# Patient Record
Sex: Male | Born: 1935
Health system: Southern US, Community
[De-identification: ages and names within clinical notes are randomized; demographics above are authoritative.]

## PROBLEM LIST (undated history)

## (undated) DIAGNOSIS — B029 Zoster without complications: Secondary | ICD-10-CM

## (undated) DIAGNOSIS — I4891 Unspecified atrial fibrillation: Secondary | ICD-10-CM

## (undated) DIAGNOSIS — E119 Type 2 diabetes mellitus without complications: Secondary | ICD-10-CM

## (undated) DIAGNOSIS — N289 Disorder of kidney and ureter, unspecified: Secondary | ICD-10-CM

## (undated) DIAGNOSIS — E7800 Pure hypercholesterolemia, unspecified: Secondary | ICD-10-CM

## (undated) DIAGNOSIS — E11319 Type 2 diabetes mellitus with unspecified diabetic retinopathy without macular edema: Secondary | ICD-10-CM

## (undated) DIAGNOSIS — H35039 Hypertensive retinopathy, unspecified eye: Secondary | ICD-10-CM

## (undated) DIAGNOSIS — I484 Atypical atrial flutter: Secondary | ICD-10-CM

## (undated) DIAGNOSIS — Z8546 Personal history of malignant neoplasm of prostate: Secondary | ICD-10-CM

## (undated) DIAGNOSIS — I10 Essential (primary) hypertension: Secondary | ICD-10-CM

## (undated) HISTORY — DX: Hypertensive retinopathy, unspecified eye: H35.039

## (undated) HISTORY — PX: EYE SURGERY: SHX253

## (undated) HISTORY — DX: Essential (primary) hypertension: I10

## (undated) HISTORY — PX: OTHER SURGICAL HISTORY: SHX169

## (undated) HISTORY — DX: Type 2 diabetes mellitus with unspecified diabetic retinopathy without macular edema: E11.319

## (undated) HISTORY — PX: CATARACT EXTRACTION: SUR2

## (undated) HISTORY — DX: Zoster without complications: B02.9

## (undated) HISTORY — DX: Disorder of kidney and ureter, unspecified: N28.9

## (undated) HISTORY — DX: Pure hypercholesterolemia, unspecified: E78.00

## (undated) HISTORY — DX: Personal history of malignant neoplasm of prostate: Z85.46

## (undated) HISTORY — DX: Type 2 diabetes mellitus without complications: E11.9

## (undated) NOTE — *Deleted (*Deleted)
[facility] - Clinic Note  06/14/2020     CHIEF COMPLAINT Patient presents for No chief complaint on file.   HISTORY OF PRESENT ILLNESS: Marcus Johnston is a 35 y.o. male who presents to the clinic today for:   Patient states he still has floaters, but they are slowly getting better, he states he has a "string" in his vision and the color white looks tan, he states he has not been able to read any text since he had the steroid injection   Referring physician: Dorothyann Peng, MD [address] [place],  [place] [address]  HISTORICAL INFORMATION:   Selected notes from the MEDICAL RECORD NUMBER Referred by Dr. Fabian Sharp for CME OU   CURRENT MEDICATIONS: Current Outpatient Medications (Ophthalmic Drugs)  Medication Sig  . Bromfenac Sodium (PROLENSA) 0.07 % SOLN Place 1 drop into both eyes 4 (four) times daily.  . prednisoLONE acetate (PRED FORTE) 1 % ophthalmic suspension Place 1 drop into both eyes 4 (four) times daily.   No current facility-administered medications for this visit. (Ophthalmic Drugs)   Current Outpatient Medications (Other)  Medication Sig  . acetaminophen (TYLENOL) 325 MG tablet Take 650 mg by mouth every 6 (six) hours as needed for mild pain or fever.  Marland Kitchen amLODipine (NORVASC) 5 MG tablet Take 1 tablet by mouth 2 times per day  . carvedilol (COREG) 12.5 MG tablet Take 1 tablet (12.5 mg total) by mouth 2 (two) times daily with a meal.  . Cholecalciferol (VITAMIN D3) 25 MCG (1000 UT) CAPS Take 1,000 Units by mouth daily.   Marland Kitchen doxazosin (CARDURA) 2 MG tablet Take 2 mg by mouth daily. (Patient not taking: Reported on 04/26/2020)  . doxazosin (CARDURA) 4 MG tablet TAKE 1 TABLET BY MOUTH AT  BEDTIME  . Empagliflozin (JARDIANCE PO) Take 25 mg by mouth once. 1 Tablet Daily with Breakfast  . FARXIGA 5 MG TABS tablet Take 5 mg by mouth daily.  . fexofenadine (ALLEGRA) 180 MG tablet Take 180 mg by mouth daily as needed (allergies.).   Marland Kitchen  insulin lispro protamine-lispro (HUMALOG 50/50 MIX) (50-50) 100 UNIT/ML SUSP injection Inject 22-25 Units into the skin See admin instructions. Inject 25 units subcutaneously with breakfast and 22 units with supper  . Insulin Syringe-Needle U-100 (EASY TOUCH INSULIN SYRINGE) 30G X 5/16" 0.5 ML MISC USE 3 TIMES DAILY  . LANTUS 100 UNIT/ML injection Inject into the skin.  Marland Kitchen losartan (COZAAR) 50 MG tablet   . losartan-hydrochlorothiazide (HYZAAR) 100-25 MG tablet TAKE 1 TABLET BY MOUTH  DAILY  . metFORMIN (GLUCOPHAGE-XR) 500 MG 24 hr tablet Take 1,000 mg by mouth 2 (two) times daily with a meal.   . NOVOLOG 100 UNIT/ML injection Inject into the skin.  . TRULICITY 0.75 MG/0.5ML SOPN SMARTSIG:0.5 Milliliter(s) SUB-Q Once a Week   No current facility-administered medications for this visit. (Other)      REVIEW OF SYSTEMS:    ALLERGIES Allergies  Allergen Reactions  . Bidil [Isosorb Dinitrate-Hydralazine] Other (See Comments)    Severe hypotension episode after administering one dose of Bidil 20-37.5 mg on 09/25/2019.   . Statins Other (See Comments)    Leg pain    PAST MEDICAL HISTORY Past Medical History:  Diagnosis Date  . Atrial fibrillation (HCC)   . Atypical atrial flutter (HCC) 10/17/2018  . Diabetes mellitus without complication (HCC)   . Diabetic retinopathy (HCC)    NPDR OU  . High cholesterol   . History of prostate  cancer   . HTN (hypertension)   . Hypertensive retinopathy    OU  . Renal insufficiency   . Shingles    Past Surgical History:  Procedure Laterality Date  . ATRIAL TACH ABLATION N/A 05/08/2019   Procedure: ATRIAL TACH ABLATION;  Surgeon: Marinus Maw, MD;  Location: [facility];  Service: Cardiovascular;  Laterality: N/A;  . CATARACT EXTRACTION Bilateral   . EYE SURGERY Bilateral    Cat Sx  . prostate implant      FAMILY HISTORY Family History  Problem Relation Age of Onset  . Cancer Mother   . Cancer Father     SOCIAL HISTORY Social  History   Tobacco Use  . Smoking status: Never Smoker  . Smokeless tobacco: Never Used  Vaping Use  . Vaping Use: Never used  Substance Use Topics  . Alcohol use: No  . Drug use: No         OPHTHALMIC EXAM:  Not recorded     IMAGING AND PROCEDURES  Imaging and Procedures for @TODAY @           ASSESSMENT/PLAN:    ICD-10-CM   1. Moderate nonproliferative diabetic retinopathy of both eyes with macular edema associated with type 2 diabetes mellitus (HCC)  Z61.0960   2. Retinal edema  H35.81 OCT, Retina - OU - Both Eyes  3. History of retinal detachment  Z86.69   4. Essential hypertension  I10   5. Hypertensive retinopathy of both eyes  H35.033   6. Pseudophakia of both eyes  Z96.1    1,2.  Non-proliferative diabetic retinopathy w/ macular edema OU  - patient with long standing history of macular edema -- was seen by Dr. Ashley Royalty back in 2013  - has also seen Dr. Allyne Gee, Dr. Allena Katz, Retina service at [facility]  - last retina f/u in 2019 with Dr. Allena Katz and at [facility]  - pt has previously received intravitreal injections OU, but does not like them and felt like benefits from injections were minmal  - at initial visit, was using PF and ketorolac QID OU -- pt self d/c  - exam shows scattered/minimal MA but central edema OU  - FA (12.11.20) shows late leaking MA, OS shows capillary nonperfusion, OD with ?petaloid central hyperfluorescence -- ?CME component  - s/p IVA OD #1 (12.11.20)  - s/p IVE OD #1 (01.20.21), #2 (02.17.21), #3 (03.17.21), #4 (05.13.21), #5 (06.11.21), #6 (07.14.21), #7 (08.13.21)  - s/p IVE OS #1 (12.11.20), #2 (01.20.21), #3 (02.17.21), #4 (03.17.21), #5 (04.15.21), #6 (4.15.21), #7 (06.11.21), #8 (07.14.21), #9 (08.13.21), #10 (9.10.21)             - s/p IVTA OD #1 (04.15.21), #2 (9.10.21)  - s/p IVTA OS #1 (05.13.21)  - good response to IVTA previously, but most recent IVTA OD (9.10.21) caused significant floaters and increased central edema  -  BCVA OD: 20/40 (improved); OS: 20/60 (decreased)  - OCT shows OD: Mild Interval improvement in vit opacities but increased central cystic changes; OS: persistent IRF/IRHM  - recommend IVE OU (OD #8 and OS #11) today, 10.29.21  - pt wishes to defer injections today and wait for floaters to clear up before resuming injections  - Eylea4U benefits investigation initiated 12.11.20 -- approved for 2021  - Eylea informed consent form signed and scanned on 01.20.21  - IVTA informed consent form signed and scanned on 04.15.21  - cont PF QID OU and Prolensa QID OU  - f/u 2-3 weeks --  DFE/OCT/possible injection  3. History of inferior HST w/ focal RD OD s/p laser retinopexy  - HST at 0500 w/ surrounding SRF  - good barricade laser in place spanning 0430-0600  - stable  4,5. Hypertensive retinopathy OU  - discussed importance of tight BP control  - monitor   6. Pseudophakia OU  - s/p CE/IOL OU  - beautiful surgeries, doing well  - monitor   Ophthalmic Meds Ordered this visit:  No orders of the defined types were placed in this encounter.      No follow-ups on file.  There are no Patient Instructions on file for this visit.   Explained the diagnoses, plan, and follow up with the patient and they expressed understanding.  Patient expressed understanding of the importance of proper follow up care.   This document serves as a record of services personally performed by Karie Chimera, MD, PhD. It was created on their behalf by Glee Arvin. Manson Passey, OA an ophthalmic technician. The creation of this record is the provider's dictation and/or activities during the visit.    Electronically signed by: Glee Arvin. Manson Passey, [place] 10.25.2021 12:06 PM  Karie Chimera, M.D., Ph.D. Diseases & Surgery of the Retina and Vitreous [facility]    Abbreviations: M myopia (nearsighted); A astigmatism; H hyperopia (farsighted); P presbyopia; Mrx spectacle prescription;  CTL contact lenses;  OD right eye; OS left eye; OU both eyes  XT exotropia; ET esotropia; PEK punctate epithelial keratitis; PEE punctate epithelial erosions; DES dry eye syndrome; MGD meibomian gland dysfunction; ATs artificial tears; PFAT's preservative free artificial tears; NSC nuclear sclerotic cataract; PSC posterior subcapsular cataract; ERM epi-retinal membrane; PVD posterior vitreous detachment; RD retinal detachment; DM diabetes mellitus; DR diabetic retinopathy; NPDR non-proliferative diabetic retinopathy; PDR proliferative diabetic retinopathy; CSME clinically significant macular edema; DME diabetic macular edema; dbh dot blot hemorrhages; CWS cotton wool spot; POAG primary open angle glaucoma; C/D cup-to-disc ratio; HVF humphrey visual field; GVF goldmann visual field; OCT optical coherence tomography; IOP intraocular pressure; BRVO Branch retinal vein occlusion; CRVO central retinal vein occlusion; CRAO central retinal artery occlusion; BRAO branch retinal artery occlusion; RT retinal tear; SB scleral buckle; PPV pars plana vitrectomy; VH Vitreous hemorrhage; PRP panretinal laser photocoagulation; IVK intravitreal kenalog; VMT vitreomacular traction; MH Macular hole;  NVD neovascularization of the disc; NVE neovascularization elsewhere; AREDS age related eye disease study; ARMD age related macular degeneration; POAG primary open angle glaucoma; EBMD epithelial/anterior basement membrane dystrophy; ACIOL anterior chamber intraocular lens; IOL intraocular lens; PCIOL posterior chamber intraocular lens; Phaco/IOL phacoemulsification with intraocular lens placement; PRK photorefractive keratectomy; LASIK laser assisted in situ keratomileusis; HTN hypertension; DM diabetes mellitus; COPD chronic obstructive pulmonary disease

---
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
## 2005-06-24 ENCOUNTER — Encounter: Admission: RE | Admit: 2005-06-24 | Discharge: 2005-06-24 | Payer: Self-pay | Admitting: Orthopedic Surgery

## 2005-06-29 ENCOUNTER — Ambulatory Visit (HOSPITAL_BASED_OUTPATIENT_CLINIC_OR_DEPARTMENT_OTHER): Admission: RE | Admit: 2005-06-29 | Discharge: 2005-06-29 | Payer: Self-pay | Admitting: Orthopedic Surgery

## 2005-06-29 ENCOUNTER — Ambulatory Visit (HOSPITAL_COMMUNITY): Admission: RE | Admit: 2005-06-29 | Discharge: 2005-06-29 | Payer: Self-pay | Admitting: Orthopedic Surgery

## 2007-10-11 ENCOUNTER — Encounter: Admission: RE | Admit: 2007-10-11 | Discharge: 2007-10-11 | Payer: Self-pay | Admitting: Internal Medicine

## 2011-01-02 NOTE — Op Note (Signed)
Marcus Johnston, Marcus Johnston              ACCOUNT NO.:  [id number]   MEDICAL RECORD NO.:  [id number]          PATIENT TYPE:  AMB   LOCATION:  [facility]                          FACILITY:  [facility]   PHYSICIAN:  Feliberto Gottron. Turner Daniels, M.D.   DATE OF BIRTH:  08-18-1930   DATE OF PROCEDURE:  06/29/2005  DATE OF DISCHARGE:                                 OPERATIVE REPORT   PREOPERATIVE DIAGNOSES:  Right knee medial meniscal tear, possible  chondromalacia.   POSTOPERATIVE DIAGNOSES:  Right knee posterior horn, parrot-beak medial  meniscal tear, chondromalacia patella grade 3, medial femoral condyle grade  3 and cartilaginous loose bodies.   OPERATION PERFORMED:  Right knee arthroscopic debridement of medial meniscal  tear, chondromalacia patella, chondromalacia medial femoral condyle and  removal of loose bodies.   SURGEON:  Feliberto Gottron. Turner Daniels, M.D.   ASSISTANTLaural Benes. Jannet Mantis.   ANESTHESIA:  General LMA.   ESTIMATED BLOOD LOSS:  Minimal.   FLUIDS REPLACED:  800 mL crystalloids.   DRAINS:  None.   TOURNIQUET TIME:  None.   INDICATIONS FOR PROCEDURE:  The patient is a 77 year old man with catching,  popping and pain in his right knee consistent with medial meniscal tear and  of course possible chondromalacia in his age group.  He has failed  conservative treatment with observation, anti-inflammatory medicines, and  exercises and desires elective arthroscopic evaluation and treatment of his  right knee.  Plain radiographs show loss of only 1 or 2 mm of the medial  cartilage and risks and benefits of surgery discussed with him, all  questions answered preoperatively.   DESCRIPTION OF PROCEDURE:  The patient was identified by arm band and taken  to the operating room at [facility] where the appropriate  anesthetic monitors were attached.  General LMA anesthesia was induced with  the patient in the supine position.  Lateral post applied to the table and  the right lower  extremity prepped and draped in the usual sterile fashion  from the ankle to the mid thigh.  Using an 11 blade, standard inferomedial  and inferolateral peripatellar portals were then made allowing introduction  of the arthroscope through the inferolateral portal and the outflow through  the inferomedial portal.  Diagnostic arthroscopy revealed chondromalacia of  the patella, grade 3 which was debrided back to a stable margin with a 3.5  Gator sucker shaver along the apex.  Going to the medial compartment, parrot-  beak tear of the posterior horn of the medial meniscus was identified along  with a horizontal cleavage tear and this was debrided back to a stable  margin using the straight biters and a 3.5 Gator sucker shaver.  Grade 3  chondromalacia of the lateral aspect of the medial femoral condyle  anteriorly and posteriorly was also debrided back to stable margin with flap  tears.  The ACL and PCL were intact, the lateral compartment was in  excellent condition. The gutters were cleared medially and laterally and the  scope taken to the medial side of the PCL clearing the posterior compartment  as well.  Multiple cartilaginous loose bodies were encountered from the  chondromalacia donor sites during the procedure and taken out primarily with  the outflow although some required use of a biter or grasper to remove.  The  knee was then irrigated out with normal saline solution.  The arthroscopic  instruments removed and a dressing of Xeroform, 4 x 4 dressing sponges,  Webril and Ace wrap was applied.  The patient was awakened and taken to the  recovery room without difficulty.      Feliberto Gottron. Turner Daniels, M.D.  Electronically Signed     FJR/MEDQ  D:  06/29/2005  T:  06/30/2005  Job:  [id number]

## 2011-11-26 ENCOUNTER — Ambulatory Visit (INDEPENDENT_AMBULATORY_CARE_PROVIDER_SITE_OTHER): Payer: Medicare Other | Admitting: Ophthalmology

## 2011-11-26 DIAGNOSIS — H43819 Vitreous degeneration, unspecified eye: Secondary | ICD-10-CM

## 2011-11-26 DIAGNOSIS — E11319 Type 2 diabetes mellitus with unspecified diabetic retinopathy without macular edema: Secondary | ICD-10-CM

## 2011-11-26 DIAGNOSIS — E1139 Type 2 diabetes mellitus with other diabetic ophthalmic complication: Secondary | ICD-10-CM

## 2011-11-26 DIAGNOSIS — H2510 Age-related nuclear cataract, unspecified eye: Secondary | ICD-10-CM

## 2011-12-03 ENCOUNTER — Encounter (INDEPENDENT_AMBULATORY_CARE_PROVIDER_SITE_OTHER): Payer: Medicare Other | Admitting: Ophthalmology

## 2013-11-14 ENCOUNTER — Ambulatory Visit: Payer: Self-pay

## 2013-11-14 ENCOUNTER — Ambulatory Visit (INDEPENDENT_AMBULATORY_CARE_PROVIDER_SITE_OTHER): Payer: Medicare Other

## 2013-11-14 VITALS — BP 162/79 | HR 70 | Resp 12

## 2013-11-14 DIAGNOSIS — E1142 Type 2 diabetes mellitus with diabetic polyneuropathy: Secondary | ICD-10-CM

## 2013-11-14 DIAGNOSIS — E1149 Type 2 diabetes mellitus with other diabetic neurological complication: Secondary | ICD-10-CM

## 2013-11-14 DIAGNOSIS — E1140 Type 2 diabetes mellitus with diabetic neuropathy, unspecified: Secondary | ICD-10-CM

## 2013-11-14 DIAGNOSIS — B351 Tinea unguium: Secondary | ICD-10-CM

## 2013-11-14 DIAGNOSIS — B353 Tinea pedis: Secondary | ICD-10-CM

## 2013-11-14 DIAGNOSIS — M79609 Pain in unspecified limb: Secondary | ICD-10-CM

## 2013-11-14 NOTE — Progress Notes (Signed)
   Subjective:    Patient ID: Marcus Johnston, male    DOB: 11-May-1931, 80 y.o.   MRN: [id number]  HPI  TOENAILS NEE TO BE TRIM. ALSO CHECK DIABETIC FEET AND NO PAIN.   Review of Systems  All other systems reviewed and are negative.       Objective:   Physical Exam Vascular status is intact with pedal pulses palpable DP postal for PT plus one over 4 bilateral capillary refill time 3 seconds all digits skin temperature warm turgor normal there is no edema noted no rubor pallor or varicosities noted neurologic epicritic and proprioceptive sensations are intact although diminished decreased sensation plantar arch and rear foot into the plantar first MTP area there is intact sensation of toes and dorsum of the foot. There is normal plantar response DTRs not elicited dermatologically skin color pigment normal hair growth absent nails criptotic incurvated and friable with slight yellowing discoloration and proptosis. There is also some mild interdigital fissuring and maceration patient does have a complaint of perspiration is be staying wet and hyperhidrosis. No secondary infections no ascending psoas lymphangitis no open wounds or ulcerations noted. Orthopedic biomechanical exam reveals rectus foot type mild digital contractures noted hallux is rectus Last A1c was just under 9 they're changing his medications and hopefully can have a better A1c in the future.      Assessment & Plan:  Assessment this time his diabetes with early peripheral neuropathy and some onychomycosis and proptosis of nails as well as tinea pedis plan at this time mycotic nails are debrided and the presence of diabetes cocking factors and some pain in symptomology also this time dispensed samples of Luzu topical antifungal is dispensed proxy 30 tumors or dispensed for patient applied once daily to the affected interspace areas both feet for Lisa to 3 week duration. Also suggested topical antiperspirant spray can spread his feet  up reduce perspiration stop using nylon socks is cotton or Kerlix socks. Dispensed literature and diabetic foot care management at this time nails are trimmed antifungal dispensed reappointed 3 months for continued palliative care in the future. Next  Harriet Masson DPM

## 2013-11-14 NOTE — Patient Instructions (Signed)
Diabetes and Foot Care Diabetes may cause you to have problems because of poor blood supply (circulation) to your feet and legs. This may cause the skin on your feet to become thinner, break easier, and heal more slowly. Your skin may become dry, and the skin may peel and crack. You may also have nerve damage in your legs and feet causing decreased feeling in them. You may not notice minor injuries to your feet that could lead to infections or more serious problems. Taking care of your feet is one of the most important things you can do for yourself.  HOME CARE INSTRUCTIONS  Wear shoes at all times, even in the house. Do not go barefoot. Bare feet are easily injured.  Check your feet daily for blisters, cuts, and redness. If you cannot see the bottom of your feet, use a mirror or ask someone for help.  Wash your feet with warm water (do not use hot water) and mild soap. Then pat your feet and the areas between your toes until they are completely dry. Do not soak your feet as this can dry your skin.  Apply a moisturizing lotion or petroleum jelly (that does not contain alcohol and is unscented) to the skin on your feet and to dry, brittle toenails. Do not apply lotion between your toes.  Trim your toenails straight across. Do not dig under them or around the cuticle. File the edges of your nails with an emery board or nail file.  Do not cut corns or calluses or try to remove them with medicine.  Wear clean socks or stockings every day. Make sure they are not too tight. Do not wear knee-high stockings since they may decrease blood flow to your legs.  Wear shoes that fit properly and have enough cushioning. To break in new shoes, wear them for just a few hours a day. This prevents you from injuring your feet. Always look in your shoes before you put them on to be sure there are no objects inside.  Do not cross your legs. This may decrease the blood flow to your feet.  If you find a minor scrape,  cut, or break in the skin on your feet, keep it and the skin around it clean and dry. These areas may be cleansed with mild soap and water. Do not cleanse the area with peroxide, alcohol, or iodine.  When you remove an adhesive bandage, be sure not to damage the skin around it.  If you have a wound, look at it several times a day to make sure it is healing.  Do not use heating pads or hot water bottles. They may burn your skin. If you have lost feeling in your feet or legs, you may not know it is happening until it is too late.  Make sure your health care provider performs a complete foot exam at least annually or more often if you have foot problems. Report any cuts, sores, or bruises to your health care provider immediately. SEEK MEDICAL CARE IF:   You have an injury that is not healing.  You have cuts or breaks in the skin.  You have an ingrown nail.  You notice redness on your legs or feet.  You feel burning or tingling in your legs or feet.  You have pain or cramps in your legs and feet.  Your legs or feet are numb.  Your feet always feel cold. SEEK IMMEDIATE MEDICAL CARE IF:   There is increasing redness,   swelling, or pain in or around a wound.  There is a red line that goes up your leg.  Pus is coming from a wound.  You develop a fever or as directed by your health care provider.  You notice a bad smell coming from an ulcer or wound. Document Released: 07/31/2000 Document Revised: 04/05/2013 Document Reviewed: 01/10/2013 ExitCare Patient Information 2014 [employer].  

## 2014-02-13 ENCOUNTER — Ambulatory Visit (INDEPENDENT_AMBULATORY_CARE_PROVIDER_SITE_OTHER): Payer: Medicare Other

## 2014-02-13 VITALS — BP 124/71 | HR 71 | Resp 16

## 2014-02-13 DIAGNOSIS — E1149 Type 2 diabetes mellitus with other diabetic neurological complication: Secondary | ICD-10-CM

## 2014-02-13 DIAGNOSIS — E1140 Type 2 diabetes mellitus with diabetic neuropathy, unspecified: Secondary | ICD-10-CM

## 2014-02-13 DIAGNOSIS — B353 Tinea pedis: Secondary | ICD-10-CM

## 2014-02-13 DIAGNOSIS — B351 Tinea unguium: Secondary | ICD-10-CM

## 2014-02-13 DIAGNOSIS — E1142 Type 2 diabetes mellitus with diabetic polyneuropathy: Secondary | ICD-10-CM

## 2014-02-13 NOTE — Patient Instructions (Signed)
Diabetes and Foot Care Diabetes may cause you to have problems because of poor blood supply (circulation) to your feet and legs. This may cause the skin on your feet to become thinner, break easier, and heal more slowly. Your skin may become dry, and the skin may peel and crack. You may also have nerve damage in your legs and feet causing decreased feeling in them. You may not notice minor injuries to your feet that could lead to infections or more serious problems. Taking care of your feet is one of the most important things you can do for yourself.  HOME CARE INSTRUCTIONS  Wear shoes at all times, even in the house. Do not go barefoot. Bare feet are easily injured.  Check your feet daily for blisters, cuts, and redness. If you cannot see the bottom of your feet, use a mirror or ask someone for help.  Wash your feet with warm water (do not use hot water) and mild soap. Then pat your feet and the areas between your toes until they are completely dry. Do not soak your feet as this can dry your skin.  Apply a moisturizing lotion or petroleum jelly (that does not contain alcohol and is unscented) to the skin on your feet and to dry, brittle toenails. Do not apply lotion between your toes.  Trim your toenails straight across. Do not dig under them or around the cuticle. File the edges of your nails with an emery board or nail file.  Do not cut corns or calluses or try to remove them with medicine.  Wear clean socks or stockings every day. Make sure they are not too tight. Do not wear knee-high stockings since they may decrease blood flow to your legs.  Wear shoes that fit properly and have enough cushioning. To break in new shoes, wear them for just a few hours a day. This prevents you from injuring your feet. Always look in your shoes before you put them on to be sure there are no objects inside.  Do not cross your legs. This may decrease the blood flow to your feet.  If you find a minor scrape,  cut, or break in the skin on your feet, keep it and the skin around it clean and dry. These areas may be cleansed with mild soap and water. Do not cleanse the area with peroxide, alcohol, or iodine.  When you remove an adhesive bandage, be sure not to damage the skin around it.  If you have a wound, look at it several times a day to make sure it is healing.  Do not use heating pads or hot water bottles. They may burn your skin. If you have lost feeling in your feet or legs, you may not know it is happening until it is too late.  Make sure your health care provider performs a complete foot exam at least annually or more often if you have foot problems. Report any cuts, sores, or bruises to your health care provider immediately. SEEK MEDICAL CARE IF:   You have an injury that is not healing.  You have cuts or breaks in the skin.  You have an ingrown nail.  You notice redness on your legs or feet.  You feel burning or tingling in your legs or feet.  You have pain or cramps in your legs and feet.  Your legs or feet are numb.  Your feet always feel cold. SEEK IMMEDIATE MEDICAL CARE IF:   There is increasing redness,   swelling, or pain in or around a wound.  There is a red line that goes up your leg.  Pus is coming from a wound.  You develop a fever or as directed by your health care provider.  You notice a bad smell coming from an ulcer or wound. Document Released: 07/31/2000 Document Revised: 04/05/2013 Document Reviewed: 01/10/2013 ExitCare Patient Information 2015 [employer]. This information is not intended to replace advice given to you by your health care provider. Make sure you discuss any questions you have with your health care provider.  

## 2014-02-13 NOTE — Progress Notes (Signed)
   Subjective:    Patient ID: Marcus Johnston, male    DOB: 06/16/1931, 80 y.o.   MRN: [id number]  HPI Comments: "I don't want my nails trimmed today, just want them checked please"  Last A1C was 8.2  Diabetes      Review of Systems no new findings or systemic changes noted     Objective:   Physical Exam 80 year old [place] male process this time for followup diabetic foot evaluation patient Marcus Johnston stable cut his own nails Nail care treatment at this time using the topical antifungal for the interdigital maceration and has fungal infection appears to be improved there is no open wounds ulcerations noted patient's pulses follow DP postal for bilateral PT one over 4 bilateral capillary refill timed somewhat decreased 3-4 seconds epicritic and proprioceptive sensations diminished on Semmes Weinstein testing to forefoot and digits but 3 sensations also noted to be decreased. Mild flexible digital contractures noted no open wounds ulcerations no other complications noted current time.       Assessment & Plan:  Assessment diabetes with mild peripheral neuropathy no other consultations or factors noted patient has elected to do self-care debridement of nails suggested future followup in 6-12 months for diabetic foot you've reevaluation and neuropathy check maintain good appropriate accommodative shoes at all times. Followup there's any new problems exacerbations ulcer or changes in his status next  Marcus Johnston DPM

## 2015-02-12 ENCOUNTER — Ambulatory Visit: Payer: Medicare Other

## 2015-02-12 ENCOUNTER — Ambulatory Visit: Payer: Self-pay | Admitting: Podiatry

## 2015-02-19 ENCOUNTER — Ambulatory Visit (INDEPENDENT_AMBULATORY_CARE_PROVIDER_SITE_OTHER): Payer: Commercial Managed Care - HMO | Admitting: Podiatry

## 2015-03-26 ENCOUNTER — Ambulatory Visit (INDEPENDENT_AMBULATORY_CARE_PROVIDER_SITE_OTHER): Payer: Commercial Managed Care - HMO | Admitting: Podiatry

## 2015-03-26 ENCOUNTER — Encounter: Payer: Self-pay | Admitting: Podiatry

## 2015-03-26 DIAGNOSIS — M79676 Pain in unspecified toe(s): Secondary | ICD-10-CM | POA: Diagnosis not present

## 2015-03-26 DIAGNOSIS — B351 Tinea unguium: Secondary | ICD-10-CM | POA: Diagnosis not present

## 2015-03-26 NOTE — Progress Notes (Signed)
Patient ID: Marcus Johnston, male   DOB: 1930-12-24, 81 y.o.   MRN: [id number] Complaint:  Visit Type: Patient returns to my office for continued preventative foot care services. Complaint: Patient states" my nails have grown long and thick and become painful to walk and wear shoes" Patient has been diagnosed with DM with no foot complications. The patient presents for preventative foot care services. No changes to ROS  Podiatric Exam: Vascular: dorsalis pedis and posterior tibial pulses are palpable bilateral. Capillary return is immediate. Temperature gradient is WNL. Skin turgor WNL  Sensorium: Normal Semmes Weinstein monofilament test. Normal tactile sensation bilaterally. Nail Exam: Pt has thick disfigured discolored nails with subungual debris noted bilateral entire nail hallux through fifth toenails Ulcer Exam: There is no evidence of ulcer or pre-ulcerative changes or infection. Orthopedic Exam: Muscle tone and strength are WNL. No limitations in general ROM. No crepitus or effusions noted. Foot type and digits show no abnormalities. Bony prominences are unremarkable. Skin: No Porokeratosis. No infection or ulcers  Diagnosis:  Onychomycosis, , Pain in right toe, pain in left toes  Treatment & Plan Procedures and Treatment: Consent by patient was obtained for treatment procedures. The patient understood the discussion of treatment and procedures well. All questions were answered thoroughly reviewed. Debridement of mycotic and hypertrophic toenails, 1 through 5 bilateral and clearing of subungual debris. No ulceration, no infection noted.  Return Visit-Office Procedure: Patient instructed to return to the office for a follow up visit 3 months for continued evaluation and treatment.

## 2015-08-28 DIAGNOSIS — I129 Hypertensive chronic kidney disease with stage 1 through stage 4 chronic kidney disease, or unspecified chronic kidney disease: Secondary | ICD-10-CM | POA: Diagnosis not present

## 2015-08-28 DIAGNOSIS — N182 Chronic kidney disease, stage 2 (mild): Secondary | ICD-10-CM | POA: Diagnosis not present

## 2015-08-28 DIAGNOSIS — R0602 Shortness of breath: Secondary | ICD-10-CM | POA: Diagnosis not present

## 2015-08-28 DIAGNOSIS — R05 Cough: Secondary | ICD-10-CM | POA: Diagnosis not present

## 2015-09-13 DIAGNOSIS — J22 Unspecified acute lower respiratory infection: Secondary | ICD-10-CM | POA: Diagnosis not present

## 2015-09-13 DIAGNOSIS — N182 Chronic kidney disease, stage 2 (mild): Secondary | ICD-10-CM | POA: Diagnosis not present

## 2015-09-13 DIAGNOSIS — I129 Hypertensive chronic kidney disease with stage 1 through stage 4 chronic kidney disease, or unspecified chronic kidney disease: Secondary | ICD-10-CM | POA: Diagnosis not present

## 2015-09-18 DIAGNOSIS — E1165 Type 2 diabetes mellitus with hyperglycemia: Secondary | ICD-10-CM | POA: Diagnosis not present

## 2015-09-30 DIAGNOSIS — N182 Chronic kidney disease, stage 2 (mild): Secondary | ICD-10-CM | POA: Diagnosis not present

## 2015-09-30 DIAGNOSIS — I129 Hypertensive chronic kidney disease with stage 1 through stage 4 chronic kidney disease, or unspecified chronic kidney disease: Secondary | ICD-10-CM | POA: Diagnosis not present

## 2015-09-30 DIAGNOSIS — Z79899 Other long term (current) drug therapy: Secondary | ICD-10-CM | POA: Diagnosis not present

## 2015-09-30 DIAGNOSIS — J309 Allergic rhinitis, unspecified: Secondary | ICD-10-CM | POA: Diagnosis not present

## 2015-11-06 DIAGNOSIS — Z5181 Encounter for therapeutic drug level monitoring: Secondary | ICD-10-CM | POA: Diagnosis not present

## 2015-11-06 DIAGNOSIS — Z79899 Other long term (current) drug therapy: Secondary | ICD-10-CM | POA: Diagnosis not present

## 2015-11-06 DIAGNOSIS — E1122 Type 2 diabetes mellitus with diabetic chronic kidney disease: Secondary | ICD-10-CM | POA: Diagnosis not present

## 2015-11-06 DIAGNOSIS — E1165 Type 2 diabetes mellitus with hyperglycemia: Secondary | ICD-10-CM | POA: Diagnosis not present

## 2015-11-06 DIAGNOSIS — N183 Chronic kidney disease, stage 3 (moderate): Secondary | ICD-10-CM | POA: Diagnosis not present

## 2015-11-06 DIAGNOSIS — Z794 Long term (current) use of insulin: Secondary | ICD-10-CM | POA: Diagnosis not present

## 2015-11-26 DIAGNOSIS — Z0000 Encounter for general adult medical examination without abnormal findings: Secondary | ICD-10-CM | POA: Diagnosis not present

## 2015-11-26 DIAGNOSIS — N08 Glomerular disorders in diseases classified elsewhere: Secondary | ICD-10-CM | POA: Diagnosis not present

## 2015-11-26 DIAGNOSIS — E1122 Type 2 diabetes mellitus with diabetic chronic kidney disease: Secondary | ICD-10-CM | POA: Diagnosis not present

## 2015-11-26 DIAGNOSIS — N182 Chronic kidney disease, stage 2 (mild): Secondary | ICD-10-CM | POA: Diagnosis not present

## 2015-11-26 DIAGNOSIS — I129 Hypertensive chronic kidney disease with stage 1 through stage 4 chronic kidney disease, or unspecified chronic kidney disease: Secondary | ICD-10-CM | POA: Diagnosis not present

## 2015-12-09 DIAGNOSIS — E113212 Type 2 diabetes mellitus with mild nonproliferative diabetic retinopathy with macular edema, left eye: Secondary | ICD-10-CM | POA: Diagnosis not present

## 2015-12-11 DIAGNOSIS — E1165 Type 2 diabetes mellitus with hyperglycemia: Secondary | ICD-10-CM | POA: Diagnosis not present

## 2016-01-06 DIAGNOSIS — C61 Malignant neoplasm of prostate: Secondary | ICD-10-CM | POA: Diagnosis not present

## 2016-01-06 DIAGNOSIS — Z0000 Encounter for general adult medical examination without abnormal findings: Secondary | ICD-10-CM | POA: Diagnosis not present

## 2016-01-06 DIAGNOSIS — E113212 Type 2 diabetes mellitus with mild nonproliferative diabetic retinopathy with macular edema, left eye: Secondary | ICD-10-CM | POA: Diagnosis not present

## 2016-02-10 DIAGNOSIS — E113212 Type 2 diabetes mellitus with mild nonproliferative diabetic retinopathy with macular edema, left eye: Secondary | ICD-10-CM | POA: Diagnosis not present

## 2016-03-11 DIAGNOSIS — E1165 Type 2 diabetes mellitus with hyperglycemia: Secondary | ICD-10-CM | POA: Diagnosis not present

## 2016-03-23 DIAGNOSIS — E113212 Type 2 diabetes mellitus with mild nonproliferative diabetic retinopathy with macular edema, left eye: Secondary | ICD-10-CM | POA: Diagnosis not present

## 2016-05-04 DIAGNOSIS — E113212 Type 2 diabetes mellitus with mild nonproliferative diabetic retinopathy with macular edema, left eye: Secondary | ICD-10-CM | POA: Diagnosis not present

## 2016-05-12 DIAGNOSIS — Z23 Encounter for immunization: Secondary | ICD-10-CM | POA: Diagnosis not present

## 2016-05-12 DIAGNOSIS — Z5181 Encounter for therapeutic drug level monitoring: Secondary | ICD-10-CM | POA: Diagnosis not present

## 2016-05-12 DIAGNOSIS — N183 Chronic kidney disease, stage 3 (moderate): Secondary | ICD-10-CM | POA: Diagnosis not present

## 2016-05-12 DIAGNOSIS — Z794 Long term (current) use of insulin: Secondary | ICD-10-CM | POA: Diagnosis not present

## 2016-05-12 DIAGNOSIS — E1165 Type 2 diabetes mellitus with hyperglycemia: Secondary | ICD-10-CM | POA: Diagnosis not present

## 2016-05-12 DIAGNOSIS — E1122 Type 2 diabetes mellitus with diabetic chronic kidney disease: Secondary | ICD-10-CM | POA: Diagnosis not present

## 2016-05-18 DIAGNOSIS — N182 Chronic kidney disease, stage 2 (mild): Secondary | ICD-10-CM | POA: Diagnosis not present

## 2016-05-18 DIAGNOSIS — J309 Allergic rhinitis, unspecified: Secondary | ICD-10-CM | POA: Diagnosis not present

## 2016-05-18 DIAGNOSIS — Z79899 Other long term (current) drug therapy: Secondary | ICD-10-CM | POA: Diagnosis not present

## 2016-05-18 DIAGNOSIS — I129 Hypertensive chronic kidney disease with stage 1 through stage 4 chronic kidney disease, or unspecified chronic kidney disease: Secondary | ICD-10-CM | POA: Diagnosis not present

## 2016-06-08 DIAGNOSIS — E113212 Type 2 diabetes mellitus with mild nonproliferative diabetic retinopathy with macular edema, left eye: Secondary | ICD-10-CM | POA: Diagnosis not present

## 2016-06-11 DIAGNOSIS — E1165 Type 2 diabetes mellitus with hyperglycemia: Secondary | ICD-10-CM | POA: Diagnosis not present

## 2016-09-08 DIAGNOSIS — Z79899 Other long term (current) drug therapy: Secondary | ICD-10-CM | POA: Diagnosis not present

## 2016-09-08 DIAGNOSIS — Z794 Long term (current) use of insulin: Secondary | ICD-10-CM | POA: Diagnosis not present

## 2016-09-08 DIAGNOSIS — N183 Chronic kidney disease, stage 3 (moderate): Secondary | ICD-10-CM | POA: Diagnosis not present

## 2016-09-08 DIAGNOSIS — E1122 Type 2 diabetes mellitus with diabetic chronic kidney disease: Secondary | ICD-10-CM | POA: Diagnosis not present

## 2016-09-11 DIAGNOSIS — E1165 Type 2 diabetes mellitus with hyperglycemia: Secondary | ICD-10-CM | POA: Diagnosis not present

## 2016-10-19 DIAGNOSIS — N183 Chronic kidney disease, stage 3 (moderate): Secondary | ICD-10-CM | POA: Diagnosis not present

## 2016-10-19 DIAGNOSIS — R42 Dizziness and giddiness: Secondary | ICD-10-CM | POA: Diagnosis not present

## 2016-10-19 DIAGNOSIS — Z5181 Encounter for therapeutic drug level monitoring: Secondary | ICD-10-CM | POA: Diagnosis not present

## 2016-10-19 DIAGNOSIS — Z794 Long term (current) use of insulin: Secondary | ICD-10-CM | POA: Diagnosis not present

## 2016-10-19 DIAGNOSIS — E1122 Type 2 diabetes mellitus with diabetic chronic kidney disease: Secondary | ICD-10-CM | POA: Diagnosis not present

## 2016-11-11 DIAGNOSIS — N183 Chronic kidney disease, stage 3 (moderate): Secondary | ICD-10-CM | POA: Diagnosis not present

## 2016-11-11 DIAGNOSIS — E1122 Type 2 diabetes mellitus with diabetic chronic kidney disease: Secondary | ICD-10-CM | POA: Diagnosis not present

## 2016-11-11 DIAGNOSIS — Z794 Long term (current) use of insulin: Secondary | ICD-10-CM | POA: Diagnosis not present

## 2016-11-11 DIAGNOSIS — I10 Essential (primary) hypertension: Secondary | ICD-10-CM | POA: Diagnosis not present

## 2016-11-11 DIAGNOSIS — Z5181 Encounter for therapeutic drug level monitoring: Secondary | ICD-10-CM | POA: Diagnosis not present

## 2016-11-12 ENCOUNTER — Encounter (HOSPITAL_COMMUNITY): Payer: Self-pay

## 2016-11-12 ENCOUNTER — Observation Stay (HOSPITAL_COMMUNITY)
Admission: EM | Admit: 2016-11-12 | Discharge: 2016-11-14 | Disposition: A | Discharge status: Home / Self Care | Payer: Medicare Other | Attending: Internal Medicine | Admitting: Internal Medicine

## 2016-11-12 ENCOUNTER — Emergency Department (HOSPITAL_COMMUNITY): Payer: Medicare Other

## 2016-11-12 ENCOUNTER — Observation Stay (HOSPITAL_COMMUNITY): Payer: Medicare Other

## 2016-11-12 DIAGNOSIS — Z7982 Long term (current) use of aspirin: Secondary | ICD-10-CM | POA: Insufficient documentation

## 2016-11-12 DIAGNOSIS — Z79899 Other long term (current) drug therapy: Secondary | ICD-10-CM | POA: Diagnosis not present

## 2016-11-12 DIAGNOSIS — E785 Hyperlipidemia, unspecified: Secondary | ICD-10-CM | POA: Diagnosis not present

## 2016-11-12 DIAGNOSIS — I10 Essential (primary) hypertension: Secondary | ICD-10-CM

## 2016-11-12 DIAGNOSIS — R51 Headache: Secondary | ICD-10-CM | POA: Diagnosis not present

## 2016-11-12 DIAGNOSIS — Z794 Long term (current) use of insulin: Secondary | ICD-10-CM | POA: Insufficient documentation

## 2016-11-12 DIAGNOSIS — G459 Transient cerebral ischemic attack, unspecified: Secondary | ICD-10-CM | POA: Diagnosis not present

## 2016-11-12 DIAGNOSIS — G458 Other transient cerebral ischemic attacks and related syndromes: Secondary | ICD-10-CM

## 2016-11-12 DIAGNOSIS — Z888 Allergy status to other drugs, medicaments and biological substances status: Secondary | ICD-10-CM | POA: Insufficient documentation

## 2016-11-12 DIAGNOSIS — E119 Type 2 diabetes mellitus without complications: Secondary | ICD-10-CM

## 2016-11-12 DIAGNOSIS — E1165 Type 2 diabetes mellitus with hyperglycemia: Secondary | ICD-10-CM | POA: Insufficient documentation

## 2016-11-12 DIAGNOSIS — R29810 Facial weakness: Secondary | ICD-10-CM | POA: Diagnosis not present

## 2016-11-12 DIAGNOSIS — N289 Disorder of kidney and ureter, unspecified: Secondary | ICD-10-CM | POA: Diagnosis not present

## 2016-11-12 DIAGNOSIS — R4781 Slurred speech: Secondary | ICD-10-CM | POA: Diagnosis not present

## 2016-11-12 DIAGNOSIS — R739 Hyperglycemia, unspecified: Secondary | ICD-10-CM

## 2016-11-12 LAB — CREATININE, SERUM
CREATININE: 1.13 mg/dL (ref 0.61–1.24)
GFR calc Af Amer: >60 mL/min (ref >60)
GFR, EST NON AFRICAN AMERICAN: 58 mL/min — AB (ref >60)

## 2016-11-12 LAB — I-STAT CHEM 8, ED
BUN: 21 mg/dL — ABNORMAL HIGH (ref 6–20)
CHLORIDE: 103 mmol/L (ref 101–111)
Calcium, Ion: 1.17 mmol/L (ref 1.15–1.40)
Creatinine, Ser: 1.4 mg/dL — ABNORMAL HIGH (ref 0.61–1.24)
Glucose, Bld: 179 mg/dL — ABNORMAL HIGH (ref 65–99)
HCT: 42 % (ref 39.0–52.0)
Hemoglobin: 14.3 g/dL (ref 13.0–17.0)
Potassium: 4 mmol/L (ref 3.5–5.1)
SODIUM: 143 mmol/L (ref 135–145)
TCO2: 30 mmol/L (ref 0–100)

## 2016-11-12 LAB — PROTIME-INR
INR: 0.96
Prothrombin Time: 12.8 seconds (ref 11.4–15.2)

## 2016-11-12 LAB — APTT: aPTT: 30 seconds (ref 24–36)

## 2016-11-12 LAB — I-STAT TROPONIN, ED: Troponin i, poc: 0 ng/mL (ref 0.00–0.08)

## 2016-11-12 LAB — GLUCOSE, CAPILLARY: Glucose-Capillary: 203 mg/dL — ABNORMAL HIGH (ref 65–99)

## 2016-11-12 MED ORDER — LOSARTAN POTASSIUM 50 MG PO TABS
100.0000 mg | ORAL_TABLET | Freq: Every day | ORAL | Status: DC
  Administered 2016-11-12 – 2016-11-14 (×3): 100 mg via ORAL
  Filled 2016-11-12 (×3): qty 2

## 2016-11-12 MED ORDER — POLYETHYLENE GLYCOL 3350 17 G PO PACK: 17.0000 g | PACK | Freq: Every day | ORAL | Status: DC | PRN

## 2016-11-12 MED ORDER — INSULIN ASPART PROT & ASPART (70-30 MIX) 100 UNIT/ML ~~LOC~~ SUSP
24.0000 [IU] | Freq: Two times a day (BID) | SUBCUTANEOUS | Status: DC
  Administered 2016-11-12 – 2016-11-14 (×4): 24 [IU] via SUBCUTANEOUS
  Filled 2016-11-12: qty 10

## 2016-11-12 MED ORDER — AMLODIPINE BESYLATE 5 MG PO TABS
5.0000 mg | ORAL_TABLET | Freq: Every day | ORAL | Status: DC
  Administered 2016-11-12 – 2016-11-14 (×3): 5 mg via ORAL
  Filled 2016-11-12 (×3): qty 1

## 2016-11-12 MED ORDER — VITAMIN D 1000 UNITS PO TABS
1000.0000 [IU] | ORAL_TABLET | Freq: Every day | ORAL | Status: DC
  Administered 2016-11-12 – 2016-11-14 (×2): 1000 [IU] via ORAL
  Filled 2016-11-12 (×3): qty 1

## 2016-11-12 MED ORDER — ONDANSETRON HCL 4 MG PO TABS: 4.0000 mg | ORAL_TABLET | Freq: Four times a day (QID) | ORAL | Status: DC | PRN

## 2016-11-12 MED ORDER — INSULIN ASPART 100 UNIT/ML ~~LOC~~ SOLN
0.0000 [IU] | Freq: Three times a day (TID) | SUBCUTANEOUS | Status: DC
  Administered 2016-11-12 – 2016-11-13 (×2): 3 [IU] via SUBCUTANEOUS
  Administered 2016-11-13: 1 [IU] via SUBCUTANEOUS
  Administered 2016-11-13: 2 [IU] via SUBCUTANEOUS
  Administered 2016-11-14: 1 [IU] via SUBCUTANEOUS
  Administered 2016-11-14: 2 [IU] via SUBCUTANEOUS

## 2016-11-12 MED ORDER — HEPARIN SODIUM (PORCINE) 5000 UNIT/ML IJ SOLN
5000.0000 [IU] | Freq: Three times a day (TID) | INTRAMUSCULAR | Status: DC
  Administered 2016-11-12 – 2016-11-14 (×7): 5000 [IU] via SUBCUTANEOUS
  Filled 2016-11-12 (×7): qty 1

## 2016-11-12 MED ORDER — ACETAMINOPHEN 325 MG PO TABS: 650.0000 mg | ORAL_TABLET | Freq: Four times a day (QID) | ORAL | Status: DC | PRN

## 2016-11-12 MED ORDER — STROKE: EARLY STAGES OF RECOVERY BOOK
Freq: Once | Status: AC
  Administered 2016-11-12: 17:00:00
  Filled 2016-11-12: qty 1

## 2016-11-12 MED ORDER — LOSARTAN POTASSIUM 50 MG PO TABS
100.0000 mg | ORAL_TABLET | Freq: Once | ORAL | Status: AC
  Administered 2016-11-12: 100 mg via ORAL
  Filled 2016-11-12 (×2): qty 2

## 2016-11-12 MED ORDER — LOSARTAN POTASSIUM-HCTZ 100-25 MG PO TABS: 1.0000 | ORAL_TABLET | Freq: Every day | ORAL | Status: DC

## 2016-11-12 MED ORDER — HYDROCHLOROTHIAZIDE 25 MG PO TABS
25.0000 mg | ORAL_TABLET | Freq: Every day | ORAL | Status: DC
  Administered 2016-11-12 – 2016-11-14 (×3): 25 mg via ORAL
  Filled 2016-11-12 (×3): qty 1

## 2016-11-12 MED ORDER — ASPIRIN EC 81 MG PO TBEC
81.0000 mg | DELAYED_RELEASE_TABLET | Freq: Every day | ORAL | Status: DC
  Administered 2016-11-12 – 2016-11-13 (×2): 81 mg via ORAL
  Filled 2016-11-12 (×2): qty 1

## 2016-11-12 MED ORDER — ONDANSETRON HCL 4 MG/2ML IJ SOLN: 4.0000 mg | Freq: Four times a day (QID) | INTRAMUSCULAR | Status: DC | PRN

## 2016-11-12 MED ORDER — DOXAZOSIN MESYLATE 4 MG PO TABS
4.0000 mg | ORAL_TABLET | Freq: Every day | ORAL | Status: DC
  Administered 2016-11-12 – 2016-11-14 (×3): 4 mg via ORAL
  Filled 2016-11-12 (×3): qty 1

## 2016-11-12 MED ORDER — INSULIN LISPRO PROT & LISPRO (50-50 MIX) 100 UNIT/ML KWIKPEN: 24.0000 [IU] | PEN_INJECTOR | Freq: Two times a day (BID) | SUBCUTANEOUS | Status: DC

## 2016-11-12 MED ORDER — TRAZODONE HCL 50 MG PO TABS
25.0000 mg | ORAL_TABLET | Freq: Every evening | ORAL | Status: DC | PRN
  Filled 2016-11-12: qty 1

## 2016-11-12 MED ORDER — ACETAMINOPHEN 650 MG RE SUPP: 650.0000 mg | Freq: Four times a day (QID) | RECTAL | Status: DC | PRN

## 2016-11-12 NOTE — H&P (Signed)
History and Physical    Marcus Johnston CVE:[id number] DOB: 11/14/1930 DOA: 11/12/2016  PCP: [name], MD   Patient coming from: home  Chief Complaint: facial droop, slurred speech  HPI: Marcus Johnston is a 83 y.o. male with medical history significant of HTN, Hyperlipidemia, DM type II who presented to the ED with c/o aphasia and slurred speech, left facial droop and some fine movements of the right hand.  The episode of slurred speech occurred around 7 PM yesterday and lasted approximately 5 minutes. Soon after patient developed severe headache. He checked his blood pressure and reported that systolic was running in 025E.  He went to bed thinking that in the morning he would feel better, but awoke with the headache and 1 AM and blood pressure still remained elevated  At that time he noted that his right arm has some tingling. He woke up his wife and asked her to go to the ED. By the time he arrived to the ED slurred speech and right arm tingling resolved but patient still had a headache.  ED Course: In the ED his blood pressure remained elevated at 210/98 mmHg, otherwise vital signs were stable. He received a dose of Cozaar and blood pressure improved to 163/72 mmHg. patient not underwent head CT that showed no acute intracranial abnormality, patchy bilateral cerebral white matter hypodensity probably associated with chronic small vessel disease. Blood work showed BUN 21 and creatinine 1.40, glucose 179 EKG without acute abnormality   Review of Systems: As per HPI otherwise 10 point review of systems negative.   Ambulatory Status: Independent  Past Medical History:  Diagnosis Date  . Diabetes mellitus without complication ([place])     History reviewed. No pertinent surgical history.  Social History   Social History  . Marital status: Married    Spouse name: N/A  . Number of children: N/A  . Years of education: N/A   Occupational History  . Not on file.   Social  History Main Topics  . Smoking status: Never Smoker  . Smokeless tobacco: Never Used  . Alcohol use No  . Drug use: No  . Sexual activity: Not on file   Other Topics Concern  . Not on file   Social History Narrative  . No narrative on file    Allergies  Allergen Reactions  . Statins Other (See Comments)    pain    No family history on file.  Prior to Admission medications   Medication Sig Start Date End Date Taking? Authorizing Provider  amLODipine (NORVASC) 5 MG tablet Take 5 mg by mouth daily.   Yes Historical Provider, MD  aspirin EC 81 MG tablet Take 81 mg by mouth daily.   Yes Historical Provider, MD  cholecalciferol (VITAMIN D) 1000 units tablet Take 1,000 Units by mouth daily.   Yes Historical Provider, MD  doxazosin (CARDURA) 4 MG tablet Take 4 mg by mouth daily.  11/11/13  Yes Historical Provider, MD  fexofenadine (ALLEGRA) 180 MG tablet Take 180 mg by mouth daily.   Yes Historical Provider, MD  HUMALOG MIX 50/50 KWIKPEN (50-50) 100 UNIT/ML Kwikpen Inject 24 Units into the skin 2 (two) times daily.  09/20/13  Yes Historical Provider, MD  losartan-hydrochlorothiazide (HYZAAR) 100-25 MG per tablet Take 1 tablet by mouth daily.  11/11/13  Yes Historical Provider, MD  metFORMIN (GLUCOPHAGE-XR) 500 MG 24 hr tablet Take 2,000 mg by mouth every evening.   Yes Historical Provider, MD    Physical Exam:  Vitals:   11/12/16 1000 11/12/16 1030 11/12/16 1100 11/12/16 1130  BP: (!) 159/102 (!) 168/82 (!) 177/81 (!) 166/75  Pulse: 70 74 62 78  Resp: 15 (!) 23 15 15   Temp:      TempSrc:      SpO2: 98% 100% 95% 96%     General: Appears calm and comfortable Eyes: PERRLA, EOMI, normal lids, iris ENT:  grossly normal hearing, lips & tongue, mucous membranes moist and intact Neck: no lymphoadenopathy, masses or thyromegaly Cardiovascular: RRR, no m/r/g. No JVD, carotid bruits. Mild LE edema noted.   Respiratory: bilateral no wheezes, rales, rhonchi or cracles. Normal respiratory  effort. No accessory muscle use observed Abdomen: soft, non-tender, non-distended, no organomegaly or masses appreciated. BS present in all quadrants Skin: no rash, ulcers or induration seen on limited exam Musculoskeletal: grossly normal tone BUE/BLE, good ROM, no bony abnormality or joint deformities observed Psychiatric: grossly normal mood and affect, speech fluent and appropriate, alert and oriented x3 Neurologic: CN II-XII grossly intact, moves all extremities in coordinated fashion, sensation intact  Labs on Admission: I have personally reviewed following labs and imaging studies  CBC, BMP  GFR: CrCl cannot be calculated (Unknown ideal weight.).   Creatinine Clearance: CrCl cannot be calculated (Unknown ideal weight.).    Radiological Exams on Admission: Ct Head Wo Contrast  Result Date: 11/12/2016 CLINICAL DATA:  83 year old male with sudden onset slurred speech this morning. Right side headache. Initial encounter. EXAM: CT HEAD WITHOUT CONTRAST TECHNIQUE: Contiguous axial images were obtained from the base of the skull through the vertex without intravenous contrast. COMPARISON:  None. FINDINGS: Brain: Cerebral volume is within normal limits for age. No midline shift, ventriculomegaly, mass effect, intracranial hemorrhage or evidence of cortically based acute infarction. Patchy bilateral cerebral white matter hypodensity. No cortical encephalomalacia. At the posterior vertex there is an area of increased dural thickening and calcification (series 5, image 53), but no definite dural mass. Vascular: No suspicious intracranial vascular hyperdensity. Intracranial atherosclerosis suspected. Skull: Negative.  No acute osseous abnormality identified. Sinuses/Orbits: Clear. Other: Postoperative changes to the globes, otherwise negative orbits soft tissues. No acute scalp soft tissue finding. IMPRESSION: 1.  No acute intracranial abnormality identified. 2. Patchy bilateral cerebral white  matter hypodensity, most commonly due to chronic small vessel disease. 3. Posterior vertex increased dural thickening and calcification without definite dural based mass. Electronically Signed   By: Genevie Ann M.D.   On: 11/12/2016 08:49    EKG: Independently reviewed -SR, NS ST-TW changes  Assessment/Plan Principal Problem:   TIA (transient ischemic attack) Active Problems:   HTN (hypertension)   DM (diabetes mellitus) ([place])   Renal insufficiency    TIA She was seen by neurology, recommended proceed with MRI/MRA of the brain, check hemoglobin A1c, lipid profile The symptoms resolved, continue to keep on telemetry and monitor symptoms  HTN - poor control presentation Continue Norvasc and Hyzaar and adjust doses if needed  DM type II Hold metformin, continue Humalog and sliding scale insulin, monitor CBGs before every meal C and daily at bedtime, continue carb modified diet  Renal insufficiency - it is unclear acute kidney injury versus exacerbation of underlying chronic kidney disease as we do not have any other creatinine for comparison   DVT prophylaxis: Heparin  Code Status: Full Family Communication: None Disposition Plan: Telemetry Consults called: Neurology Admission status: Observation   Caprice Red Pager: [phone] [facility]  If 7PM-7AM, please contact night-coverage [email or web address] Password [email or web address]  11/12/2016, 12:46 PM

## 2016-11-12 NOTE — ED Notes (Signed)
Pt returns from ct scan. Placed back on tele.

## 2016-11-12 NOTE — ED Notes (Signed)
Attempted report 

## 2016-11-12 NOTE — ED Triage Notes (Signed)
Pt reports having slurred speech, left sided facial droop, and right temporal headache last night at about 1900 that resolved soon after. HE then went to bed and couldn't sleep d/t headache. PT had no neuro deficits in triage.

## 2016-11-12 NOTE — ED Provider Notes (Signed)
[facility] DEPT Provider Note   CSN: [id number] Arrival date & time: 11/12/16  0206     History   Chief Complaint Chief Complaint  Patient presents with  . Transient Ischemic Attack    HPI Marcus Johnston is a 83 y.o. male.  Patient with hx htn, dm, c/o acute onset slurred speech and left facial droop while sitting at dinner table last night. Felt fine prior. States also did develop a mild-mod right sided headache. No hx chronic headaches or migraines. Symptoms lasted for approx 5-10 minutes and resolved. States the only other thing he noticed was unusual difficulty undoing his belt/waist, but denies any numbness/weakness. No problems w gait, walking or coordination. No change in vision. This AM feels back to normal, except v mild headache. No prior hx tia or cva. Is compliant w home meds. States he was taken off amlodipine as his doctor is trying to find right dose/combination of bp meds.    The history is provided by the patient and the spouse.    Past Medical History:  Diagnosis Date  . Diabetes mellitus without complication ([place])     There are no active problems to display for this patient.   History reviewed. No pertinent surgical history.     Home Medications    Prior to Admission medications   Medication Sig Start Date End Date Taking? Authorizing Provider  amLODipine (NORVASC) 5 MG tablet Take 5 mg by mouth daily.   Yes Historical Provider, MD  aspirin EC 81 MG tablet Take 81 mg by mouth daily.   Yes Historical Provider, MD  cholecalciferol (VITAMIN D) 1000 units tablet Take 1,000 Units by mouth daily.   Yes Historical Provider, MD  doxazosin (CARDURA) 4 MG tablet Take 4 mg by mouth daily.  11/11/13  Yes Historical Provider, MD  fexofenadine (ALLEGRA) 180 MG tablet Take 180 mg by mouth daily.   Yes Historical Provider, MD  HUMALOG MIX 50/50 KWIKPEN (50-50) 100 UNIT/ML Kwikpen Inject 24 Units into the skin 2 (two) times daily.  09/20/13  Yes Historical Provider,  MD  losartan-hydrochlorothiazide (HYZAAR) 100-25 MG per tablet Take 1 tablet by mouth daily.  11/11/13  Yes Historical Provider, MD  metFORMIN (GLUCOPHAGE-XR) 500 MG 24 hr tablet Take 2,000 mg by mouth every evening.   Yes Historical Provider, MD    Family History No family history on file.  Social History Social History  Substance Use Topics  . Smoking status: Never Smoker  . Smokeless tobacco: Never Used  . Alcohol use No     Allergies   Statins   Review of Systems Review of Systems  Constitutional: Negative for fever.  HENT: Negative for sore throat.   Eyes: Negative for visual disturbance.  Respiratory: Negative for shortness of breath.   Cardiovascular: Negative for chest pain.  Gastrointestinal: Negative for abdominal pain and vomiting.  Genitourinary: Negative for flank pain.  Musculoskeletal: Negative for back pain and neck pain.  Skin: Negative for rash.  Neurological: Positive for facial asymmetry, speech difficulty and headaches.  Hematological: Does not bruise/bleed easily.  Psychiatric/Behavioral: Negative for confusion.     Physical Exam Updated Vital Signs BP (!) 190/93   Pulse 85   Temp 97.3 F (36.3 C) (Oral)   Resp 18   SpO2 98%   Physical Exam  Constitutional: He is oriented to person, place, and time. He appears well-developed and well-nourished. No distress.  HENT:  Mouth/Throat: Oropharynx is clear and moist.  No sinus or temporal tenderness  Eyes:  Conjunctivae and EOM are normal. Pupils are equal, round, and reactive to light.  Neck: Neck supple. No tracheal deviation present.  No bruits  Cardiovascular: Normal rate, regular rhythm, normal heart sounds and intact distal pulses.  Exam reveals no gallop and no friction rub.   No murmur heard. Pulmonary/Chest: Effort normal and breath sounds normal. No accessory muscle usage. No respiratory distress.  Abdominal: Soft. Bowel sounds are normal. He exhibits no distension. There is no  tenderness.  Musculoskeletal: He exhibits no edema.  Neurological: He is alert and oriented to person, place, and time. No cranial nerve deficit.  Speech clear/fluent. No aphasia. Motor intact bil, stre 5/5. sens grossly intact. Steady gait.   Skin: Skin is warm and dry. He is not diaphoretic.  Psychiatric: He has a normal mood and affect.  Nursing note and vitals reviewed.    ED Treatments / Results  Labs (all labs ordered are listed, but only abnormal results are displayed) Results for orders placed or performed during the hospital encounter of 11/12/16  Protime-INR  Result Value Ref Range   Prothrombin Time 12.8 11.4 - 15.2 seconds   INR 0.96   APTT  Result Value Ref Range   aPTT 30 24 - 36 seconds  I-Stat Troponin, ED (not at [facility])  Result Value Ref Range   Troponin i, poc 0.00 0.00 - 0.08 ng/mL   Comment 3          I-Stat Chem 8, ED  Result Value Ref Range   Sodium 143 135 - 145 mmol/L   Potassium 4.0 3.5 - 5.1 mmol/L   Chloride 103 101 - 111 mmol/L   BUN 21 (H) 6 - 20 mg/dL   Creatinine, Ser 1.40 (H) 0.61 - 1.24 mg/dL   Glucose, Bld 179 (H) 65 - 99 mg/dL   Calcium, Ion 1.17 1.15 - 1.40 mmol/L   TCO2 30 0 - 100 mmol/L   Hemoglobin 14.3 13.0 - 17.0 g/dL   HCT 42.0 39.0 - 52.0 %   Ct Head Wo Contrast  Result Date: 11/12/2016 CLINICAL DATA:  83 year old male with sudden onset slurred speech this morning. Right side headache. Initial encounter. EXAM: CT HEAD WITHOUT CONTRAST TECHNIQUE: Contiguous axial images were obtained from the base of the skull through the vertex without intravenous contrast. COMPARISON:  None. FINDINGS: Brain: Cerebral volume is within normal limits for age. No midline shift, ventriculomegaly, mass effect, intracranial hemorrhage or evidence of cortically based acute infarction. Patchy bilateral cerebral white matter hypodensity. No cortical encephalomalacia. At the posterior vertex there is an area of increased dural thickening and calcification (series  5, image 53), but no definite dural mass. Vascular: No suspicious intracranial vascular hyperdensity. Intracranial atherosclerosis suspected. Skull: Negative.  No acute osseous abnormality identified. Sinuses/Orbits: Clear. Other: Postoperative changes to the globes, otherwise negative orbits soft tissues. No acute scalp soft tissue finding. IMPRESSION: 1.  No acute intracranial abnormality identified. 2. Patchy bilateral cerebral white matter hypodensity, most commonly due to chronic small vessel disease. 3. Posterior vertex increased dural thickening and calcification without definite dural based mass. Electronically Signed   By: Genevie Ann M.D.   On: 11/12/2016 08:49    EKG  EKG Interpretation  Date/Time:  Thursday November 12 2016 08:10:23 EDT Ventricular Rate:  80 PR Interval:    QRS Duration: 96 QT Interval:  406 QTC Calculation: 469 R Axis:   -141 Text Interpretation:  Sinus rhythm Nonspecific T wave abnormality Confirmed by Ashok Cordia  MD, Lennette Bihari ([id number]) on 11/12/2016 8:14:05 AM  Also confirmed by [facility]  MD, [facility] ([id number]), editor [address], Leda Gauze [phone])  on 11/12/2016 8:16:13 AM       Radiology No results found.  Procedures Procedures (including critical care time)  Medications Ordered in ED Medications  losartan (COZAAR) tablet 100 mg (100 mg Oral Given 11/12/16 0656)     Initial Impression / Assessment and Plan / ED Course  I have reviewed the triage vital signs and the nursing notes.  Pertinent labs & imaging results that were available during my care of the patient were reviewed by me and considered in my medical decision making (see chart for details).  Iv ns. Labs.   Ct scan.  Bp high.  Pt given dose of his home meds.   Neurology consulted - spoke with Dr Ernie Hew - he will see, recommends admit to medical service.  Discussed with Hospitalists, they will admit.  Recheck pt, symptoms resolved.  Will admit for tia/cva workup.     Final Clinical Impressions(s) /  ED Diagnoses   Final diagnoses:  None    New Prescriptions New Prescriptions   No medications on file     Lajean Saver, MD 11/12/16 1050

## 2016-11-12 NOTE — Discharge Planning (Signed)
Pt wife states she will go home for a few hours and return later.

## 2016-11-12 NOTE — ED Notes (Signed)
Upon assessment pt reports headache and reports that he had a 5 minute episode of left sided facial droop and slurred speech. Pt BP systolic in the 142 . Spoke with provider and received order for pt morning BP meds.

## 2016-11-12 NOTE — ED Notes (Signed)
Larger cuff was used to retake BP

## 2016-11-12 NOTE — Consult Note (Addendum)
Requesting Physician: Dr. Ashok Cordia    Chief Complaint: TIA  History obtained from:  Patient     HPI:                                                                                                                                         Marcus Johnston is an 83 y.o. male with known diabetes, cholesterol, hypertension who has never had a TIA or stroke in the past. Patient states that he had a sudden onset of slurred speech last night that occurred around 7 PM. This lasted for approximately 5 minutes. Patient noted that his blood pressure which usually runs approximately 366/29 was up systolically in the 476L. Patient also had a headache at that time. They did not seek any medical attention at that time. Patient awoke with a headache at 1:00 this morning and again today his blood pressure noting it was elevated. He also noted that his right hand has some tingling chest fully resolved at this time. For this reason he was brought to the emergency department by his wife. Patient slurred speech and his right hand tingling is fully resolved however he still has a mild headache. Blood pressure has improved in his systolically in the 465K. Patient states he does take a baby aspirin every day and has not missed a dose. States his last A1c was approximately 8.0. And states he has good cholesterol but does not know the number.  Date last known well: Date: 11/11/2016 Time last known well: Time: 19:00 tPA Given: No: out of window and symptoms resolved.    Past Medical History:  Diagnosis Date  . Diabetes mellitus without complication ([place])     History reviewed. No pertinent surgical history.  No family history on file. Social History:  reports that he has never smoked. He has never used smokeless tobacco. He reports that he does not drink alcohol or use drugs.  Allergies:  Allergies  Allergen Reactions  . Statins Other (See Comments)    pain    Medications:                                                                                                                            No current facility-administered medications for this encounter.    Current Outpatient Prescriptions  Medication Sig Dispense Refill  . amLODipine (NORVASC) 5 MG tablet  Take 5 mg by mouth daily.    Marland Kitchen aspirin EC 81 MG tablet Take 81 mg by mouth daily.    . cholecalciferol (VITAMIN D) 1000 units tablet Take 1,000 Units by mouth daily.    Marland Kitchen doxazosin (CARDURA) 4 MG tablet Take 4 mg by mouth daily.     . fexofenadine (ALLEGRA) 180 MG tablet Take 180 mg by mouth daily.    Marland Kitchen HUMALOG MIX 50/50 KWIKPEN (50-50) 100 UNIT/ML Kwikpen Inject 24 Units into the skin 2 (two) times daily.     Marland Kitchen losartan-hydrochlorothiazide (HYZAAR) 100-25 MG per tablet Take 1 tablet by mouth daily.     . metFORMIN (GLUCOPHAGE-XR) 500 MG 24 hr tablet Take 2,000 mg by mouth every evening.       ROS:                                                                                                                                       History obtained from the patient  General ROS: negative for - chills, fatigue, fever, night sweats, weight gain or weight loss Psychological ROS: negative for - behavioral disorder, hallucinations, memory difficulties, mood swings or suicidal ideation Ophthalmic ROS: negative for - blurry vision, double vision, eye pain or loss of vision ENT ROS: negative for - epistaxis, nasal discharge, oral lesions, sore throat, tinnitus or vertigo Allergy and Immunology ROS: negative for - hives or itchy/watery eyes Hematological and Lymphatic ROS: negative for - bleeding problems, bruising or swollen lymph nodes Endocrine ROS: negative for - galactorrhea, hair pattern changes, polydipsia/polyuria or temperature intolerance Respiratory ROS: negative for - cough, hemoptysis, shortness of breath or wheezing Cardiovascular ROS: negative for - chest pain, dyspnea on exertion, edema or irregular heartbeat Gastrointestinal ROS: negative  for - abdominal pain, diarrhea, hematemesis, nausea/vomiting or stool incontinence Genito-Urinary ROS: negative for - dysuria, hematuria, incontinence or urinary frequency/urgency Musculoskeletal ROS: negative for - joint swelling or muscular weakness Neurological ROS: as noted in HPI Dermatological ROS: negative for rash and skin lesion changes  Neurologic Examination:                                                                                                      Blood pressure (!) 163/72, pulse 65, temperature 97.3 F (36.3 C), temperature source Oral, resp. rate 17, SpO2 94 %.  HEENT-  Normocephalic, no lesions, without obvious abnormality.  Normal external eye and conjunctiva.  Normal TM's bilaterally.  Normal auditory canals and external ears. Normal external nose,  mucus membranes and septum.  Normal pharynx. Cardiovascular- S1, S2 normal, pulses palpable throughout   Lungs- chest clear, no wheezing, rales, normal symmetric air entry Abdomen- normal findings: bowel sounds normal Extremities- no edema Lymph-no adenopathy palpable Musculoskeletal-no joint tenderness, deformity or swelling Skin-warm and dry, no hyperpigmentation, vitiligo, or suspicious lesions  Neurological Examination Mental Status: Alert, oriented, thought content appropriate.  Speech fluent without evidence of aphasia.  Able to follow 3 step commands without difficulty. Cranial Nerves: II:  Visual fields grossly normal,  III,IV, VI: ptosis not present, extra-ocular motions intact bilaterally, pupils equal, round, reactive to light and accommodation V,VII: smile symmetric, facial light touch sensation normal bilaterally VIII: hearing normal bilaterally IX,X: uvula rises symmetrically XI: bilateral shoulder shrug XII: midline tongue extension Motor: Right : Upper extremity   5/5    Left:     Upper extremity   5/5  Lower extremity   5/5     Lower extremity   5/5 -R UE pronation drift Tone and bulk:normal  tone throughout; no atrophy noted Sensory: Pinprick and light touch intact throughout, bilaterally Deep Tendon Reflexes: 1+ and symmetric throughout Plantars: Right: downgoing   Left: downgoing Cerebellar: normal finger-to-nose,  and normal heel-to-shin test Gait: Not tested       Lab Results: Basic Metabolic Panel:  Recent Labs Lab 11/12/16 0307  NA 143  K 4.0  CL 103  GLUCOSE 179*  BUN 21*  CREATININE 1.40*    Liver Function Tests: No results for input(s): AST, ALT, ALKPHOS, BILITOT, PROT, ALBUMIN in the last 168 hours. No results for input(s): LIPASE, AMYLASE in the last 168 hours. No results for input(s): AMMONIA in the last 168 hours.  CBC:  Recent Labs Lab 11/12/16 0307  HGB 14.3  HCT 42.0    Cardiac Enzymes: No results for input(s): CKTOTAL, CKMB, CKMBINDEX, TROPONINI in the last 168 hours.  Lipid Panel: No results for input(s): CHOL, TRIG, HDL, CHOLHDL, VLDL, LDLCALC in the last 168 hours.  CBG: No results for input(s): GLUCAP in the last 168 hours.  Microbiology: No results found for this or any previous visit.  Coagulation Studies:  Recent Labs  11/12/16 0229  LABPROT 12.8  INR 0.96    Imaging: Ct Head Wo Contrast  Result Date: 11/12/2016 CLINICAL DATA:  83 year old male with sudden onset slurred speech this morning. Right side headache. Initial encounter. EXAM: CT HEAD WITHOUT CONTRAST TECHNIQUE: Contiguous axial images were obtained from the base of the skull through the vertex without intravenous contrast. COMPARISON:  None. FINDINGS: Brain: Cerebral volume is within normal limits for age. No midline shift, ventriculomegaly, mass effect, intracranial hemorrhage or evidence of cortically based acute infarction. Patchy bilateral cerebral white matter hypodensity. No cortical encephalomalacia. At the posterior vertex there is an area of increased dural thickening and calcification (series 5, image 53), but no definite dural mass. Vascular: No  suspicious intracranial vascular hyperdensity. Intracranial atherosclerosis suspected. Skull: Negative.  No acute osseous abnormality identified. Sinuses/Orbits: Clear. Other: Postoperative changes to the globes, otherwise negative orbits soft tissues. No acute scalp soft tissue finding. IMPRESSION: 1.  No acute intracranial abnormality identified. 2. Patchy bilateral cerebral white matter hypodensity, most commonly due to chronic small vessel disease. 3. Posterior vertex increased dural thickening and calcification without definite dural based mass. Electronically Signed   By: Genevie Ann M.D.   On: 11/12/2016 08:49       Assessment and plan discussed with with attending physician and they are in agreement.    Etta Quill  PA-C [facility] [phone]  11/12/2016, 10:50 AM   I have seen and examined the patient. He states that he had right facial weakness and when he was trying to button his trousers he noticed that he could not do it with his right hand. The symptoms have resolved.  Assessment: 83 y.o. male  presenting to the Hospital transient dysarthria and right hand numbness/weakness which is fully resolved. At this time consideration is TIA versus CVA.  Stroke Risk Factors - diabetes mellitus, hyperlipidemia and hypertension  Recommend: 1. HgbA1c, fasting lipid panel 2. MRI, MRA  of the brain without contrast 3. PT consult, OT consult, Speech consult 4. Echocardiogram 5. Carotid dopplers 6. Prophylactic therapy-Antiplatelet med: Aspirin - dose 325 mg daily 7. Risk factor modification 8. Telemetry monitoring 9. Frequent neuro checks 10 NPO until passes stroke swallow screen 11 please page stroke NP  Or  PA  Or MD from 8am -4 pm  as this patient from this time will be  followed by the stroke.   You can look them up on [email or web address]  Password [email or web address]   Roland Rack, MD [facility] [phone]  If 7pm- 7am, please page neurology on call as listed in  [place].

## 2016-11-12 NOTE — Evaluation (Signed)
Speech Language Pathology Evaluation Patient Details Name: Marcus Johnston MRN: [id number] DOB: 1931/07/08 Today's Date: 11/12/2016 Time: 9507-2257 SLP Time Calculation (min) (ACUTE ONLY): 14 min  Problem List:  Patient Active Problem List   Diagnosis Date Noted  . TIA (transient ischemic attack) 11/12/2016  . HTN (hypertension) 11/12/2016  . DM (diabetes mellitus) ([place]) 11/12/2016  . Renal insufficiency 11/12/2016   Past Medical History:  Past Medical History:  Diagnosis Date  . Diabetes mellitus without complication [facility])    Past Surgical History: History reviewed. No pertinent surgical history. HPI:  Pt is an 83 y.o.malewith h/o diabetes, cholesterol, and hypertension who presents with transient slurred speech and right hand parasthesia. CT Head negative for acute abnormalities, MRI pending.   Assessment / Plan / Recommendation Clinical Impression  Pt appears to be at his cognitive-linguistic baseline, and he has no subjective complaints at this time. His speech is clear and fluent at the conversational level. SLP to sign off.     SLP Assessment  SLP Recommendation/Assessment: Patient does not need any further Speech Lanaguage Pathology Services SLP Visit Diagnosis: Cognitive communication deficit (R41.841)    Follow Up Recommendations  None    Frequency and Duration           SLP Evaluation Cognition  Overall Cognitive Status: Within Functional Limits for tasks assessed Orientation Level: Oriented X4       Comprehension  Auditory Comprehension Overall Auditory Comprehension: Appears within functional limits for tasks assessed    Expression Expression Primary Mode of Expression: Verbal Verbal Expression Overall Verbal Expression: Appears within functional limits for tasks assessed   Oral / Motor  Motor Speech Overall Motor Speech: Appears within functional limits for tasks assessed   GO          Functional Assessment Tool Used: skilled clinical  judgment Functional Limitations: Attention Attention Current Status (D0518): At least 1 percent but less than 20 percent impaired, limited or restricted Attention Goal Status (Z3582): At least 1 percent but less than 20 percent impaired, limited or restricted Attention Discharge Status [phone]): At least 1 percent but less than 20 percent impaired, limited or restricted         Germain Osgood 11/12/2016, 3:28 PM  Germain Osgood, M.A. CCC-SLP [phone]

## 2016-11-12 NOTE — ED Notes (Signed)
Pt ambulatory to nurse first, pt updated on wait times and informed of delays, speech clear, resp e/u.

## 2016-11-12 NOTE — Progress Notes (Signed)
Patient admitted to [phone]. Patient is alert, oriented x4, NIHHS 0, denies pain, skin intact. Patient ambulated in room and in hallway with RN. Tele set up. RN explained plan of care. Will continue to monitor

## 2016-11-13 ENCOUNTER — Observation Stay (HOSPITAL_COMMUNITY): Payer: Medicare Other

## 2016-11-13 ENCOUNTER — Observation Stay (HOSPITAL_BASED_OUTPATIENT_CLINIC_OR_DEPARTMENT_OTHER): Payer: Medicare Other

## 2016-11-13 ENCOUNTER — Other Ambulatory Visit (HOSPITAL_COMMUNITY): Payer: Medicare Other

## 2016-11-13 DIAGNOSIS — G451 Carotid artery syndrome (hemispheric): Secondary | ICD-10-CM

## 2016-11-13 DIAGNOSIS — G454 Transient global amnesia: Secondary | ICD-10-CM

## 2016-11-13 DIAGNOSIS — E0800 Diabetes mellitus due to underlying condition with hyperosmolarity without nonketotic hyperglycemic-hyperosmolar coma (NKHHC): Secondary | ICD-10-CM | POA: Diagnosis not present

## 2016-11-13 DIAGNOSIS — E1159 Type 2 diabetes mellitus with other circulatory complications: Secondary | ICD-10-CM

## 2016-11-13 DIAGNOSIS — G459 Transient cerebral ischemic attack, unspecified: Secondary | ICD-10-CM

## 2016-11-13 DIAGNOSIS — I10 Essential (primary) hypertension: Secondary | ICD-10-CM

## 2016-11-13 DIAGNOSIS — R739 Hyperglycemia, unspecified: Secondary | ICD-10-CM | POA: Diagnosis not present

## 2016-11-13 DIAGNOSIS — N289 Disorder of kidney and ureter, unspecified: Secondary | ICD-10-CM | POA: Diagnosis not present

## 2016-11-13 LAB — GLUCOSE, CAPILLARY
GLUCOSE-CAPILLARY: 126 mg/dL — AB (ref 65–99)
GLUCOSE-CAPILLARY: 246 mg/dL — AB (ref 65–99)
Glucose-Capillary: 147 mg/dL — ABNORMAL HIGH (ref 65–99)
Glucose-Capillary: 53 mg/dL — ABNORMAL LOW (ref 65–99)
Glucose-Capillary: 155 mg/dL — ABNORMAL HIGH (ref 65–99)

## 2016-11-13 LAB — RAPID URINE DRUG SCREEN, HOSP PERFORMED
Amphetamines: NOT DETECTED
BARBITURATES: NOT DETECTED
Benzodiazepines: NOT DETECTED
COCAINE: NOT DETECTED
Opiates: NOT DETECTED
TETRAHYDROCANNABINOL: NOT DETECTED

## 2016-11-13 LAB — ECHOCARDIOGRAM COMPLETE
AVLVOTPG: 4 mmHg
CHL CUP DOP CALC LVOT VTI: 22.9 cm
CHL CUP MV DEC (S): 158
E decel time: 158 msec
E/e' ratio: 10.43
FS: 36 % (ref 28–44)
Height: 75 in
IV/PV OW: 1
LA diam end sys: 39 mm
LA diam index: 1.66 cm/m2
LA vol: 69.2 mL
LASIZE: 39 mm
LAVOLA4C: 65.5 mL
LAVOLIN: 29.4 mL/m2
LDCA: 3.8 cm2
LV E/e' medial: 10.43
LV E/e'average: 10.43
LV PW d: 10.5 mm — AB (ref 0.6–1.1)
LV TDI E'LATERAL: 5.98
LV TDI E'MEDIAL: 5.66
LV e' LATERAL: 5.98 cm/s
LVOTD: 22 mm
LVOTPV: 100 cm/s
LVOTSV: 87 mL
Lateral S' vel: 13.4 cm/s
MV pk A vel: 110 m/s
MV pk E vel: 62.4 m/s
TAPSE: 29.1 mm
Weight: 3764 oz

## 2016-11-13 LAB — BASIC METABOLIC PANEL
Anion gap: 8 (ref 5–15)
BUN: 12 mg/dL (ref 6–20)
CALCIUM: 9.4 mg/dL (ref 8.9–10.3)
CO2: 29 mmol/L (ref 22–32)
CREATININE: 0.9 mg/dL (ref 0.61–1.24)
Chloride: 103 mmol/L (ref 101–111)
Glucose, Bld: 116 mg/dL — ABNORMAL HIGH (ref 65–99)
Potassium: 3.4 mmol/L — ABNORMAL LOW (ref 3.5–5.1)
SODIUM: 140 mmol/L (ref 135–145)

## 2016-11-13 LAB — LIPID PANEL
CHOL/HDL RATIO: 2.3 ratio
CHOLESTEROL: 154 mg/dL (ref 0–200)
HDL: 68 mg/dL (ref >40)
LDL Cholesterol: 75 mg/dL (ref 0–99)
Triglycerides: 56 mg/dL (ref <150)
VLDL: 11 mg/dL (ref 0–40)

## 2016-11-13 LAB — CBC
HCT: 39.9 % (ref 39.0–52.0)
Hemoglobin: 13.1 g/dL (ref 13.0–17.0)
MCH: 27.1 pg (ref 26.0–34.0)
MCHC: 32.8 g/dL (ref 30.0–36.0)
MCV: 82.4 fL (ref 78.0–100.0)
PLATELETS: 189 10*3/uL (ref 150–400)
RBC: 4.84 MIL/uL (ref 4.22–5.81)
RDW: 14.9 % (ref 11.5–15.5)
WBC: 5.1 10*3/uL (ref 4.0–10.5)

## 2016-11-13 MED ORDER — CLOPIDOGREL BISULFATE 75 MG PO TABS
75.0000 mg | ORAL_TABLET | Freq: Every day | ORAL | Status: DC
  Administered 2016-11-14: 75 mg via ORAL
  Filled 2016-11-13: qty 1

## 2016-11-13 NOTE — Progress Notes (Signed)
  Echocardiogram 2D Echocardiogram has been performed.  Marcus Johnston 11/13/2016, 5:53 PM

## 2016-11-13 NOTE — Care Management Obs Status (Signed)
[place] NOTIFICATION   Patient Details  Name: Marcus Johnston MRN: [id number] Date of Birth: February 03, 1931   Medicare Observation Status Notification Given:  Yes    Pollie Friar, RN 11/13/2016, 12:22 PM

## 2016-11-13 NOTE — Evaluation (Signed)
Physical Therapy Evaluation Patient Details Name: Marcus Johnston MRN: [id number] DOB: [birth year] Today's Date: 11/13/2016   History of Present Illness  Marcus Johnston is a 83 y.o. male with a Past Medical History of sig for HTN, DM who presents with slurred speech and facial droop.  Clinical Impression  Patient presents with mobility close to baseline.  No focal weakness, coordination or sensory deficits noted.  He reports tends to drag feet at times, but independent to catch his balance.  No further skilled PT needs at this time.     Follow Up Recommendations No PT follow up    Equipment Recommendations  None recommended by PT    Recommendations for Other Services       Precautions / Restrictions Precautions Precautions: None      Mobility  Bed Mobility               General bed mobility comments: up in chair  Transfers Overall transfer level: Independent                  Ambulation/Gait Ambulation/Gait assistance: Independent Ambulation Distance (Feet): 200 Feet Assistive device: None Gait Pattern/deviations: Shuffle;WFL(Within Functional Limits)     General Gait Details: occasionally catching shoe on floor  Stairs Stairs: Yes Stairs assistance: Supervision Stair Management: One rail Right;Alternating pattern;Forwards Number of Stairs: 10 General stair comments: caught foot on edge of step but caught balance unaided  Wheelchair Mobility    Modified Rankin (Stroke Patients Only) Modified Rankin (Stroke Patients Only) Pre-Morbid Rankin Score: No symptoms Modified Rankin: No symptoms     Balance                                 Standardized Balance Assessment Standardized Balance Assessment : Dynamic Gait Index   Dynamic Gait Index Level Surface: Normal Change in Gait Speed: Normal Gait with Horizontal Head Turns: Mild Impairment Gait with Vertical Head Turns: Normal Gait and Pivot Turn: Normal Step Over Obstacle:  Normal Step Around Obstacles: Normal Steps: Mild Impairment Total Score: 22       Pertinent Vitals/Pain Pain Assessment: No/denies pain    Home Living Family/patient expects to be discharged to:: Private residence Living Arrangements: Spouse/significant other Available Help at Discharge: Family Type of Home: House Home Access: Stairs to enter Entrance Stairs-Rails: None Technical brewer of Steps: 1 Home Layout: Two level;Bed/bath upstairs Home Equipment: None      Prior Function Level of Independence: Independent               Hand Dominance        Extremity/Trunk Assessment   Upper Extremity Assessment Upper Extremity Assessment: Overall WFL for tasks assessed    Lower Extremity Assessment Lower Extremity Assessment: Overall WFL for tasks assessed       Communication   Communication: No difficulties  Cognition Arousal/Alertness: Awake/alert Behavior During Therapy: WFL for tasks assessed/performed Overall Cognitive Status: Within Functional Limits for tasks assessed                                        General Comments      Exercises     Assessment/Plan    PT Assessment Patent does not need any further PT services  PT Problem List         PT Treatment Interventions  PT Goals (Current goals can be found in the Care Plan section)  Acute Rehab PT Goals PT Goal Formulation: All assessment and education complete, DC therapy    Frequency     Barriers to discharge        Co-evaluation               End of Session   Activity Tolerance: Patient tolerated treatment well Patient left: in chair;with call bell/phone within reach        Time: 1132-1149 PT Time Calculation (min) (ACUTE ONLY): 17 min   Charges:   PT Evaluation $PT Eval Low Complexity: 1 Procedure     PT G Codes:   PT G-Codes **NOT FOR INPATIENT CLASS** Functional Assessment Tool Used: AM-PAC 6 Clicks Basic Mobility Functional  Limitation: Mobility: Walking and moving around Mobility: Walking and Moving Around Current Status (Y1950): At least 1 percent but less than 20 percent impaired, limited or restricted Mobility: Walking and Moving Around Goal Status [phone]): At least 1 percent but less than 20 percent impaired, limited or restricted Mobility: Walking and Moving Around Discharge Status [phone]): At least 1 percent but less than 20 percent impaired, limited or restricted    [place], [place] 11/13/2016   Reginia Naas 11/13/2016, 1:00 PM

## 2016-11-13 NOTE — Progress Notes (Signed)
STROKE TEAM PROGRESS NOTE   HISTORY OF PRESENT ILLNESS (per record) Marcus Johnston is an 83 y.o. male with known diabetes, cholesterol, hypertension who has never had a TIA or stroke in the past. Patient states that he had a sudden onset of slurred speech last night that occurred around 7 PM 11/11/2016 ([facility]). This lasted for approximately 5 minutes. Patient noted that his blood pressure which usually runs approximately 102/58 was up systolically in the 527P. Patient also had a headache at that time. They did not seek any medical attention at that time. Patient awoke with a headache at 1:00 this morning and again today his blood pressure noting it was elevated. He also noted that his right hand has some tingling chest fully resolved at this time. For this reason he was brought to the emergency department by his wife. Patient slurred speech and his right hand tingling is fully resolved however he still has a mild headache. Blood pressure has improved in his systolically in the 824M. Patient states he does take a baby aspirin every day and has not missed a dose. States his last A1c was approximately 8.0. And states he has good cholesterol but does not know the number. Patient was not administered IV t-PA secondary to out of window and symptoms resolved. He was admitted for further evaluation and treatment.   SUBJECTIVE (INTERVAL HISTORY) No family at bedside. Pt stated that yesterday he was eating and had right facial droop and slurry speech lasting 5 min. Later in the afternoon, he had right hand clumsy and tingling for 5-10 min when he tried to button his shirt. At the same time had right frontal HA, dull. The HA continues until today but improving over time. His glucose not in good control, recent A1C was 8.2 with PCP. HTN controlled well.   OBJECTIVE Temp:  [98.1 F (36.7 C)-98.6 F (37 C)] 98.2 F (36.8 C) (03/30 0626) Pulse Rate:  [54-86] 56 (03/30 0626) Cardiac Rhythm: Supraventricular  tachycardia;Other (Comment) (03/30 0308) Resp:  [14-23] 18 (03/30 0626) BP: (113-203)/(69-102) 150/82 (03/30 0626) SpO2:  [93 %-100 %] 95 % (03/30 0626) Weight:  [106.7 kg (235 lb 4 oz)] 106.7 kg (235 lb 4 oz) (03/30 0500)  CBC:  Recent Labs Lab 11/12/16 0307 11/13/16 0321  WBC  --  5.1  HGB 14.3 13.1  HCT 42.0 39.9  MCV  --  82.4  PLT  --  353    Basic Metabolic Panel:  Recent Labs Lab 11/12/16 0307 11/12/16 1435 11/13/16 0321  NA 143  --  140  K 4.0  --  3.4*  CL 103  --  103  CO2  --   --  29  GLUCOSE 179*  --  116*  BUN 21*  --  12  CREATININE 1.40* 1.13 0.90  CALCIUM  --   --  9.4    Lipid Panel:    Component Value Date/Time   CHOL 154 11/13/2016 0321   TRIG 56 11/13/2016 0321   HDL 68 11/13/2016 0321   CHOLHDL 2.3 11/13/2016 0321   VLDL 11 11/13/2016 0321   LDLCALC 75 11/13/2016 0321   HgbA1c: No results found for: HGBA1C Urine Drug Screen: No results found for: LABOPIA, COCAINSCRNUR, LABBENZ, AMPHETMU, THCU, LABBARB    IMAGING I have personally reviewed the radiological images below and agree with the radiology interpretations.  Ct Head Wo Contrast 11/12/2016 1.  No acute intracranial abnormality identified. 2. Patchy bilateral cerebral white matter hypodensity, most commonly due to  chronic small vessel disease. 3. Posterior vertex increased dural thickening and calcification without definite dural based mass.   Mr Brain Wo Contrast 11/12/2016 1. No acute intracranial abnormality identified. 2. Moderate chronic microvascular ischemic changes and moderate parenchymal volume loss of the brain. 3. Scattered punctate foci of susceptibility hypointensity compatible with hemosiderin deposition, probably related to chronic hypertension, less likely cerebral amyloid angiopathy.   Mr Lovenia Kim 11/12/2016 4. Normal MRA of the head.   CUS pending  TTE pending   PHYSICAL EXAM  Temp:  [98.1 F (36.7 C)-98.4 F (36.9 C)] 98.2 F (36.8 C) (03/30  1300) Pulse Rate:  [54-86] 64 (03/30 1300) Resp:  [15-18] 17 (03/30 1300) BP: (113-196)/(69-90) 155/80 (03/30 1300) SpO2:  [95 %-98 %] 97 % (03/30 1300) Weight:  [235 lb 4 oz (106.7 kg)] 235 lb 4 oz (106.7 kg) (03/30 0500)  General - Well nourished, well developed, in no apparent distress.  Ophthalmologic - Sharp disc margins OU.   Cardiovascular - Regular rate and rhythm.  Mental Status -  Level of arousal and orientation to time, place, and person were intact. Language including expression, naming, repetition, comprehension was assessed and found intact. Fund of Knowledge was assessed and was intact.  Cranial Nerves II - XII - II - Visual field intact OU. III, IV, VI - Extraocular movements intact. V - Facial sensation intact bilaterally. VII - Facial movement intact bilaterally. VIII - Hearing & vestibular intact bilaterally. X - Palate elevates symmetrically. XI - Chin turning & shoulder shrug intact bilaterally. XII - Tongue protrusion intact.  Motor Strength - The patient's strength was normal in all extremities and pronator drift was absent.  Bulk was normal and fasciculations were absent.   Motor Tone - Muscle tone was assessed at the neck and appendages and was normal.  Reflexes - The patient's reflexes were 1+ in all extremities and he had no pathological reflexes.  Sensory - Light touch, temperature/pinprick were assessed and were symmetrical.    Coordination - The patient had normal movements in the hands with no ataxia or dysmetria.  Tremor was absent.  Gait and Station - deferred   ASSESSMENT/PLAN Mr. Marcus Johnston is a 83 y.o. male with history of HTN, Hyperlipidemia, DM type II  presenting with facial droop and slurred speech. He did not receive IV t-PA due to out of window and symptoms resolved.   TIA most likely, risk factor including uncontrolled HTN, DM and HTN, but complicated migraine is in the DDx  CT head no acute stroke. small vessel  disease.  MRI  No acute stroke.   MRA  normal  Carotid Doppler  pending  2D Echo  pending  LDL 75  HgbA1c pending  UDS pending  Heparin 5000 units sq tid for VTE prophylaxis  Diet heart healthy/carb modified Room service appropriate? Yes; Fluid consistency: Thin  aspirin 81 mg daily prior to admission, now on aspirin 81 mg daily. Recommend to change to plavix for further stroke prevention  Patient counseled to be compliant with his antithrombotic medications  Ongoing aggressive stroke risk factor management  Therapy recommendations:  pending   Disposition:  pending   Hypertension  Elevated 150-200s  Permissive hypertension (OK if <220/120) for 24-48 hours post stroke and then gradually normalized within 5-7 days.  Long term BP goal normotensive  Hyperlipidemia  Home meds:  No statin  LDL 75  Pt has allergy to statin  Hold off statins for now  Diabetes type II  HgbA1c pending, goal <  7.0  Recent A1C with PCP 8.2  Hyperglycemia  SSI  CBG monitoring  Close PCP follow up  Other Stroke Risk Factors  Advanced age  Other Active Problems  Renal insufficiency  Hospital day # 0  Rosalin Hawking, MD PhD Stroke Neurology 11/13/2016 4:12 PM    To contact Stroke Continuity provider, please refer to [email or web address] After hours, contact General Neurology

## 2016-11-13 NOTE — Progress Notes (Signed)
OT Cancellation and Discharge Note  Patient Details Name: Marcus Johnston MRN: [id number] DOB: 08-Jun-1931   Cancelled Treatment:    Reason Eval/Treat Not Completed: OT screened, no needs identified, will sign off.   Lea 11/13/2016, 2:37 PM  Hulda Humphrey OTR/L [phone]

## 2016-11-13 NOTE — Progress Notes (Signed)
[facility] PROGRESS NOTE  Marcus Johnston CNO:[id number] DOB: 02/13/1931 DOA: 11/12/2016   PCP: [name], MD     Assessment/Plan: Principal Problem:   TIA (transient ischemic attack) Active Problems:   HTN (hypertension)   DM (diabetes mellitus) ([place])   Renal insufficiency    Marcus Johnston is a 83 y.o. male with medical history significant of HTN, Hyperlipidemia, DM type II who presented to the ED with c/o aphasia and slurred speech, left facial droop and some fine movements of the right hand.  The episode of slurred speech occurred around 7 PM yesterday and lasted approximately 5 minutes. Soon after patient developed severe headache. He checked his blood pressure and reported that systolic was running in 294T.   Assessment/Plan TIA She was seen by neurology, recommended proceed with MRI/MRA of the brain (No acute intracranial abnormality identified), check hemoglobin A1c, lipid profile (LDL 75) The symptoms resolved, continue to keep on telemetry and monitor symptoms PT consult, OT consult,-no follow needed , Speech consult Aspirin - dose 325 mg daily  HTN - poor control presentation Continue Norvasc and Hyzaar and adjust doses if needed  DM type II Hold metformin, continue Humalog and sliding scale insulin, monitor CBGs before every meal C and daily at bedtime, continue carb modified diet  Renal insufficiency - it is unclear acute kidney injury versus exacerbation of underlying chronic kidney disease as we do not have any other creatinine for comparison   DVT prophylaxsis heparin  Code Status:  Full code     Family Communication: Discussed in detail with the patient, all imaging results, lab results explained to the patient   Disposition Plan:  Continue stroke work up      Consultants: neurology  Procedures:  None   Antibiotics: Anti-infectives    None         HPI/Subjective:    Objective: Vitals:   11/13/16 0430  11/13/16 0500 11/13/16 0626 11/13/16 0925  BP: (!) 158/77  (!) 150/82 (!) 167/85  Pulse: (!) 54  (!) 56 86  Resp: 18  18 18   Temp: 98.2 F (36.8 C)  98.2 F (36.8 C) 98.4 F (36.9 C)  TempSrc: Oral  Oral Oral  SpO2: 95%  95% 98%  Weight:  106.7 kg (235 lb 4 oz)    Height:  6\' 3"  (1.905 m)     No intake or output data in the 24 hours ending 11/13/16 1420  Exam:  Examination:  General exam: Appears calm and comfortable  Respiratory system: Clear to auscultation. Respiratory effort normal. Cardiovascular system: S1 & S2 heard, RRR. No JVD, murmurs, rubs, gallops or clicks. No pedal edema. Gastrointestinal system: Abdomen is nondistended, soft and nontender. No organomegaly or masses felt. Normal bowel sounds heard. Central nervous system: Alert and oriented. No focal neurological deficits. Extremities: Symmetric 5 x 5 power. Skin: No rashes, lesions or ulcers Psychiatry: Judgement and insight appear normal. Mood & affect appropriate.     Data Reviewed: I have personally reviewed following labs and imaging studies  Micro Results No results found for this or any previous visit (from the past 240 hour(s)).  Radiology Reports Ct Head Wo Contrast  Result Date: 11/12/2016 CLINICAL DATA:  83 year old male with sudden onset slurred speech this morning. Right side headache. Initial encounter. EXAM: CT HEAD WITHOUT CONTRAST TECHNIQUE: Contiguous axial images were obtained from the base of the skull through the vertex without intravenous contrast. COMPARISON:  None. FINDINGS: Brain: Cerebral volume is  within normal limits for age. No midline shift, ventriculomegaly, mass effect, intracranial hemorrhage or evidence of cortically based acute infarction. Patchy bilateral cerebral white matter hypodensity. No cortical encephalomalacia. At the posterior vertex there is an area of increased dural thickening and calcification (series 5, image 53), but no definite dural mass. Vascular: No suspicious  intracranial vascular hyperdensity. Intracranial atherosclerosis suspected. Skull: Negative.  No acute osseous abnormality identified. Sinuses/Orbits: Clear. Other: Postoperative changes to the globes, otherwise negative orbits soft tissues. No acute scalp soft tissue finding. IMPRESSION: 1.  No acute intracranial abnormality identified. 2. Patchy bilateral cerebral white matter hypodensity, most commonly due to chronic small vessel disease. 3. Posterior vertex increased dural thickening and calcification without definite dural based mass. Electronically Signed   By: Genevie Ann M.D.   On: 11/12/2016 08:49   Mr Brain Wo Contrast  Result Date: 11/12/2016 CLINICAL DATA:  83 y/o M; acute onset slurred speech and left-sided facial droop. EXAM: MRI HEAD WITHOUT CONTRAST MRA HEAD WITHOUT CONTRAST TECHNIQUE: Multiplanar, multiecho pulse sequences of the brain and surrounding structures were obtained without intravenous contrast. Angiographic images of the head were obtained using MRA technique without contrast. COMPARISON:  11/12/2016 CT of the head. FINDINGS: MRI HEAD FINDINGS Brain: No acute infarction, hemorrhage, hydrocephalus, extra-axial collection or mass lesion. Numerous punctate foci of susceptibility hypointensity are present throughout the supratentorial and infratentorial brain in a random pattern. There are nonspecific foci of T2 FLAIR hyperintensity in subcortical and periventricular white matter compatible with moderate chronic microvascular ischemic changes and moderate parenchymal volume loss of the brain. Vascular: As below. Skull and upper cervical spine: Normal marrow signal. Sinuses/Orbits: Negative. Other: None. MRA HEAD FINDINGS Internal carotid arteries:  Patent. Anterior cerebral arteries:  Patent. Middle cerebral arteries: Patent. Anterior communicating artery: Not identified, likely hypoplastic or absent. Posterior communicating arteries:  Patent. Posterior cerebral arteries:  Patent. Basilar  artery:  Patent. Vertebral arteries:  Patent. No evidence of high-grade stenosis, large vessel occlusion, or aneurysm. IMPRESSION: 1. No acute intracranial abnormality identified. 2. Moderate chronic microvascular ischemic changes and moderate parenchymal volume loss of the brain. 3. Scattered punctate foci of susceptibility hypointensity compatible with hemosiderin deposition, probably related to chronic hypertension, less likely cerebral amyloid angiopathy. 4. Normal MRA of the head. Electronically Signed   By: Kristine Garbe M.D.   On: 11/12/2016 23:14   Mr Lovenia Kim  Result Date: 11/12/2016 CLINICAL DATA:  83 y/o M; acute onset slurred speech and left-sided facial droop. EXAM: MRI HEAD WITHOUT CONTRAST MRA HEAD WITHOUT CONTRAST TECHNIQUE: Multiplanar, multiecho pulse sequences of the brain and surrounding structures were obtained without intravenous contrast. Angiographic images of the head were obtained using MRA technique without contrast. COMPARISON:  11/12/2016 CT of the head. FINDINGS: MRI HEAD FINDINGS Brain: No acute infarction, hemorrhage, hydrocephalus, extra-axial collection or mass lesion. Numerous punctate foci of susceptibility hypointensity are present throughout the supratentorial and infratentorial brain in a random pattern. There are nonspecific foci of T2 FLAIR hyperintensity in subcortical and periventricular white matter compatible with moderate chronic microvascular ischemic changes and moderate parenchymal volume loss of the brain. Vascular: As below. Skull and upper cervical spine: Normal marrow signal. Sinuses/Orbits: Negative. Other: None. MRA HEAD FINDINGS Internal carotid arteries:  Patent. Anterior cerebral arteries:  Patent. Middle cerebral arteries: Patent. Anterior communicating artery: Not identified, likely hypoplastic or absent. Posterior communicating arteries:  Patent. Posterior cerebral arteries:  Patent. Basilar artery:  Patent. Vertebral arteries:  Patent. No  evidence of high-grade stenosis, large vessel occlusion, or aneurysm.  IMPRESSION: 1. No acute intracranial abnormality identified. 2. Moderate chronic microvascular ischemic changes and moderate parenchymal volume loss of the brain. 3. Scattered punctate foci of susceptibility hypointensity compatible with hemosiderin deposition, probably related to chronic hypertension, less likely cerebral amyloid angiopathy. 4. Normal MRA of the head. Electronically Signed   By: Kristine Garbe M.D.   On: 11/12/2016 23:14     CBC  Recent Labs Lab 11/12/16 0307 11/13/16 0321  WBC  --  5.1  HGB 14.3 13.1  HCT 42.0 39.9  PLT  --  189  MCV  --  82.4  MCH  --  27.1  MCHC  --  32.8  RDW  --  14.9    Chemistries   Recent Labs Lab 11/12/16 0307 11/12/16 1435 11/13/16 0321  NA 143  --  140  K 4.0  --  3.4*  CL 103  --  103  CO2  --   --  29  GLUCOSE 179*  --  116*  BUN 21*  --  12  CREATININE 1.40* 1.13 0.90  CALCIUM  --   --  9.4   ------------------------------------------------------------------------------------------------------------------ estimated creatinine clearance is 82.2 mL/min (by C-G formula based on SCr of 0.9 mg/dL). ------------------------------------------------------------------------------------------------------------------ No results for input(s): HGBA1C in the last 72 hours. ------------------------------------------------------------------------------------------------------------------  Recent Labs  11/13/16 0321  CHOL 154  HDL 68  LDLCALC 75  TRIG 56  CHOLHDL 2.3   ------------------------------------------------------------------------------------------------------------------ No results for input(s): TSH, T4TOTAL, T3FREE, THYROIDAB in the last 72 hours.  Invalid input(s): FREET3 ------------------------------------------------------------------------------------------------------------------ No results for input(s): VITAMINB12, FOLATE, FERRITIN,  TIBC, IRON, RETICCTPCT in the last 72 hours.  Coagulation profile  Recent Labs Lab 11/12/16 0229  INR 0.96    No results for input(s): DDIMER in the last 72 hours.  Cardiac Enzymes No results for input(s): CKMB, TROPONINI, MYOGLOBIN in the last 168 hours.  Invalid input(s): CK ------------------------------------------------------------------------------------------------------------------ Invalid input(s): POCBNP   CBG:  Recent Labs Lab 11/12/16 1609 11/13/16 0641 11/13/16 1208  GLUCAP 203* 126* 246*       Studies: Ct Head Wo Contrast  Result Date: 11/12/2016 CLINICAL DATA:  83 year old male with sudden onset slurred speech this morning. Right side headache. Initial encounter. EXAM: CT HEAD WITHOUT CONTRAST TECHNIQUE: Contiguous axial images were obtained from the base of the skull through the vertex without intravenous contrast. COMPARISON:  None. FINDINGS: Brain: Cerebral volume is within normal limits for age. No midline shift, ventriculomegaly, mass effect, intracranial hemorrhage or evidence of cortically based acute infarction. Patchy bilateral cerebral white matter hypodensity. No cortical encephalomalacia. At the posterior vertex there is an area of increased dural thickening and calcification (series 5, image 53), but no definite dural mass. Vascular: No suspicious intracranial vascular hyperdensity. Intracranial atherosclerosis suspected. Skull: Negative.  No acute osseous abnormality identified. Sinuses/Orbits: Clear. Other: Postoperative changes to the globes, otherwise negative orbits soft tissues. No acute scalp soft tissue finding. IMPRESSION: 1.  No acute intracranial abnormality identified. 2. Patchy bilateral cerebral white matter hypodensity, most commonly due to chronic small vessel disease. 3. Posterior vertex increased dural thickening and calcification without definite dural based mass. Electronically Signed   By: Genevie Ann M.D.   On: 11/12/2016 08:49   Mr  Brain Wo Contrast  Result Date: 11/12/2016 CLINICAL DATA:  83 y/o M; acute onset slurred speech and left-sided facial droop. EXAM: MRI HEAD WITHOUT CONTRAST MRA HEAD WITHOUT CONTRAST TECHNIQUE: Multiplanar, multiecho pulse sequences of the brain and surrounding structures were obtained without intravenous contrast. Angiographic images of  the head were obtained using MRA technique without contrast. COMPARISON:  11/12/2016 CT of the head. FINDINGS: MRI HEAD FINDINGS Brain: No acute infarction, hemorrhage, hydrocephalus, extra-axial collection or mass lesion. Numerous punctate foci of susceptibility hypointensity are present throughout the supratentorial and infratentorial brain in a random pattern. There are nonspecific foci of T2 FLAIR hyperintensity in subcortical and periventricular white matter compatible with moderate chronic microvascular ischemic changes and moderate parenchymal volume loss of the brain. Vascular: As below. Skull and upper cervical spine: Normal marrow signal. Sinuses/Orbits: Negative. Other: None. MRA HEAD FINDINGS Internal carotid arteries:  Patent. Anterior cerebral arteries:  Patent. Middle cerebral arteries: Patent. Anterior communicating artery: Not identified, likely hypoplastic or absent. Posterior communicating arteries:  Patent. Posterior cerebral arteries:  Patent. Basilar artery:  Patent. Vertebral arteries:  Patent. No evidence of high-grade stenosis, large vessel occlusion, or aneurysm. IMPRESSION: 1. No acute intracranial abnormality identified. 2. Moderate chronic microvascular ischemic changes and moderate parenchymal volume loss of the brain. 3. Scattered punctate foci of susceptibility hypointensity compatible with hemosiderin deposition, probably related to chronic hypertension, less likely cerebral amyloid angiopathy. 4. Normal MRA of the head. Electronically Signed   By: Kristine Garbe M.D.   On: 11/12/2016 23:14   Mr Lovenia Kim  Result Date:  11/12/2016 CLINICAL DATA:  83 y/o M; acute onset slurred speech and left-sided facial droop. EXAM: MRI HEAD WITHOUT CONTRAST MRA HEAD WITHOUT CONTRAST TECHNIQUE: Multiplanar, multiecho pulse sequences of the brain and surrounding structures were obtained without intravenous contrast. Angiographic images of the head were obtained using MRA technique without contrast. COMPARISON:  11/12/2016 CT of the head. FINDINGS: MRI HEAD FINDINGS Brain: No acute infarction, hemorrhage, hydrocephalus, extra-axial collection or mass lesion. Numerous punctate foci of susceptibility hypointensity are present throughout the supratentorial and infratentorial brain in a random pattern. There are nonspecific foci of T2 FLAIR hyperintensity in subcortical and periventricular white matter compatible with moderate chronic microvascular ischemic changes and moderate parenchymal volume loss of the brain. Vascular: As below. Skull and upper cervical spine: Normal marrow signal. Sinuses/Orbits: Negative. Other: None. MRA HEAD FINDINGS Internal carotid arteries:  Patent. Anterior cerebral arteries:  Patent. Middle cerebral arteries: Patent. Anterior communicating artery: Not identified, likely hypoplastic or absent. Posterior communicating arteries:  Patent. Posterior cerebral arteries:  Patent. Basilar artery:  Patent. Vertebral arteries:  Patent. No evidence of high-grade stenosis, large vessel occlusion, or aneurysm. IMPRESSION: 1. No acute intracranial abnormality identified. 2. Moderate chronic microvascular ischemic changes and moderate parenchymal volume loss of the brain. 3. Scattered punctate foci of susceptibility hypointensity compatible with hemosiderin deposition, probably related to chronic hypertension, less likely cerebral amyloid angiopathy. 4. Normal MRA of the head. Electronically Signed   By: Kristine Garbe M.D.   On: 11/12/2016 23:14      No results found for: HGBA1C Lab Results  Component Value Date    LDLCALC 75 11/13/2016   CREATININE 0.90 11/13/2016       Scheduled Meds: . amLODipine  5 mg Oral Daily  . aspirin EC  81 mg Oral Daily  . cholecalciferol  1,000 Units Oral Daily  . doxazosin  4 mg Oral Daily  . heparin  5,000 Units Subcutaneous Q8H  . losartan  100 mg Oral Daily   And  . hydrochlorothiazide  25 mg Oral Daily  . insulin aspart  0-9 Units Subcutaneous TID WC  . insulin aspart protamine- aspart  24 Units Subcutaneous BID WC   Continuous Infusions:   LOS: 0 days    Time spent: >  [facility] Pager [phone]. If 7PM-7AM, please contact night-coverage at [email or web address], password [facility] 11/13/2016, 2:20 PM  LOS: 0 days

## 2016-11-14 ENCOUNTER — Observation Stay (HOSPITAL_BASED_OUTPATIENT_CLINIC_OR_DEPARTMENT_OTHER): Payer: Medicare Other

## 2016-11-14 DIAGNOSIS — E0800 Diabetes mellitus due to underlying condition with hyperosmolarity without nonketotic hyperglycemic-hyperosmolar coma (NKHHC): Secondary | ICD-10-CM

## 2016-11-14 DIAGNOSIS — G459 Transient cerebral ischemic attack, unspecified: Secondary | ICD-10-CM

## 2016-11-14 DIAGNOSIS — N289 Disorder of kidney and ureter, unspecified: Secondary | ICD-10-CM

## 2016-11-14 DIAGNOSIS — G454 Transient global amnesia: Secondary | ICD-10-CM | POA: Diagnosis not present

## 2016-11-14 DIAGNOSIS — G458 Other transient cerebral ischemic attacks and related syndromes: Secondary | ICD-10-CM

## 2016-11-14 LAB — VAS US CAROTID
LEFT ECA DIAS: -9 cm/s
LEFT VERTEBRAL DIAS: -12 cm/s
Left CCA dist dias: -14 cm/s
Left CCA dist sys: -76 cm/s
Left CCA prox dias: 11 cm/s
Left CCA prox sys: 98 cm/s
Left ICA dist dias: -14 cm/s
Left ICA dist sys: -52 cm/s
Left ICA prox dias: -13 cm/s
Left ICA prox sys: -51 cm/s
RIGHT ECA DIAS: -10 cm/s
RIGHT VERTEBRAL DIAS: -7 cm/s
Right CCA prox dias: 3 cm/s
Right CCA prox sys: 133 cm/s
Right cca dist sys: -55 cm/s

## 2016-11-14 LAB — HEMOGLOBIN A1C
Hgb A1c MFr Bld: 7.9 % — ABNORMAL HIGH (ref 4.8–5.6)
Mean Plasma Glucose: 180 mg/dL

## 2016-11-14 LAB — GLUCOSE, CAPILLARY
Glucose-Capillary: 181 mg/dL — ABNORMAL HIGH (ref 65–99)
Glucose-Capillary: 146 mg/dL — ABNORMAL HIGH (ref 65–99)

## 2016-11-14 MED ORDER — CLOPIDOGREL BISULFATE 75 MG PO TABS: 75.0000 mg | ORAL_TABLET | Freq: Every day | ORAL | 1 refills | Status: DC

## 2016-11-14 MED ORDER — CHOLECALCIFEROL 25 MCG (1000 UT) PO TABS: 1000.0000 [IU] | ORAL_TABLET | Freq: Every day | ORAL | 1 refills | Status: DC

## 2016-11-14 NOTE — Progress Notes (Signed)
VASCULAR LAB PRELIMINARY  PRELIMINARY  PRELIMINARY  PRELIMINARY  Carotid duplex completed.    Preliminary report:  1-39% ICA plaquing. Vertebral artery flow is antegrade.   Sheika Coutts, RVT 11/14/2016, 8:27 AM

## 2016-11-14 NOTE — Discharge Summary (Signed)
Physician Discharge Summary  Marcus Johnston MRN: [id number] DOB/AGE: September 20, 1930 83 y.o.  PCP: [name], MD   Admit date: 11/12/2016 Discharge date: 11/14/2016  Discharge Diagnoses:    Principal Problem:   TIA (transient ischemic attack) Active Problems:   Uncontrolled hypertension   DM (diabetes mellitus) ([place])   Renal insufficiency   Hyperglycemia    Follow-up recommendations Follow-up with PCP in 3-5 days , including all  additional recommended appointments as below Follow-up CBC, CMP in 3-5 days       Current Discharge Medication List    START taking these medications   Details  clopidogrel (PLAVIX) 75 MG tablet Take 1 tablet (75 mg total) by mouth daily. Qty: 30 tablet, Refills: 1      CONTINUE these medications which have CHANGED   Details  !! cholecalciferol 1000 units tablet Take 1 tablet (1,000 Units total) by mouth daily. Qty: 30 tablet, Refills: 1     !! - Potential duplicate medications found. Please discuss with provider.    CONTINUE these medications which have NOT CHANGED   Details  amLODipine (NORVASC) 5 MG tablet Take 5 mg by mouth daily.    !! cholecalciferol (VITAMIN D) 1000 units tablet Take 1,000 Units by mouth daily.    doxazosin (CARDURA) 4 MG tablet Take 4 mg by mouth daily.     fexofenadine (ALLEGRA) 180 MG tablet Take 180 mg by mouth daily.    HUMALOG MIX 50/50 KWIKPEN (50-50) 100 UNIT/ML Kwikpen Inject 24 Units into the skin 2 (two) times daily.     losartan-hydrochlorothiazide (HYZAAR) 100-25 MG per tablet Take 1 tablet by mouth daily.     metFORMIN (GLUCOPHAGE-XR) 500 MG 24 hr tablet Take 2,000 mg by mouth every evening.     !! - Potential duplicate medications found. Please discuss with provider.    STOP taking these medications     aspirin EC 81 MG tablet          Discharge Condition:    Discharge Instructions Get Medicines reviewed and adjusted: Please take all your medications with you for your next  visit with your Primary MD  Please request your Primary MD to go over all hospital tests and procedure/radiological results at the follow up, please ask your Primary MD to get all Hospital records sent to his/her office.  If you experience worsening of your admission symptoms, develop shortness of breath, life threatening emergency, suicidal or homicidal thoughts you must seek medical attention immediately by calling 911 or calling your MD immediately if symptoms less severe.  You must read complete instructions/literature along with all the possible adverse reactions/side effects for all the Medicines you take and that have been prescribed to you. Take any new Medicines after you have completely understood and accpet all the possible adverse reactions/side effects.   Do not drive when taking Pain medications.   Do not take more than prescribed Pain, Sleep and Anxiety Medications  Special Instructions: If you have smoked or chewed Tobacco in the last 2 yrs please stop smoking, stop any regular Alcohol and or any Recreational drug use.  Wear Seat belts while driving.  Please note  You were cared for by a hospitalist during your hospital stay. Once you are discharged, your primary care physician will handle any further medical issues. Please note that NO REFILLS for any discharge medications will be authorized once you are discharged, as it is imperative that you return to your primary care physician (or establish a relationship with a  primary care physician if you do not have one) for your aftercare needs so that they can reassess your need for medications and monitor your lab values.  Discharge Instructions    Diet - low sodium heart healthy    Complete by:  As directed    Increase activity slowly    Complete by:  As directed        Allergies  Allergen Reactions  . Statins Other (See Comments)    pain      Disposition: Final discharge disposition not confirmed   Consults:   neurology    Significant Diagnostic Studies:  Ct Head Wo Contrast  Result Date: 11/12/2016 CLINICAL DATA:  83 year old male with sudden onset slurred speech this morning. Right side headache. Initial encounter. EXAM: CT HEAD WITHOUT CONTRAST TECHNIQUE: Contiguous axial images were obtained from the base of the skull through the vertex without intravenous contrast. COMPARISON:  None. FINDINGS: Brain: Cerebral volume is within normal limits for age. No midline shift, ventriculomegaly, mass effect, intracranial hemorrhage or evidence of cortically based acute infarction. Patchy bilateral cerebral white matter hypodensity. No cortical encephalomalacia. At the posterior vertex there is an area of increased dural thickening and calcification (series 5, image 53), but no definite dural mass. Vascular: No suspicious intracranial vascular hyperdensity. Intracranial atherosclerosis suspected. Skull: Negative.  No acute osseous abnormality identified. Sinuses/Orbits: Clear. Other: Postoperative changes to the globes, otherwise negative orbits soft tissues. No acute scalp soft tissue finding. IMPRESSION: 1.  No acute intracranial abnormality identified. 2. Patchy bilateral cerebral white matter hypodensity, most commonly due to chronic small vessel disease. 3. Posterior vertex increased dural thickening and calcification without definite dural based mass. Electronically Signed   By: Genevie Ann M.D.   On: 11/12/2016 08:49   Mr Brain Wo Contrast  Result Date: 11/12/2016 CLINICAL DATA:  83 y/o M; acute onset slurred speech and left-sided facial droop. EXAM: MRI HEAD WITHOUT CONTRAST MRA HEAD WITHOUT CONTRAST TECHNIQUE: Multiplanar, multiecho pulse sequences of the brain and surrounding structures were obtained without intravenous contrast. Angiographic images of the head were obtained using MRA technique without contrast. COMPARISON:  11/12/2016 CT of the head. FINDINGS: MRI HEAD FINDINGS Brain: No acute infarction,  hemorrhage, hydrocephalus, extra-axial collection or mass lesion. Numerous punctate foci of susceptibility hypointensity are present throughout the supratentorial and infratentorial brain in a random pattern. There are nonspecific foci of T2 FLAIR hyperintensity in subcortical and periventricular white matter compatible with moderate chronic microvascular ischemic changes and moderate parenchymal volume loss of the brain. Vascular: As below. Skull and upper cervical spine: Normal marrow signal. Sinuses/Orbits: Negative. Other: None. MRA HEAD FINDINGS Internal carotid arteries:  Patent. Anterior cerebral arteries:  Patent. Middle cerebral arteries: Patent. Anterior communicating artery: Not identified, likely hypoplastic or absent. Posterior communicating arteries:  Patent. Posterior cerebral arteries:  Patent. Basilar artery:  Patent. Vertebral arteries:  Patent. No evidence of high-grade stenosis, large vessel occlusion, or aneurysm. IMPRESSION: 1. No acute intracranial abnormality identified. 2. Moderate chronic microvascular ischemic changes and moderate parenchymal volume loss of the brain. 3. Scattered punctate foci of susceptibility hypointensity compatible with hemosiderin deposition, probably related to chronic hypertension, less likely cerebral amyloid angiopathy. 4. Normal MRA of the head. Electronically Signed   By: Kristine Garbe M.D.   On: 11/12/2016 23:14   Mr Lovenia Kim  Result Date: 11/12/2016 CLINICAL DATA:  83 y/o M; acute onset slurred speech and left-sided facial droop. EXAM: MRI HEAD WITHOUT CONTRAST MRA HEAD WITHOUT CONTRAST TECHNIQUE: Multiplanar, multiecho pulse sequences of  the brain and surrounding structures were obtained without intravenous contrast. Angiographic images of the head were obtained using MRA technique without contrast. COMPARISON:  11/12/2016 CT of the head. FINDINGS: MRI HEAD FINDINGS Brain: No acute infarction, hemorrhage, hydrocephalus, extra-axial collection  or mass lesion. Numerous punctate foci of susceptibility hypointensity are present throughout the supratentorial and infratentorial brain in a random pattern. There are nonspecific foci of T2 FLAIR hyperintensity in subcortical and periventricular white matter compatible with moderate chronic microvascular ischemic changes and moderate parenchymal volume loss of the brain. Vascular: As below. Skull and upper cervical spine: Normal marrow signal. Sinuses/Orbits: Negative. Other: None. MRA HEAD FINDINGS Internal carotid arteries:  Patent. Anterior cerebral arteries:  Patent. Middle cerebral arteries: Patent. Anterior communicating artery: Not identified, likely hypoplastic or absent. Posterior communicating arteries:  Patent. Posterior cerebral arteries:  Patent. Basilar artery:  Patent. Vertebral arteries:  Patent. No evidence of high-grade stenosis, large vessel occlusion, or aneurysm. IMPRESSION: 1. No acute intracranial abnormality identified. 2. Moderate chronic microvascular ischemic changes and moderate parenchymal volume loss of the brain. 3. Scattered punctate foci of susceptibility hypointensity compatible with hemosiderin deposition, probably related to chronic hypertension, less likely cerebral amyloid angiopathy. 4. Normal MRA of the head. Electronically Signed   By: Kristine Garbe M.D.   On: 11/12/2016 23:14    echocardiogram  ------------------------------------------------------------------- LV EF: 60% -   65%  ------------------------------------------------------------------- Indications:      TIA 435.9.  ------------------------------------------------------------------- History:   Risk factors:  Hypertension. Diabetes mellitus.  ------------------------------------------------------------------- Study Conclusions  - Left ventricle: The cavity size was normal. There was mild   concentric hypertrophy. Systolic function was normal. The   estimated ejection fraction was  in the range of 60% to 65%. Wall   motion was normal; there were no regional wall motion   abnormalities. Doppler parameters are consistent with abnormal   left ventricular relaxation (grade 1 diastolic dysfunction).   There was no evidence of elevated ventricular filling pressure by   Doppler parameters. - Aortic valve: There was no regurgitation. - Aortic root: The aortic root was normal in size. - Mitral valve: There was trivial regurgitation. - Left atrium: The atrium was normal in size. - Right ventricle: Systolic function was normal. - Right atrium: The atrium was normal in size. - Tricuspid valve: There was no regurgitation. - Pulmonary arteries: Systolic pressure could not be accurately   estimated. - Inferior vena cava: The vessel was normal in size. - Pericardium, extracardiac: There was no pericardial effusion.  Impressions:  - No cardiac source of emboli was indentified.     Filed Weights   11/13/16 0500  Weight: 106.7 kg (235 lb 4 oz)     Microbiology: No results found for this or any previous visit (from the past 240 hour(s)).     Blood Culture No results found for: SDES, [place], [place], REPTSTATUS    Labs: Results for orders placed or performed during the hospital encounter of 11/12/16 (from the past 48 hour(s))  Creatinine, serum     Status: Abnormal   Collection Time: 11/12/16  2:35 PM  Result Value Ref Range   Creatinine, Ser 1.13 0.61 - 1.24 mg/dL   GFR calc non Af Amer 58 (L) >60 mL/min   GFR calc Af Amer >60 >60 mL/min    Comment: (NOTE) The eGFR has been calculated using the CKD EPI equation. This calculation has not been validated in all clinical situations. eGFR's persistently <60 mL/min signify possible Chronic Kidney Disease.   Glucose, capillary  Status: Abnormal   Collection Time: 11/12/16  4:09 PM  Result Value Ref Range   Glucose-Capillary 203 (H) 65 - 99 mg/dL   Comment 1 Notify RN    Comment 2 Document in Chart    Hemoglobin A1c     Status: Abnormal   Collection Time: 11/13/16  3:21 AM  Result Value Ref Range   Hgb A1c MFr Bld 7.9 (H) 4.8 - 5.6 %    Comment: (NOTE)         Pre-diabetes: 5.7 - 6.4         Diabetes: >6.4         Glycemic control for adults with diabetes: <7.0    Mean Plasma Glucose 180 mg/dL    Comment: (NOTE) Performed At: [facility] [place], [place] [id number] Lindon Romp MD IO:[id number]   Basic metabolic panel     Status: Abnormal   Collection Time: 11/13/16  3:21 AM  Result Value Ref Range   Sodium 140 135 - 145 mmol/L   Potassium 3.4 (L) 3.5 - 5.1 mmol/L   Chloride 103 101 - 111 mmol/L   CO2 29 22 - 32 mmol/L   Glucose, Bld 116 (H) 65 - 99 mg/dL   BUN 12 6 - 20 mg/dL   Creatinine, Ser 0.90 0.61 - 1.24 mg/dL   Calcium 9.4 8.9 - 10.3 mg/dL   GFR calc non Af Amer >60 >60 mL/min   GFR calc Af Amer >60 >60 mL/min    Comment: (NOTE) The eGFR has been calculated using the CKD EPI equation. This calculation has not been validated in all clinical situations. eGFR's persistently <60 mL/min signify possible Chronic Kidney Disease.    Anion gap 8 5 - 15  CBC     Status: None   Collection Time: 11/13/16  3:21 AM  Result Value Ref Range   WBC 5.1 4.0 - 10.5 K/uL   RBC 4.84 4.22 - 5.81 MIL/uL   Hemoglobin 13.1 13.0 - 17.0 g/dL   HCT 39.9 39.0 - 52.0 %   MCV 82.4 78.0 - 100.0 fL   MCH 27.1 26.0 - 34.0 pg   MCHC 32.8 30.0 - 36.0 g/dL   RDW 14.9 11.5 - 15.5 %   Platelets 189 150 - 400 K/uL  Lipid panel     Status: None   Collection Time: 11/13/16  3:21 AM  Result Value Ref Range   Cholesterol 154 0 - 200 mg/dL   Triglycerides 56 <150 mg/dL   HDL 68 >40 mg/dL   Total CHOL/HDL Ratio 2.3 RATIO   VLDL 11 0 - 40 mg/dL   LDL Cholesterol 75 0 - 99 mg/dL    Comment:        Total Cholesterol/HDL:CHD Risk Coronary Heart Disease Risk Table                     Men   Women  1/2 Average Risk   3.4   3.3  Average Risk       5.0   4.4  2 X  Average Risk   9.6   7.1  3 X Average Risk  23.4   11.0        Use the calculated Patient Ratio above and the CHD Risk Table to determine the patient's CHD Risk.        ATP III CLASSIFICATION (LDL):  <100     mg/dL   Optimal  100-129  mg/dL   Near or Above  Optimal  130-159  mg/dL   Borderline  160-189  mg/dL   High  >190     mg/dL   Very High   Glucose, capillary     Status: Abnormal   Collection Time: 11/13/16  6:41 AM  Result Value Ref Range   Glucose-Capillary 126 (H) 65 - 99 mg/dL   Comment 1 Notify RN    Comment 2 Document in Chart   Glucose, capillary     Status: Abnormal   Collection Time: 11/13/16 12:08 PM  Result Value Ref Range   Glucose-Capillary 246 (H) 65 - 99 mg/dL   Comment 1 Notify RN    Comment 2 Document in Chart   Glucose, capillary     Status: Abnormal   Collection Time: 11/13/16  4:25 PM  Result Value Ref Range   Glucose-Capillary 147 (H) 65 - 99 mg/dL   Comment 1 Notify RN    Comment 2 Document in Chart   Rapid urine drug screen (hospital performed)     Status: None   Collection Time: 11/13/16  6:12 PM  Result Value Ref Range   Opiates NONE DETECTED NONE DETECTED   Cocaine NONE DETECTED NONE DETECTED   Benzodiazepines NONE DETECTED NONE DETECTED   Amphetamines NONE DETECTED NONE DETECTED   Tetrahydrocannabinol NONE DETECTED NONE DETECTED   Barbiturates NONE DETECTED NONE DETECTED    Comment:        DRUG SCREEN FOR MEDICAL PURPOSES ONLY.  IF CONFIRMATION IS NEEDED FOR ANY PURPOSE, NOTIFY LAB WITHIN 5 DAYS.        LOWEST DETECTABLE LIMITS FOR URINE DRUG SCREEN Drug Class       Cutoff (ng/mL) Amphetamine      1000 Barbiturate      200 Benzodiazepine   628 Tricyclics       315 Opiates          300 Cocaine          300 THC              50   Glucose, capillary     Status: Abnormal   Collection Time: 11/13/16  9:45 PM  Result Value Ref Range   Glucose-Capillary 53 (L) 65 - 99 mg/dL   Comment 1 Notify RN    Comment 2  Document in Chart   Glucose, capillary     Status: Abnormal   Collection Time: 11/13/16 10:50 PM  Result Value Ref Range   Glucose-Capillary 155 (H) 65 - 99 mg/dL   Comment 1 Notify RN    Comment 2 Document in Chart   Glucose, capillary     Status: Abnormal   Collection Time: 11/14/16  6:27 AM  Result Value Ref Range   Glucose-Capillary 181 (H) 65 - 99 mg/dL   Comment 1 Notify RN    Comment 2 Document in Chart   Glucose, capillary     Status: Abnormal   Collection Time: 11/14/16 12:05 PM  Result Value Ref Range   Glucose-Capillary 146 (H) 65 - 99 mg/dL     Lipid Panel     Component Value Date/Time   CHOL 154 11/13/2016 0321   TRIG 56 11/13/2016 0321   HDL 68 11/13/2016 0321   CHOLHDL 2.3 11/13/2016 0321   VLDL 11 11/13/2016 0321   LDLCALC 75 11/13/2016 0321     Lab Results  Component Value Date   HGBA1C 7.9 (H) 11/13/2016     Lab Results  Component Value Date   LDLCALC 75 11/13/2016  CREATININE 0.90 11/13/2016     HPI :  Bijan B Baxteris an 83 y.o.malewith known diabetes, cholesterol, hypertension who has never had a TIA or stroke in the past. Patient states that he had a sudden onset of slurred speech last night that occurred around 7 PM 11/11/2016 ([facility]). This lasted for approximately 5 minutes. Patient noted that his blood pressure which usually runs approximately 295/18 was up systolically in the 841Y. Patient also had a headache at that time. They did not seek any medical attention at that time. Patient awoke with a headache at 1:00 this morning and again today his blood pressure noting it was elevated. He also noted that his right hand has some tingling chest fully resolved at this time. For this reason he was brought to the emergency department by his wife. Patient slurred speech and his right hand tingling is fully resolved however he still has a mild headache. Blood pressure has improved in his systolically in the 606T. Patient states he does take a baby  aspirin every day and has not missed a dose. States his last A1c was approximately 8.0. And states he has good cholesterol but does not know the number. Patient was not administered IV t-PA secondary to out of window and symptoms resolved. He was admitted for further evaluation and treatment.  HOSPITAL COURSE:    TIA most likely, risk factor including uncontrolled HTN, DM and HTN, but complicated migraine is in the DDx  CT head no acute stroke. small vessel disease.  MRI  No acute stroke.   MRA  normal  Carotid Doppler   -39% ICA plaquing. Vertebral artery flow is antegrade  2D Echo   LV EF: 60% -   65%  LDL 75  HgbA1c 7.9  UDS negative   Heparin 5000 units sq tid for VTE prophylaxis  Diet heart healthy/carb modified Room service appropriate? Yes; Fluid consistency: Thin   on aspirin 81 mg daily. Recommend to change to plavix for further stroke prevention  Patient counseled to be compliant with his antithrombotic medications  Ongoing aggressive stroke risk factor management  Therapy recommendations:  no PT follow up   HTN - poor control presentation Continue Norvasc and Hyzaar and adjust doses if needed  DM type II Continue metformin, continue Humalog and sliding scale insulin, hemoglobin A1c 7.9  Renal insufficiency- it is unclear acute kidney injury versus exacerbation of underlying chronic kidney disease as we do not have any other creatinine for comparison. Creatinine normal prior to discharge   Discharge Exam:   Blood pressure (!) 151/77, pulse 86, temperature 97.6 F (36.4 C), temperature source Oral, resp. rate 18, height _0  (1.905 m), weight 106.7 kg (235 lb 4 oz), SpO2 95 %.   General: Appears calm and comfortable  Eyes: PERRLA, EOMI, normal lids, iris  ENT:  grossly normal hearing, lips & tongue, mucous membranes moist and intact  Neck: no lymphoadenopathy, masses or thyromegaly  Cardiovascular: RRR, no m/r/g. No JVD, carotid bruits. Mild LE  edema noted.    Respiratory: bilateral no wheezes, rales, rhonchi or cracles. Normal respiratory effort. No accessory muscle use observed  Abdomen: soft, non-tender, non-distended, no organomegaly or masses appreciated. BS present in all quadrants  Skin: no rash, ulcers or induration seen on limited exam  Musculoskeletal: grossly normal tone BUE/BLE, good ROM, no bony abnormality or joint deformities observed  Psychiatric: grossly normal mood and affect, speech fluent and appropriate, alert and oriented x3  Neurologic: CN II-XII grossly intact, moves all extremities  in coordinated fashion, sensation intact    Follow-up Information    [name], MD. Call.   Specialty:  Internal Medicine Why:  To make hospital follow up appointment, in 3-5 days Contact information: [address] [place] [address] [phone]           Signed: Reyne Dumas 11/14/2016, 1:20 PM        Time spent >45 mins

## 2016-11-14 NOTE — Progress Notes (Signed)
Pt discharge education and instructions completed with pt at bedside; pt voices understanding and denies any questions. Pt IV and telemetry removed; pt discharge home with spouse to transport him home. Pt to pick up electronically sent prescription from preferred pharmacy on file. Pt transported off unit via wheelchair with belongings to the side. Delia Heady RN

## 2016-11-14 NOTE — Progress Notes (Signed)
Procedures CCRITICAL VALUE ALERT  Critical value received: 54  Date of notification: 11/13/16  Time of notification:  2145  Critical value read back  Nurse who received alert: RN Verdene Lennert  MD notified (1st page):Nurse practitioner  Time of first page:  0100  MD notified (2nd page):  Time of second page:  Responding MD:  PA Donnal Debar  Time MD responded:  0100 O J given by RN CBG rechecked after 30 min  155

## 2016-11-14 NOTE — Progress Notes (Signed)
STROKE TEAM PROGRESS NOTE   HISTORY OF PRESENT ILLNESS (per record) Marcus Johnston is an 83 y.o. male with known diabetes, cholesterol, hypertension who has never had a TIA or stroke in the past. Patient states that he had a sudden onset of slurred speech last night that occurred around 7 PM 11/11/2016 ([facility]). This lasted for approximately 5 minutes. Patient noted that his blood pressure which usually runs approximately 425/95 was up systolically in the 638V. Patient also had a headache at that time. They did not seek any medical attention at that time. Patient awoke with a headache at 1:00 this morning and again today his blood pressure noting it was elevated. He also noted that his right hand has some tingling chest fully resolved at this time. For this reason he was brought to the emergency department by his wife. Patient slurred speech and his right hand tingling is fully resolved however he still has a mild headache. Blood pressure has improved in his systolically in the 564P. Patient states he does take a baby aspirin every day and has not missed a dose. States his last A1c was approximately 8.0. And states he has good cholesterol but does not know the number. Patient was not administered IV t-PA secondary to out of window and symptoms resolved. He was admitted for further evaluation and treatment.   SUBJECTIVE (INTERVAL HISTORY) No family at bedside. Pt has no complaints OBJECTIVE Temp:  [97.6 F (36.4 C)-97.8 F (36.6 C)] 97.6 F (36.4 C) (03/31 0957) Pulse Rate:  [71-86] 86 (03/31 0957) Cardiac Rhythm: Normal sinus rhythm (03/31 0700) Resp:  [18] 18 (03/31 0957) BP: (151-176)/(75-95) 151/77 (03/31 0957) SpO2:  [95 %-99 %] 95 % (03/31 0957)  CBC:   Recent Labs Lab 11/12/16 0307 11/13/16 0321  WBC  --  5.1  HGB 14.3 13.1  HCT 42.0 39.9  MCV  --  82.4  PLT  --  329    Basic Metabolic Panel:   Recent Labs Lab 11/12/16 0307 11/12/16 1435 11/13/16 0321  NA 143  --  140  K  4.0  --  3.4*  CL 103  --  103  CO2  --   --  29  GLUCOSE 179*  --  116*  BUN 21*  --  12  CREATININE 1.40* 1.13 0.90  CALCIUM  --   --  9.4    Lipid Panel:     Component Value Date/Time   CHOL 154 11/13/2016 0321   TRIG 56 11/13/2016 0321   HDL 68 11/13/2016 0321   CHOLHDL 2.3 11/13/2016 0321   VLDL 11 11/13/2016 0321   LDLCALC 75 11/13/2016 0321   HgbA1c:  Lab Results  Component Value Date   HGBA1C 7.9 (H) 11/13/2016   Urine Drug Screen:     Component Value Date/Time   LABOPIA NONE DETECTED 11/13/2016 1812   COCAINSCRNUR NONE DETECTED 11/13/2016 1812   LABBENZ NONE DETECTED 11/13/2016 1812   AMPHETMU NONE DETECTED 11/13/2016 1812   THCU NONE DETECTED 11/13/2016 1812   LABBARB NONE DETECTED 11/13/2016 1812      IMAGING I have personally reviewed the radiological images below and agree with the radiology interpretations.  Ct Head Wo Contrast 11/12/2016 1.  No acute intracranial abnormality identified. 2. Patchy bilateral cerebral white matter hypodensity, most commonly due to chronic small vessel disease. 3. Posterior vertex increased dural thickening and calcification without definite dural based mass.   Mr Brain Wo Contrast 11/12/2016 1. No acute intracranial abnormality identified. 2. Moderate  chronic microvascular ischemic changes and moderate parenchymal volume loss of the brain. 3. Scattered punctate foci of susceptibility hypointensity compatible with hemosiderin deposition, probably related to chronic hypertension, less likely cerebral amyloid angiopathy.   Mr Lovenia Kim 11/12/2016 4. Normal MRA of the head.   CUS 1-39% ICA plaquing. Vertebral artery flow is antegrade.  TTELeft ventricle: The cavity size was normal. There was mild   concentric hypertrophy. Systolic function was normal. The   estimated ejection fraction was in the range of 60% to 65%. Wall   motion was normal; there were no regional wall motion   abnormalities   PHYSICAL EXAM  Temp:   [97.6 F (36.4 C)-97.8 F (36.6 C)] 97.6 F (36.4 C) (03/31 0957) Pulse Rate:  [71-86] 86 (03/31 0957) Resp:  [18] 18 (03/31 0957) BP: (151-176)/(75-95) 151/77 (03/31 0957) SpO2:  [95 %-99 %] 95 % (03/31 0957)  General - Well nourished, well developed, in no apparent distress.  Ophthalmologic - Sharp disc margins OU.   Cardiovascular - Regular rate and rhythm.  Mental Status -  Level of arousal and orientation to time, place, and person were intact. Language including expression, naming, repetition, comprehension was assessed and found intact. Fund of Knowledge was assessed and was intact.  Cranial Nerves II - XII - II - Visual field intact OU. III, IV, VI - Extraocular movements intact. V - Facial sensation intact bilaterally. VII - Facial movement intact bilaterally. VIII - Hearing & vestibular intact bilaterally. X - Palate elevates symmetrically. XI - Chin turning & shoulder shrug intact bilaterally. XII - Tongue protrusion intact.  Motor Strength - The patient's strength was normal in all extremities and pronator drift was absent.  Bulk was normal and fasciculations were absent.   Motor Tone - Muscle tone was assessed at the neck and appendages and was normal.  Reflexes - The patient's reflexes were 1+ in all extremities and he had no pathological reflexes.  Sensory - Light touch, temperature/pinprick were assessed and were symmetrical.    Coordination - The patient had normal movements in the hands with no ataxia or dysmetria.  Tremor was absent.  Gait and Station - deferred   ASSESSMENT/PLAN Mr. Marcus Johnston is a 83 y.o. male with history of HTN, Hyperlipidemia, DM type II  presenting with facial droop and slurred speech. He did not receive IV t-PA due to out of window and symptoms resolved.   TIA most likely, risk factor including uncontrolled HTN, DM and HTN, but complicated migraine is in the DDx  CT head no acute stroke. small vessel disease.  MRI  No  acute stroke.   MRA  normal  Carotid Doppler  1-39% bilateral stenosis 2D Echo  Left ventricle: The cavity size was normal. There was mild   concentric hypertrophy. Systolic function was normal. The   estimated ejection fraction was in the range of 60% to 65%. Wall   motion was normal; there were no regional wall motion    abnormalities  LDL 75  HgbA1c 7.9  UDS negative  Heparin 5000 units sq tid for VTE prophylaxis Diet heart healthy/carb modified Room service appropriate? Yes; Fluid consistency: Thin Diet - low sodium heart healthy  aspirin 81 mg daily prior to admission, now on aspirin 81 mg daily. Recommend to change to plavix for further stroke prevention  Patient counseled to be compliant with his antithrombotic medications  Ongoing aggressive stroke risk factor management  Therapy recommendations:  pending   Disposition:  pending   Hypertension  Elevated 150-200s  Permissive hypertension (OK if <220/120) for 24-48 hours post stroke and then gradually normalized within 5-7 days.  Long term BP goal normotensive  Hyperlipidemia  Home meds:  No statin  LDL 75  Pt has allergy to statin  Hold off statins for now  Diabetes type II  HgbA1c pending, goal < 7.0  Recent A1C with PCP 8.2  Hyperglycemia  SSI  CBG monitoring  Close PCP follow up  Other Stroke Risk Factors  Advanced age  Other Active Problems Renal insufficiency  discussed with Dr. Allyson Sabal. Stroke team will sign off. Follow-up as an outpatient in stroke clinic in 6 weeks.   Antony Contras, MD Stroke Neurology 11/14/2016 4:28 PM    To contact Stroke Continuity provider, please refer to [email or web address] After hours, contact General Neurology

## 2016-11-27 DIAGNOSIS — N182 Chronic kidney disease, stage 2 (mild): Secondary | ICD-10-CM | POA: Diagnosis not present

## 2016-11-27 DIAGNOSIS — I129 Hypertensive chronic kidney disease with stage 1 through stage 4 chronic kidney disease, or unspecified chronic kidney disease: Secondary | ICD-10-CM | POA: Diagnosis not present

## 2016-11-27 DIAGNOSIS — Z79899 Other long term (current) drug therapy: Secondary | ICD-10-CM | POA: Diagnosis not present

## 2016-11-27 DIAGNOSIS — G459 Transient cerebral ischemic attack, unspecified: Secondary | ICD-10-CM | POA: Diagnosis not present

## 2016-12-10 DIAGNOSIS — E1165 Type 2 diabetes mellitus with hyperglycemia: Secondary | ICD-10-CM | POA: Diagnosis not present

## 2016-12-16 ENCOUNTER — Encounter: Payer: Self-pay | Admitting: Podiatry

## 2016-12-16 ENCOUNTER — Ambulatory Visit (INDEPENDENT_AMBULATORY_CARE_PROVIDER_SITE_OTHER): Payer: Medicare Other | Admitting: Podiatry

## 2016-12-16 DIAGNOSIS — M79676 Pain in unspecified toe(s): Secondary | ICD-10-CM | POA: Diagnosis not present

## 2016-12-16 DIAGNOSIS — B351 Tinea unguium: Secondary | ICD-10-CM | POA: Diagnosis not present

## 2016-12-16 DIAGNOSIS — E119 Type 2 diabetes mellitus without complications: Secondary | ICD-10-CM

## 2016-12-16 NOTE — Progress Notes (Signed)
Patient ID: Marcus Johnston, male   DOB: August 29, 1930, 83 y.o.   MRN: [id number] Complaint:  Visit Type: Patient returns to my office for continued preventative foot care services. Complaint: Patient states" my nails have grown long and thick and become painful to walk and wear shoes" Patient has been diagnosed with DM with no foot complications. The patient presents for preventative foot care services. No changes to ROS  Podiatric Exam: Vascular: dorsalis pedis and posterior tibial pulses are palpable bilateral. Capillary return is immediate. Temperature gradient is WNL. Skin turgor WNL  Sensorium: Normal Semmes Weinstein monofilament test. Normal tactile sensation bilaterally. Nail Exam: Pt has thick disfigured discolored nails with subungual debris noted bilateral entire nail hallux through fifth toenails Ulcer Exam: There is no evidence of ulcer or pre-ulcerative changes or infection. Orthopedic Exam: Muscle tone and strength are WNL. No limitations in general ROM. No crepitus or effusions noted. Foot type and digits show no abnormalities. Bony prominences are unremarkable. Skin: No Porokeratosis. No infection or ulcers  Diagnosis:  Onychomycosis, , Pain in right toe, pain in left toes  Treatment & Plan Procedures and Treatment: Consent by patient was obtained for treatment procedures. The patient understood the discussion of treatment and procedures well. All questions were answered thoroughly reviewed. Debridement of mycotic and hypertrophic toenails, 1 through 5 bilateral and clearing of subungual debris. No ulceration, no infection noted.  Return Visit-Office Procedure: Patient instructed to return to the office for a follow up visit 3 months for continued evaluation and treatment.

## 2016-12-25 DIAGNOSIS — E113313 Type 2 diabetes mellitus with moderate nonproliferative diabetic retinopathy with macular edema, bilateral: Secondary | ICD-10-CM | POA: Diagnosis not present

## 2016-12-25 DIAGNOSIS — Z961 Presence of intraocular lens: Secondary | ICD-10-CM | POA: Diagnosis not present

## 2016-12-25 DIAGNOSIS — H35353 Cystoid macular degeneration, bilateral: Secondary | ICD-10-CM | POA: Diagnosis not present

## 2017-01-05 DIAGNOSIS — E113312 Type 2 diabetes mellitus with moderate nonproliferative diabetic retinopathy with macular edema, left eye: Secondary | ICD-10-CM | POA: Diagnosis not present

## 2017-01-05 DIAGNOSIS — Z961 Presence of intraocular lens: Secondary | ICD-10-CM | POA: Diagnosis not present

## 2017-01-05 DIAGNOSIS — E113311 Type 2 diabetes mellitus with moderate nonproliferative diabetic retinopathy with macular edema, right eye: Secondary | ICD-10-CM | POA: Diagnosis not present

## 2017-01-05 DIAGNOSIS — H31091 Other chorioretinal scars, right eye: Secondary | ICD-10-CM | POA: Diagnosis not present

## 2017-01-05 DIAGNOSIS — H31092 Other chorioretinal scars, left eye: Secondary | ICD-10-CM | POA: Diagnosis not present

## 2017-01-14 DIAGNOSIS — N182 Chronic kidney disease, stage 2 (mild): Secondary | ICD-10-CM | POA: Diagnosis not present

## 2017-01-14 DIAGNOSIS — E1122 Type 2 diabetes mellitus with diabetic chronic kidney disease: Secondary | ICD-10-CM | POA: Diagnosis not present

## 2017-01-14 DIAGNOSIS — E559 Vitamin D deficiency, unspecified: Secondary | ICD-10-CM | POA: Diagnosis not present

## 2017-01-14 DIAGNOSIS — Z0000 Encounter for general adult medical examination without abnormal findings: Secondary | ICD-10-CM | POA: Diagnosis not present

## 2017-01-14 DIAGNOSIS — N183 Chronic kidney disease, stage 3 (moderate): Secondary | ICD-10-CM | POA: Diagnosis not present

## 2017-01-14 DIAGNOSIS — I129 Hypertensive chronic kidney disease with stage 1 through stage 4 chronic kidney disease, or unspecified chronic kidney disease: Secondary | ICD-10-CM | POA: Diagnosis not present

## 2017-01-14 DIAGNOSIS — N08 Glomerular disorders in diseases classified elsewhere: Secondary | ICD-10-CM | POA: Diagnosis not present

## 2017-01-14 DIAGNOSIS — Z794 Long term (current) use of insulin: Secondary | ICD-10-CM | POA: Diagnosis not present

## 2017-01-14 DIAGNOSIS — I10 Essential (primary) hypertension: Secondary | ICD-10-CM | POA: Diagnosis not present

## 2017-01-21 DIAGNOSIS — E113312 Type 2 diabetes mellitus with moderate nonproliferative diabetic retinopathy with macular edema, left eye: Secondary | ICD-10-CM | POA: Diagnosis not present

## 2017-01-28 DIAGNOSIS — E113311 Type 2 diabetes mellitus with moderate nonproliferative diabetic retinopathy with macular edema, right eye: Secondary | ICD-10-CM | POA: Diagnosis not present

## 2017-03-02 DIAGNOSIS — E113312 Type 2 diabetes mellitus with moderate nonproliferative diabetic retinopathy with macular edema, left eye: Secondary | ICD-10-CM | POA: Diagnosis not present

## 2017-03-11 DIAGNOSIS — E1165 Type 2 diabetes mellitus with hyperglycemia: Secondary | ICD-10-CM | POA: Diagnosis not present

## 2017-03-19 ENCOUNTER — Encounter: Payer: Self-pay | Admitting: Podiatry

## 2017-03-19 ENCOUNTER — Ambulatory Visit (INDEPENDENT_AMBULATORY_CARE_PROVIDER_SITE_OTHER): Payer: Medicare Other | Admitting: Podiatry

## 2017-03-19 DIAGNOSIS — M79676 Pain in unspecified toe(s): Secondary | ICD-10-CM | POA: Diagnosis not present

## 2017-03-19 DIAGNOSIS — B351 Tinea unguium: Secondary | ICD-10-CM | POA: Diagnosis not present

## 2017-03-19 DIAGNOSIS — E119 Type 2 diabetes mellitus without complications: Secondary | ICD-10-CM | POA: Diagnosis not present

## 2017-03-19 NOTE — Progress Notes (Signed)
Patient ID: Marcus Johnston, male   DOB: 19-Mar-1931, 83 y.o.   MRN: [id number] Complaint:  Visit Type: Patient returns to my office for continued preventative foot care services. Complaint: Patient states" my nails have grown long and thick and become painful to walk and wear shoes" Patient has been diagnosed with DM with no foot complications. The patient presents for preventative foot care services. No changes to ROS  Podiatric Exam: Vascular: dorsalis pedis and posterior tibial pulses are palpable bilateral. Capillary return is immediate. Temperature gradient is WNL. Skin turgor WNL  Sensorium: Normal Semmes Weinstein monofilament test. Normal tactile sensation bilaterally. Nail Exam: Pt has thick disfigured discolored nails with subungual debris noted bilateral entire nail hallux through fifth toenails Ulcer Exam: There is no evidence of ulcer or pre-ulcerative changes or infection. Orthopedic Exam: Muscle tone and strength are WNL. No limitations in general ROM. No crepitus or effusions noted. Foot type and digits show no abnormalities. Bony prominences are unremarkable. Skin: No Porokeratosis. No infection or ulcers  Diagnosis:  Onychomycosis, , Pain in right toe, pain in left toes  Treatment & Plan Procedures and Treatment: Consent by patient was obtained for treatment procedures. The patient understood the discussion of treatment and procedures well. All questions were answered thoroughly reviewed. Debridement of mycotic and hypertrophic toenails, 1 through 5 bilateral and clearing of subungual debris. No ulceration, no infection noted.  Return Visit-Office Procedure: Patient instructed to return to the office for a follow up visit 3 months for continued evaluation and treatment.

## 2017-03-23 DIAGNOSIS — E113311 Type 2 diabetes mellitus with moderate nonproliferative diabetic retinopathy with macular edema, right eye: Secondary | ICD-10-CM | POA: Diagnosis not present

## 2017-04-13 DIAGNOSIS — E113312 Type 2 diabetes mellitus with moderate nonproliferative diabetic retinopathy with macular edema, left eye: Secondary | ICD-10-CM | POA: Diagnosis not present

## 2017-04-27 DIAGNOSIS — E113311 Type 2 diabetes mellitus with moderate nonproliferative diabetic retinopathy with macular edema, right eye: Secondary | ICD-10-CM | POA: Diagnosis not present

## 2017-06-01 DIAGNOSIS — E113312 Type 2 diabetes mellitus with moderate nonproliferative diabetic retinopathy with macular edema, left eye: Secondary | ICD-10-CM | POA: Diagnosis not present

## 2017-06-11 DIAGNOSIS — E1165 Type 2 diabetes mellitus with hyperglycemia: Secondary | ICD-10-CM | POA: Diagnosis not present

## 2017-06-18 ENCOUNTER — Ambulatory Visit: Payer: Medicare Other | Admitting: Podiatry

## 2017-06-24 DIAGNOSIS — E113311 Type 2 diabetes mellitus with moderate nonproliferative diabetic retinopathy with macular edema, right eye: Secondary | ICD-10-CM | POA: Diagnosis not present

## 2017-07-16 DIAGNOSIS — N183 Chronic kidney disease, stage 3 (moderate): Secondary | ICD-10-CM | POA: Diagnosis not present

## 2017-07-16 DIAGNOSIS — Z794 Long term (current) use of insulin: Secondary | ICD-10-CM | POA: Diagnosis not present

## 2017-07-16 DIAGNOSIS — I10 Essential (primary) hypertension: Secondary | ICD-10-CM | POA: Diagnosis not present

## 2017-07-16 DIAGNOSIS — E1122 Type 2 diabetes mellitus with diabetic chronic kidney disease: Secondary | ICD-10-CM | POA: Diagnosis not present

## 2017-08-06 DIAGNOSIS — E785 Hyperlipidemia, unspecified: Secondary | ICD-10-CM | POA: Diagnosis not present

## 2017-08-06 DIAGNOSIS — I129 Hypertensive chronic kidney disease with stage 1 through stage 4 chronic kidney disease, or unspecified chronic kidney disease: Secondary | ICD-10-CM | POA: Diagnosis not present

## 2017-08-06 DIAGNOSIS — N182 Chronic kidney disease, stage 2 (mild): Secondary | ICD-10-CM | POA: Diagnosis not present

## 2017-08-06 DIAGNOSIS — R0982 Postnasal drip: Secondary | ICD-10-CM | POA: Diagnosis not present

## 2017-08-19 DIAGNOSIS — H31092 Other chorioretinal scars, left eye: Secondary | ICD-10-CM | POA: Diagnosis not present

## 2017-08-19 DIAGNOSIS — H43393 Other vitreous opacities, bilateral: Secondary | ICD-10-CM | POA: Diagnosis not present

## 2017-08-19 DIAGNOSIS — H40013 Open angle with borderline findings, low risk, bilateral: Secondary | ICD-10-CM | POA: Diagnosis not present

## 2017-08-19 DIAGNOSIS — E113312 Type 2 diabetes mellitus with moderate nonproliferative diabetic retinopathy with macular edema, left eye: Secondary | ICD-10-CM | POA: Diagnosis not present

## 2017-08-19 DIAGNOSIS — E113311 Type 2 diabetes mellitus with moderate nonproliferative diabetic retinopathy with macular edema, right eye: Secondary | ICD-10-CM | POA: Diagnosis not present

## 2017-08-19 DIAGNOSIS — H31091 Other chorioretinal scars, right eye: Secondary | ICD-10-CM | POA: Diagnosis not present

## 2017-09-08 DIAGNOSIS — E113312 Type 2 diabetes mellitus with moderate nonproliferative diabetic retinopathy with macular edema, left eye: Secondary | ICD-10-CM | POA: Diagnosis not present

## 2017-09-08 DIAGNOSIS — H40053 Ocular hypertension, bilateral: Secondary | ICD-10-CM | POA: Diagnosis not present

## 2017-09-08 DIAGNOSIS — E113311 Type 2 diabetes mellitus with moderate nonproliferative diabetic retinopathy with macular edema, right eye: Secondary | ICD-10-CM | POA: Diagnosis not present

## 2017-09-20 DIAGNOSIS — E113313 Type 2 diabetes mellitus with moderate nonproliferative diabetic retinopathy with macular edema, bilateral: Secondary | ICD-10-CM | POA: Diagnosis not present

## 2017-09-20 DIAGNOSIS — H35353 Cystoid macular degeneration, bilateral: Secondary | ICD-10-CM | POA: Diagnosis not present

## 2017-09-20 DIAGNOSIS — Z961 Presence of intraocular lens: Secondary | ICD-10-CM | POA: Diagnosis not present

## 2017-10-05 DIAGNOSIS — E113312 Type 2 diabetes mellitus with moderate nonproliferative diabetic retinopathy with macular edema, left eye: Secondary | ICD-10-CM | POA: Diagnosis not present

## 2017-10-26 DIAGNOSIS — H40053 Ocular hypertension, bilateral: Secondary | ICD-10-CM | POA: Diagnosis not present

## 2017-10-26 DIAGNOSIS — E113312 Type 2 diabetes mellitus with moderate nonproliferative diabetic retinopathy with macular edema, left eye: Secondary | ICD-10-CM | POA: Diagnosis not present

## 2017-10-26 DIAGNOSIS — E113311 Type 2 diabetes mellitus with moderate nonproliferative diabetic retinopathy with macular edema, right eye: Secondary | ICD-10-CM | POA: Diagnosis not present

## 2017-11-02 DIAGNOSIS — Z7984 Long term (current) use of oral hypoglycemic drugs: Secondary | ICD-10-CM | POA: Diagnosis not present

## 2017-11-02 DIAGNOSIS — E1122 Type 2 diabetes mellitus with diabetic chronic kidney disease: Secondary | ICD-10-CM | POA: Diagnosis not present

## 2017-11-02 DIAGNOSIS — I10 Essential (primary) hypertension: Secondary | ICD-10-CM | POA: Diagnosis not present

## 2017-11-02 DIAGNOSIS — Z794 Long term (current) use of insulin: Secondary | ICD-10-CM | POA: Diagnosis not present

## 2017-11-02 DIAGNOSIS — N183 Chronic kidney disease, stage 3 (moderate): Secondary | ICD-10-CM | POA: Diagnosis not present

## 2017-11-09 DIAGNOSIS — E1165 Type 2 diabetes mellitus with hyperglycemia: Secondary | ICD-10-CM | POA: Diagnosis not present

## 2017-11-16 DIAGNOSIS — E113311 Type 2 diabetes mellitus with moderate nonproliferative diabetic retinopathy with macular edema, right eye: Secondary | ICD-10-CM | POA: Diagnosis not present

## 2017-11-23 DIAGNOSIS — E1122 Type 2 diabetes mellitus with diabetic chronic kidney disease: Secondary | ICD-10-CM | POA: Diagnosis not present

## 2017-12-20 DIAGNOSIS — E113311 Type 2 diabetes mellitus with moderate nonproliferative diabetic retinopathy with macular edema, right eye: Secondary | ICD-10-CM | POA: Diagnosis not present

## 2018-01-17 DIAGNOSIS — H40013 Open angle with borderline findings, low risk, bilateral: Secondary | ICD-10-CM | POA: Diagnosis not present

## 2018-01-17 DIAGNOSIS — Z961 Presence of intraocular lens: Secondary | ICD-10-CM | POA: Diagnosis not present

## 2018-01-17 DIAGNOSIS — E113312 Type 2 diabetes mellitus with moderate nonproliferative diabetic retinopathy with macular edema, left eye: Secondary | ICD-10-CM | POA: Diagnosis not present

## 2018-01-17 DIAGNOSIS — E113311 Type 2 diabetes mellitus with moderate nonproliferative diabetic retinopathy with macular edema, right eye: Secondary | ICD-10-CM | POA: Diagnosis not present

## 2018-01-20 DIAGNOSIS — Z794 Long term (current) use of insulin: Secondary | ICD-10-CM | POA: Diagnosis not present

## 2018-01-20 DIAGNOSIS — E1165 Type 2 diabetes mellitus with hyperglycemia: Secondary | ICD-10-CM | POA: Diagnosis not present

## 2018-01-21 DIAGNOSIS — H35353 Cystoid macular degeneration, bilateral: Secondary | ICD-10-CM | POA: Diagnosis not present

## 2018-04-29 DIAGNOSIS — R609 Edema, unspecified: Secondary | ICD-10-CM

## 2018-06-10 ENCOUNTER — Other Ambulatory Visit: Payer: Self-pay | Admitting: Internal Medicine

## 2018-06-21 ENCOUNTER — Telehealth: Payer: Self-pay

## 2018-06-21 NOTE — Telephone Encounter (Signed)
Attempted to call pt regarding him taking losartan and to notify him that he needs to call his pharmacy to make sure his medication does not have a recall on it and notify us if it does so we can switch him to a different medication. Left v/m to call office [facility]

## 2018-06-21 NOTE — Telephone Encounter (Signed)
The pt called back and said that his Losartan medication isn't on the recall list at his [facility].

## 2018-07-12 ENCOUNTER — Other Ambulatory Visit: Payer: Self-pay | Admitting: Internal Medicine

## 2018-07-29 ENCOUNTER — Other Ambulatory Visit: Payer: Self-pay

## 2018-07-29 MED ORDER — LOSARTAN POTASSIUM-HCTZ 100-25 MG PO TABS: 1.0000 | ORAL_TABLET | Freq: Every day | ORAL | 1 refills | Status: DC

## 2018-08-04 ENCOUNTER — Encounter (HOSPITAL_COMMUNITY): Payer: Self-pay | Admitting: Family Medicine

## 2018-08-04 ENCOUNTER — Ambulatory Visit (HOSPITAL_COMMUNITY)
Admission: EM | Admit: 2018-08-04 | Discharge: 2018-08-04 | Disposition: A | Discharge status: Home / Self Care | Payer: Medicare Other | Attending: Family Medicine | Admitting: Family Medicine

## 2018-08-04 ENCOUNTER — Telehealth: Payer: Self-pay

## 2018-08-04 ENCOUNTER — Other Ambulatory Visit: Payer: Self-pay

## 2018-08-04 DIAGNOSIS — J069 Acute upper respiratory infection, unspecified: Secondary | ICD-10-CM | POA: Diagnosis not present

## 2018-08-04 MED ORDER — BENZONATATE 100 MG PO CAPS: 100.0000 mg | ORAL_CAPSULE | Freq: Three times a day (TID) | ORAL | 0 refills | Status: DC | PRN

## 2018-08-04 MED ORDER — ALBUTEROL SULFATE HFA 108 (90 BASE) MCG/ACT IN AERS: 2.0000 | INHALATION_SPRAY | RESPIRATORY_TRACT | 1 refills | Status: DC | PRN

## 2018-08-04 NOTE — ED Provider Notes (Signed)
[place]    CSN: [id number] Arrival date & time: 08/04/18  1249     History   Chief Complaint Chief Complaint  Patient presents with  . Cough  . Otalgia    HPI Marcus Johnston is a 84 y.o. male.   This is the initial visit for this 84 year old gentleman.  He has had problem with cough and congestion.  He has been sick for 5 days.  His cough is nonproductive.  He has had no fever.  He has had some hoarseness.  He has had no GI symptoms.    He has a history of hypertension, diabetes, renal insufficiency, and TIA.  Patient is retired Pharmacist, community. His blood sugars have been running under 150.     Past Medical History:  Diagnosis Date  . Diabetes mellitus without complication [facility])     Patient Active Problem List   Diagnosis Date Noted  . Hyperglycemia   . TIA (transient ischemic attack) 11/12/2016  . Uncontrolled hypertension 11/12/2016  . DM (diabetes mellitus) ([place]) 11/12/2016  . Renal insufficiency 11/12/2016    History reviewed. No pertinent surgical history.     Home Medications    Prior to Admission medications   Medication Sig Start Date End Date Taking? Authorizing Provider  albuterol (PROVENTIL HFA;VENTOLIN HFA) 108 (90 Base) MCG/ACT inhaler Inhale 2 puffs into the lungs every 4 (four) hours as needed for wheezing or shortness of breath (cough, shortness of breath or wheezing.). 08/04/18   Robyn Haber, MD  amLODipine (NORVASC) 10 MG tablet TAKE 1 TABLET BY MOUTH  EVERY DAY 06/14/18   Glendale Chard, MD  amLODipine (NORVASC) 5 MG tablet Take 5 mg by mouth daily.    [provider]  benzonatate (TESSALON) 100 MG capsule Take 1-2 capsules (100-200 mg total) by mouth 3 (three) times daily as needed for cough. 08/04/18   Robyn Haber, MD  cholecalciferol (VITAMIN D) 1000 units tablet Take 1,000 Units by mouth daily.    [provider]  cholecalciferol 1000 units tablet Take 1 tablet (1,000 Units total) by mouth  daily. 11/15/16   Reyne Dumas, MD  clopidogrel (PLAVIX) 75 MG tablet Take 1 tablet (75 mg total) by mouth daily. 11/15/16   Reyne Dumas, MD  doxazosin (CARDURA) 4 MG tablet TAKE 1 TABLET BY MOUTH  DAILY 07/13/18   Glendale Chard, MD  fexofenadine (ALLEGRA) 180 MG tablet Take 180 mg by mouth daily.    [provider]  HUMALOG MIX 50/50 KWIKPEN (50-50) 100 UNIT/ML Kwikpen Inject 24 Units into the skin 2 (two) times daily.  09/20/13   [provider]  losartan-hydrochlorothiazide (HYZAAR) 100-25 MG tablet Take 1 tablet by mouth daily. 07/29/18   Glendale Chard, MD  metFORMIN (GLUCOPHAGE-XR) 500 MG 24 hr tablet Take 2,000 mg by mouth every evening.    [provider]    Family History History reviewed. No pertinent family history.  Social History Social History   Tobacco Use  . Smoking status: Never Smoker  . Smokeless tobacco: Never Used  Substance Use Topics  . Alcohol use: No  . Drug use: No     Allergies   Statins   Review of Systems Review of Systems   Physical Exam Triage Vital Signs ED Triage Vitals [08/04/18 1426]  Enc Vitals Group     BP 131/77     Pulse Rate 81     Resp 16     Temp 98 F (36.7 C)     Temp Source  Oral     SpO2 99 %     Weight      Height      Head Circumference      Peak Flow      Pain Score      Pain Loc      Pain Edu?      Excl. in [place]?    No data found.  Updated Vital Signs BP 131/77 (BP Location: Right Arm)   Pulse 81   Temp 98 F (36.7 C) (Oral)   Resp 16   Wt 106.1 kg   SpO2 99%   BMI 29.25 kg/m    Physical Exam Vitals signs and nursing note reviewed.  Constitutional:      Appearance: Normal appearance. He is not ill-appearing.  HENT:     Head: Normocephalic.     Right Ear: Tympanic membrane, ear canal and external ear normal.     Left Ear: Tympanic membrane, ear canal and external ear normal.     Nose: Nose normal.     Mouth/Throat:     Mouth: Mucous membranes are moist.  Eyes:      Conjunctiva/sclera: Conjunctivae normal.  Cardiovascular:     Rate and Rhythm: Normal rate.     Heart sounds: Normal heart sounds.  Pulmonary:     Effort: Pulmonary effort is normal.     Breath sounds: Rhonchi present.  Musculoskeletal: Normal range of motion.  Skin:    General: Skin is warm and dry.  Neurological:     General: No focal deficit present.     Mental Status: He is alert.  Psychiatric:        Mood and Affect: Mood normal.      UC Treatments / Results  Labs (all labs ordered are listed, but only abnormal results are displayed) Labs Reviewed - No data to display  EKG None  Radiology No results found.  Procedures Procedures (including critical care time)  Medications Ordered in UC Medications - No data to display  Initial Impression / Assessment and Plan / UC Course  I have reviewed the triage vital signs and the nursing notes.  Pertinent labs & imaging results that were available during my care of the patient were reviewed by me and considered in my medical decision making (see chart for details).    Final Clinical Impressions(s) / UC Diagnoses   Final diagnoses:  Viral upper respiratory tract infection     Discharge Instructions     This is a viral bronchitis.  The medicines prescribed to help open up your bronchial tubes so he can clear out the phlegm.  Because it is viral, antibiotics will not be helpful at this point.    ED Prescriptions    Medication Sig Dispense Auth. Provider   benzonatate (TESSALON) 100 MG capsule Take 1-2 capsules (100-200 mg total) by mouth 3 (three) times daily as needed for cough. 20 capsule Robyn Haber, MD   albuterol (PROVENTIL HFA;VENTOLIN HFA) 108 (90 Base) MCG/ACT inhaler Inhale 2 puffs into the lungs every 4 (four) hours as needed for wheezing or shortness of breath (cough, shortness of breath or wheezing.). [place], MD     Controlled Substance Prescriptions [place] Controlled Substance  Registry consulted? Not Applicable   Robyn Haber, MD 08/04/18 1438

## 2018-08-04 NOTE — ED Triage Notes (Signed)
Pt cc ear discomfort and cough and wheezing x 5 days.

## 2018-08-04 NOTE — Telephone Encounter (Signed)
Left the pt a message that I was returning his call to schedule him an appt for his cold.

## 2018-08-04 NOTE — Discharge Instructions (Addendum)
This is a viral bronchitis.  The medicines prescribed to help open up your bronchial tubes so he can clear out the phlegm.  Because it is viral, antibiotics will not be helpful at this point.

## 2018-09-12 ENCOUNTER — Ambulatory Visit (INDEPENDENT_AMBULATORY_CARE_PROVIDER_SITE_OTHER): Payer: Medicare Other | Admitting: Internal Medicine

## 2018-09-12 ENCOUNTER — Encounter: Payer: Self-pay | Admitting: Internal Medicine

## 2018-09-12 VITALS — BP 110/74 | HR 81 | Temp 97.4°F | Ht 75.0 in | Wt 239.2 lb

## 2018-09-12 DIAGNOSIS — I129 Hypertensive chronic kidney disease with stage 1 through stage 4 chronic kidney disease, or unspecified chronic kidney disease: Secondary | ICD-10-CM | POA: Diagnosis not present

## 2018-09-12 DIAGNOSIS — Z8546 Personal history of malignant neoplasm of prostate: Secondary | ICD-10-CM

## 2018-09-12 DIAGNOSIS — E1159 Type 2 diabetes mellitus with other circulatory complications: Secondary | ICD-10-CM | POA: Diagnosis not present

## 2018-09-12 DIAGNOSIS — E7800 Pure hypercholesterolemia, unspecified: Secondary | ICD-10-CM | POA: Diagnosis not present

## 2018-09-12 DIAGNOSIS — N182 Chronic kidney disease, stage 2 (mild): Secondary | ICD-10-CM

## 2018-09-12 NOTE — Patient Instructions (Signed)
Hypoglycemia Hypoglycemia is when the sugar (glucose) level in your blood is too low. Signs of low blood sugar may include:  Feeling: ? Hungry. ? Worried or nervous (anxious). ? Sweaty and clammy. ? Confused. ? Dizzy. ? Sleepy. ? Sick to your stomach (nauseous).  Having: ? A fast heartbeat. ? A headache. ? A change in your vision. ? Tingling or no feeling (numbness) around your mouth, lips, or tongue. ? Jerky movements that you cannot control (seizure).  Having trouble with: ? Moving (coordination). ? Sleeping. ? Passing out (fainting). ? Getting upset easily (irritability). Low blood sugar can happen to people who have diabetes and people who do not have diabetes. Low blood sugar can happen quickly, and it can be an emergency. Treating low blood sugar Low blood sugar is often treated by eating or drinking something sugary right away, such as:  Fruit juice, 4-6 oz (120-150 mL).  Regular soda (not diet soda), 4-6 oz (120-150 mL).  Low-fat milk, 4 oz (120 mL).  Several pieces of hard candy.  Sugar or honey, 1 Tbsp (15 mL). Treating low blood sugar if you have diabetes If you can think clearly and swallow safely, follow the 15:15 rule:  Take 15 grams of a fast-acting carb (carbohydrate). Talk with your doctor about how much you should take.  Always keep a source of fast-acting carb with you, such as: ? Sugar tablets (glucose pills). Take 3-4 pills. ? 6-8 pieces of hard candy. ? 4-6 oz (120-150 mL) of fruit juice. ? 4-6 oz (120-150 mL) of regular (not diet) soda. ? 1 Tbsp (15 mL) honey or sugar.  Check your blood sugar 15 minutes after you take the carb.  If your blood sugar is still at or below 70 mg/dL (3.9 mmol/L), take 15 grams of a carb again.  If your blood sugar does not go above 70 mg/dL (3.9 mmol/L) after 3 tries, get help right away.  After your blood sugar goes back to normal, eat a meal or a snack within 1 hour.  Treating very low blood sugar If your  blood sugar is at or below 54 mg/dL (3 mmol/L), you have very low blood sugar (severe hypoglycemia). This may also cause:  Passing out.  Jerky movements you cannot control (seizure).  Losing consciousness (coma). This is an emergency. Do not wait to see if the symptoms will go away. Get medical help right away. Call your local emergency services (911 in the [place] Do not drive yourself to the hospital. If you have very low blood sugar and you cannot eat or drink, you may need a glucagon shot (injection). A family member or friend should learn how to check your blood sugar and how to give you a glucagon shot. Ask your doctor if you need to have a glucagon shot kit at home. Follow these instructions at home: General instructions  Take over-the-counter and prescription medicines only as told by your doctor.  Stay aware of your blood sugar as told by your doctor.  Limit alcohol intake to no more than 1 drink a day for nonpregnant women and 2 drinks a day for men. One drink equals 12 oz of beer (355 mL), 5 oz of wine (148 mL), or 1 oz of hard liquor (44 mL).  Keep all follow-up visits as told by your doctor. This is important. If you have diabetes:   Follow your diabetes care plan as told by your doctor. Make sure you: ? Know the signs of low blood sugar. ?  Take your medicines as told. °? Follow your exercise and meal plan. °? Eat on time. Do not skip meals. °? Check your blood sugar as often as told by your doctor. Always check it before and after exercise. °? Follow your sick day plan when you cannot eat or drink normally. Make this plan ahead of time with your doctor. °· Share your diabetes care plan with: °? Your work or school. °? People you live with. °· Check your pee (urine) for ketones: °? When you are sick. °? As told by your doctor. °· Carry a card or wear jewelry that says you have diabetes. °Contact a doctor if: °· You have trouble keeping your blood sugar in your target  range. °· You have low blood sugar often. °Get help right away if: °· You still have symptoms after you eat or drink something sugary. °· Your blood sugar is at or below 54 mg/dL (3 mmol/L). °· You have jerky movements that you cannot control. °· You pass out. °These symptoms may be an emergency. Do not wait to see if the symptoms will go away. Get medical help right away. Call your local emergency services (911 in the [place] Do not drive yourself to the hospital. °Summary °· Hypoglycemia happens when the level of sugar (glucose) in your blood is too low. °· Low blood sugar can happen to people who have diabetes and people who do not have diabetes. Low blood sugar can happen quickly, and it can be an emergency. °· Make sure you know the signs of low blood sugar and know how to treat it. °· Always keep a source of sugar (fast-acting carb) with you to treat low blood sugar. °This information is not intended to replace advice given to you by your health care provider. Make sure you discuss any questions you have with your health care provider. °Document Released: 10/28/2009 Document Revised: 01/25/2018 Document Reviewed: 09/06/2015 °Elsevier Interactive Patient Education © 2019 [employer] ° °

## 2018-09-12 NOTE — Progress Notes (Signed)
Subjective:     Patient ID: Marcus Johnston , male    DOB: [birth year] , 85 y.o.   MRN: [id number]   Chief Complaint  Patient presents with  . Hypertension    HPI  Hypertension  This is a chronic problem. The current episode started more than 1 year ago. The problem has been gradually improving since onset. The problem is controlled. Pertinent negatives include no blurred vision, chest pain, palpitations or shortness of breath. Risk factors for coronary artery disease include diabetes mellitus, dyslipidemia, male gender and sedentary lifestyle. There are no compliance problems.      Past Medical History:  Diagnosis Date  . Diabetes mellitus without complication ([place])   . High cholesterol   . History of prostate cancer   . HTN (hypertension)   . Renal insufficiency      Family History  Problem Relation Age of Onset  . Cancer Mother   . Cancer Father      Current Outpatient Medications:  .  albuterol (PROVENTIL HFA;VENTOLIN HFA) 108 (90 Base) MCG/ACT inhaler, Inhale 2 puffs into the lungs every 4 (four) hours as needed for wheezing or shortness of breath (cough, shortness of breath or wheezing.)., Disp: 1 Inhaler, Rfl: 1 .  amLODipine (NORVASC) 10 MG tablet, TAKE 1 TABLET BY MOUTH  EVERY DAY, Disp: 90 tablet, Rfl: 1 .  aspirin EC 81 MG tablet, Take by mouth., Disp: , Rfl:  .  benzonatate (TESSALON) 100 MG capsule, Take 1-2 capsules (100-200 mg total) by mouth 3 (three) times daily as needed for cough., Disp: 20 capsule, Rfl: 0 .  cholecalciferol (VITAMIN D) 1000 units tablet, Take 1,000 Units by mouth daily., Disp: , Rfl:  .  clopidogrel (PLAVIX) 75 MG tablet, Take 1 tablet (75 mg total) by mouth daily., Disp: 15 tablet, Rfl: 1 .  doxazosin (CARDURA) 4 MG tablet, TAKE 1 TABLET BY MOUTH  DAILY, Disp: 90 tablet, Rfl: 1 .  fexofenadine (ALLEGRA) 180 MG tablet, Take 180 mg by mouth daily., Disp: , Rfl:  .  HUMALOG MIX 50/50 KWIKPEN (50-50) 100 UNIT/ML Kwikpen, Inject 24 Units into  the skin 2 (two) times daily. , Disp: , Rfl:  .  Insulin Syringe-Needle U-100 (BD INSULIN SYRINGE U/F) 31G X 5/16" 0.5 ML MISC, USE TO INJECT INSULIN AS DIRECTED, Disp: , Rfl:  .  losartan-hydrochlorothiazide (HYZAAR) 100-25 MG tablet, Take 1 tablet by mouth daily., Disp: 90 tablet, Rfl: 1 .  metFORMIN (GLUCOPHAGE-XR) 500 MG 24 hr tablet, Take 2,000 mg by mouth every evening., Disp: , Rfl:  .  prednisoLONE acetate (PRED FORTE) 1 % ophthalmic suspension, Use 1 drop both eyes 3 times a day, Disp: , Rfl:    Allergies  Allergen Reactions  . Statins Other (See Comments)    pain     Review of Systems  Constitutional: Negative.   Eyes: Negative for blurred vision.  Respiratory: Negative.  Negative for shortness of breath.   Cardiovascular: Negative.  Negative for chest pain and palpitations.  Gastrointestinal: Negative.   Neurological: Negative.   Psychiatric/Behavioral: Negative.      Today's Vitals   09/12/18 0942  BP: 110/74  Pulse: 81  Temp: (!) 97.4 F (36.3 C)  TempSrc: Oral  SpO2: 97%  Weight: 239 lb 3.2 oz (108.5 kg)  Height: _0  (1.905 m)  PainSc: 6   PainLoc: Head   Body mass index is 29.9 kg/m.   Objective:  Physical Exam Vitals signs and nursing note reviewed.  Constitutional:  Appearance: Normal appearance.  HENT:     Head: Normocephalic and atraumatic.  Cardiovascular:     Rate and Rhythm: Normal rate and regular rhythm.     Heart sounds: Normal heart sounds.  Pulmonary:     Effort: Pulmonary effort is normal.     Breath sounds: Normal breath sounds.  Skin:    General: Skin is warm.     Findings: Erythema:   Neurological:     General: No focal deficit present.     Mental Status: He is alert.  Psychiatric:        Mood and Affect: Mood normal.         Assessment And Plan:     1. Hypertensive nephropathy  Well controlled. He will continue with current meds. He is encouraged to avoid adding salt to her foods.   2. Chronic renal disease,  stage II  Chronic. I will check a GFR, Cr today.   3. Type 2 diabetes mellitus with other circulatory complications [facility])  He is now followed by Dr. Buddy Duty.  - Lipid panel - CMP14+EGFR  4. Pure hypercholesterolemia  I will check fasting lipid panel and LFTs. He will rto in six months for his next AWV/physical examination. He is encouraged to limit his intake of fried foods.   5. Personal history of prostate cancer  I will check repeat PSA per his request.i will forward a copy of his labs to his urologist.   - PSA        [name], MD

## 2018-09-13 LAB — CMP14+EGFR
ALBUMIN: 4 g/dL (ref 3.6–4.6)
ALT: 11 IU/L (ref 0–44)
AST: 18 IU/L (ref 0–40)
Albumin/Globulin Ratio: 1.7 (ref 1.2–2.2)
Alkaline Phosphatase: 78 IU/L (ref 39–117)
BUN / CREAT RATIO: 15 (ref 10–24)
BUN: 23 mg/dL (ref 8–27)
Bilirubin Total: 0.4 mg/dL (ref 0.0–1.2)
CALCIUM: 9.3 mg/dL (ref 8.6–10.2)
CO2: 20 mmol/L (ref 20–29)
Chloride: 103 mmol/L (ref 96–106)
Creatinine, Ser: 1.52 mg/dL — ABNORMAL HIGH (ref 0.76–1.27)
GFR calc Af Amer: 48 mL/min/{1.73_m2} — ABNORMAL LOW (ref >59)
GFR, EST NON AFRICAN AMERICAN: 41 mL/min/{1.73_m2} — AB (ref >59)
GLOBULIN, TOTAL: 2.3 g/dL (ref 1.5–4.5)
Glucose: 180 mg/dL — ABNORMAL HIGH (ref 65–99)
Potassium: 4.4 mmol/L (ref 3.5–5.2)
SODIUM: 140 mmol/L (ref 134–144)
TOTAL PROTEIN: 6.3 g/dL (ref 6.0–8.5)

## 2018-09-13 LAB — LIPID PANEL
CHOLESTEROL TOTAL: 150 mg/dL (ref 100–199)
Chol/HDL Ratio: 2 ratio (ref 0.0–5.0)
HDL: 75 mg/dL (ref >39)
LDL Calculated: 63 mg/dL (ref 0–99)
Triglycerides: 59 mg/dL (ref 0–149)
VLDL Cholesterol Cal: 12 mg/dL (ref 5–40)

## 2018-09-13 LAB — PSA: Prostate Specific Ag, Serum: 0.4 ng/mL (ref 0.0–4.0)

## 2018-09-14 ENCOUNTER — Telehealth: Payer: Self-pay

## 2018-09-14 NOTE — Telephone Encounter (Signed)
-----   Message from Glendale Chard, MD sent at 09/13/2018  7:51 PM EST ----- Your chol is great. Continue with current meds. Your kidney and liver fxn  Are stable. Your PSA is within nl limits

## 2018-09-14 NOTE — Telephone Encounter (Signed)
Left a message for the pt to call back for his lab results

## 2018-09-16 ENCOUNTER — Telehealth: Payer: Self-pay

## 2018-09-16 ENCOUNTER — Encounter: Payer: Self-pay | Admitting: Internal Medicine

## 2018-09-16 NOTE — Telephone Encounter (Signed)
Patient notified of recent  lab results. 

## 2018-09-25 ENCOUNTER — Encounter: Payer: Self-pay | Admitting: Internal Medicine

## 2018-10-09 ENCOUNTER — Encounter (HOSPITAL_COMMUNITY): Payer: Self-pay | Admitting: Emergency Medicine

## 2018-10-09 ENCOUNTER — Other Ambulatory Visit: Payer: Self-pay

## 2018-10-09 ENCOUNTER — Emergency Department (HOSPITAL_COMMUNITY)
Admission: EM | Admit: 2018-10-09 | Discharge: 2018-10-09 | Disposition: A | Discharge status: Home / Self Care | Payer: Medicare Other | Attending: Emergency Medicine | Admitting: Emergency Medicine

## 2018-10-09 DIAGNOSIS — E1165 Type 2 diabetes mellitus with hyperglycemia: Secondary | ICD-10-CM | POA: Diagnosis not present

## 2018-10-09 DIAGNOSIS — Z794 Long term (current) use of insulin: Secondary | ICD-10-CM | POA: Diagnosis not present

## 2018-10-09 DIAGNOSIS — Z7902 Long term (current) use of antithrombotics/antiplatelets: Secondary | ICD-10-CM | POA: Diagnosis not present

## 2018-10-09 DIAGNOSIS — Z8546 Personal history of malignant neoplasm of prostate: Secondary | ICD-10-CM | POA: Insufficient documentation

## 2018-10-09 DIAGNOSIS — R42 Dizziness and giddiness: Secondary | ICD-10-CM | POA: Diagnosis present

## 2018-10-09 DIAGNOSIS — E119 Type 2 diabetes mellitus without complications: Secondary | ICD-10-CM | POA: Diagnosis not present

## 2018-10-09 DIAGNOSIS — Z7982 Long term (current) use of aspirin: Secondary | ICD-10-CM | POA: Diagnosis not present

## 2018-10-09 DIAGNOSIS — I10 Essential (primary) hypertension: Secondary | ICD-10-CM

## 2018-10-09 DIAGNOSIS — R739 Hyperglycemia, unspecified: Secondary | ICD-10-CM

## 2018-10-09 DIAGNOSIS — Z79899 Other long term (current) drug therapy: Secondary | ICD-10-CM | POA: Diagnosis not present

## 2018-10-09 LAB — CBC
HCT: 41.4 % (ref 39.0–52.0)
HEMOGLOBIN: 12.7 g/dL — AB (ref 13.0–17.0)
MCH: 26.2 pg (ref 26.0–34.0)
MCHC: 30.7 g/dL (ref 30.0–36.0)
MCV: 85.4 fL (ref 80.0–100.0)
PLATELETS: 187 10*3/uL (ref 150–400)
RBC: 4.85 MIL/uL (ref 4.22–5.81)
RDW: 15.7 % — ABNORMAL HIGH (ref 11.5–15.5)
WBC: 3.8 10*3/uL — ABNORMAL LOW (ref 4.0–10.5)
nRBC: 0 % (ref 0.0–0.2)

## 2018-10-09 LAB — BASIC METABOLIC PANEL
ANION GAP: 10 (ref 5–15)
BUN: 19 mg/dL (ref 8–23)
CALCIUM: 9.3 mg/dL (ref 8.9–10.3)
CO2: 21 mmol/L — AB (ref 22–32)
Chloride: 108 mmol/L (ref 98–111)
Creatinine, Ser: 1.63 mg/dL — ABNORMAL HIGH (ref 0.61–1.24)
GFR calc Af Amer: 44 mL/min — ABNORMAL LOW (ref >60)
GFR calc non Af Amer: 38 mL/min — ABNORMAL LOW (ref >60)
Glucose, Bld: 323 mg/dL — ABNORMAL HIGH (ref 70–99)
Potassium: 3.9 mmol/L (ref 3.5–5.1)
Sodium: 139 mmol/L (ref 135–145)

## 2018-10-09 LAB — I-STAT TROPONIN, ED: Troponin i, poc: 0.02 ng/mL (ref 0.00–0.08)

## 2018-10-09 NOTE — ED Triage Notes (Addendum)
Pt states he believes he is having a reaction to his medications.  Reports dizziness, chest discomfort, abd discomfort, and headache after taking medications "for years".  States he has been trying to not take one each day and adjust them himself to see if symptoms improved.  Denies dizziness or pain at present.  States he has told his PCP "from time to time" about symptoms but they have been worse over the past month.

## 2018-10-09 NOTE — ED Provider Notes (Signed)
[facility] EMERGENCY DEPARTMENT Provider Note   CSN: [id number] Arrival date & time: 10/09/18  2042    History   Chief Complaint Chief Complaint  Patient presents with  . Dizziness  . Chest Pain  . Abdominal Pain    HPI Marcus Johnston is a 85 y.o. male.     Patient presents to the emergency department with multiple complaints.  He reports her symptoms have been ongoing for approximately a week.  He has noticed that about 3 hours after he takes his medications he starts to feel poorly.  He has noticed dizziness, pains in the chest, abdominal discomfort intermittently.  He thought it might be 1 of his medications, so this week he has been experimenting with holding certain medications.  He has not noticed any improvement.  He has not taken any medicine today other than his insulin.  She presents to the emergency department tonight for this because he checked his blood pressure earlier and he got a reading of 60/40.   Dizziness  Associated symptoms: chest pain   Chest Pain  Associated symptoms: abdominal pain and dizziness   Abdominal Pain  Associated symptoms: chest pain     Past Medical History:  Diagnosis Date  . Diabetes mellitus without complication ([place])   . High cholesterol   . History of prostate cancer   . HTN (hypertension)   . Renal insufficiency     Patient Active Problem List   Diagnosis Date Noted  . Type 2 diabetes mellitus with other circulatory complications ([place]) 93/26/7124  . Hyperglycemia   . TIA (transient ischemic attack) 11/12/2016  . Uncontrolled hypertension 11/12/2016  . DM (diabetes mellitus) ([place]) 11/12/2016  . Renal insufficiency 11/12/2016    History reviewed. No pertinent surgical history.      Home Medications    Prior to Admission medications   Medication Sig Start Date End Date Taking? Authorizing Provider  albuterol (PROVENTIL HFA;VENTOLIN HFA) 108 (90 Base) MCG/ACT inhaler Inhale 2 puffs into the  lungs every 4 (four) hours as needed for wheezing or shortness of breath (cough, shortness of breath or wheezing.). 08/04/18   Robyn Haber, MD  amLODipine (NORVASC) 10 MG tablet TAKE 1 TABLET BY MOUTH  EVERY DAY 06/14/18   Glendale Chard, MD  aspirin EC 81 MG tablet Take by mouth.    [provider]  benzonatate (TESSALON) 100 MG capsule Take 1-2 capsules (100-200 mg total) by mouth 3 (three) times daily as needed for cough. 08/04/18   Robyn Haber, MD  cholecalciferol (VITAMIN D) 1000 units tablet Take 1,000 Units by mouth daily.    [provider]  clopidogrel (PLAVIX) 75 MG tablet Take 1 tablet (75 mg total) by mouth daily. 11/15/16   Reyne Dumas, MD  doxazosin (CARDURA) 4 MG tablet TAKE 1 TABLET BY MOUTH  DAILY 07/13/18   Glendale Chard, MD  fexofenadine (ALLEGRA) 180 MG tablet Take 180 mg by mouth daily.    [provider]  HUMALOG MIX 50/50 KWIKPEN (50-50) 100 UNIT/ML Kwikpen Inject 24 Units into the skin 2 (two) times daily.  09/20/13   [provider]  Insulin Syringe-Needle U-100 (BD INSULIN SYRINGE U/F) 31G X 5/16" 0.5 ML MISC USE TO INJECT INSULIN AS DIRECTED 04/03/17   [provider]  losartan-hydrochlorothiazide (HYZAAR) 100-25 MG tablet Take 1 tablet by mouth daily. 07/29/18   Glendale Chard, MD  metFORMIN (GLUCOPHAGE-XR) 500 MG 24 hr tablet Take 2,000 mg by mouth every evening.    [provider]  prednisoLONE acetate (PRED FORTE) 1 % ophthalmic suspension Use 1 drop both eyes 3 times a day 04/13/18 10/14/18  [provider]    Family History Family History  Problem Relation Age of Onset  . Cancer Mother   . Cancer Father     Social History Social History   Tobacco Use  . Smoking status: Never Smoker  . Smokeless tobacco: Never Used  Substance Use Topics  . Alcohol use: No  . Drug use: No     Allergies   Statins   Review of Systems Review of Systems  Cardiovascular: Positive for chest pain.    Gastrointestinal: Positive for abdominal pain.  Neurological: Positive for dizziness.  All other systems reviewed and are negative.    Physical Exam Updated Vital Signs BP (!) 172/68 (BP Location: Right Arm)   Pulse (!) 50   Temp 97.9 F (36.6 C) (Oral)   Resp 16   SpO2 100%   Physical Exam Vitals signs and nursing note reviewed.  Constitutional:      General: He is not in acute distress.    Appearance: Normal appearance. He is well-developed.  HENT:     Head: Normocephalic and atraumatic.     Right Ear: Hearing normal.     Left Ear: Hearing normal.     Nose: Nose normal.  Eyes:     Conjunctiva/sclera: Conjunctivae normal.     Pupils: Pupils are equal, round, and reactive to light.  Neck:     Musculoskeletal: Normal range of motion and neck supple.  Cardiovascular:     Rate and Rhythm: Regular rhythm.     Heart sounds: S1 normal and S2 normal. No murmur. No friction rub. No gallop.   Pulmonary:     Effort: Pulmonary effort is normal. No respiratory distress.     Breath sounds: Normal breath sounds.  Chest:     Chest wall: No tenderness.  Abdominal:     General: Bowel sounds are normal.     Palpations: Abdomen is soft.     Tenderness: There is no abdominal tenderness. There is no guarding or rebound. Negative signs include Murphy's sign and McBurney's sign.     Hernia: No hernia is present.  Musculoskeletal: Normal range of motion.  Skin:    General: Skin is warm and dry.     Findings: No rash.  Neurological:     Mental Status: He is alert and oriented to person, place, and time.     GCS: GCS eye subscore is 4. GCS verbal subscore is 5. GCS motor subscore is 6.     Cranial Nerves: No cranial nerve deficit.     Sensory: No sensory deficit.     Coordination: Coordination normal.  Psychiatric:        Speech: Speech normal.        Behavior: Behavior normal.        Thought Content: Thought content normal.      ED Treatments / Results  Labs (all labs ordered  are listed, but only abnormal results are displayed) Labs Reviewed  BASIC METABOLIC PANEL - Abnormal; Notable for the following components:      Result Value   CO2 21 (*)    Glucose, Bld 323 (*)    Creatinine, Ser 1.63 (*)    GFR calc non Af Amer 38 (*)    GFR calc Af Amer 44 (*)    All other components within normal limits  CBC - Abnormal; Notable for the following  components:   WBC 3.8 (*)    Hemoglobin 12.7 (*)    RDW 15.7 (*)    All other components within normal limits  I-STAT TROPONIN, ED    EKG EKG Interpretation  Date/Time:  Sunday October 09 2018 21:10:14 EST Ventricular Rate:  54 PR Interval:  196 QRS Duration: 82 QT Interval:  412 QTC Calculation: 390 R Axis:   118 Text Interpretation:  Suspect arm lead reversal, interpretation assumes no reversal Sinus tachycardia with 2nd degree A-V block with 3:1 A-V conduction Left posterior fascicular block Septal infarct , age undetermined Possible Inferior infarct , age undetermined Abnormal ECG Confirmed by Orpah Greek [phone]) on 10/09/2018 11:08:24 PM   Radiology No results found.  Procedures Procedures (including critical care time)  Medications Ordered in ED Medications - No data to display   Initial Impression / Assessment and Plan / ED Course  I have reviewed the triage vital signs and the nursing notes.  Pertinent labs & imaging results that were available during my care of the patient were reviewed by me and considered in my medical decision making (see chart for details).        Patient appears well.  No neurologic deficits on exam.  Remainder of exam is unremarkable.  Patient reports significantly low blood pressures at home, however, I suspect that this was errant readings because his blood pressure has been markedly elevated here in the ER.  This makes sense because he normally takes for blood pressure medications and he has not taken them this week.  Work-up is unremarkable.  Slight  elevation of creatinine which has been seen previously.  Slight anemia which is also been seen previously.  Patient encouraged to start taking his medications again.  Blood sugar is elevated here, likely needs the metformin in addition to his insulin.  I did discuss his blood pressure medication with him.  There was a question of possible low blood pressure earlier, although I do not believe those readings.  He will take his amlodipine and losartan tomorrow, check his blood pressures through the day.  If his blood pressures are not low that he can add back his Cardura the next day.  He will call his primary care doctor tomorrow for an appointment for recheck.  Final Clinical Impressions(s) / ED Diagnoses   Final diagnoses:  Essential hypertension  Hyperglycemia    ED Discharge Orders    None       Orpah Greek, MD 10/09/18 [phone]

## 2018-10-17 ENCOUNTER — Ambulatory Visit: Payer: Medicare Other | Admitting: Cardiology

## 2018-10-17 ENCOUNTER — Encounter: Payer: Self-pay | Admitting: Cardiology

## 2018-10-17 ENCOUNTER — Ambulatory Visit (INDEPENDENT_AMBULATORY_CARE_PROVIDER_SITE_OTHER): Payer: Medicare Other | Admitting: Internal Medicine

## 2018-10-17 ENCOUNTER — Encounter: Payer: Self-pay | Admitting: Internal Medicine

## 2018-10-17 VITALS — BP 112/80 | HR 166 | Temp 97.5°F | Ht 75.0 in | Wt 240.6 lb

## 2018-10-17 VITALS — BP 125/90 | HR 135 | Ht 75.0 in | Wt 240.0 lb

## 2018-10-17 DIAGNOSIS — Z09 Encounter for follow-up examination after completed treatment for conditions other than malignant neoplasm: Secondary | ICD-10-CM | POA: Diagnosis not present

## 2018-10-17 DIAGNOSIS — I4892 Unspecified atrial flutter: Secondary | ICD-10-CM | POA: Diagnosis not present

## 2018-10-17 DIAGNOSIS — Z6830 Body mass index (BMI) 30.0-30.9, adult: Secondary | ICD-10-CM

## 2018-10-17 DIAGNOSIS — R0683 Snoring: Secondary | ICD-10-CM | POA: Diagnosis not present

## 2018-10-17 DIAGNOSIS — E6609 Other obesity due to excess calories: Secondary | ICD-10-CM

## 2018-10-17 DIAGNOSIS — R002 Palpitations: Secondary | ICD-10-CM

## 2018-10-17 DIAGNOSIS — Z8673 Personal history of transient ischemic attack (TIA), and cerebral infarction without residual deficits: Secondary | ICD-10-CM

## 2018-10-17 DIAGNOSIS — R600 Localized edema: Secondary | ICD-10-CM | POA: Diagnosis not present

## 2018-10-17 DIAGNOSIS — I484 Atypical atrial flutter: Secondary | ICD-10-CM

## 2018-10-17 DIAGNOSIS — I10 Essential (primary) hypertension: Secondary | ICD-10-CM

## 2018-10-17 DIAGNOSIS — I471 Supraventricular tachycardia: Secondary | ICD-10-CM

## 2018-10-17 HISTORY — DX: Atypical atrial flutter: I48.4

## 2018-10-17 MED ORDER — METOPROLOL TARTRATE 25 MG PO TABS: 25.0000 mg | ORAL_TABLET | Freq: Two times a day (BID) | ORAL | 2 refills | Status: DC

## 2018-10-17 MED ORDER — AMLODIPINE BESYLATE 5 MG PO TABS: 10.0000 mg | ORAL_TABLET | Freq: Every day | ORAL | 1 refills | Status: DC

## 2018-10-17 MED ORDER — APIXABAN 2.5 MG PO TABS: 2.5000 mg | ORAL_TABLET | Freq: Two times a day (BID) | ORAL | 2 refills | Status: DC

## 2018-10-17 NOTE — Progress Notes (Signed)
Patient referred by Glendale Chard, MD for tachycardia  Subjective:   Marcus Johnston, male    DOB: Jul 31, 1931, 85 y.o.   MRN: [id number]   Chief Complaint  Patient presents with  . Tachycardia  . New Patient (Initial Visit)   HPI  85 year old African-American male with hypertension, hyperlipidemia, type 2 diabetes mellitus, h/o CVA, CKD 3, referred for urgent evaluation of tachycardia by Dr. Baird Cancer.  Patient reports palpitations over the past month or so.  Happened over the past month. Certain episodes are associated with dizziness. He is quite active for age and does all daily chores without any difficulty. He denies chest pain, shortness of breath, palpitations, leg edema, orthopnea, PND, TIA/syncope.  Chart review shows that patient was in emergency department on 10/10/2018 with complaints of chest pain, dizziness.  There was one reported episode of blood pressure 60/40 mmHg while at home, but patient was normotensive while in the ED.  I reviewed the EKG from this ED admission, which shows atypical atrial flutter with 3: 1 AV conduction. He has h/o multifocal CVA in 2018, associated with slurred speech and facial droop. He has no significant residual neurodeficit.    Past Medical History:  Diagnosis Date  . Diabetes mellitus without complication ([place])   . High cholesterol   . History of prostate cancer   . HTN (hypertension)   . Renal insufficiency      Past Surgical History:  Procedure Laterality Date  . prostate implant       Social History   Socioeconomic History  . Marital status: Married    Spouse name: Not on file  . Number of children: Not on file  . Years of education: Not on file  . Highest education level: Not on file  Occupational History  . Not on file  Social Needs  . Financial resource strain: Not on file  . Food insecurity:    Worry: Not on file    Inability: Not on file  . Transportation needs:    Medical: Not on file    Non-medical: Not  on file  Tobacco Use  . Smoking status: Never Smoker  . Smokeless tobacco: Never Used  Substance and Sexual Activity  . Alcohol use: No  . Drug use: No  . Sexual activity: Not on file  Lifestyle  . Physical activity:    Days per week: Not on file    Minutes per session: Not on file  . Stress: Not on file  Relationships  . Social connections:    Talks on phone: Not on file    Gets together: Not on file    Attends religious service: Not on file    Active member of club or organization: Not on file    Attends meetings of clubs or organizations: Not on file    Relationship status: Not on file  . Intimate partner violence:    Fear of current or ex partner: Not on file    Emotionally abused: Not on file    Physically abused: Not on file    Forced sexual activity: Not on file  Other Topics Concern  . Not on file  Social History Narrative  . Not on file     Current Outpatient Medications on File Prior to Visit  Medication Sig Dispense Refill  . albuterol (PROVENTIL HFA;VENTOLIN HFA) 108 (90 Base) MCG/ACT inhaler Inhale 2 puffs into the lungs every 4 (four) hours as needed for wheezing or shortness of breath (cough, shortness of  breath or wheezing.). 1 Inhaler 1  . cholecalciferol (VITAMIN D) 1000 units tablet Take 1,000 Units by mouth daily.    Marland Kitchen doxazosin (CARDURA) 4 MG tablet TAKE 1 TABLET BY MOUTH  DAILY 90 tablet 1  . fexofenadine (ALLEGRA) 180 MG tablet Take 180 mg by mouth daily.    Marland Kitchen HUMALOG MIX 50/50 KWIKPEN (50-50) 100 UNIT/ML Kwikpen Inject 24 Units into the skin 2 (two) times daily.     . Insulin Syringe-Needle U-100 (BD INSULIN SYRINGE U/F) 31G X 5/16" 0.5 ML MISC USE TO INJECT INSULIN AS DIRECTED    . losartan-hydrochlorothiazide (HYZAAR) 100-25 MG tablet Take 1 tablet by mouth daily. 90 tablet 1  . metFORMIN (GLUCOPHAGE-XR) 500 MG 24 hr tablet Take 1,000 mg by mouth 2 (two) times daily.     . prednisoLONE acetate (PRED MILD) 0.12 % ophthalmic suspension Place 1 drop  into both eyes 4 (four) times daily.    Marland Kitchen ketorolac (ACULAR) 0.5 % ophthalmic solution Place 1 drop into both eyes 3 (three) times daily.     No current facility-administered medications on file prior to visit.     Cardiovascular studies:  EKG 10/17/2018: Atypical atrial flutter with RVR 160 bpm. Nonspecific ST-T abnormality  MRI Brain 11/12/2016: 1. No acute intracranial abnormality identified. 2. Moderate chronic microvascular ischemic changes and moderate parenchymal volume loss of the brain. 3. Scattered punctate foci of susceptibility hypointensity compatible with hemosiderin deposition, probably related to chronic hypertension, less likely cerebral amyloid angiopathy. 4. Normal MRA of the head.  Review of Systems  Constitution: Negative for decreased appetite, malaise/fatigue, weight gain and weight loss.  HENT: Negative for congestion.   Eyes: Negative for visual disturbance.  Cardiovascular: Positive for palpitations. Negative for chest pain, dyspnea on exertion, leg swelling and syncope.  Respiratory: Negative for shortness of breath.   Endocrine: Negative for cold intolerance.  Hematologic/Lymphatic: Does not bruise/bleed easily.  Skin: Negative for itching and rash.  Musculoskeletal: Negative for myalgias.  Gastrointestinal: Negative for abdominal pain, nausea and vomiting.  Genitourinary: Negative for dysuria.  Neurological: Positive for dizziness and light-headedness. Negative for weakness.  Psychiatric/Behavioral: The patient is not nervous/anxious.   All other systems reviewed and are negative.        Vitals:   10/17/18 1318  BP: 125/90  Pulse: (!) 135  SpO2: 96%    Objective:   Physical Exam  Constitutional: He is oriented to person, place, and time. He appears well-developed and well-nourished. No distress.  Aageppears younger than stated   HENT:  Head: Normocephalic and atraumatic.  Eyes: Pupils are equal, round, and reactive to light.  Conjunctivae are normal.  Neck: No JVD present.  Cardiovascular: Normal rate and regular rhythm.  No murmur heard. Distal pulses not well appreciated likely due to leg edema  Pulmonary/Chest: Effort normal and breath sounds normal. He has no wheezes. He has no rales.  Abdominal: Soft. Bowel sounds are normal. There is no rebound.  Musculoskeletal:        General: Edema (1-2 +b/) present.  Lymphadenopathy:    He has no cervical adenopathy.  Neurological: He is alert and oriented to person, place, and time. No cranial nerve deficit.  Skin: Skin is warm and dry.  Psychiatric: He has a normal mood and affect.  Nursing note and vitals reviewed.         Assessment & Recommendations:   85 year old African-American male with hypertension, hyperlipidemia, type 2 diabetes mellitus, h/o CVA, CKD 3, referred for urgent evaluation of tachycardia by  Dr. Baird Cancer.  1. Atrial flutter with rapid ventricular response (HCC) CHA2DS2VASc score 5, annual stroke risk 7% Started eliquis 2.5 mg bid. Risks/benefits discussed with the patient.  He is hemodynamically stable and asymptomatic apart from palpitations. Started metoprolol 25 mg bid. Will proceed with TEE + cardioversion at the earliest available next appt. In the meantime, if he has any chest pain/syncope/severe shortness of breath episodes, recommend ED admission. I briefly discussed ablation vs antiarrhythmic therapy with the patient. Will discuss more after TEE/cardioversion.  2. Essential hypertension Controlled.  3. Bilateral leg edema Could be related to amlodipine. Reduce to 5 mg.  Thank you for referring the patient to [place]. Please feel free to contact with any questions.  Nigel Mormon, MD [facility] [place] Pager: [phone] Office: [phone] If no answer Cell [phone]

## 2018-10-17 NOTE — Patient Instructions (Signed)
Atrial Flutter    Atrial flutter is a type of abnormal heart rhythm (arrhythmia). The heart has an electrical system that tells the heart how to beat. In atrial flutter, the signals move rapidly in the top chambers of the heart (the atria). This makes your heart beat very fast. Atrial flutter can come and go, or it can be permanent.  If this condition is not treated it can cause serious complications, such as stroke or weakened heart muscle (cardiomyopathy).  What are the causes?  This condition may be caused by:   A heart condition or problem, such as:  ? A heart attack.  ? Heart failure.  ? A heart valve problem.  ? Heart surgery.   A lung problem, such as:  ? A blood clot in the lungs (pulmonary embolism, or PE).  ? Chronic obstructive pulmonary disease.   Poorly controlled high blood pressure (hypertension).   Overactive thyroid (hyperthyroidism).   Caffeine.   Some decongestant cold medicines.   Low levels of minerals called electrolytes in the blood.   Cocaine.  What increases the risk?  You are more likely to develop this condition if:   You are an elderly adult.   You are a man.   You are obese.   You have obstructive sleep apnea.   You have a family history of atrial flutter.   You have diabetes.  What are the signs or symptoms?  Symptoms of this condition include:   A feeling that your heart is pounding or racing (palpitations).   Shortness of breath.   Chest pain.   Feeling light-headed.   Dizziness.   Fainting.   Low blood pressure (hypotension).   Fatigue.  Sometimes there are no symptoms associated with arrhythmia.  How is this diagnosed?  This condition may be diagnosed based on:   An electrocardiogram (ECG). This is a test that records the electrical signals in the heart.   Ambulatory cardiac monitoring. This is a small recording device that is connected by wires to flat, sticky disks (electrodes) that are attached to your chest.   An echocardiogram. This is a test that uses  sound waves to create pictures of your heart.   A transesophageal echocardiogram (TEE). In this test, a device is placed down your esophagus. This device then uses sounds waves to create even closer pictures of your heart.   Stress test. This test records your heartbeat while you exercise and checks to see if the heart muscle is receiving adequate blood supply.  How is this treated?  This condition may be treated with:   Medicines to:  ? Make your heart beat more slowly.  ? Keep your heart in normal rhythm.  ? Prevent a stroke.   Cardioversion. This uses medicines or an electrical shock to make the heart beat normally.   Ablation. This destroys the heart tissue that is causing the problem.  In some cases, your health care provider will treat other underlying conditions.  Follow these instructions at home:  Medicines   Take over-the-counter and prescription medicines only as told by your health care provider.  ? Make sure you take your medicines exactly as told by your health care provider.  ? Do not miss any doses.   Do not take any new medicines without talking to your health care provider.  Lifestyle   Eat heart-healthy foods. Talk with a dietitian to make an eating plan that is right for you.   Do not use any products that   contain nicotine or tobacco, such as cigarettes and e-cigarettes. If you need help quitting, ask your health care provider.   Limit alcohol intake to no more than 1 drink per day for nonpregnant women and 2 drinks per day for men. One drink equals 12 oz of beer, 5 oz of wine, or 1 oz of hard liquor.   Try to reduce any stress. Stress can make your symptoms worse.   Get screened for sleep apnea. If you have the condition, work with your health care provider to find a treatment that works for you.   Do not use drugs.   Avoid excessive caffeine.  General instructions   Lose weight if your health care provider tells you to do that.   Keep all follow-up visits as told by your health  care provider. This is important.  Contact a health care provider if:   Your symptoms get worse.   You notice that your palpitations are increasing.  Get help right away if:   You have any symptoms of a stroke. "BE FAST" is an easy way to remember the main warning signs of a stroke:  ? B - Balance. Signs are dizziness, sudden trouble walking, or loss of balance.  ? E - Eyes. Signs are trouble seeing or a sudden change in vision.  ? F - Face. Signs are sudden weakness or numbness of the face, or the face or eyelid drooping on one side.  ? A - Arms. Signs are weakness or numbness in an arm. This happens suddenly and usually on one side of the body.  ? S - Speech. Signs are sudden trouble speaking, slurred speech, or trouble understanding what people say.  ? T - Time. Time to call emergency services. Write down what time symptoms started.   You have other signs of a stroke, such as:  ? A sudden, severe headache with no known cause.  ? Nausea or vomiting.  ? Seizure.   You have additional symptoms, such as:  ? Fainting.  ? Shortness of breath.  ? Pain or pressure in your chest.  ? Suddenly feeling nauseous or suddenly vomiting.  ? Increased sweating with no known cause.   These symptoms may represent a serious problem that is an emergency. Do not wait to see if the symptoms will go away. Get medical help right away. Call your local emergency services (911 in the [place] Do not drive yourself to the hospital.  Summary   Atrial flutter is an abnormal heart rhythm that can give you symptoms of palpitations, shortness of breath, or fatigue.   Atrial flutter is often treated with medicines to keep your heart in a normal rhythm and to prevent a stroke.   You should seek immediate help if you cannot catch your breath, have chest pain or pressure, or have weakness, especially on one side of your body.  This information is not intended to replace advice given to you by your health care provider. Make sure you discuss any  questions you have with your health care provider.  Document Released: 12/20/2008 Document Revised: 04/22/2018 Document Reviewed: 05/06/2017  Elsevier Interactive Patient Education  2019 [employer]

## 2018-10-17 NOTE — Patient Instructions (Signed)
Supraventricular Tachycardia, Adult  Supraventricular tachycardia (SVT) is a kind of abnormal heartbeat. It makes your heart beat very fast and then beat at a normal speed.  A normal resting heartbeat is 60-100 times a minute. This condition can make your heart beat more than 150 times a minute. Times of having a fast heartbeat (episodes) can be scary, but they are usually not dangerous. However, in some cases, they can lead to heart failure if:  · They happen several times per day.  · Last for longer than a few seconds.  What are the causes?  Usually, a normal heartbeat starts when an area called the sinoatrial node releases an electrical signal. In SVT, other areas of the heart send out electrical signals that interfere with the signal from the sinoatrial node.  What increases the risk?  · Being 12?83 years old.  · Being a woman.  The following factors may make you more likely to develop this condition:  · Stress.  · Tiredness.  · Smoking.  · Stimulant drugs, such as cocaine and methamphetamine  · Alcohol.  · Caffeine.  · Pregnancy.  · Anxiety.  What are the signs or symptoms?  · A pounding heart.  ? A feeling that your heart is skipping beats (palpitations).  ? Weakness.  ? Trouble getting enough air.  ? Pain or tightness in your chest.  ? Feeling like you are going to pass out.  ? Feeling worried or nervous.  ? Dizziness.  ? Sweating.  ? Feeling like you might throw up.  ? Passing out (fainting).  ? Tiredness.  Sometimes, there are no symptoms.  How is this treated?  · Vagal nerve stimulation. Ways to do this:  ? Holding your breath and pushing, as though you are pooping.  ? Massaging an area on one side of your neck. Do not try this yourself. Only a doctor should do this. If done the wrong way, it can lead to a stroke.  ? Bending forward with your head between your legs.  ? Coughing while bending forward with your head between your legs.  ? Closing your eyes and massaging your eyeballs. Ask a doctor how to do  this.  · Medicines that prevent attacks.  · Medicine to stop an attack given through an IV at the hospital.  · A small electric shock (cardioversion) that stops an attack.  · Radiofrequency ablation. In this procedure, a small, thin tube (catheter) is used to send radiofrequency energy to the area that is causing the rapid heartbeats.  Follow these instructions at home:  Stress    · Avoid things that make you feel stressed.  · To deal with stress, try:  ? Doing yoga, meditation, or being out in nature.  ? Listening to relaxing music.  ? Doing deep breathing.  ? Taking steps to be healthy, such as getting lots of sleep, exercising, and eating a balanced diet.  ? Talking with a mental health doctor.  Sleep  · Try to get at least 7 hours of sleep each night.  Tobacco and nicotine    · Do not use any products that contain nicotine or tobacco, such as cigarettes, e-cigarettes, and chewing tobacco. If you need help quitting, ask your doctor.  Alcohol  · If alcohol gives you a fast heartbeat, do not drink alcohol.  · Even in alcohol does not seem to give you a fast heartbeat, limit alcohol use,.  ? For nonpregnant women, this means no more than 1   drink a day.  ? For men, this means no more than 2 drinks a day.  § "One drink" means one of these:  § 12 oz of beer (355 mL).  § 5 oz of wine (148 mL)  § 1½ oz of hard liquor (44 mL).  Caffeine  · If caffeine gives you a fast heartbeat, do not eat, drink, or use anything with caffeine in it.  · Even if caffeine does not seem to give you a fast heartbeat, limit how much caffeine you eat, drink, or use.  Stimulant drugs  · Do not use drugs such as cocaine or methamphetamine. If you need help quitting, ask your doctor.  General instructions  · Stay at a healthy weight.  · Exercise regularly. Ask your doctor about good activities for you. Try one of these:  ? 150 minutes a week of gentle exercise, like walking or yoga.  ? 75 minutes a week of exercise that is very active, like  running or swimming.  · Try a combination of gentle exercise and very active exercise.  · Do home treatments to slow down your heartbeat.  · Take over-the-counter and prescription medicines only as told by your doctor.  Contact a doctor if:  · You have a fast heartbeat more often.  · Times of having a fast heartbeat last longer than before.  · Home treatments to slow down your heartbeat do not help.  · You have new symptoms.  Get help right away if:  · You have chest pain.  · Your symptoms get worse.  · You have trouble breathing.  · Your heart beats very fast for more than 20 minutes.  · You pass out.  These symptoms may be an emergency. Do not wait to see if the symptoms will go away. Get medical help right away. Call your local emergency services (911 in the [place] Do not drive yourself to the hospital.  Summary  · SVT is a type of abnormal heart beat.  · During an episode, our heart rate may be higher than 150 beats per minute  · Treatment depends on how often the condition happens and your symptoms.  This information is not intended to replace advice given to you by your health care provider. Make sure you discuss any questions you have with your health care provider.  Document Released: 08/03/2005 Document Revised: 04/09/2016 Document Reviewed: 04/09/2016  Elsevier Interactive Patient Education © 2019 [employer]

## 2018-10-18 ENCOUNTER — Encounter: Payer: Self-pay | Admitting: Internal Medicine

## 2018-10-18 LAB — MAGNESIUM: Magnesium: 1.4 mg/dL — ABNORMAL LOW (ref 1.6–2.3)

## 2018-10-18 LAB — TSH: TSH: 2.37 u[IU]/mL (ref 0.450–4.500)

## 2018-10-25 NOTE — H&P (Signed)
Patient referred by Glendale Chard, MD for tachycardia  Subjective:   Marcus Johnston, male    DOB: Dec 15, 1930, 85 y.o.   MRN: [id number]      Chief Complaint  Patient presents with  . Tachycardia  . New Patient (Initial Visit)   HPI  85 year old African-American male with hypertension, hyperlipidemia, type 2 diabetes mellitus, h/o CVA, CKD 3, referred for urgent evaluation of tachycardia by Dr. Baird Cancer.  Patient reports palpitations over the past month or so.  Happened over the past month. Certain episodes are associated with dizziness. He is quite active for age and does all daily chores without any difficulty. He denies chest pain, shortness of breath, palpitations, leg edema, orthopnea, PND, TIA/syncope.  Chart review shows that patient was in emergency department on 10/10/2018 with complaints of chest pain, dizziness.  There was one reported episode of blood pressure 60/40 mmHg while at home, but patient was normotensive while in the ED.  I reviewed the EKG from this ED admission, which shows atypical atrial flutter with 3: 1 AV conduction. He has h/o multifocal CVA in 2018, associated with slurred speech and facial droop. He has no significant residual neurodeficit.        Past Medical History:  Diagnosis Date  . Diabetes mellitus without complication ([facility])   . High cholesterol   . History of prostate cancer   . HTN (hypertension)   . Renal insufficiency           Past Surgical History:  Procedure Laterality Date  . prostate implant       Social History        Socioeconomic History  . Marital status: Married    Spouse name: Not on file  . Number of children: Not on file  . Years of education: Not on file  . Highest education level: Not on file  Occupational History  . Not on file  Social Needs  . Financial resource strain: Not on file  . Food insecurity:    Worry: Not on file    Inability: Not on file  . Transportation  needs:    Medical: Not on file    Non-medical: Not on file  Tobacco Use  . Smoking status: Never Smoker  . Smokeless tobacco: Never Used  Substance and Sexual Activity  . Alcohol use: No  . Drug use: No  . Sexual activity: Not on file  Lifestyle  . Physical activity:    Days per week: Not on file    Minutes per session: Not on file  . Stress: Not on file  Relationships  . Social connections:    Talks on phone: Not on file    Gets together: Not on file    Attends religious service: Not on file    Active member of club or organization: Not on file    Attends meetings of clubs or organizations: Not on file    Relationship status: Not on file  . Intimate partner violence:    Fear of current or ex partner: Not on file    Emotionally abused: Not on file    Physically abused: Not on file    Forced sexual activity: Not on file  Other Topics Concern  . Not on file  Social History Narrative  . Not on file           Current Outpatient Medications on File Prior to Visit  Medication Sig Dispense Refill  . albuterol (PROVENTIL HFA;VENTOLIN HFA) 108 (90 Base)  MCG/ACT inhaler Inhale 2 puffs into the lungs every 4 (four) hours as needed for wheezing or shortness of breath (cough, shortness of breath or wheezing.). 1 Inhaler 1  . cholecalciferol (VITAMIN D) 1000 units tablet Take 1,000 Units by mouth daily.    Marland Kitchen doxazosin (CARDURA) 4 MG tablet TAKE 1 TABLET BY MOUTH  DAILY 90 tablet 1  . fexofenadine (ALLEGRA) 180 MG tablet Take 180 mg by mouth daily.    Marland Kitchen HUMALOG MIX 50/50 KWIKPEN (50-50) 100 UNIT/ML Kwikpen Inject 24 Units into the skin 2 (two) times daily.     . Insulin Syringe-Needle U-100 (BD INSULIN SYRINGE U/F) 31G X 5/16" 0.5 ML MISC USE TO INJECT INSULIN AS DIRECTED    . losartan-hydrochlorothiazide (HYZAAR) 100-25 MG tablet Take 1 tablet by mouth daily. 90 tablet 1  . metFORMIN (GLUCOPHAGE-XR) 500 MG 24 hr tablet Take 1,000 mg by mouth 2  (two) times daily.     . prednisoLONE acetate (PRED MILD) 0.12 % ophthalmic suspension Place 1 drop into both eyes 4 (four) times daily.    Marland Kitchen ketorolac (ACULAR) 0.5 % ophthalmic solution Place 1 drop into both eyes 3 (three) times daily.     No current facility-administered medications on file prior to visit.     Cardiovascular studies:  EKG 10/17/2018: Atypical atrial flutter with RVR 160 bpm. Nonspecific ST-T abnormality  MRI Brain 11/12/2016: 1. No acute intracranial abnormality identified. 2. Moderate chronic microvascular ischemic changes and moderate parenchymal volume loss of the brain. 3. Scattered punctate foci of susceptibility hypointensity compatible with hemosiderin deposition, probably related to chronic hypertension, less likely cerebral amyloid angiopathy. 4. Normal MRA of the head.  Review of Systems  Constitution: Negative for decreased appetite, malaise/fatigue, weight gain and weight loss.  HENT: Negative for congestion.   Eyes: Negative for visual disturbance.  Cardiovascular: Positive for palpitations. Negative for chest pain, dyspnea on exertion, leg swelling and syncope.  Respiratory: Negative for shortness of breath.   Endocrine: Negative for cold intolerance.  Hematologic/Lymphatic: Does not bruise/bleed easily.  Skin: Negative for itching and rash.  Musculoskeletal: Negative for myalgias.  Gastrointestinal: Negative for abdominal pain, nausea and vomiting.  Genitourinary: Negative for dysuria.  Neurological: Positive for dizziness and light-headedness. Negative for weakness.  Psychiatric/Behavioral: The patient is not nervous/anxious.   All other systems reviewed and are negative.           Vitals:   10/17/18 1318  BP: 125/90  Pulse: (!) 135  SpO2: 96%    Objective:   Objective   Physical Exam  Constitutional: He is oriented to person, place, and time. He appears well-developed and well-nourished. No distress.    Aageppears younger than stated   HENT:  Head: Normocephalic and atraumatic.  Eyes: Pupils are equal, round, and reactive to light. Conjunctivae are normal.  Neck: No JVD present.  Cardiovascular: Normal rate and regular rhythm.  No murmur heard. Distal pulses not well appreciated likely due to leg edema  Pulmonary/Chest: Effort normal and breath sounds normal. He has no wheezes. He has no rales.  Abdominal: Soft. Bowel sounds are normal. There is no rebound.  Musculoskeletal:        General: Edema (1-2 +b/) present.  Lymphadenopathy:    He has no cervical adenopathy.  Neurological: He is alert and oriented to person, place, and time. No cranial nerve deficit.  Skin: Skin is warm and dry.  Psychiatric: He has a normal mood and affect.  Nursing note and vitals reviewed.  Assessment & Recommendations:   85 year old African-American male with hypertension, hyperlipidemia, type 2 diabetes mellitus, h/o CVA, CKD 3, referred for urgent evaluation of tachycardia by Dr. Baird Cancer.  1. Atrial flutter with rapid ventricular response (HCC) CHA2DS2VASc score 5, annual stroke risk 7% Started eliquis 2.5 mg bid. Risks/benefits discussed with the patient.  He is hemodynamically stable and asymptomatic apart from palpitations. Started metoprolol 25 mg bid. Will proceed with TEE + cardioversion at the earliest available next appt. In the meantime, if he has any chest pain/syncope/severe shortness of breath episodes, recommend ED admission. I briefly discussed ablation vs antiarrhythmic therapy with the patient. Will discuss more after TEE/cardioversion.  2. Essential hypertension Controlled.  3. Bilateral leg edema Could be related to amlodipine. Reduce to 5 mg.  Thank you for referring the patient to [place]. Please feel free to contact with any questions.  Nigel Mormon, MD [facility] [place] Pager: [phone] Office: [phone] If no answer Cell  [phone]         Electronically signed by Nigel Mormon, MD at 10/17/2018 9:09 PM

## 2018-10-27 ENCOUNTER — Encounter (HOSPITAL_COMMUNITY): Payer: Self-pay | Admitting: Anesthesiology

## 2018-10-27 NOTE — Anesthesia Preprocedure Evaluation (Deleted)
Anesthesia Evaluation  Patient identified by MRN, date of birth, ID band Patient awake    Reviewed: Allergy & Precautions, NPO status , Patient's Chart, lab work & pertinent test results  Airway        Dental   Pulmonary neg pulmonary ROS,    breath sounds clear to auscultation       Cardiovascular hypertension, Pt. on medications + dysrhythmias Atrial Fibrillation   ECHO 11/14/2018 Left ventricle: The cavity size was normal. There was mild   concentric hypertrophy. Systolic function was normal. The   estimated ejection fraction was in the range of 60% to 65%. Wall   motion was normal; there were no regional wall motion   abnormalities. Doppler parameters are consistent with abnormal   left ventricular relaxation (grade 1 diastolic dysfunction).   There was no evidence of elevated ventricular filling pressure by   Doppler parameters.   Neuro/Psych TIAnegative psych ROS   GI/Hepatic Neg liver ROS,   Endo/Other  diabetes, Type 1  Renal/GU Cr 1.63     Musculoskeletal   Abdominal   Peds  Hematology Hgb 12.7   Anesthesia Other Findings   Reproductive/Obstetrics                            Anesthesia Physical Anesthesia Plan  ASA: III  Anesthesia Plan: MAC   Post-op Pain Management:    Induction: Intravenous  PONV Risk Score and Plan: Treatment may vary due to age or medical condition  Airway Management Planned: Nasal Cannula and Natural Airway  Additional Equipment:   Intra-op Plan:   Post-operative Plan:   Informed Consent: I have reviewed the patients History and Physical, chart, labs and discussed the procedure including the risks, benefits and alternatives for the proposed anesthesia with the patient or authorized representative who has indicated his/her understanding and acceptance.       Plan Discussed with: CRNA  Anesthesia Plan Comments:         Anesthesia Quick  Evaluation

## 2018-10-28 ENCOUNTER — Encounter (HOSPITAL_COMMUNITY): Payer: Self-pay | Admitting: *Deleted

## 2018-10-28 ENCOUNTER — Ambulatory Visit (HOSPITAL_COMMUNITY): Admit: 2018-10-28 | Payer: Medicare Other

## 2018-10-28 ENCOUNTER — Ambulatory Visit (HOSPITAL_COMMUNITY)
Admission: RE | Admit: 2018-10-28 | Discharge: 2018-10-28 | Disposition: A | Discharge status: Home / Self Care | Payer: Medicare Other | Attending: Cardiology | Admitting: Cardiology

## 2018-10-28 ENCOUNTER — Other Ambulatory Visit: Payer: Self-pay

## 2018-10-28 ENCOUNTER — Encounter (HOSPITAL_COMMUNITY)
Admission: RE | Disposition: A | Discharge status: Home / Self Care | Payer: Self-pay | Source: Home / Self Care | Attending: Cardiology

## 2018-10-28 DIAGNOSIS — Z7984 Long term (current) use of oral hypoglycemic drugs: Secondary | ICD-10-CM | POA: Diagnosis not present

## 2018-10-28 DIAGNOSIS — I484 Atypical atrial flutter: Secondary | ICD-10-CM | POA: Diagnosis present

## 2018-10-28 DIAGNOSIS — Z79899 Other long term (current) drug therapy: Secondary | ICD-10-CM | POA: Diagnosis not present

## 2018-10-28 DIAGNOSIS — N183 Chronic kidney disease, stage 3 (moderate): Secondary | ICD-10-CM | POA: Insufficient documentation

## 2018-10-28 DIAGNOSIS — R600 Localized edema: Secondary | ICD-10-CM | POA: Diagnosis not present

## 2018-10-28 DIAGNOSIS — E785 Hyperlipidemia, unspecified: Secondary | ICD-10-CM | POA: Insufficient documentation

## 2018-10-28 DIAGNOSIS — I129 Hypertensive chronic kidney disease with stage 1 through stage 4 chronic kidney disease, or unspecified chronic kidney disease: Secondary | ICD-10-CM | POA: Insufficient documentation

## 2018-10-28 DIAGNOSIS — E1122 Type 2 diabetes mellitus with diabetic chronic kidney disease: Secondary | ICD-10-CM | POA: Diagnosis not present

## 2018-10-28 DIAGNOSIS — Z538 Procedure and treatment not carried out for other reasons: Secondary | ICD-10-CM | POA: Diagnosis not present

## 2018-10-28 DIAGNOSIS — Z794 Long term (current) use of insulin: Secondary | ICD-10-CM | POA: Diagnosis not present

## 2018-10-28 DIAGNOSIS — R000 Tachycardia, unspecified: Secondary | ICD-10-CM | POA: Insufficient documentation

## 2018-10-28 SURGERY — CANCELLED PROCEDURE
Anesthesia: Monitor Anesthesia Care

## 2018-10-28 MED ORDER — SODIUM CHLORIDE 0.9 % IV SOLN: INTRAVENOUS | Status: DC

## 2018-10-28 NOTE — Progress Notes (Signed)
Patient in sinus rhythm. TEE/cardioversion canceled. Will follow up outpatient.

## 2018-11-05 ENCOUNTER — Other Ambulatory Visit: Payer: Self-pay | Admitting: Internal Medicine

## 2018-11-06 ENCOUNTER — Encounter: Payer: Self-pay | Admitting: Internal Medicine

## 2018-11-06 NOTE — Progress Notes (Signed)
Subjective:     Patient ID: Marcus Johnston , male    DOB: Jan 02, 1931 , 85 y.o.   MRN: [id number]   Chief Complaint  Patient presents with  . Emergancy room f/u    HPI  He is here today for ER f/u. He was seen at [facility] on 2/23 for palpitations. He states he was awakened by the palpitations. There was no associated chest pain. He had cardiac workup which was negative and he was sent home. He is here today for f/u. He has had recurrence of his sx since ER discharge.     Past Medical History:  Diagnosis Date  . Diabetes mellitus without complication ([place])   . High cholesterol   . History of prostate cancer   . HTN (hypertension)   . Renal insufficiency      Family History  Problem Relation Age of Onset  . Cancer Mother   . Cancer Father      Current Outpatient Medications:  .  albuterol (PROVENTIL HFA;VENTOLIN HFA) 108 (90 Base) MCG/ACT inhaler, Inhale 2 puffs into the lungs every 4 (four) hours as needed for wheezing or shortness of breath (cough, shortness of breath or wheezing.)., Disp: 1 Inhaler, Rfl: 1 .  doxazosin (CARDURA) 4 MG tablet, TAKE 1 TABLET BY MOUTH  DAILY (Patient taking differently: Take 4 mg by mouth at bedtime. ), Disp: 90 tablet, Rfl: 1 .  fexofenadine (ALLEGRA) 180 MG tablet, Take 180 mg by mouth daily as needed (allergies.). , Disp: , Rfl:  .  losartan-hydrochlorothiazide (HYZAAR) 100-25 MG tablet, Take 1 tablet by mouth daily. (Patient not taking: Reported on 10/24/2018), Disp: 90 tablet, Rfl: 1 .  amLODipine (NORVASC) 5 MG tablet, Take 2 tablets (10 mg total) by mouth daily. (Patient taking differently: Take 5 mg by mouth 2 (two) times daily. ), Disp: 30 tablet, Rfl: 1 .  apixaban (ELIQUIS) 2.5 MG TABS tablet, Take 1 tablet (2.5 mg total) by mouth 2 (two) times daily., Disp: 60 tablet, Rfl: 2 .  Cholecalciferol (VITAMIN D3) 50 MCG (2000 UT) TABS, Take 2,000 Units by mouth daily., Disp: , Rfl:  .  clopidogrel (PLAVIX) 75 MG tablet, Take 37.5 mg by mouth every  other day., Disp: , Rfl:  .  HUMALOG MIX 50/50 (50-50) 100 UNIT/ML SUSP injection, Inject 24 Units into the skin 2 (two) times daily., Disp: , Rfl:  .  hydrochlorothiazide (HYDRODIURIL) 25 MG tablet, Take 25 mg by mouth daily., Disp: , Rfl:  .  Insulin Syringe-Needle U-100 (BD INSULIN SYRINGE U/F) 31G X 5/16" 0.5 ML MISC, USE TO INJECT INSULIN AS DIRECTED, Disp: , Rfl:  .  ketorolac (ACULAR) 0.5 % ophthalmic solution, Place 1 drop into both eyes 3 (three) times daily., Disp: , Rfl:  .  losartan (COZAAR) 100 MG tablet, Take 100 mg by mouth daily., Disp: , Rfl:  .  metFORMIN (GLUCOPHAGE-XR) 500 MG 24 hr tablet, Take 1,000 mg by mouth 2 (two) times daily. , Disp: , Rfl:  .  metoprolol tartrate (LOPRESSOR) 25 MG tablet, Take 1 tablet (25 mg total) by mouth 2 (two) times daily., Disp: 60 tablet, Rfl: 2 .  prednisoLONE acetate (PRED FORTE) 1 % ophthalmic suspension, Place 1 drop into both eyes 4 (four) times daily., Disp: , Rfl:    Allergies  Allergen Reactions  . Statins Other (See Comments)    pain     Review of Systems  Constitutional: Negative.   Respiratory: Negative.   Cardiovascular: Positive for palpitations.  Gastrointestinal: Negative.  Neurological: Negative.   Psychiatric/Behavioral: Negative.      Today's Vitals   10/17/18 1130  BP: 112/80  Pulse: (!) 166  Temp: (!) 97.5 F (36.4 C)  TempSrc: Oral  Weight: 240 lb 9.6 oz (109.1 kg)  Height: 6\' 3"  (1.905 m)  PainSc: 6   PainLoc: Head   Body mass index is 30.07 kg/m.   Objective:  Physical Exam Vitals signs and nursing note reviewed.  Constitutional:      Appearance: Normal appearance.  Cardiovascular:     Rate and Rhythm: Regular rhythm. Tachycardia present.     Heart sounds: Normal heart sounds.  Pulmonary:     Effort: Pulmonary effort is normal.     Breath sounds: Normal breath sounds.  Skin:    General: Skin is warm.  Neurological:     General: No focal deficit present.     Mental Status: He is alert.   Psychiatric:        Mood and Affect: Mood normal.         Assessment And Plan:     1. Palpitations  ER records from 2/23 were reviewed in full detail. He is encouraged to start taking magnesium 400mg  nightly.   - TSH - Magnesium - EKG 12-Lead  2. SVT (supraventricular tachycardia) (HCC)  The link between arrhythmias and uncontrolled OSA was discussed with the patient in full detail.  EKG does seem to have irregular rhythm - pt advised this could be afib. He is scheduled to see cardiologist at [facility] later today.    - Ambulatory referral to Neurology  3. Snoring  I will refer him for sleep study.   - Ambulatory referral to Neurology  4. Class 1 obesity due to excess calories with serious comorbidity and body mass index (BMI) of 30.0 to 30.9 in adult  Importance of achieving optimal weight to decrease risk of cardiovascular disease and cancers was discussed with the patient in full detail. He is encouraged to start slowly - start with 10 minutes twice daily at least three to four days per week and to gradually build to 30 minutes five days weekly. He was given tips to incorporate more activity into her daily routine - take stairs when possible, park farther away from  grocery stores, etc.      [name], MD

## 2018-11-06 NOTE — Progress Notes (Signed)
Patient referred by Glendale Chard, MD for tachycardia  Subjective:   Marcus Johnston, male    DOB: November 01, 1930, 85 y.o.   MRN: [id number]   Chief Complaint  Patient presents with  . Atrial Flutter  . Hypertension  . Follow-up   HPI  85 year old African-American male with hypertension, hyperlipidemia, type 2 diabetes mellitus, h/o CVA, CKD 3, seen by me for atypical atrial flutter.   At his initial visit with me, I started him on Eliquis 2.5 mg twice daily, metoprolol 25 mg twice daily, and scheduled for TEE cardioversion.  The patient arrived for TEE cardioversion in the hospital, he was found to be in sinus rhythm.  Thus, he did not undergo the same.  Patient is here for follow-up today.  He has not had any recurrence of his palpitation symptoms.  Blood pressure is elevated, as it was at his initial visit.  Leg edema has improved.   Past Medical History:  Diagnosis Date  . Diabetes mellitus without complication ([place])   . High cholesterol   . History of prostate cancer   . HTN (hypertension)   . Renal insufficiency      Past Surgical History:  Procedure Laterality Date  . prostate implant       Social History   Socioeconomic History  . Marital status: Married    Spouse name: Not on file  . Number of children: Not on file  . Years of education: Not on file  . Highest education level: Not on file  Occupational History  . Not on file  Social Needs  . Financial resource strain: Not on file  . Food insecurity:    Worry: Not on file    Inability: Not on file  . Transportation needs:    Medical: Not on file    Non-medical: Not on file  Tobacco Use  . Smoking status: Never Smoker  . Smokeless tobacco: Never Used  Substance and Sexual Activity  . Alcohol use: No  . Drug use: No  . Sexual activity: Not on file  Lifestyle  . Physical activity:    Days per week: Not on file    Minutes per session: Not on file  . Stress: Not on file  Relationships  . Social  connections:    Talks on phone: Not on file    Gets together: Not on file    Attends religious service: Not on file    Active member of club or organization: Not on file    Attends meetings of clubs or organizations: Not on file    Relationship status: Not on file  . Intimate partner violence:    Fear of current or ex partner: Not on file    Emotionally abused: Not on file    Physically abused: Not on file    Forced sexual activity: Not on file  Other Topics Concern  . Not on file  Social History Narrative  . Not on file     Current Outpatient Medications on File Prior to Visit  Medication Sig Dispense Refill  . albuterol (PROVENTIL HFA;VENTOLIN HFA) 108 (90 Base) MCG/ACT inhaler Inhale 2 puffs into the lungs every 4 (four) hours as needed for wheezing or shortness of breath (cough, shortness of breath or wheezing.). 1 Inhaler 1  . amLODipine (NORVASC) 5 MG tablet Take 2 tablets (10 mg total) by mouth daily. (Patient taking differently: Take 5 mg by mouth 2 (two) times daily. ) 30 tablet 1  . apixaban (ELIQUIS)  2.5 MG TABS tablet Take 1 tablet (2.5 mg total) by mouth 2 (two) times daily. 60 tablet 2  . Cholecalciferol (VITAMIN D3) 50 MCG (2000 UT) TABS Take 2,000 Units by mouth daily.    . clopidogrel (PLAVIX) 75 MG tablet Take 37.5 mg by mouth every other day.    . doxazosin (CARDURA) 4 MG tablet TAKE 1 TABLET BY MOUTH  DAILY (Patient taking differently: Take 4 mg by mouth at bedtime. ) 90 tablet 1  . fexofenadine (ALLEGRA) 180 MG tablet Take 180 mg by mouth daily as needed (allergies.).     Marland Kitchen HUMALOG MIX 50/50 (50-50) 100 UNIT/ML SUSP injection Inject 24 Units into the skin 2 (two) times daily.    . hydrochlorothiazide (HYDRODIURIL) 25 MG tablet Take 25 mg by mouth daily.    . Insulin Syringe-Needle U-100 (BD INSULIN SYRINGE U/F) 31G X 5/16" 0.5 ML MISC USE TO INJECT INSULIN AS DIRECTED    . ketorolac (ACULAR) 0.5 % ophthalmic solution Place 1 drop into both eyes 3 (three) times  daily.    Marland Kitchen losartan (COZAAR) 100 MG tablet Take 100 mg by mouth daily.    Marland Kitchen losartan-hydrochlorothiazide (HYZAAR) 100-25 MG tablet Take 1 tablet by mouth daily. (Patient not taking: Reported on 10/24/2018) 90 tablet 1  . metFORMIN (GLUCOPHAGE-XR) 500 MG 24 hr tablet Take 1,000 mg by mouth 2 (two) times daily.     . metoprolol tartrate (LOPRESSOR) 25 MG tablet Take 1 tablet (25 mg total) by mouth 2 (two) times daily. 60 tablet 2  . prednisoLONE acetate (PRED FORTE) 1 % ophthalmic suspension Place 1 drop into both eyes 4 (four) times daily.     No current facility-administered medications on file prior to visit.     Cardiovascular studies:  EKG 11/07/2018: Sinus rhythm 59 bpm RIght atrial enlargement Left axis deviation.  Baseline artifact.   EKG 10/17/2018: Atypical atrial flutter with RVR 160 bpm. Nonspecific ST-T abnormality  Echocardiogram 11/13/2016: LVEF 60-65%. Grade 1 DD. No regional wall motion No significant valvular abnormality  Vascular US Carotid Bilateral 11/14/2016: intimal wall thickness CCA. 1-39% ICA plaquing. Vertebral artery flow is antegrade.  MRI Brain 11/12/2016: 1. No acute intracranial abnormality identified. 2. Moderate chronic microvascular ischemic changes and moderate parenchymal volume loss of the brain. 3. Scattered punctate foci of susceptibility hypointensity compatible with hemosiderin deposition, probably related to chronic hypertension, less likely cerebral amyloid angiopathy. 4. Normal MRA of the head.  Treadmill stress test 08/26/2015: Comments: Indication: Tachycardia The patient exercised according to Bruce Protocol, Total time recorded 6:01 min achieving max heart rate of 164 which was 95 % of MPHR for age and 7.05 METS of work. Normal BP response. Resting ECG showing tachycardia. @ 105/min. There was no ST-T changes of ischemia with exercise stress test. No arrythmias noted during exercise. Stress terminated due to shortness of breath and  THR >85% met.  Review of Systems  Constitution: Negative for decreased appetite, malaise/fatigue, weight gain and weight loss.  HENT: Negative for congestion.   Eyes: Negative for visual disturbance.  Cardiovascular: Positive for palpitations. Negative for chest pain, dyspnea on exertion, leg swelling and syncope.  Respiratory: Negative for shortness of breath.   Endocrine: Negative for cold intolerance.  Hematologic/Lymphatic: Does not bruise/bleed easily.  Skin: Negative for itching and rash.  Musculoskeletal: Negative for myalgias.  Gastrointestinal: Negative for abdominal pain, nausea and vomiting.  Genitourinary: Negative for dysuria.  Neurological: Positive for dizziness and light-headedness. Negative for weakness.  Psychiatric/Behavioral: The patient is not nervous/anxious.  All other systems reviewed and are negative.        Vitals:   11/07/18 1216  BP: (!) 165/79  Pulse: (!) 54  SpO2: 98%    Objective:   Physical Exam  Constitutional: He is oriented to person, place, and time. He appears well-developed and well-nourished. No distress.  Aageppears younger than stated   HENT:  Head: Normocephalic and atraumatic.  Eyes: Pupils are equal, round, and reactive to light. Conjunctivae are normal.  Neck: No JVD present.  Cardiovascular: Normal rate and regular rhythm.  No murmur heard. Distal pulses not well appreciated likely due to leg edema  Pulmonary/Chest: Effort normal and breath sounds normal. He has no wheezes. He has no rales.  Abdominal: Soft. Bowel sounds are normal. There is no rebound.  Musculoskeletal:        General: Edema (1-2 +b/) present.  Lymphadenopathy:    He has no cervical adenopathy.  Neurological: He is alert and oriented to person, place, and time. No cranial nerve deficit.  Skin: Skin is warm and dry.  Psychiatric: He has a normal mood and affect.  Nursing note and vitals reviewed.         Assessment & Recommendations:    85 year old African-American male with hypertension, hyperlipidemia, type 2 diabetes mellitus, h/o CVA, CKD 3, referred for urgent evaluation of tachycardia by Dr. Baird Cancer.  1. Paroxysmal atrial flutter ([place]) Self converted to sinus rhythm.  Resting heart rate in 50s on metoprolol 25 mg twice daily.  Given patient's TIA with no other etiology found in 2017, I would recommend continuing Eliquis indefinitely.  Stop Plavix. CHA2DS2VASc score 5, annual stroke risk 7%.  At this time, recommend diltiazem 30 mg every 8 hour as needed for episodes of palpitations.  In future, then elective procedures and testing could be performed, recommend echocardiogram.  If recurrent symptoms, could consider antiarrhythmic therapy or ablation.  2. Essential hypertension Suboptimal control.  Unable to increase metoprolol dose given resting heart rate in 50s.  Blood pressure is essentially unchanged compared to last visit.  While spironolactone could be a consideration, do not recommend starting this at current time when elective testing including basic metabolic panel could have limitations in the setting of COVID-19 outbreak. Continue follow up with PCP.  3. Leg edema Improved.   I will see him back in 3 months.   Nigel Mormon, MD [facility] [place] Pager: [phone] Office: [phone] If no answer Cell [phone]

## 2018-11-07 ENCOUNTER — Other Ambulatory Visit: Payer: Self-pay

## 2018-11-07 ENCOUNTER — Ambulatory Visit: Payer: Medicare Other | Admitting: Cardiology

## 2018-11-07 ENCOUNTER — Encounter: Payer: Self-pay | Admitting: Cardiology

## 2018-11-07 VITALS — BP 165/79 | HR 54 | Ht 75.0 in | Wt 240.0 lb

## 2018-11-07 DIAGNOSIS — I10 Essential (primary) hypertension: Secondary | ICD-10-CM | POA: Diagnosis not present

## 2018-11-07 DIAGNOSIS — I4892 Unspecified atrial flutter: Secondary | ICD-10-CM

## 2018-11-07 DIAGNOSIS — R600 Localized edema: Secondary | ICD-10-CM

## 2018-11-07 MED ORDER — DILTIAZEM HCL 30 MG PO TABS: 30.0000 mg | ORAL_TABLET | Freq: Three times a day (TID) | ORAL | 2 refills | Status: DC | PRN

## 2018-11-12 ENCOUNTER — Other Ambulatory Visit: Payer: Self-pay | Admitting: Internal Medicine

## 2018-11-14 ENCOUNTER — Other Ambulatory Visit: Payer: Self-pay

## 2018-11-14 DIAGNOSIS — I4892 Unspecified atrial flutter: Secondary | ICD-10-CM

## 2018-11-14 DIAGNOSIS — I10 Essential (primary) hypertension: Secondary | ICD-10-CM

## 2018-11-14 MED ORDER — APIXABAN 2.5 MG PO TABS: 2.5000 mg | ORAL_TABLET | Freq: Two times a day (BID) | ORAL | 2 refills | Status: DC

## 2018-11-14 MED ORDER — AMLODIPINE BESYLATE 5 MG PO TABS: 5.0000 mg | ORAL_TABLET | Freq: Two times a day (BID) | ORAL | 1 refills | Status: DC

## 2018-11-16 ENCOUNTER — Ambulatory Visit: Payer: Medicare Other | Admitting: Podiatry

## 2018-11-16 ENCOUNTER — Other Ambulatory Visit: Payer: Self-pay

## 2018-11-16 VITALS — Temp 96.2°F

## 2018-11-16 DIAGNOSIS — M79676 Pain in unspecified toe(s): Secondary | ICD-10-CM

## 2018-11-16 DIAGNOSIS — E119 Type 2 diabetes mellitus without complications: Secondary | ICD-10-CM | POA: Diagnosis not present

## 2018-11-16 DIAGNOSIS — B351 Tinea unguium: Secondary | ICD-10-CM | POA: Diagnosis not present

## 2018-11-16 DIAGNOSIS — Z794 Long term (current) use of insulin: Secondary | ICD-10-CM

## 2018-11-16 NOTE — Progress Notes (Signed)
Subjective: Marcus Johnston presents today with painful, thick toenails 1-5 b/l that he cannot cut and which interfere with daily activities.  Pain is aggravated when wearing enclosed shoe gear.  He voices no new problems on today's visit.  Glendale Chard, MD is his PCP and last visit was 10/17/2018.   Current Outpatient Medications:  .  albuterol (PROVENTIL HFA;VENTOLIN HFA) 108 (90 Base) MCG/ACT inhaler, Inhale 2 puffs into the lungs every 4 (four) hours as needed for wheezing or shortness of breath (cough, shortness of breath or wheezing.)., Disp: 1 Inhaler, Rfl: 1 .  amLODipine (NORVASC) 5 MG tablet, Take 1 tablet (5 mg total) by mouth 2 (two) times daily., Disp: 180 tablet, Rfl: 1 .  apixaban (ELIQUIS) 2.5 MG TABS tablet, Take 1 tablet (2.5 mg total) by mouth 2 (two) times daily., Disp: 180 tablet, Rfl: 2 .  Cholecalciferol (VITAMIN D3) 50 MCG (2000 UT) TABS, Take 2,000 Units by mouth daily., Disp: , Rfl:  .  diltiazem (CARDIZEM) 30 MG tablet, Take 1 tablet (30 mg total) by mouth every 8 (eight) hours as needed. Palpitations, Disp: 60 tablet, Rfl: 2 .  doxazosin (CARDURA) 4 MG tablet, Take 1 tablet (4 mg total) by mouth at bedtime., Disp: 90 tablet, Rfl: 1 .  EASY TOUCH INSULIN SYRINGE 30G X 5/16" 0.5 ML MISC, USE 3 TIMES DAILY, Disp: 30 each, Rfl: 4 .  fexofenadine (ALLEGRA) 180 MG tablet, Take 180 mg by mouth daily as needed (allergies.). , Disp: , Rfl:  .  HUMALOG MIX 50/50 (50-50) 100 UNIT/ML SUSP injection, Inject 24 Units into the skin 2 (two) times daily., Disp: , Rfl:  .  hydrochlorothiazide (HYDRODIURIL) 25 MG tablet, Take 25 mg by mouth daily., Disp: , Rfl:  .  Insulin Syringe-Needle U-100 (BD INSULIN SYRINGE U/F) 31G X 5/16" 0.5 ML MISC, USE TO INJECT INSULIN AS DIRECTED, Disp: , Rfl:  .  ketorolac (ACULAR) 0.5 % ophthalmic solution, Place 1 drop into both eyes 3 (three) times daily., Disp: , Rfl:  .  losartan (COZAAR) 100 MG tablet, Take 100 mg by mouth daily., Disp: , Rfl:  .   losartan-hydrochlorothiazide (HYZAAR) 100-25 MG tablet, Take 1 tablet by mouth daily., Disp: 90 tablet, Rfl: 1 .  metFORMIN (GLUCOPHAGE-XR) 500 MG 24 hr tablet, Take 500 mg by mouth 2 (two) times daily. , Disp: , Rfl:  .  metoprolol tartrate (LOPRESSOR) 25 MG tablet, Take 1 tablet (25 mg total) by mouth 2 (two) times daily., Disp: 60 tablet, Rfl: 2 .  prednisoLONE acetate (PRED FORTE) 1 % ophthalmic suspension, Place 1 drop into both eyes 4 (four) times daily., Disp: , Rfl:   Allergies  Allergen Reactions  . Statins Other (See Comments)    pain    Objective:  Vascular Examination: Capillary refill time immediate x 10 digits  Dorsalis pedis and Posterior tibial pulses palpable b/l  Digital hair absent x 10 digits  Skin temperature gradient WNL b/l  Dermatological Examination: Skin with normal turgor, texture and tone b/l  Toenails 1-5 b/l discolored, thick, dystrophic with subungual debris and pain with palpation to nailbeds due to thickness of nails.  Musculoskeletal: Muscle strength 5/5 to all LE muscle groups  No pain, crepitus or joint limitation noted with ROM.   Neurological: Sensation intact 5/5 b/lwith 10 gram monofilament.  Assessment: Painful onychomycosis toenails 1-5 b/l   Plan: 1. Toenails 1-5 b/l were debrided in length and girth without iatrogenic bleeding. 2. Patient to continue soft, supportive shoe gear daily. 3.  Patient to report any pedal injuries to medical professional immediately. 4. Follow up 3 months.  5. Patient/POA to call should there be a concern in the interim.

## 2018-11-20 ENCOUNTER — Encounter: Payer: Self-pay | Admitting: Podiatry

## 2018-11-23 ENCOUNTER — Telehealth: Payer: Self-pay | Admitting: Neurology

## 2018-11-23 NOTE — Telephone Encounter (Signed)
Pt returned nurses call. I informed him of the new protocol in place for our office visits due to COVIC-19. Pt stated he is unable to do a virtual visit due to not having computer with camera or smart phone. I r/s pt's appt till June 10th

## 2018-11-23 NOTE — Telephone Encounter (Signed)
Called the patient to inform them that our office has placed new protocols in place for our office visits. Due to Covid 19 our office is reducing our number of office visits in order to minimize the risk to our patients and healthcare providers.Our office is now providing the capability to offer the patients virtual visits at this time.   There was no answer, LVM informing the patient to call back to discuss further.

## 2018-11-24 ENCOUNTER — Ambulatory Visit (INDEPENDENT_AMBULATORY_CARE_PROVIDER_SITE_OTHER): Payer: Medicare Other | Admitting: Internal Medicine

## 2018-11-24 ENCOUNTER — Encounter: Payer: Self-pay | Admitting: Internal Medicine

## 2018-11-24 ENCOUNTER — Other Ambulatory Visit: Payer: Self-pay

## 2018-11-24 VITALS — BP 120/66 | HR 76 | Temp 97.5°F | Ht 75.0 in | Wt 240.0 lb

## 2018-11-24 DIAGNOSIS — I119 Hypertensive heart disease without heart failure: Secondary | ICD-10-CM

## 2018-11-24 DIAGNOSIS — Z7901 Long term (current) use of anticoagulants: Secondary | ICD-10-CM | POA: Diagnosis not present

## 2018-11-24 DIAGNOSIS — I484 Atypical atrial flutter: Secondary | ICD-10-CM

## 2018-11-24 NOTE — Patient Instructions (Signed)
Atrial Flutter    Atrial flutter is a type of abnormal heart rhythm (arrhythmia). The heart has an electrical system that tells the heart how to beat. In atrial flutter, the signals move rapidly in the top chambers of the heart (the atria). This makes your heart beat very fast. Atrial flutter can come and go, or it can be permanent.  If this condition is not treated it can cause serious complications, such as stroke or weakened heart muscle (cardiomyopathy).  What are the causes?  This condition may be caused by:   A heart condition or problem, such as:  ? A heart attack.  ? Heart failure.  ? A heart valve problem.  ? Heart surgery.   A lung problem, such as:  ? A blood clot in the lungs (pulmonary embolism, or PE).  ? Chronic obstructive pulmonary disease.   Poorly controlled high blood pressure (hypertension).   Overactive thyroid (hyperthyroidism).   Caffeine.   Some decongestant cold medicines.   Low levels of minerals called electrolytes in the blood.   Cocaine.  What increases the risk?  You are more likely to develop this condition if:   You are an elderly adult.   You are a man.   You are obese.   You have obstructive sleep apnea.   You have a family history of atrial flutter.   You have diabetes.  What are the signs or symptoms?  Symptoms of this condition include:   A feeling that your heart is pounding or racing (palpitations).   Shortness of breath.   Chest pain.   Feeling light-headed.   Dizziness.   Fainting.   Low blood pressure (hypotension).   Fatigue.  Sometimes there are no symptoms associated with arrhythmia.  How is this diagnosed?  This condition may be diagnosed based on:   An electrocardiogram (ECG). This is a test that records the electrical signals in the heart.   Ambulatory cardiac monitoring. This is a small recording device that is connected by wires to flat, sticky disks (electrodes) that are attached to your chest.   An echocardiogram. This is a test that uses  sound waves to create pictures of your heart.   A transesophageal echocardiogram (TEE). In this test, a device is placed down your esophagus. This device then uses sounds waves to create even closer pictures of your heart.   Stress test. This test records your heartbeat while you exercise and checks to see if the heart muscle is receiving adequate blood supply.  How is this treated?  This condition may be treated with:   Medicines to:  ? Make your heart beat more slowly.  ? Keep your heart in normal rhythm.  ? Prevent a stroke.   Cardioversion. This uses medicines or an electrical shock to make the heart beat normally.   Ablation. This destroys the heart tissue that is causing the problem.  In some cases, your health care provider will treat other underlying conditions.  Follow these instructions at home:  Medicines   Take over-the-counter and prescription medicines only as told by your health care provider.  ? Make sure you take your medicines exactly as told by your health care provider.  ? Do not miss any doses.   Do not take any new medicines without talking to your health care provider.  Lifestyle   Eat heart-healthy foods. Talk with a dietitian to make an eating plan that is right for you.   Do not use any products that   contain nicotine or tobacco, such as cigarettes and e-cigarettes. If you need help quitting, ask your health care provider.   Limit alcohol intake to no more than 1 drink per day for nonpregnant women and 2 drinks per day for men. One drink equals 12 oz of beer, 5 oz of wine, or 1 oz of hard liquor.   Try to reduce any stress. Stress can make your symptoms worse.   Get screened for sleep apnea. If you have the condition, work with your health care provider to find a treatment that works for you.   Do not use drugs.   Avoid excessive caffeine.  General instructions   Lose weight if your health care provider tells you to do that.   Keep all follow-up visits as told by your health  care provider. This is important.  Contact a health care provider if:   Your symptoms get worse.   You notice that your palpitations are increasing.  Get help right away if:   You have any symptoms of a stroke. "BE FAST" is an easy way to remember the main warning signs of a stroke:  ? B - Balance. Signs are dizziness, sudden trouble walking, or loss of balance.  ? E - Eyes. Signs are trouble seeing or a sudden change in vision.  ? F - Face. Signs are sudden weakness or numbness of the face, or the face or eyelid drooping on one side.  ? A - Arms. Signs are weakness or numbness in an arm. This happens suddenly and usually on one side of the body.  ? S - Speech. Signs are sudden trouble speaking, slurred speech, or trouble understanding what people say.  ? T - Time. Time to call emergency services. Write down what time symptoms started.   You have other signs of a stroke, such as:  ? A sudden, severe headache with no known cause.  ? Nausea or vomiting.  ? Seizure.   You have additional symptoms, such as:  ? Fainting.  ? Shortness of breath.  ? Pain or pressure in your chest.  ? Suddenly feeling nauseous or suddenly vomiting.  ? Increased sweating with no known cause.   These symptoms may represent a serious problem that is an emergency. Do not wait to see if the symptoms will go away. Get medical help right away. Call your local emergency services (911 in the [place] Do not drive yourself to the hospital.  Summary   Atrial flutter is an abnormal heart rhythm that can give you symptoms of palpitations, shortness of breath, or fatigue.   Atrial flutter is often treated with medicines to keep your heart in a normal rhythm and to prevent a stroke.   You should seek immediate help if you cannot catch your breath, have chest pain or pressure, or have weakness, especially on one side of your body.  This information is not intended to replace advice given to you by your health care provider. Make sure you discuss any  questions you have with your health care provider.  Document Released: 12/20/2008 Document Revised: 04/22/2018 Document Reviewed: 05/06/2017  Elsevier Interactive Patient Education  2019 [employer]

## 2018-11-26 NOTE — Progress Notes (Signed)
Subjective:     Patient ID: Marcus Johnston , male    DOB: Apr 04, 1931 , 85 y.o.   MRN: [id number]   Chief Complaint  Patient presents with  . SVT f/u    HPI  He is here today for f/u SVT. He was seen in March for f/u ER visit.  He mentioned having palpitations, EKG was performed which appeared to reveal SVT.  He was referred to cardiology, seen later the same day and diagnosed with atypical atrial flutter. He was then started on metoprolol and eliquis. He has tolerated both meds without side effects. He has not had to use diltiazem that was prescribed for prn use. He has no new concerns today.     Past Medical History:  Diagnosis Date  . Diabetes mellitus without complication ([place])   . High cholesterol   . History of prostate cancer   . HTN (hypertension)   . Renal insufficiency      Family History  Problem Relation Age of Onset  . Cancer Mother   . Cancer Father      Current Outpatient Medications:  .  albuterol (PROVENTIL HFA;VENTOLIN HFA) 108 (90 Base) MCG/ACT inhaler, Inhale 2 puffs into the lungs every 4 (four) hours as needed for wheezing or shortness of breath (cough, shortness of breath or wheezing.)., Disp: 1 Inhaler, Rfl: 1 .  amLODipine (NORVASC) 5 MG tablet, Take 1 tablet (5 mg total) by mouth 2 (two) times daily., Disp: 180 tablet, Rfl: 1 .  apixaban (ELIQUIS) 2.5 MG TABS tablet, Take 1 tablet (2.5 mg total) by mouth 2 (two) times daily., Disp: 180 tablet, Rfl: 2 .  Cholecalciferol (VITAMIN D3) 50 MCG (2000 UT) TABS, Take 2,000 Units by mouth daily., Disp: , Rfl:  .  diltiazem (CARDIZEM) 30 MG tablet, Take 1 tablet (30 mg total) by mouth every 8 (eight) hours as needed. Palpitations, Disp: 60 tablet, Rfl: 2 .  doxazosin (CARDURA) 4 MG tablet, Take 1 tablet (4 mg total) by mouth at bedtime., Disp: 90 tablet, Rfl: 1 .  EASY TOUCH INSULIN SYRINGE 30G X 5/16" 0.5 ML MISC, USE 3 TIMES DAILY, Disp: 30 each, Rfl: 4 .  fexofenadine (ALLEGRA) 180 MG tablet, Take 180 mg by  mouth daily as needed (allergies.). , Disp: , Rfl:  .  HUMALOG MIX 50/50 (50-50) 100 UNIT/ML SUSP injection, Inject 24 Units into the skin 2 (two) times daily., Disp: , Rfl:  .  hydrochlorothiazide (HYDRODIURIL) 25 MG tablet, Take 25 mg by mouth daily., Disp: , Rfl:  .  Insulin Syringe-Needle U-100 (BD INSULIN SYRINGE U/F) 31G X 5/16" 0.5 ML MISC, USE TO INJECT INSULIN AS DIRECTED, Disp: , Rfl:  .  ketorolac (ACULAR) 0.5 % ophthalmic solution, Place 1 drop into both eyes 3 (three) times daily., Disp: , Rfl:  .  losartan-hydrochlorothiazide (HYZAAR) 100-25 MG tablet, Take 1 tablet by mouth daily., Disp: 90 tablet, Rfl: 1 .  metFORMIN (GLUCOPHAGE-XR) 500 MG 24 hr tablet, Take 500 mg by mouth 2 (two) times daily. , Disp: , Rfl:  .  metoprolol tartrate (LOPRESSOR) 25 MG tablet, Take 1 tablet (25 mg total) by mouth 2 (two) times daily., Disp: 60 tablet, Rfl: 2 .  prednisoLONE acetate (PRED FORTE) 1 % ophthalmic suspension, Place 1 drop into both eyes 4 (four) times daily., Disp: , Rfl:    Allergies  Allergen Reactions  . Statins Other (See Comments)    pain     Review of Systems  Constitutional: Negative.   Respiratory:  Negative.   Cardiovascular: Negative.   Gastrointestinal: Negative.   Neurological: Negative.   Psychiatric/Behavioral: Negative.      Today's Vitals   11/24/18 1029  BP: 120/66  Pulse: 76  Temp: (!) 97.5 F (36.4 C)  TempSrc: Oral  Weight: 240 lb (108.9 kg)  Height: 6\' 3"  (1.905 m)  PainSc: 0-No pain   Body mass index is 30 kg/m.   Objective:  Physical Exam Vitals signs and nursing note reviewed.  Constitutional:      Appearance: Normal appearance.  Cardiovascular:     Rate and Rhythm: Normal rate and regular rhythm.     Heart sounds: Normal heart sounds.  Pulmonary:     Effort: Pulmonary effort is normal.     Breath sounds: Normal breath sounds.  Skin:    General: Skin is warm.  Neurological:     General: No focal deficit present.     Mental Status:  He is alert.  Psychiatric:        Mood and Affect: Mood normal.         Assessment And Plan:     1. Atypical atrial flutter (HCC)  He is currently in sinus rhythm. He is rate controlled. Cardiology notes reviewed in full detail. He is encouraged to f/u with Cardiology as scheduled.   2. Benign hypertensive heart disease without heart failure  Well controlled. He is encouraged to continue with current meds. He is encouraged to stay well hydrated.   3. Current use of long term anticoagulation  He is encouraged to continue with Eliquis. He is advised to ask cardiologist for samples; unfortunately, I do not get samples for this. Pt reports cost is $500. Pt advised he likely needs prior auth; advised that I can help him with this. He elects to discuss further with cardiology.   [name], MD    THE PATIENT IS ENCOURAGED TO PRACTICE SOCIAL DISTANCING DUE TO THE COVID-19 PANDEMIC.

## 2018-12-01 ENCOUNTER — Institutional Professional Consult (permissible substitution): Payer: Medicare Other | Admitting: Neurology

## 2018-12-04 ENCOUNTER — Other Ambulatory Visit: Payer: Self-pay | Admitting: Internal Medicine

## 2018-12-15 ENCOUNTER — Other Ambulatory Visit: Payer: Self-pay

## 2018-12-15 ENCOUNTER — Ambulatory Visit: Payer: Medicare Other | Admitting: Orthotics

## 2018-12-15 ENCOUNTER — Other Ambulatory Visit: Payer: Self-pay | Admitting: Cardiology

## 2018-12-15 DIAGNOSIS — M79676 Pain in unspecified toe(s): Principal | ICD-10-CM

## 2018-12-15 DIAGNOSIS — B351 Tinea unguium: Secondary | ICD-10-CM

## 2018-12-15 DIAGNOSIS — I4892 Unspecified atrial flutter: Secondary | ICD-10-CM

## 2018-12-15 DIAGNOSIS — E119 Type 2 diabetes mellitus without complications: Secondary | ICD-10-CM

## 2018-12-15 DIAGNOSIS — Z794 Long term (current) use of insulin: Secondary | ICD-10-CM

## 2018-12-15 MED ORDER — METOPROLOL TARTRATE 25 MG PO TABS: 25.0000 mg | ORAL_TABLET | Freq: Two times a day (BID) | ORAL | 1 refills | Status: DC

## 2018-12-15 NOTE — Progress Notes (Signed)
Patient was unaware that he would be responsible for 20 percent co-pay and decided NOT to get them at this time.Marcus Johnston

## 2019-01-06 ENCOUNTER — Other Ambulatory Visit: Payer: Medicare Other

## 2019-01-20 ENCOUNTER — Ambulatory Visit (INDEPENDENT_AMBULATORY_CARE_PROVIDER_SITE_OTHER): Payer: Medicare Other

## 2019-01-20 ENCOUNTER — Other Ambulatory Visit: Payer: Self-pay

## 2019-01-20 DIAGNOSIS — I4892 Unspecified atrial flutter: Secondary | ICD-10-CM

## 2019-01-25 ENCOUNTER — Institutional Professional Consult (permissible substitution): Payer: Medicare Other | Admitting: Neurology

## 2019-02-10 ENCOUNTER — Other Ambulatory Visit: Payer: Self-pay

## 2019-02-10 ENCOUNTER — Ambulatory Visit (INDEPENDENT_AMBULATORY_CARE_PROVIDER_SITE_OTHER): Payer: Medicare Other | Admitting: Cardiology

## 2019-02-10 ENCOUNTER — Encounter: Payer: Self-pay | Admitting: Cardiology

## 2019-02-10 VITALS — BP 151/67 | HR 56 | Temp 98.4°F | Ht 75.0 in | Wt 238.0 lb

## 2019-02-10 DIAGNOSIS — I10 Essential (primary) hypertension: Secondary | ICD-10-CM | POA: Diagnosis not present

## 2019-02-10 DIAGNOSIS — I4892 Unspecified atrial flutter: Secondary | ICD-10-CM | POA: Diagnosis not present

## 2019-02-10 NOTE — Progress Notes (Signed)
Patient referred by Glendale Chard, MD for tachycardia  Subjective:   Marcus Johnston, male    DOB: 12/12/1930, 85 y.o.   MRN: [id number]   Chief Complaint  Patient presents with  . Hypertension    3 mnth f/u   HPI  85 year old African-American male with hypertension, hyperlipidemia, type 2 diabetes mellitus, h/o CVA, CKD 3, seen by me for atypical atrial flutter.  Patient self converted to sinus rhythm, thus did not require to undergo cardioversion. He has not had any recurrence of palpitations. BP elevated today, but was lower at PCP visit recently. He denies chest pain, shortness of breath, palpitations, leg edema, orthopnea, PND, TIA/syncope.  Past Medical History:  Diagnosis Date  . Diabetes mellitus without complication ([place])   . High cholesterol   . History of prostate cancer   . HTN (hypertension)   . Renal insufficiency      Past Surgical History:  Procedure Laterality Date  . prostate implant       Social History   Socioeconomic History  . Marital status: Married    Spouse name: Not on file  . Number of children: 5  . Years of education: Not on file  . Highest education level: Not on file  Occupational History  . Not on file  Social Needs  . Financial resource strain: Not on file  . Food insecurity    Worry: Not on file    Inability: Not on file  . Transportation needs    Medical: Not on file    Non-medical: Not on file  Tobacco Use  . Smoking status: Never Smoker  . Smokeless tobacco: Never Used  Substance and Sexual Activity  . Alcohol use: No  . Drug use: No  . Sexual activity: Not on file  Lifestyle  . Physical activity    Days per week: Not on file    Minutes per session: Not on file  . Stress: Not on file  Relationships  . Social Herbalist on phone: Not on file    Gets together: Not on file    Attends religious service: Not on file    Active member of club or organization: Not on file    Attends meetings of clubs or  organizations: Not on file    Relationship status: Not on file  . Intimate partner violence    Fear of current or ex partner: Not on file    Emotionally abused: Not on file    Physically abused: Not on file    Forced sexual activity: Not on file  Other Topics Concern  . Not on file  Social History Narrative  . Not on file     Current Outpatient Medications on File Prior to Visit  Medication Sig Dispense Refill  . albuterol (PROVENTIL HFA;VENTOLIN HFA) 108 (90 Base) MCG/ACT inhaler Inhale 2 puffs into the lungs every 4 (four) hours as needed for wheezing or shortness of breath (cough, shortness of breath or wheezing.). 1 Inhaler 1  . amLODipine (NORVASC) 5 MG tablet Take 1 tablet (5 mg total) by mouth 2 (two) times daily. 180 tablet 1  . apixaban (ELIQUIS) 2.5 MG TABS tablet Take 1 tablet (2.5 mg total) by mouth 2 (two) times daily. 180 tablet 2  . Cholecalciferol (VITAMIN D3) 50 MCG (2000 UT) TABS Take 2,000 Units by mouth daily.    Marland Kitchen diltiazem (CARDIZEM) 30 MG tablet Take 1 tablet (30 mg total) by mouth every 8 (eight) hours as  needed. Palpitations 60 tablet 2  . doxazosin (CARDURA) 4 MG tablet Take 1 tablet (4 mg total) by mouth at bedtime. 90 tablet 1  . EASY TOUCH INSULIN SYRINGE 30G X 5/16" 0.5 ML MISC USE 3 TIMES DAILY 30 each 4  . fexofenadine (ALLEGRA) 180 MG tablet Take 180 mg by mouth daily as needed (allergies.).     Marland Kitchen HUMALOG MIX 50/50 (50-50) 100 UNIT/ML SUSP injection Inject 24 Units into the skin 2 (two) times daily.    . hydrochlorothiazide (HYDRODIURIL) 25 MG tablet Take 25 mg by mouth daily.    . Insulin Syringe-Needle U-100 (BD INSULIN SYRINGE U/F) 31G X 5/16" 0.5 ML MISC USE TO INJECT INSULIN AS DIRECTED    . ketorolac (ACULAR) 0.5 % ophthalmic solution Place 1 drop into both eyes 3 (three) times daily.    Marland Kitchen losartan-hydrochlorothiazide (HYZAAR) 100-25 MG tablet TAKE 1 TABLET BY MOUTH  EVERY DAY 90 tablet 1  . metFORMIN (GLUCOPHAGE-XR) 500 MG 24 hr tablet Take 500 mg  by mouth 2 (two) times daily.     . metoprolol tartrate (LOPRESSOR) 25 MG tablet Take 1 tablet (25 mg total) by mouth 2 (two) times daily. 180 tablet 1  . prednisoLONE acetate (PRED FORTE) 1 % ophthalmic suspension Place 1 drop into both eyes 4 (four) times daily.     No current facility-administered medications on file prior to visit.     Cardiovascular studies:  Echocardiogram 01/20/2019: Normal LV systolic function with EF 59%. Left ventricle cavity is normal in size. Moderate concentric hypertrophy of the left ventricle. Normal global wall motion. Doppler evidence of grade I (impaired) diastolic dysfunction, normal LAP. Calculated EF 59%. Moderate (Grade II) mitral regurgitation. Moderate tricuspid regurgitation. Estimated pulmonary artery systolic pressure 40 mmHg.   EKG 11/07/2018: Sinus rhythm 59 bpm RIght atrial enlargement Left axis deviation.  Baseline artifact.   EKG 10/17/2018: Atypical atrial flutter with RVR 160 bpm. Nonspecific ST-T abnormality  Vascular US Carotid Bilateral 11/14/2016: intimal wall thickness CCA. 1-39% ICA plaquing. Vertebral artery flow is antegrade.  MRI Brain 11/12/2016: 1. No acute intracranial abnormality identified. 2. Moderate chronic microvascular ischemic changes and moderate parenchymal volume loss of the brain. 3. Scattered punctate foci of susceptibility hypointensity compatible with hemosiderin deposition, probably related to chronic hypertension, less likely cerebral amyloid angiopathy. 4. Normal MRA of the head.  Treadmill stress test 08/26/2015: Comments: Indication: Tachycardia The patient exercised according to Bruce Protocol, Total time recorded 6:01 min achieving max heart rate of 164 which was 95 % of MPHR for age and 7.05 METS of work. Normal BP response. Resting ECG showing tachycardia. @ 105/min. There was no ST-T changes of ischemia with exercise stress test. No arrythmias noted during exercise. Stress terminated due to  shortness of breath and THR >85% met.  Review of Systems  Constitution: Negative for decreased appetite, malaise/fatigue, weight gain and weight loss.  HENT: Negative for congestion.   Eyes: Negative for visual disturbance.  Cardiovascular: Positive for palpitations. Negative for chest pain, dyspnea on exertion, leg swelling and syncope.  Respiratory: Negative for shortness of breath.   Endocrine: Negative for cold intolerance.  Hematologic/Lymphatic: Does not bruise/bleed easily.  Skin: Negative for itching and rash.  Musculoskeletal: Negative for myalgias.  Gastrointestinal: Negative for abdominal pain, nausea and vomiting.  Genitourinary: Negative for dysuria.  Neurological: Positive for dizziness and light-headedness. Negative for weakness.  Psychiatric/Behavioral: The patient is not nervous/anxious.   All other systems reviewed and are negative.  Vitals:   02/10/19 1047 02/10/19 1113  BP: (!) 160/69 (!) 151/67  Pulse: 60 (!) 56  Temp: 98.4 F (36.9 C)   SpO2: 96% 94%    Objective:   Physical Exam  Constitutional: He is oriented to person, place, and time. He appears well-developed and well-nourished. No distress.  Aageppears younger than stated   HENT:  Head: Normocephalic and atraumatic.  Eyes: Pupils are equal, round, and reactive to light. Conjunctivae are normal.  Neck: No JVD present.  Cardiovascular: Normal rate and regular rhythm.  No murmur heard. Pulmonary/Chest: Effort normal and breath sounds normal. He has no wheezes. He has no rales.  Abdominal: Soft. Bowel sounds are normal. There is no rebound.  Musculoskeletal:        General: No edema.  Lymphadenopathy:    He has no cervical adenopathy.  Neurological: He is alert and oriented to person, place, and time. No cranial nerve deficit.  Skin: Skin is warm and dry.  Psychiatric: He has a normal mood and affect.  Nursing note and vitals reviewed.         Assessment & Recommendations:    85 year old African-American male with hypertension, hyperlipidemia, type 2 diabetes mellitus, h/o CVA, CKD 3, paroxysmal atrial flutter.   1. Paroxysmal atrial flutter ([place]) Self converted to sinus rhythm in 10/2018. CHA2DS2VASc score 5, annual stroke risk 7%. Given patient's TIA with no other etiology found in 2017, I would recommend continuing Eliquis indefinitely at 2.5 mg bid.  Continue metoprolol 25 mg bid and diltiazem 30 mg prn.  2. Essential hypertension Suboptimal control. He has upcoming visit with PCP next month.   3. Leg edema Improved.   I will see him back in 6 months.   Nigel Mormon, MD [facility] [place] Pager: [phone] Office: [phone] If no answer Cell [phone]

## 2019-02-14 ENCOUNTER — Encounter: Payer: Self-pay | Admitting: Podiatry

## 2019-02-14 ENCOUNTER — Other Ambulatory Visit: Payer: Self-pay

## 2019-02-14 ENCOUNTER — Ambulatory Visit: Payer: Medicare Other | Admitting: Podiatry

## 2019-02-14 VITALS — Temp 98.0°F

## 2019-02-14 DIAGNOSIS — M79676 Pain in unspecified toe(s): Secondary | ICD-10-CM | POA: Diagnosis not present

## 2019-02-14 DIAGNOSIS — B351 Tinea unguium: Secondary | ICD-10-CM

## 2019-02-14 NOTE — Progress Notes (Signed)
Subjective:  Marcus Johnston presents to clinic today with cc of  painful, thick, discolored, elongated toenails 1-5 b/l that become tender and cannot cut because of thickness.  Pain is aggravated when wearing enclosed shoe gear and relieved with periodic professional debridement.  Glendale Chard, MD is his PCP. Last visit was 11/24/2018.  He voices no new pedal concerns on today's visit.   Current Outpatient Medications:  .  albuterol (PROVENTIL HFA;VENTOLIN HFA) 108 (90 Base) MCG/ACT inhaler, Inhale 2 puffs into the lungs every 4 (four) hours as needed for wheezing or shortness of breath (cough, shortness of breath or wheezing.)., Disp: 1 Inhaler, Rfl: 1 .  amLODipine (NORVASC) 5 MG tablet, Take 1 tablet (5 mg total) by mouth 2 (two) times daily., Disp: 180 tablet, Rfl: 1 .  apixaban (ELIQUIS) 2.5 MG TABS tablet, Take 1 tablet (2.5 mg total) by mouth 2 (two) times daily., Disp: 180 tablet, Rfl: 2 .  Cholecalciferol (VITAMIN D3) 50 MCG (2000 UT) TABS, Take 2,000 Units by mouth daily., Disp: , Rfl:  .  diltiazem (CARDIZEM) 30 MG tablet, Take 1 tablet (30 mg total) by mouth every 8 (eight) hours as needed. Palpitations, Disp: 60 tablet, Rfl: 2 .  doxazosin (CARDURA) 4 MG tablet, Take 1 tablet (4 mg total) by mouth at bedtime., Disp: 90 tablet, Rfl: 1 .  EASY TOUCH INSULIN SYRINGE 30G X 5/16" 0.5 ML MISC, USE 3 TIMES DAILY, Disp: 30 each, Rfl: 4 .  fexofenadine (ALLEGRA) 180 MG tablet, Take 180 mg by mouth daily as needed (allergies.). , Disp: , Rfl:  .  HUMALOG MIX 50/50 (50-50) 100 UNIT/ML SUSP injection, Inject 24 Units into the skin 2 (two) times daily., Disp: , Rfl:  .  hydrochlorothiazide (HYDRODIURIL) 25 MG tablet, Take 25 mg by mouth daily., Disp: , Rfl:  .  Insulin Syringe-Needle U-100 (BD INSULIN SYRINGE U/F) 31G X 5/16" 0.5 ML MISC, USE TO INJECT INSULIN AS DIRECTED, Disp: , Rfl:  .  ketorolac (ACULAR) 0.5 % ophthalmic solution, Place 1 drop into both eyes 3 (three) times daily.,  Disp: , Rfl:  .  losartan-hydrochlorothiazide (HYZAAR) 100-25 MG tablet, , Disp: , Rfl:  .  metFORMIN (GLUCOPHAGE-XR) 500 MG 24 hr tablet, Take 500 mg by mouth 4 (four) times daily. , Disp: , Rfl:  .  metoprolol tartrate (LOPRESSOR) 25 MG tablet, Take 1 tablet (25 mg total) by mouth 2 (two) times daily., Disp: 180 tablet, Rfl: 1   Allergies  Allergen Reactions  . Statins Other (See Comments)    pain     Objective: Vitals:   02/14/19 1122  Temp: 98 F (36.7 C)    Physical Examination:  Vascular Examination: Capillary refill time immediate x 10 digits.  Palpable DP/PT pulses b/l.  Digital hair absent b/l.Marland Kitchen  No edema noted b/l.  Skin temperature gradient WNL b/l.  Dermatological Examination: Skin with normal turgor, texture and tone b/l.  No open wounds b/l.  No interdigital macerations noted b/l.  Elongated, thick, discolored brittle toenails with subungual debris and pain on dorsal palpation of nailbeds 1-5 b/l.  Musculoskeletal Examination: Muscle strength 5/5 to all muscle groups b/l.  No pain, crepitus or joint discomfort with active/passive ROM.  Neurological Examination: Sensation intact 5/5 b/l with 10 gram monofilament.  Assessment: Mycotic nail infection with pain 1-5 b/l  Plan: 1. Toenails 1-5 b/l were debrided in length and girth without iatrogenic laceration. 2.  Continue soft, supportive shoe gear daily. 3.  Report any pedal injuries to medical  professional. 4.  Follow up 3 months. 5.  Patient/POA to call should there be a question/concern in there interim.

## 2019-03-23 ENCOUNTER — Ambulatory Visit (INDEPENDENT_AMBULATORY_CARE_PROVIDER_SITE_OTHER): Payer: Medicare Other

## 2019-03-23 ENCOUNTER — Other Ambulatory Visit: Payer: Self-pay

## 2019-03-23 ENCOUNTER — Ambulatory Visit (INDEPENDENT_AMBULATORY_CARE_PROVIDER_SITE_OTHER): Payer: Medicare Other | Admitting: Internal Medicine

## 2019-03-23 ENCOUNTER — Encounter: Payer: Self-pay | Admitting: Internal Medicine

## 2019-03-23 VITALS — BP 160/80 | HR 98 | Temp 97.8°F | Ht 73.2 in | Wt 238.8 lb

## 2019-03-23 VITALS — BP 160/80 | HR 69 | Temp 97.8°F | Ht 73.2 in | Wt 238.8 lb

## 2019-03-23 DIAGNOSIS — E6609 Other obesity due to excess calories: Secondary | ICD-10-CM

## 2019-03-23 DIAGNOSIS — Z0000 Encounter for general adult medical examination without abnormal findings: Secondary | ICD-10-CM

## 2019-03-23 DIAGNOSIS — I129 Hypertensive chronic kidney disease with stage 1 through stage 4 chronic kidney disease, or unspecified chronic kidney disease: Secondary | ICD-10-CM | POA: Diagnosis not present

## 2019-03-23 DIAGNOSIS — E1159 Type 2 diabetes mellitus with other circulatory complications: Secondary | ICD-10-CM

## 2019-03-23 DIAGNOSIS — Z6831 Body mass index (BMI) 31.0-31.9, adult: Secondary | ICD-10-CM

## 2019-03-23 DIAGNOSIS — R351 Nocturia: Secondary | ICD-10-CM | POA: Diagnosis not present

## 2019-03-23 LAB — POCT URINALYSIS DIPSTICK
Bilirubin, UA: NEGATIVE
Glucose, UA: POSITIVE — AB
Ketones, UA: NEGATIVE
Leukocytes, UA: NEGATIVE
Nitrite, UA: NEGATIVE
Protein, UA: POSITIVE — AB
Spec Grav, UA: 1.025 (ref 1.010–1.025)
Urobilinogen, UA: 0.2 E.U./dL
pH, UA: 5 (ref 5.0–8.0)

## 2019-03-23 LAB — POCT UA - MICROALBUMIN
Creatinine, POC: 200 mg/dL
Microalbumin Ur, POC: 150 mg/L

## 2019-03-23 NOTE — Addendum Note (Signed)
Addended by: Kellie Simmering on: 03/23/2019 04:41 PM   Modules accepted: Orders

## 2019-03-23 NOTE — Patient Instructions (Signed)

## 2019-03-23 NOTE — Progress Notes (Signed)
Subjective:   Marcus Johnston is a 85 y.o. male who presents for Medicare Annual/Subsequent preventive examination.  Review of Systems:  n/a Cardiac Risk Factors include: advanced age (>82men, >23 women);diabetes mellitus;hypertension;male gender;obesity (BMI >30kg/m2);sedentary lifestyle     Objective:    Vitals: BP (!) 160/80 (BP Location: Left Arm, Patient Position: Sitting, Cuff Size: Normal)   Pulse 69   Temp 97.8 F (36.6 C) (Oral)   Ht 6' 1.2" (1.859 m)   Wt 238 lb 12.8 oz (108.3 kg)   BMI 31.33 kg/m   Body mass index is 31.33 kg/m.  Advanced Directives 03/23/2019 10/28/2018 10/09/2018 11/13/2016 11/12/2016  Does Patient Have a Medical Advance Directive? No No No No No  Would patient like information on creating a medical advance directive? No - Patient declined No - Patient declined No - Patient declined No - Patient declined -    Tobacco Social History   Tobacco Use  Smoking Status Never Smoker  Smokeless Tobacco Never Used     Counseling given: Not Answered   Clinical Intake:  Pre-visit preparation completed: Yes  Pain : No/denies pain     Nutritional Status: BMI > 30  Obese Nutritional Risks: Nausea/ vomitting/ diarrhea(diarrhea a week ago) Diabetes: Yes CBG done?: No Did pt. bring in CBG monitor from home?: No  How often do you need to have someone help you when you read instructions, pamphlets, or other written materials from your doctor or pharmacy?: 1 - Never What is the last grade level you completed in school?: associate degree  Interpreter Needed?: No  Information entered by :: NAllen LPN  Past Medical History:  Diagnosis Date  . Diabetes mellitus without complication ([place])   . High cholesterol   . History of prostate cancer   . HTN (hypertension)   . Renal insufficiency    Past Surgical History:  Procedure Laterality Date  . prostate implant     Family History  Problem Relation Age of Onset  . Cancer Mother   . Cancer Father    Social History   Socioeconomic History  . Marital status: Married    Spouse name: Not on file  . Number of children: 5  . Years of education: Not on file  . Highest education level: Not on file  Occupational History  . Occupation: retired  Scientific laboratory technician  . Financial resource strain: Not hard at all  . Food insecurity    Worry: Never true    Inability: Never true  . Transportation needs    Medical: No    Non-medical: No  Tobacco Use  . Smoking status: Never Smoker  . Smokeless tobacco: Never Used  Substance and Sexual Activity  . Alcohol use: No  . Drug use: No  . Sexual activity: Yes  Lifestyle  . Physical activity    Days per week: 0 days    Minutes per session: 0 min  . Stress: Not at all  Relationships  . Social Herbalist on phone: Not on file    Gets together: Not on file    Attends religious service: Not on file    Active member of club or organization: Not on file    Attends meetings of clubs or organizations: Not on file    Relationship status: Not on file  Other Topics Concern  . Not on file  Social History Narrative  . Not on file    Outpatient Encounter Medications as of 03/23/2019  Medication Sig  .  albuterol (PROVENTIL HFA;VENTOLIN HFA) 108 (90 Base) MCG/ACT inhaler Inhale 2 puffs into the lungs every 4 (four) hours as needed for wheezing or shortness of breath (cough, shortness of breath or wheezing.).  Marland Kitchen amLODipine (NORVASC) 5 MG tablet Take 1 tablet (5 mg total) by mouth 2 (two) times daily.  Marland Kitchen apixaban (ELIQUIS) 2.5 MG TABS tablet Take 1 tablet (2.5 mg total) by mouth 2 (two) times daily.  . Cholecalciferol (VITAMIN D3) 50 MCG (2000 UT) TABS Take 2,000 Units by mouth daily.  Marland Kitchen diltiazem (CARDIZEM) 30 MG tablet Take 1 tablet (30 mg total) by mouth every 8 (eight) hours as needed. Palpitations  . doxazosin (CARDURA) 4 MG tablet Take 1 tablet (4 mg total) by mouth at bedtime.  Marland Kitchen EASY TOUCH INSULIN SYRINGE 30G X 5/16" 0.5 ML MISC USE 3 TIMES  DAILY  . fexofenadine (ALLEGRA) 180 MG tablet Take 180 mg by mouth daily as needed (allergies.).   Marland Kitchen HUMALOG MIX 50/50 (50-50) 100 UNIT/ML SUSP injection Inject 24 Units into the skin 2 (two) times daily.  . Insulin Syringe-Needle U-100 (BD INSULIN SYRINGE U/F) 31G X 5/16" 0.5 ML MISC USE TO INJECT INSULIN AS DIRECTED  . ketorolac (ACULAR) 0.5 % ophthalmic solution Place 1 drop into both eyes 3 (three) times daily.  Marland Kitchen losartan-hydrochlorothiazide (HYZAAR) 100-25 MG tablet   . metFORMIN (GLUCOPHAGE-XR) 500 MG 24 hr tablet Take 500 mg by mouth 4 (four) times daily.   . hydrochlorothiazide (HYDRODIURIL) 25 MG tablet Take 25 mg by mouth daily.  . metoprolol tartrate (LOPRESSOR) 25 MG tablet Take 1 tablet (25 mg total) by mouth 2 (two) times daily.   No facility-administered encounter medications on file as of 03/23/2019.     Activities of Daily Living In your present state of health, do you have any difficulty performing the following activities: 03/23/2019  Hearing? N  Vision? N  Difficulty concentrating or making decisions? N  Walking or climbing stairs? N  Dressing or bathing? N  Doing errands, shopping? N  Preparing Food and eating ? N  Using the Toilet? N  In the past six months, have you accidently leaked urine? N  Do you have problems with loss of bowel control? N  Managing your Medications? N  Managing your Finances? N  Housekeeping or managing your Housekeeping? N  Some recent data might be hidden    Patient Care Team: Glendale Chard, MD as PCP - General (Internal Medicine)   Assessment:   This is a routine wellness examination for Marcus Johnston.  Exercise Activities and Dietary recommendations Current Exercise Habits: The patient does not participate in regular exercise at present  Goals    . Patient Stated     03/23/2019, no goals       Fall Risk Fall Risk  03/23/2019 10/17/2018 09/12/2018  Falls in the past year? 0 0 0  Risk for fall due to : Medication side effect - -   Follow up Falls evaluation completed;Education provided;Falls prevention discussed - -   Is the patient's home free of loose throw rugs in walkways, pet beds, electrical cords, etc?   yes      Grab bars in the bathroom? no      Handrails on the stairs?   yes      Adequate lighting?   yes  Timed Get Up and Go Performed: n/a  Depression Screen PHQ 2/9 Scores 03/23/2019 10/17/2018 09/12/2018  PHQ - 2 Score 0 0 0  PHQ- 9 Score 0 - -  Cognitive Function     6CIT Screen 03/23/2019  What Year? 0 points  What month? 0 points  What time? 0 points  Count back from 20 0 points  Months in reverse 2 points  Repeat phrase 0 points  Total Score 2     There is no immunization history on file for this patient.  Qualifies for Shingles Vaccine? yes  Screening Tests Health Maintenance  Topic Date Due  . PNA vac Low Risk Adult (1 of 2 - PCV13) 04/22/1998  . HEMOGLOBIN A1C  12/28/2018  . INFLUENZA VACCINE  03/18/2019  . OPHTHALMOLOGY EXAM  05/20/2019  . FOOT EXAM  11/16/2019  . TETANUS/TDAP  07/20/2021   Cancer Screenings: Lung: Low Dose CT Chest recommended if Age 85-80 years, 30 pack-year currently smoking OR have quit w/in 15years. Patient does not qualify. Colorectal: not required  Additional Screenings:  Hepatitis C Screening:n/a      Plan:    Patient has no goals set. Declines pneumonia vaccine. States had one once and got pneumonia.  I have personally reviewed and noted the following in the patient's chart:   . Medical and social history . Use of alcohol, tobacco or illicit drugs  . Current medications and supplements . Functional ability and status . Nutritional status . Physical activity . Advanced directives . List of other physicians . Hospitalizations, surgeries, and ER visits in previous 12 months . Vitals . Screenings to include cognitive, depression, and falls . Referrals and appointments  In addition, I have reviewed and discussed with patient certain  preventive protocols, quality metrics, and best practice recommendations. A written personalized care plan for preventive services as well as general preventive health recommendations were provided to patient.     Kellie Simmering, LPN  01/19/9934

## 2019-03-23 NOTE — Patient Instructions (Signed)
Marcus Johnston , Thank you for taking time to come for your Medicare Wellness Visit. I appreciate your ongoing commitment to your health goals. Please review the following plan we discussed and let me know if I can assist you in the future.   Screening recommendations/referrals: Colonoscopy: not required Recommended yearly ophthalmology/optometry visit for glaucoma screening and checkup Recommended yearly dental visit for hygiene and checkup  Vaccinations: Influenza vaccine: 04/2018 Pneumococcal vaccine: declines Tdap vaccine: 07/2011 Shingles vaccine: discussed    Advanced directives: Advance directive discussed with you today. Even though you declined this today please call our office should you change your mind and we can give you the proper paperwork for you to fill out.   Conditions/risks identified: obesity  Next appointment: 03/23/2019  Preventive Care 83 Years and Older, Male Preventive care refers to lifestyle choices and visits with your health care provider that can promote health and wellness. What does preventive care include?  A yearly physical exam. This is also called an annual well check.  Dental exams once or twice a year.  Routine eye exams. Ask your health care provider how often you should have your eyes checked.  Personal lifestyle choices, including:  Daily care of your teeth and gums.  Regular physical activity.  Eating a healthy diet.  Avoiding tobacco and drug use.  Limiting alcohol use.  Practicing safe sex.  Taking low doses of aspirin every day.  Taking vitamin and mineral supplements as recommended by your health care provider. What happens during an annual well check? The services and screenings done by your health care provider during your annual well check will depend on your age, overall health, lifestyle risk factors, and family history of disease. Counseling  Your health care provider may ask you questions about your:  Alcohol use.   Tobacco use.  Drug use.  Emotional well-being.  Home and relationship well-being.  Sexual activity.  Eating habits.  History of falls.  Memory and ability to understand (cognition).  Work and work Statistician. Screening  You may have the following tests or measurements:  Height, weight, and BMI.  Blood pressure.  Lipid and cholesterol levels. These may be checked every 5 years, or more frequently if you are over 56 years old.  Skin check.  Lung cancer screening. You may have this screening every year starting at age 38 if you have a 30-pack-year history of smoking and currently smoke or have quit within the past 15 years.  Fecal occult blood test (FOBT) of the stool. You may have this test every year starting at age 34.  Flexible sigmoidoscopy or colonoscopy. You may have a sigmoidoscopy every 5 years or a colonoscopy every 10 years starting at age 58.  Prostate cancer screening. Recommendations will vary depending on your family history and other risks.  Hepatitis C blood test.  Hepatitis B blood test.  Sexually transmitted disease (STD) testing.  Diabetes screening. This is done by checking your blood sugar (glucose) after you have not eaten for a while (fasting). You may have this done every 1-3 years.  Abdominal aortic aneurysm (AAA) screening. You may need this if you are a current or former smoker.  Osteoporosis. You may be screened starting at age 10 if you are at high risk. Talk with your health care provider about your test results, treatment options, and if necessary, the need for more tests. Vaccines  Your health care provider may recommend certain vaccines, such as:  Influenza vaccine. This is recommended every year.  Tetanus, diphtheria, and acellular pertussis (Tdap, Td) vaccine. You may need a Td booster every 10 years.  Zoster vaccine. You may need this after age 82.  Pneumococcal 13-valent conjugate (PCV13) vaccine. One dose is recommended  after age 74.  Pneumococcal polysaccharide (PPSV23) vaccine. One dose is recommended after age 59. Talk to your health care provider about which screenings and vaccines you need and how often you need them. This information is not intended to replace advice given to you by your health care provider. Make sure you discuss any questions you have with your health care provider. Document Released: 08/30/2015 Document Revised: 04/22/2016 Document Reviewed: 06/04/2015 Elsevier Interactive Patient Education  2017 [place] Prevention in the Home Falls can cause injuries. They can happen to people of all ages. There are many things you can do to make your home safe and to help prevent falls. What can I do on the outside of my home?  Regularly fix the edges of walkways and driveways and fix any cracks.  Remove anything that might make you trip as you walk through a door, such as a raised step or threshold.  Trim any bushes or trees on the path to your home.  Use bright outdoor lighting.  Clear any walking paths of anything that might make someone trip, such as rocks or tools.  Regularly check to see if handrails are loose or broken. Make sure that both sides of any steps have handrails.  Any raised decks and porches should have guardrails on the edges.  Have any leaves, snow, or ice cleared regularly.  Use sand or salt on walking paths during winter.  Clean up any spills in your garage right away. This includes oil or grease spills. What can I do in the bathroom?  Use night lights.  Install grab bars by the toilet and in the tub and shower. Do not use towel bars as grab bars.  Use non-skid mats or decals in the tub or shower.  If you need to sit down in the shower, use a plastic, non-slip stool.  Keep the floor dry. Clean up any water that spills on the floor as soon as it happens.  Remove soap buildup in the tub or shower regularly.  Attach bath mats securely with  double-sided non-slip rug tape.  Do not have throw rugs and other things on the floor that can make you trip. What can I do in the bedroom?  Use night lights.  Make sure that you have a light by your bed that is easy to reach.  Do not use any sheets or blankets that are too big for your bed. They should not hang down onto the floor.  Have a firm chair that has side arms. You can use this for support while you get dressed.  Do not have throw rugs and other things on the floor that can make you trip. What can I do in the kitchen?  Clean up any spills right away.  Avoid walking on wet floors.  Keep items that you use a lot in easy-to-reach places.  If you need to reach something above you, use a strong step stool that has a grab bar.  Keep electrical cords out of the way.  Do not use floor polish or wax that makes floors slippery. If you must use wax, use non-skid floor wax.  Do not have throw rugs and other things on the floor that can make you trip. What can I do  with my stairs?  Do not leave any items on the stairs.  Make sure that there are handrails on both sides of the stairs and use them. Fix handrails that are broken or loose. Make sure that handrails are as long as the stairways.  Check any carpeting to make sure that it is firmly attached to the stairs. Fix any carpet that is loose or worn.  Avoid having throw rugs at the top or bottom of the stairs. If you do have throw rugs, attach them to the floor with carpet tape.  Make sure that you have a light switch at the top of the stairs and the bottom of the stairs. If you do not have them, ask someone to add them for you. What else can I do to help prevent falls?  Wear shoes that:  Do not have high heels.  Have rubber bottoms.  Are comfortable and fit you well.  Are closed at the toe. Do not wear sandals.  If you use a stepladder:  Make sure that it is fully opened. Do not climb a closed stepladder.  Make  sure that both sides of the stepladder are locked into place.  Ask someone to hold it for you, if possible.  Clearly mark and make sure that you can see:  Any grab bars or handrails.  First and last steps.  Where the edge of each step is.  Use tools that help you move around (mobility aids) if they are needed. These include:  Canes.  Walkers.  Scooters.  Crutches.  Turn on the lights when you go into a dark area. Replace any light bulbs as soon as they burn out.  Set up your furniture so you have a clear path. Avoid moving your furniture around.  If any of your floors are uneven, fix them.  If there are any pets around you, be aware of where they are.  Review your medicines with your doctor. Some medicines can make you feel dizzy. This can increase your chance of falling. Ask your doctor what other things that you can do to help prevent falls. This information is not intended to replace advice given to you by your health care provider. Make sure you discuss any questions you have with your health care provider. Document Released: 05/30/2009 Document Revised: 01/09/2016 Document Reviewed: 09/07/2014 Elsevier Interactive Patient Education  2017 [employer].

## 2019-03-24 LAB — CMP14+EGFR
ALT: 9 IU/L (ref 0–44)
AST: 13 IU/L (ref 0–40)
Albumin/Globulin Ratio: 1.7 (ref 1.2–2.2)
Albumin: 4.1 g/dL (ref 3.6–4.6)
Alkaline Phosphatase: 86 IU/L (ref 39–117)
BUN/Creatinine Ratio: 18 (ref 10–24)
BUN: 25 mg/dL (ref 8–27)
Bilirubin Total: 0.5 mg/dL (ref 0.0–1.2)
CO2: 22 mmol/L (ref 20–29)
Calcium: 9.8 mg/dL (ref 8.6–10.2)
Chloride: 104 mmol/L (ref 96–106)
Creatinine, Ser: 1.39 mg/dL — ABNORMAL HIGH (ref 0.76–1.27)
GFR calc Af Amer: 53 mL/min/{1.73_m2} — ABNORMAL LOW (ref >59)
GFR calc non Af Amer: 46 mL/min/{1.73_m2} — ABNORMAL LOW (ref >59)
Globulin, Total: 2.4 g/dL (ref 1.5–4.5)
Glucose: 225 mg/dL — ABNORMAL HIGH (ref 65–99)
Potassium: 4.1 mmol/L (ref 3.5–5.2)
Sodium: 140 mmol/L (ref 134–144)
Total Protein: 6.5 g/dL (ref 6.0–8.5)

## 2019-03-24 LAB — PSA, TOTAL AND FREE
PSA, Free Pct: 30 %
PSA, Free: 0.06 ng/mL
Prostate Specific Ag, Serum: 0.2 ng/mL (ref 0.0–4.0)

## 2019-03-24 LAB — LIPID PANEL
Chol/HDL Ratio: 2.1 ratio (ref 0.0–5.0)
Cholesterol, Total: 158 mg/dL (ref 100–199)
HDL: 76 mg/dL (ref >39)
LDL Calculated: 68 mg/dL (ref 0–99)
Triglycerides: 72 mg/dL (ref 0–149)
VLDL Cholesterol Cal: 14 mg/dL (ref 5–40)

## 2019-03-25 ENCOUNTER — Other Ambulatory Visit: Payer: Self-pay | Admitting: Cardiology

## 2019-03-25 DIAGNOSIS — I10 Essential (primary) hypertension: Secondary | ICD-10-CM

## 2019-03-27 NOTE — Progress Notes (Signed)
Subjective:     Patient ID: Marcus Johnston , male    DOB: 1931-04-19 , 85 y.o.   MRN: [id number]   Chief Complaint  Patient presents with  . Diabetes  . Hypertension    HPI  Diabetes He presents for his follow-up diabetic visit. He has type 2 diabetes mellitus. There are no hypoglycemic associated symptoms. Pertinent negatives for diabetes include no blurred vision and no chest pain. There are no hypoglycemic complications. Risk factors for coronary artery disease include diabetes mellitus, dyslipidemia, hypertension and male sex. He is compliant with treatment some of the time. He is following a diabetic diet. He participates in exercise intermittently. An ACE inhibitor/angiotensin II receptor blocker is being taken.  Hypertension This is a chronic problem. The current episode started more than 1 year ago. The problem has been gradually improving since onset. The problem is controlled. Pertinent negatives include no blurred vision, chest pain, palpitations or shortness of breath. Risk factors for coronary artery disease include diabetes mellitus, dyslipidemia, male gender and sedentary lifestyle. There are no compliance problems.      Past Medical History:  Diagnosis Date  . Diabetes mellitus without complication ([place])   . High cholesterol   . History of prostate cancer   . HTN (hypertension)   . Renal insufficiency      Family History  Problem Relation Age of Onset  . Cancer Mother   . Cancer Father      Current Outpatient Medications:  .  albuterol (PROVENTIL HFA;VENTOLIN HFA) 108 (90 Base) MCG/ACT inhaler, Inhale 2 puffs into the lungs every 4 (four) hours as needed for wheezing or shortness of breath (cough, shortness of breath or wheezing.)., Disp: 1 Inhaler, Rfl: 1 .  amLODipine (NORVASC) 5 MG tablet, TAKE 1 TABLET BY MOUTH  TWICE DAILY, Disp: 180 tablet, Rfl: 3 .  apixaban (ELIQUIS) 2.5 MG TABS tablet, Take 1 tablet (2.5 mg total) by mouth 2 (two) times daily., Disp:  180 tablet, Rfl: 2 .  Cholecalciferol (VITAMIN D3) 50 MCG (2000 UT) TABS, Take 2,000 Units by mouth daily., Disp: , Rfl:  .  diltiazem (CARDIZEM) 30 MG tablet, Take 1 tablet (30 mg total) by mouth every 8 (eight) hours as needed. Palpitations, Disp: 60 tablet, Rfl: 2 .  doxazosin (CARDURA) 4 MG tablet, Take 1 tablet (4 mg total) by mouth at bedtime., Disp: 90 tablet, Rfl: 1 .  EASY TOUCH INSULIN SYRINGE 30G X 5/16" 0.5 ML MISC, USE 3 TIMES DAILY, Disp: 30 each, Rfl: 4 .  fexofenadine (ALLEGRA) 180 MG tablet, Take 180 mg by mouth daily as needed (allergies.). , Disp: , Rfl:  .  HUMALOG MIX 50/50 (50-50) 100 UNIT/ML SUSP injection, Inject 24 Units into the skin 2 (two) times daily., Disp: , Rfl:  .  Insulin Syringe-Needle U-100 (BD INSULIN SYRINGE U/F) 31G X 5/16" 0.5 ML MISC, USE TO INJECT INSULIN AS DIRECTED, Disp: , Rfl:  .  ketorolac (ACULAR) 0.5 % ophthalmic solution, Place 1 drop into both eyes 3 (three) times daily., Disp: , Rfl:  .  losartan-hydrochlorothiazide (HYZAAR) 100-25 MG tablet, , Disp: , Rfl:  .  metFORMIN (GLUCOPHAGE-XR) 500 MG 24 hr tablet, Take 500 mg by mouth 4 (four) times daily. , Disp: , Rfl:  .  metoprolol tartrate (LOPRESSOR) 25 MG tablet, Take 1 tablet (25 mg total) by mouth 2 (two) times daily., Disp: 180 tablet, Rfl: 1   Allergies  Allergen Reactions  . Statins Other (See Comments)    pain  Review of Systems  Constitutional: Negative.   Eyes: Negative for blurred vision.  Respiratory: Negative.  Negative for shortness of breath.   Cardiovascular: Negative.  Negative for chest pain and palpitations.  Gastrointestinal: Negative.   Neurological: Negative.   Psychiatric/Behavioral: Negative.      Today's Vitals   03/23/19 1106  BP: (!) 160/80  Pulse: 98  Temp: 97.8 F (36.6 C)  TempSrc: Oral  Weight: 238 lb 12.8 oz (108.3 kg)  Height: 6' 1.2" (1.859 m)  PainSc: 0-No pain   Body mass index is 31.33 kg/m.   Objective:  Physical Exam Vitals signs  and nursing note reviewed.  Constitutional:      Appearance: Normal appearance.  Cardiovascular:     Rate and Rhythm: Normal rate and regular rhythm.     Heart sounds: Normal heart sounds.  Pulmonary:     Effort: Pulmonary effort is normal.     Breath sounds: Normal breath sounds.  Skin:    General: Skin is warm.  Neurological:     General: No focal deficit present.     Mental Status: He is alert.  Psychiatric:        Mood and Affect: Mood normal.         Assessment And Plan:     1. Type 2 diabetes mellitus with other circulatory complications ([place])  I will check labs as listed below. His a1c is followed by Endocrinology.  These results were reviewed during his visit. He is encouraged to incorporate more exercise into his daily routine.   - Lipid panel - CMP14+EGFR  2. Hypertensive nephropathy  Chronic, uncontrolled. He reports normal bp readings at home - less than 130/90.  He does not wish to have any of his medications adjusted today. Patient was reminded of cardiovascular risks associated with uncontrolled high blood pressure.   3. Nocturia  Chronic. I will check PSA as requested by the patient.   - PSA, total and free  4. Class 1 obesity due to excess calories with serious comorbidity and body mass index (BMI) of 31.0 to 31.9 in adult  Chronic.  He is encouraged to strive for BMI less than 29 to decrease cardiac risk.   [name], MD    THE PATIENT IS ENCOURAGED TO PRACTICE SOCIAL DISTANCING DUE TO THE COVID-19 PANDEMIC.

## 2019-04-07 ENCOUNTER — Other Ambulatory Visit: Payer: Self-pay | Admitting: Internal Medicine

## 2019-04-10 ENCOUNTER — Ambulatory Visit: Payer: Medicare Other | Admitting: Internal Medicine

## 2019-04-12 ENCOUNTER — Other Ambulatory Visit: Payer: Self-pay | Admitting: Cardiology

## 2019-04-12 DIAGNOSIS — I4892 Unspecified atrial flutter: Secondary | ICD-10-CM

## 2019-04-19 ENCOUNTER — Other Ambulatory Visit: Payer: Self-pay

## 2019-04-19 ENCOUNTER — Ambulatory Visit: Payer: Medicare Other

## 2019-04-19 VITALS — BP 172/78 | HR 60 | Temp 98.0°F | Ht 73.2 in | Wt 238.8 lb

## 2019-04-19 DIAGNOSIS — I10 Essential (primary) hypertension: Secondary | ICD-10-CM

## 2019-04-19 MED ORDER — HYDRALAZINE HCL 25 MG PO TABS: 25.0000 mg | ORAL_TABLET | Freq: Two times a day (BID) | ORAL | 0 refills | Status: DC

## 2019-04-19 NOTE — Progress Notes (Signed)
Patient presented today for a nurse visit blood pressure check.

## 2019-04-29 ENCOUNTER — Other Ambulatory Visit: Payer: Self-pay

## 2019-04-29 ENCOUNTER — Emergency Department (HOSPITAL_COMMUNITY): Payer: Medicare Other

## 2019-04-29 ENCOUNTER — Inpatient Hospital Stay (HOSPITAL_COMMUNITY)
Admission: EM | Admit: 2019-04-29 | Discharge: 2019-05-02 | DRG: 309 | Disposition: A | Discharge status: Home / Self Care | Payer: Medicare Other | Attending: Internal Medicine | Admitting: Internal Medicine

## 2019-04-29 ENCOUNTER — Encounter (HOSPITAL_COMMUNITY): Payer: Self-pay | Admitting: *Deleted

## 2019-04-29 DIAGNOSIS — E1159 Type 2 diabetes mellitus with other circulatory complications: Secondary | ICD-10-CM | POA: Diagnosis present

## 2019-04-29 DIAGNOSIS — I444 Left anterior fascicular block: Secondary | ICD-10-CM | POA: Diagnosis not present

## 2019-04-29 DIAGNOSIS — Z79899 Other long term (current) drug therapy: Secondary | ICD-10-CM | POA: Diagnosis not present

## 2019-04-29 DIAGNOSIS — E785 Hyperlipidemia, unspecified: Secondary | ICD-10-CM | POA: Diagnosis not present

## 2019-04-29 DIAGNOSIS — E1122 Type 2 diabetes mellitus with diabetic chronic kidney disease: Secondary | ICD-10-CM | POA: Diagnosis not present

## 2019-04-29 DIAGNOSIS — Z8546 Personal history of malignant neoplasm of prostate: Secondary | ICD-10-CM

## 2019-04-29 DIAGNOSIS — I10 Essential (primary) hypertension: Secondary | ICD-10-CM | POA: Diagnosis not present

## 2019-04-29 DIAGNOSIS — I491 Atrial premature depolarization: Secondary | ICD-10-CM | POA: Diagnosis not present

## 2019-04-29 DIAGNOSIS — R001 Bradycardia, unspecified: Secondary | ICD-10-CM

## 2019-04-29 DIAGNOSIS — E7800 Pure hypercholesterolemia, unspecified: Secondary | ICD-10-CM | POA: Diagnosis present

## 2019-04-29 DIAGNOSIS — I4430 Unspecified atrioventricular block: Secondary | ICD-10-CM | POA: Diagnosis not present

## 2019-04-29 DIAGNOSIS — Z794 Long term (current) use of insulin: Secondary | ICD-10-CM

## 2019-04-29 DIAGNOSIS — R55 Syncope and collapse: Secondary | ICD-10-CM

## 2019-04-29 DIAGNOSIS — Z20828 Contact with and (suspected) exposure to other viral communicable diseases: Secondary | ICD-10-CM | POA: Diagnosis present

## 2019-04-29 DIAGNOSIS — I4892 Unspecified atrial flutter: Secondary | ICD-10-CM | POA: Diagnosis not present

## 2019-04-29 DIAGNOSIS — Z6830 Body mass index (BMI) 30.0-30.9, adult: Secondary | ICD-10-CM

## 2019-04-29 DIAGNOSIS — S0990XA Unspecified injury of head, initial encounter: Secondary | ICD-10-CM | POA: Diagnosis not present

## 2019-04-29 DIAGNOSIS — I480 Paroxysmal atrial fibrillation: Secondary | ICD-10-CM | POA: Diagnosis not present

## 2019-04-29 DIAGNOSIS — I4510 Unspecified right bundle-branch block: Secondary | ICD-10-CM | POA: Diagnosis not present

## 2019-04-29 DIAGNOSIS — I129 Hypertensive chronic kidney disease with stage 1 through stage 4 chronic kidney disease, or unspecified chronic kidney disease: Secondary | ICD-10-CM | POA: Diagnosis not present

## 2019-04-29 DIAGNOSIS — Z23 Encounter for immunization: Secondary | ICD-10-CM | POA: Diagnosis not present

## 2019-04-29 DIAGNOSIS — Z888 Allergy status to other drugs, medicaments and biological substances status: Secondary | ICD-10-CM | POA: Diagnosis not present

## 2019-04-29 DIAGNOSIS — N183 Chronic kidney disease, stage 3 (moderate): Secondary | ICD-10-CM | POA: Diagnosis not present

## 2019-04-29 DIAGNOSIS — R569 Unspecified convulsions: Secondary | ICD-10-CM | POA: Diagnosis not present

## 2019-04-29 DIAGNOSIS — Z8673 Personal history of transient ischemic attack (TIA), and cerebral infarction without residual deficits: Secondary | ICD-10-CM | POA: Diagnosis not present

## 2019-04-29 DIAGNOSIS — I495 Sick sinus syndrome: Secondary | ICD-10-CM | POA: Diagnosis not present

## 2019-04-29 DIAGNOSIS — E669 Obesity, unspecified: Secondary | ICD-10-CM | POA: Diagnosis present

## 2019-04-29 DIAGNOSIS — I484 Atypical atrial flutter: Secondary | ICD-10-CM | POA: Diagnosis not present

## 2019-04-29 DIAGNOSIS — G459 Transient cerebral ischemic attack, unspecified: Secondary | ICD-10-CM | POA: Diagnosis present

## 2019-04-29 DIAGNOSIS — Z7901 Long term (current) use of anticoagulants: Secondary | ICD-10-CM | POA: Diagnosis not present

## 2019-04-29 DIAGNOSIS — I4891 Unspecified atrial fibrillation: Secondary | ICD-10-CM | POA: Diagnosis not present

## 2019-04-29 DIAGNOSIS — I441 Atrioventricular block, second degree: Secondary | ICD-10-CM | POA: Diagnosis not present

## 2019-04-29 DIAGNOSIS — I471 Supraventricular tachycardia: Secondary | ICD-10-CM | POA: Diagnosis not present

## 2019-04-29 LAB — TROPONIN I (HIGH SENSITIVITY)
Troponin I (High Sensitivity): 11 ng/L (ref <18)
Troponin I (High Sensitivity): 15 ng/L (ref <18)

## 2019-04-29 LAB — CBC WITH DIFFERENTIAL/PLATELET
Abs Immature Granulocytes: 0.02 10*3/uL (ref 0.00–0.07)
Basophils Absolute: 0 10*3/uL (ref 0.0–0.1)
Basophils Relative: 0 %
Eosinophils Absolute: 0 10*3/uL (ref 0.0–0.5)
Eosinophils Relative: 0 %
HCT: 40.4 % (ref 39.0–52.0)
Hemoglobin: 13.1 g/dL (ref 13.0–17.0)
Immature Granulocytes: 0 %
Lymphocytes Relative: 17 %
Lymphs Abs: 1 10*3/uL (ref 0.7–4.0)
MCH: 27.6 pg (ref 26.0–34.0)
MCHC: 32.4 g/dL (ref 30.0–36.0)
MCV: 85.1 fL (ref 80.0–100.0)
Monocytes Absolute: 0.5 10*3/uL (ref 0.1–1.0)
Monocytes Relative: 8 %
Neutro Abs: 4.4 10*3/uL (ref 1.7–7.7)
Neutrophils Relative %: 75 %
Platelets: 184 10*3/uL (ref 150–400)
RBC: 4.75 MIL/uL (ref 4.22–5.81)
RDW: 15.4 % (ref 11.5–15.5)
WBC: 6 10*3/uL (ref 4.0–10.5)
nRBC: 0 % (ref 0.0–0.2)

## 2019-04-29 LAB — LACTIC ACID, PLASMA: Lactic Acid, Venous: 2.3 mmol/L (ref 0.5–1.9)

## 2019-04-29 LAB — BASIC METABOLIC PANEL
Anion gap: 12 (ref 5–15)
BUN: 25 mg/dL — ABNORMAL HIGH (ref 8–23)
CO2: 18 mmol/L — ABNORMAL LOW (ref 22–32)
Calcium: 8.9 mg/dL (ref 8.9–10.3)
Chloride: 104 mmol/L (ref 98–111)
Creatinine, Ser: 1.55 mg/dL — ABNORMAL HIGH (ref 0.61–1.24)
GFR calc Af Amer: 46 mL/min — ABNORMAL LOW (ref >60)
GFR calc non Af Amer: 40 mL/min — ABNORMAL LOW (ref >60)
Glucose, Bld: 240 mg/dL — ABNORMAL HIGH (ref 70–99)
Potassium: 4.1 mmol/L (ref 3.5–5.1)
Sodium: 134 mmol/L — ABNORMAL LOW (ref 135–145)

## 2019-04-29 MED ORDER — SODIUM CHLORIDE 0.9 % IV BOLUS
1000.0000 mL | Freq: Once | INTRAVENOUS | Status: AC
  Administered 2019-04-29: 1000 mL via INTRAVENOUS

## 2019-04-29 NOTE — ED Triage Notes (Signed)
Pt from home. Around 1630, pt fell to the ground after getting out of car; pt reports back pain from fall. Wife reported to EMS that pt was sitting at the table and had two seizure like episodes an hour apart, lasting 15 seconds. Denies hx of seizures. Started Eliquis several weeks ago for afib. Pt A&Ox4  On arrival

## 2019-04-29 NOTE — ED Notes (Signed)
Patient transported to CT 

## 2019-04-29 NOTE — ED Provider Notes (Signed)
[facility] EMERGENCY DEPARTMENT Provider Note   CSN: [id number] Arrival date & time: 04/29/19  2043     History   Chief Complaint Chief Complaint  Patient presents with  . Near Syncope    HPI Marcus Johnston is a 85 y.o. male.     HPI   85 year old male coming in for evaluation change in mental status and possible seizure-like activity.  This afternoon patient was getting out of a car when he fell.  Did strike his head.  His wife heard him fall but it did not actually see what happened.  She saw him right away and he was conscious.  He was able to get himself up and back into the house.  Couple hours later he was in a seated position when he had about 5-second episode of generalized shaking and was unresponsive.  He was speaking quickly after this activity abated but seem confused.  He is asking what happened.  There is no incontinence or tongue biting.  Approximately 1 hour later he was again in a seated position when he had generalized shaking which he estimates lasted about 10 seconds.  Briefly seem confused afterwards.  Patient does not remember these events.  Denies any preceding symptoms.  He is not sure why he fell.  Is complaining of mild headache. Denies pain elsewhere.  He is on Eliquis for history of atrial fibrillation.  He feels like he has been otherwise in his normal state of health over the past several days.  Past Medical History:  Diagnosis Date  . Diabetes mellitus without complication ([place])   . High cholesterol   . History of prostate cancer   . HTN (hypertension)   . Renal insufficiency     Patient Active Problem List   Diagnosis Date Noted  . Paroxysmal atrial flutter ([place]) 10/17/2018  . Leg edema 10/17/2018  . Type 2 diabetes mellitus with other circulatory complications ([place]) 0000000  . Hyperglycemia   . TIA (transient ischemic attack) 11/12/2016  . Essential hypertension 11/12/2016  . DM (diabetes mellitus) ([place]) 11/12/2016  .  Renal insufficiency 11/12/2016    Past Surgical History:  Procedure Laterality Date  . prostate implant          Home Medications    Prior to Admission medications   Medication Sig Start Date End Date Taking? Authorizing Provider  albuterol (PROVENTIL HFA;VENTOLIN HFA) 108 (90 Base) MCG/ACT inhaler Inhale 2 puffs into the lungs every 4 (four) hours as needed for wheezing or shortness of breath (cough, shortness of breath or wheezing.). 08/04/18   Robyn Haber, MD  amLODipine (NORVASC) 5 MG tablet TAKE 1 TABLET BY MOUTH  TWICE DAILY 03/27/19   Patwardhan, Reynold Bowen, MD  apixaban (ELIQUIS) 2.5 MG TABS tablet Take 1 tablet (2.5 mg total) by mouth 2 (two) times daily. 11/14/18   Patwardhan, Reynold Bowen, MD  Cholecalciferol (VITAMIN D3) 50 MCG (2000 UT) TABS Take 2,000 Units by mouth daily.    [provider]  diltiazem (CARDIZEM) 30 MG tablet Take 1 tablet (30 mg total) by mouth every 8 (eight) hours as needed. Palpitations 11/07/18   Patwardhan, Reynold Bowen, MD  doxazosin (CARDURA) 4 MG tablet Take 1 tablet (4 mg total) by mouth at bedtime. 11/14/18   Glendale Chard, MD  EASY [facility] INSULIN SYRINGE 30G X 5/16" 0.5 ML MISC USE 3 TIMES DAILY 11/07/18   Glendale Chard, MD  fexofenadine (ALLEGRA) 180 MG tablet Take 180 mg by mouth daily as needed (allergies.).  [provider]  HUMALOG MIX 50/50 (50-50) 100 UNIT/ML SUSP injection Inject 24 Units into the skin 2 (two) times daily. 08/17/18   [provider]  hydrALAZINE (APRESOLINE) 25 MG tablet Take 1 tablet (25 mg total) by mouth 2 (two) times daily. 04/19/19 04/18/20  Glendale Chard, MD  Insulin Syringe-Needle U-100 (BD INSULIN SYRINGE U/F) 31G X 5/16" 0.5 ML MISC USE TO INJECT INSULIN AS DIRECTED 04/03/17   [provider]  ketorolac (ACULAR) 0.5 % ophthalmic solution Place 1 drop into both eyes 3 (three) times daily. 08/17/18   [provider]  losartan-hydrochlorothiazide (HYZAAR) 100-25 MG tablet TAKE 1 TABLET  BY MOUTH  DAILY 04/10/19   Glendale Chard, MD  metFORMIN (GLUCOPHAGE-XR) 500 MG 24 hr tablet Take 500 mg by mouth 4 (four) times daily.     [provider]  metoprolol tartrate (LOPRESSOR) 25 MG tablet TAKE 1 TABLET BY MOUTH  TWICE DAILY 04/13/19   Adrian Prows, MD    Family History Family History  Problem Relation Age of Onset  . Cancer Mother   . Cancer Father     Social History Social History   Tobacco Use  . Smoking status: Never Smoker  . Smokeless tobacco: Never Used  Substance Use Topics  . Alcohol use: No  . Drug use: No     Allergies   Statins   Review of Systems Review of Systems  All systems reviewed and negative, other than as noted in HPI.  Physical Exam Updated Vital Signs BP (!) 175/70   Pulse (!) 47   Temp 97.8 F (36.6 C) (Oral)   Resp 12   SpO2 98%   Physical Exam Vitals signs and nursing note reviewed.  Constitutional:      General: He is not in acute distress.    Appearance: He is well-developed.  HENT:     Head: Normocephalic and atraumatic.     Comments: No scalp tenderness or hematoma.  No midline spinal tenderness. Eyes:     General:        Right eye: No discharge.        Left eye: No discharge.     Conjunctiva/sclera: Conjunctivae normal.  Neck:     Musculoskeletal: Neck supple.  Cardiovascular:     Rate and Rhythm: Bradycardia present.     Heart sounds: Normal heart sounds. No murmur. No friction rub. No gallop.      Comments: Irregularly irregular Pulmonary:     Effort: Pulmonary effort is normal. No respiratory distress.     Breath sounds: Normal breath sounds.  Abdominal:     General: There is no distension.     Palpations: Abdomen is soft.     Tenderness: There is no abdominal tenderness.  Musculoskeletal:        General: Tenderness present.     Right lower leg: Edema present.     Left lower leg: Edema present.  Skin:    General: Skin is warm and dry.  Neurological:     Mental Status: He is alert and  oriented to person, place, and time.     Comments: Speech clear.  Content appropriate.  Follow commands.  Cranial nerves II through XII intact.  Strength is 5 out of 5 bilateral upper and lower extremities.  Sensation is intact light touch.  Psychiatric:        Behavior: Behavior normal.        Thought Content: Thought content normal.      ED Treatments /  Results  Labs (all labs ordered are listed, but only abnormal results are displayed) Labs Reviewed  BASIC METABOLIC PANEL - Abnormal; Notable for the following components:      Result Value   Sodium 134 (*)    CO2 18 (*)    Glucose, Bld 240 (*)    BUN 25 (*)    Creatinine, Ser 1.55 (*)    GFR calc non Af Amer 40 (*)    GFR calc Af Amer 46 (*)    All other components within normal limits  SARS CORONAVIRUS 2 (TAT 6-24 HRS)  CBC WITH DIFFERENTIAL/PLATELET  LACTIC ACID, PLASMA  LACTIC ACID, PLASMA  TROPONIN I (HIGH SENSITIVITY)  TROPONIN I (HIGH SENSITIVITY)    EKG EKG Interpretation  Date/Time:  Saturday April 29 2019 21:00:20 EDT Ventricular Rate:  109 PR Interval:    QRS Duration: 93 QT Interval:  372 QTC Calculation: 440 R Axis:   -5 Text Interpretation:  Sinus or ectopic atrial tachycardia Ventricular bigeminy Borderline prolonged PR interval Nonspecific T abnormalities, inferior leads Confirmed by Virgel Manifold [phone]) on 04/29/2019 10:18:26 PM   Radiology Ct Head Wo Contrast  Result Date: 04/29/2019 CLINICAL DATA:  Head trauma, minor, pt on anticoagulation EXAM: CT HEAD WITHOUT CONTRAST TECHNIQUE: Contiguous axial images were obtained from the base of the skull through the vertex without intravenous contrast. COMPARISON:  Head CT and brain MRI March 2018 FINDINGS: Brain: No intracranial hemorrhage, mass effect, or midline shift. Unchanged degree of atrophy and chronic small vessel ischemia from prior exam. No hydrocephalus. The basilar cisterns are patent. No evidence of territorial infarct or acute ischemia. No  extra-axial or intracranial fluid collection. Vascular: No hyperdense vessel. Skull: No fracture or focal lesion. Sinuses/Orbits: Paranasal sinuses and mastoid air cells are clear. The visualized orbits are unremarkable. Bilateral cataract resection. Other: Mild right frontal scalp scarring. IMPRESSION: 1.  No acute intracranial abnormality. 2. Stable degree of atrophy and chronic small vessel ischemia. Electronically Signed   By: Keith Rake M.D.   On: 04/29/2019 22:48    Procedures Procedures (including critical care time)  Medications Ordered in ED Medications  sodium chloride 0.9 % bolus 1,000 mL (1,000 mLs Intravenous New Bag/Given 04/29/19 2257)     Initial Impression / Assessment and Plan / ED Course  I have reviewed the triage vital signs and the nursing notes.  Pertinent labs & imaging results that were available during my care of the patient were reviewed by me and considered in my medical decision making (see chart for details).  85 year old male with a fall earlier today then 2 additional episodes of shaking and unresponsiveness.  Consider syncope and syncopal convulsions versus possible seizure.  Currently his neuro exam is nonfocal.  Complaining of a minimal headache.  EKG showing A. fib which he has a history of.  CT head w/o acute abnor  I think he needs admitted for observation on telemetry given the fact that he had a fall of unclear circumstances and then two additional events.   Final Clinical Impressions(s) / ED Diagnoses   Final diagnoses:  Syncope and collapse    ED Discharge Orders    None       Virgel Manifold, MD 05/02/19 0830

## 2019-04-29 NOTE — H&P (Signed)
History and Physical   WARWICK ROSELLO [id number] DOB: 1931-06-24 DOA: 04/29/2019  Referring MD/NP/PA: Dr. Wilson Singer  PCP: Glendale Chard, MD   Outpatient Specialists: Dr. Christen Butter  Patient coming from: Home  Chief Complaint: Passing out  HPI: Marcus Johnston is a 85 y.o. male with medical history significant of atrial fibrillation, diabetes, hypertension, history of TIA, hyperlipidemia, chronic kidney disease stage III, history of prostate cancer who was apparently driving with his wife when he started having slight altered mental status.  Patient was noted by the wife to fall out of his the car when she stopped and he was getting out.  He did strike his head when he fell.  She had no idea how he fell.  Later on he had another episode after regaining full consciousness.  This time he was in a seated position when he started having generalized shaking for about 5 seconds.  At that time he was unresponsive when he came back he asked what happened.  About an hour later he had another episode this time around lasted about 10 seconds.  Wife did notice these 2 episodes.  No loss of bladder or bowel control.  Patient denied any premonition prior to episodes.  Denied any palpitations.  No recent diarrhea nausea or vomiting.  Patient has atrial fibrillation on full anticoagulation.  In the ER he is noted to also be persistently bradycardic in the 40s.  He is on beta-blockers at home.  Patient is being admitted with diagnosis of syncope versus seizure activity.  He also had prior history of TIA so suspected TIA again.  He is being admitted for work-up.  ED Course: Temperature is 97.5 blood pressure 194/76 pulse 45 respiratory rate of 16 oxygen sat 97% on room air.  Sodium 134 potassium 4.0 chloride 104 CO2 of 18 with glucose 240 BUN 25 creatinine 1.55.  Lactic acid 2.3.  CBC is within normal.  Troponin initially 11 and then 15.  COVID-19 is negative.  Head CT without contrast negative.  Patient is being  admitted for work-up of syncope versus seizure. Review of Systems: As per HPI otherwise 10 point review of systems negative.    Past Medical History:  Diagnosis Date  . Diabetes mellitus without complication ([place])   . High cholesterol   . History of prostate cancer   . HTN (hypertension)   . Renal insufficiency     Past Surgical History:  Procedure Laterality Date  . prostate implant       reports that he has never smoked. He has never used smokeless tobacco. He reports that he does not drink alcohol or use drugs.  Allergies  Allergen Reactions  . Statins Other (See Comments)    pain    Family History  Problem Relation Age of Onset  . Cancer Mother   . Cancer Father      Prior to Admission medications   Medication Sig Start Date End Date Taking? Authorizing Provider  albuterol (PROVENTIL HFA;VENTOLIN HFA) 108 (90 Base) MCG/ACT inhaler Inhale 2 puffs into the lungs every 4 (four) hours as needed for wheezing or shortness of breath (cough, shortness of breath or wheezing.). 08/04/18   Robyn Haber, MD  amLODipine (NORVASC) 5 MG tablet TAKE 1 TABLET BY MOUTH  TWICE DAILY 03/27/19   Patwardhan, Reynold Bowen, MD  apixaban (ELIQUIS) 2.5 MG TABS tablet Take 1 tablet (2.5 mg total) by mouth 2 (two) times daily. 11/14/18   Patwardhan, Reynold Bowen, MD  Cholecalciferol (VITAMIN D3) 50  MCG (2000 UT) TABS Take 2,000 Units by mouth daily.    [provider]  diltiazem (CARDIZEM) 30 MG tablet Take 1 tablet (30 mg total) by mouth every 8 (eight) hours as needed. Palpitations 11/07/18   Patwardhan, Reynold Bowen, MD  doxazosin (CARDURA) 4 MG tablet Take 1 tablet (4 mg total) by mouth at bedtime. 11/14/18   Glendale Chard, MD  EASY [facility] INSULIN SYRINGE 30G X 5/16" 0.5 ML MISC USE 3 TIMES DAILY 11/07/18   Glendale Chard, MD  fexofenadine (ALLEGRA) 180 MG tablet Take 180 mg by mouth daily as needed (allergies.).     [provider]  HUMALOG MIX 50/50 (50-50) 100 UNIT/ML SUSP injection  Inject 24 Units into the skin 2 (two) times daily. 08/17/18   [provider]  hydrALAZINE (APRESOLINE) 25 MG tablet Take 1 tablet (25 mg total) by mouth 2 (two) times daily. 04/19/19 04/18/20  Glendale Chard, MD  Insulin Syringe-Needle U-100 (BD INSULIN SYRINGE U/F) 31G X 5/16" 0.5 ML MISC USE TO INJECT INSULIN AS DIRECTED 04/03/17   [provider]  ketorolac (ACULAR) 0.5 % ophthalmic solution Place 1 drop into both eyes 3 (three) times daily. 08/17/18   [provider]  losartan-hydrochlorothiazide (HYZAAR) 100-25 MG tablet TAKE 1 TABLET BY MOUTH  DAILY 04/10/19   Glendale Chard, MD  metFORMIN (GLUCOPHAGE-XR) 500 MG 24 hr tablet Take 500 mg by mouth 4 (four) times daily.     [provider]  metoprolol tartrate (LOPRESSOR) 25 MG tablet TAKE 1 TABLET BY MOUTH  TWICE DAILY 04/13/19   Adrian Prows, MD    Physical Exam: Vitals:   04/29/19 2039 04/29/19 2041 04/29/19 2115 04/29/19 2200  BP:  (!) 172/72 (!) 182/75 (!) 175/70  Pulse:  (!) 50 (!) 50 (!) 47  Resp:  16 14 12   Temp:  97.8 F (36.6 C)    TempSrc:  Oral    SpO2: 100% 100% 98% 98%      Constitutional: NAD, calm, comfortable Vitals:   04/29/19 2039 04/29/19 2041 04/29/19 2115 04/29/19 2200  BP:  (!) 172/72 (!) 182/75 (!) 175/70  Pulse:  (!) 50 (!) 50 (!) 47  Resp:  16 14 12   Temp:  97.8 F (36.6 C)    TempSrc:  Oral    SpO2: 100% 100% 98% 98%   Eyes: PERRL, lids and conjunctivae normal ENMT: Mucous membranes are moist. Posterior pharynx clear of any exudate or lesions.Normal dentition.  Neck: normal, supple, no masses, no thyromegaly Respiratory: clear to auscultation bilaterally, no wheezing, no crackles. Normal respiratory effort. No accessory muscle use.  Cardiovascular: Sinus bradycardia no murmurs / rubs / gallops. No extremity edema. 2+ pedal pulses. No carotid bruits.  Abdomen: no tenderness, no masses palpated. No hepatosplenomegaly. Bowel sounds positive.  Musculoskeletal: no clubbing /  cyanosis. No joint deformity upper and lower extremities. Good ROM, no contractures. Normal muscle tone.  Skin: no rashes, lesions, ulcers. No induration Neurologic: CN 2-12 grossly intact. Sensation intact, DTR normal. Strength 5/5 in all 4.  Psychiatric: Normal judgment and insight. Alert and oriented x 3. Normal mood.     Labs on Admission: I have personally reviewed following labs and imaging studies  CBC: Recent Labs  Lab 04/29/19 2111  WBC 6.0  NEUTROABS 4.4  HGB 13.1  HCT 40.4  MCV 85.1  PLT [id number]   Basic Metabolic Panel: Recent Labs  Lab 04/29/19 2111  NA 134*  K 4.1  CL 104  CO2 18*  GLUCOSE 240*  BUN 25*  CREATININE 1.55*  CALCIUM 8.9   GFR: Estimated Creatinine Clearance: 44.3 mL/min (A) (by C-G formula based on SCr of 1.55 mg/dL (H)). Liver Function Tests: No results for input(s): AST, ALT, ALKPHOS, BILITOT, PROT, ALBUMIN in the last 168 hours. No results for input(s): LIPASE, AMYLASE in the last 168 hours. No results for input(s): AMMONIA in the last 168 hours. Coagulation Profile: No results for input(s): INR, PROTIME in the last 168 hours. Cardiac Enzymes: No results for input(s): CKTOTAL, CKMB, CKMBINDEX, TROPONINI in the last 168 hours. BNP (last 3 results) No results for input(s): PROBNP in the last 8760 hours. HbA1C: No results for input(s): HGBA1C in the last 72 hours. CBG: No results for input(s): GLUCAP in the last 168 hours. Lipid Profile: No results for input(s): CHOL, HDL, LDLCALC, TRIG, CHOLHDL, LDLDIRECT in the last 72 hours. Thyroid Function Tests: No results for input(s): TSH, T4TOTAL, FREET4, T3FREE, THYROIDAB in the last 72 hours. Anemia Panel: No results for input(s): VITAMINB12, FOLATE, FERRITIN, TIBC, IRON, RETICCTPCT in the last 72 hours. Urine analysis:    Component Value Date/Time   BILIRUBINUR negative 03/23/2019 1639   PROTEINUR Positive (A) 03/23/2019 1639   UROBILINOGEN 0.2 03/23/2019 1639   NITRITE negative  03/23/2019 1639   LEUKOCYTESUR Negative 03/23/2019 1639   Sepsis Labs: @LABRCNTIP (procalcitonin:4,lacticidven:4) )No results found for this or any previous visit (from the past 240 hour(s)).   Radiological Exams on Admission: Ct Head Wo Contrast  Result Date: 04/29/2019 CLINICAL DATA:  Head trauma, minor, pt on anticoagulation EXAM: CT HEAD WITHOUT CONTRAST TECHNIQUE: Contiguous axial images were obtained from the base of the skull through the vertex without intravenous contrast. COMPARISON:  Head CT and brain MRI March 2018 FINDINGS: Brain: No intracranial hemorrhage, mass effect, or midline shift. Unchanged degree of atrophy and chronic small vessel ischemia from prior exam. No hydrocephalus. The basilar cisterns are patent. No evidence of territorial infarct or acute ischemia. No extra-axial or intracranial fluid collection. Vascular: No hyperdense vessel. Skull: No fracture or focal lesion. Sinuses/Orbits: Paranasal sinuses and mastoid air cells are clear. The visualized orbits are unremarkable. Bilateral cataract resection. Other: Mild right frontal scalp scarring. IMPRESSION: 1.  No acute intracranial abnormality. 2. Stable degree of atrophy and chronic small vessel ischemia. Electronically Signed   By: Keith Rake M.D.   On: 04/29/2019 22:48    EKG: Independently reviewed.  It shows sinus tachycardia with rate of 109 initially.  Ventricular bigeminy also noted prolonged PR interval with nonspecific ST changes throughout.  Assessment/Plan Principal Problem:   Syncope and collapse Active Problems:   history of TIA (transient ischemic attack)   Essential hypertension   Type 2 diabetes mellitus with other circulatory complications (HCC)   Paroxysmal atrial flutter ([place])     #1 syncope versus seizure: Patient will be admitted to telemetry.  Will get echocardiogram.  Cycle enzymes.  MRI of the brain to rule out TIA.  Seizure precaution.  Monitor closely.  #2 sinus bradycardia:  Patient initially had tachycardia and now bradycardia.  Not sure if he is having tachybradycardia syndrome as a result of nodal disease.  Will monitor on telemetry.  Will likely need cardiology consult prior to discharge.  #3 diabetes: Continue home regimen with sliding scale insulin.  #4 hypertension: Resume blood pressure medications except rate lowering medications like Lopressor  #5 history of TIA: Work-up will include MRI of the brain to rule out another CVA.  PT and OT consulted   DVT prophylaxis: Eliquis Code Status:  Full code Family Communication: Wife at Disposition Plan: Home Consults called: Patient may require cardiac consult in the morning Admission status: Observation  Severity of Illness: The appropriate patient status for this patient is OBSERVATION. Observation status is judged to be reasonable and necessary in order to provide the required intensity of service to ensure the patient's safety. The patient's presenting symptoms, physical exam findings, and initial radiographic and laboratory data in the context of their medical condition is felt to place them at decreased risk for further clinical deterioration. Furthermore, it is anticipated that the patient will be medically stable for discharge from the hospital within 2 midnights of admission. The following factors support the patient status of observation.   " The patient's presenting symptoms include syncope versus seizure. " The physical exam findings include no significant findings on exam except for bradycardia " The initial radiographic and laboratory data are mildly elevated troponin.     Barbette Merino MD [facility] Pager [phone]  If 7PM-7AM, please contact night-coverage [email or web address] Password [facility]  04/29/2019, 11:26 PM

## 2019-04-30 ENCOUNTER — Ambulatory Visit (HOSPITAL_COMMUNITY): Payer: Medicare Other

## 2019-04-30 ENCOUNTER — Other Ambulatory Visit: Payer: Self-pay

## 2019-04-30 ENCOUNTER — Encounter (HOSPITAL_COMMUNITY): Payer: Self-pay | Admitting: Cardiology

## 2019-04-30 ENCOUNTER — Observation Stay (HOSPITAL_COMMUNITY): Payer: Medicare Other

## 2019-04-30 DIAGNOSIS — I10 Essential (primary) hypertension: Secondary | ICD-10-CM | POA: Diagnosis not present

## 2019-04-30 DIAGNOSIS — I484 Atypical atrial flutter: Secondary | ICD-10-CM | POA: Diagnosis not present

## 2019-04-30 DIAGNOSIS — Z6830 Body mass index (BMI) 30.0-30.9, adult: Secondary | ICD-10-CM | POA: Diagnosis not present

## 2019-04-30 DIAGNOSIS — Z8546 Personal history of malignant neoplasm of prostate: Secondary | ICD-10-CM | POA: Diagnosis not present

## 2019-04-30 DIAGNOSIS — Z79899 Other long term (current) drug therapy: Secondary | ICD-10-CM | POA: Diagnosis not present

## 2019-04-30 DIAGNOSIS — R001 Bradycardia, unspecified: Secondary | ICD-10-CM | POA: Diagnosis not present

## 2019-04-30 DIAGNOSIS — N183 Chronic kidney disease, stage 3 (moderate): Secondary | ICD-10-CM | POA: Diagnosis present

## 2019-04-30 DIAGNOSIS — E669 Obesity, unspecified: Secondary | ICD-10-CM | POA: Diagnosis present

## 2019-04-30 DIAGNOSIS — I471 Supraventricular tachycardia: Secondary | ICD-10-CM | POA: Diagnosis not present

## 2019-04-30 DIAGNOSIS — Z888 Allergy status to other drugs, medicaments and biological substances status: Secondary | ICD-10-CM | POA: Diagnosis not present

## 2019-04-30 DIAGNOSIS — E1159 Type 2 diabetes mellitus with other circulatory complications: Secondary | ICD-10-CM | POA: Diagnosis present

## 2019-04-30 DIAGNOSIS — E1122 Type 2 diabetes mellitus with diabetic chronic kidney disease: Secondary | ICD-10-CM | POA: Diagnosis present

## 2019-04-30 DIAGNOSIS — I444 Left anterior fascicular block: Secondary | ICD-10-CM | POA: Diagnosis present

## 2019-04-30 DIAGNOSIS — E785 Hyperlipidemia, unspecified: Secondary | ICD-10-CM | POA: Diagnosis present

## 2019-04-30 DIAGNOSIS — I495 Sick sinus syndrome: Secondary | ICD-10-CM

## 2019-04-30 DIAGNOSIS — Z20828 Contact with and (suspected) exposure to other viral communicable diseases: Secondary | ICD-10-CM | POA: Diagnosis present

## 2019-04-30 DIAGNOSIS — I4892 Unspecified atrial flutter: Secondary | ICD-10-CM | POA: Diagnosis not present

## 2019-04-30 DIAGNOSIS — I480 Paroxysmal atrial fibrillation: Secondary | ICD-10-CM | POA: Diagnosis not present

## 2019-04-30 DIAGNOSIS — Z794 Long term (current) use of insulin: Secondary | ICD-10-CM | POA: Diagnosis not present

## 2019-04-30 DIAGNOSIS — I129 Hypertensive chronic kidney disease with stage 1 through stage 4 chronic kidney disease, or unspecified chronic kidney disease: Secondary | ICD-10-CM | POA: Diagnosis present

## 2019-04-30 DIAGNOSIS — Z7901 Long term (current) use of anticoagulants: Secondary | ICD-10-CM | POA: Diagnosis not present

## 2019-04-30 DIAGNOSIS — R55 Syncope and collapse: Secondary | ICD-10-CM | POA: Diagnosis present

## 2019-04-30 DIAGNOSIS — E7800 Pure hypercholesterolemia, unspecified: Secondary | ICD-10-CM | POA: Diagnosis present

## 2019-04-30 DIAGNOSIS — I4510 Unspecified right bundle-branch block: Secondary | ICD-10-CM | POA: Diagnosis present

## 2019-04-30 DIAGNOSIS — Z23 Encounter for immunization: Secondary | ICD-10-CM | POA: Diagnosis not present

## 2019-04-30 DIAGNOSIS — Z8673 Personal history of transient ischemic attack (TIA), and cerebral infarction without residual deficits: Secondary | ICD-10-CM | POA: Diagnosis not present

## 2019-04-30 LAB — COMPREHENSIVE METABOLIC PANEL
ALT: 25 U/L (ref 0–44)
AST: 22 U/L (ref 15–41)
Albumin: 3.2 g/dL — ABNORMAL LOW (ref 3.5–5.0)
Alkaline Phosphatase: 67 U/L (ref 38–126)
Anion gap: 9 (ref 5–15)
BUN: 21 mg/dL (ref 8–23)
CO2: 22 mmol/L (ref 22–32)
Calcium: 8.9 mg/dL (ref 8.9–10.3)
Chloride: 107 mmol/L (ref 98–111)
Creatinine, Ser: 1.43 mg/dL — ABNORMAL HIGH (ref 0.61–1.24)
GFR calc Af Amer: 51 mL/min — ABNORMAL LOW (ref >60)
GFR calc non Af Amer: 44 mL/min — ABNORMAL LOW (ref >60)
Glucose, Bld: 174 mg/dL — ABNORMAL HIGH (ref 70–99)
Potassium: 3.7 mmol/L (ref 3.5–5.1)
Sodium: 138 mmol/L (ref 135–145)
Total Bilirubin: 0.9 mg/dL (ref 0.3–1.2)
Total Protein: 6 g/dL — ABNORMAL LOW (ref 6.5–8.1)

## 2019-04-30 LAB — SURGICAL PCR SCREEN
MRSA, PCR: NEGATIVE
Staphylococcus aureus: NEGATIVE

## 2019-04-30 LAB — GLUCOSE, CAPILLARY
Glucose-Capillary: 164 mg/dL — ABNORMAL HIGH (ref 70–99)
Glucose-Capillary: 176 mg/dL — ABNORMAL HIGH (ref 70–99)

## 2019-04-30 LAB — TSH: TSH: 2.437 u[IU]/mL (ref 0.350–4.500)

## 2019-04-30 LAB — HEPARIN LEVEL (UNFRACTIONATED): Heparin Unfractionated: 0.79 IU/mL — ABNORMAL HIGH (ref 0.30–0.70)

## 2019-04-30 LAB — LACTIC ACID, PLASMA: Lactic Acid, Venous: 1.4 mmol/L (ref 0.5–1.9)

## 2019-04-30 LAB — SARS CORONAVIRUS 2 (TAT 6-24 HRS): SARS Coronavirus 2: NEGATIVE

## 2019-04-30 LAB — APTT: aPTT: 35 seconds (ref 24–36)

## 2019-04-30 MED ORDER — HYDRALAZINE HCL 25 MG PO TABS
25.0000 mg | ORAL_TABLET | Freq: Two times a day (BID) | ORAL | Status: DC
  Administered 2019-04-30 – 2019-05-02 (×6): 25 mg via ORAL
  Filled 2019-04-30 (×6): qty 1

## 2019-04-30 MED ORDER — SODIUM CHLORIDE 0.9 % IV SOLN
INTRAVENOUS | Status: DC
  Administered 2019-04-30: 04:00:00 via INTRAVENOUS

## 2019-04-30 MED ORDER — CHLORHEXIDINE GLUCONATE CLOTH 2 % EX PADS
6.0000 | MEDICATED_PAD | Freq: Every day | CUTANEOUS | Status: DC
  Administered 2019-04-30 – 2019-05-01 (×2): 6 via TOPICAL

## 2019-04-30 MED ORDER — ACETAMINOPHEN 325 MG PO TABS
650.0000 mg | ORAL_TABLET | Freq: Four times a day (QID) | ORAL | Status: DC | PRN
  Administered 2019-04-30 – 2019-05-01 (×2): 650 mg via ORAL
  Filled 2019-04-30 (×2): qty 2

## 2019-04-30 MED ORDER — SODIUM CHLORIDE 0.9% FLUSH
3.0000 mL | Freq: Two times a day (BID) | INTRAVENOUS | Status: DC
  Administered 2019-04-30 – 2019-05-01 (×5): 3 mL via INTRAVENOUS

## 2019-04-30 MED ORDER — MUPIROCIN 2 % EX OINT
1.0000 "application " | TOPICAL_OINTMENT | Freq: Two times a day (BID) | CUTANEOUS | Status: DC
  Filled 2019-04-30: qty 22

## 2019-04-30 MED ORDER — HYDROCHLOROTHIAZIDE 12.5 MG PO CAPS: 12.5000 mg | ORAL_CAPSULE | Freq: Every day | ORAL | Status: DC

## 2019-04-30 MED ORDER — INFLUENZA VAC A&B SA ADJ QUAD 0.5 ML IM PRSY
0.5000 mL | PREFILLED_SYRINGE | INTRAMUSCULAR | Status: AC
  Administered 2019-05-02: 0.5 mL via INTRAMUSCULAR
  Filled 2019-04-30 (×2): qty 0.5

## 2019-04-30 MED ORDER — INSULIN ASPART 100 UNIT/ML ~~LOC~~ SOLN: 0.0000 [IU] | Freq: Every day | SUBCUTANEOUS | Status: DC

## 2019-04-30 MED ORDER — ONDANSETRON HCL 4 MG PO TABS: 4.0000 mg | ORAL_TABLET | Freq: Four times a day (QID) | ORAL | Status: DC | PRN

## 2019-04-30 MED ORDER — DILTIAZEM HCL 25 MG/5ML IV SOLN
10.0000 mg | Freq: Once | INTRAVENOUS | Status: AC
  Administered 2019-04-30: 10 mg via INTRAVENOUS
  Filled 2019-04-30: qty 5

## 2019-04-30 MED ORDER — ALBUTEROL SULFATE (2.5 MG/3ML) 0.083% IN NEBU: 3.0000 mL | INHALATION_SOLUTION | RESPIRATORY_TRACT | Status: DC | PRN

## 2019-04-30 MED ORDER — KETOROLAC TROMETHAMINE 0.5 % OP SOLN
1.0000 [drp] | Freq: Four times a day (QID) | OPHTHALMIC | Status: DC
  Administered 2019-04-30 – 2019-05-02 (×9): 1 [drp] via OPHTHALMIC
  Filled 2019-04-30: qty 5

## 2019-04-30 MED ORDER — HEPARIN (PORCINE) 25000 UT/250ML-% IV SOLN
1450.0000 [IU]/h | INTRAVENOUS | Status: DC
  Administered 2019-04-30: 1450 [IU]/h via INTRAVENOUS
  Filled 2019-04-30: qty 250

## 2019-04-30 MED ORDER — LOSARTAN POTASSIUM-HCTZ 100-25 MG PO TABS: 1.0000 | ORAL_TABLET | Freq: Every day | ORAL | Status: DC

## 2019-04-30 MED ORDER — LORATADINE 10 MG PO TABS
10.0000 mg | ORAL_TABLET | Freq: Every day | ORAL | Status: DC
  Administered 2019-04-30 – 2019-05-02 (×3): 10 mg via ORAL
  Filled 2019-04-30 (×3): qty 1

## 2019-04-30 MED ORDER — ONDANSETRON HCL 4 MG/2ML IJ SOLN: 4.0000 mg | Freq: Four times a day (QID) | INTRAMUSCULAR | Status: DC | PRN

## 2019-04-30 MED ORDER — HYDRALAZINE HCL 20 MG/ML IJ SOLN
5.0000 mg | Freq: Three times a day (TID) | INTRAMUSCULAR | Status: DC | PRN
  Administered 2019-05-01: 5 mg via INTRAVENOUS
  Filled 2019-04-30 (×2): qty 1

## 2019-04-30 MED ORDER — INSULIN ASPART PROT & ASPART (70-30 MIX) 100 UNIT/ML ~~LOC~~ SUSP
22.0000 [IU] | Freq: Two times a day (BID) | SUBCUTANEOUS | Status: DC
  Administered 2019-04-30 – 2019-05-02 (×4): 22 [IU] via SUBCUTANEOUS
  Filled 2019-04-30 (×2): qty 10

## 2019-04-30 MED ORDER — ENOXAPARIN SODIUM 40 MG/0.4ML ~~LOC~~ SOLN: 40.0000 mg | Freq: Every day | SUBCUTANEOUS | Status: DC

## 2019-04-30 MED ORDER — APIXABAN 2.5 MG PO TABS
2.5000 mg | ORAL_TABLET | Freq: Two times a day (BID) | ORAL | Status: DC
  Administered 2019-04-30: 11:00:00 2.5 mg via ORAL
  Filled 2019-04-30: qty 1

## 2019-04-30 MED ORDER — INSULIN ASPART 100 UNIT/ML ~~LOC~~ SOLN
0.0000 [IU] | Freq: Three times a day (TID) | SUBCUTANEOUS | Status: DC
  Administered 2019-04-30: 1 [IU] via SUBCUTANEOUS
  Administered 2019-04-30: 2 [IU] via SUBCUTANEOUS
  Administered 2019-04-30: 1 [IU] via SUBCUTANEOUS
  Administered 2019-05-01: 3 [IU] via SUBCUTANEOUS
  Administered 2019-05-01: 5 [IU] via SUBCUTANEOUS

## 2019-04-30 MED ORDER — PREDNISOLONE ACETATE 1 % OP SUSP
1.0000 [drp] | Freq: Three times a day (TID) | OPHTHALMIC | Status: DC
  Administered 2019-04-30 – 2019-05-02 (×7): 1 [drp] via OPHTHALMIC
  Filled 2019-04-30: qty 5

## 2019-04-30 MED ORDER — AMLODIPINE BESYLATE 5 MG PO TABS: 5.0000 mg | ORAL_TABLET | Freq: Two times a day (BID) | ORAL | Status: DC

## 2019-04-30 MED ORDER — DOXAZOSIN MESYLATE 4 MG PO TABS
4.0000 mg | ORAL_TABLET | Freq: Every day | ORAL | Status: DC
  Administered 2019-04-30 – 2019-05-01 (×2): 4 mg via ORAL
  Filled 2019-04-30 (×2): qty 1

## 2019-04-30 MED ORDER — VITAMIN D 25 MCG (1000 UNIT) PO TABS
2000.0000 [IU] | ORAL_TABLET | Freq: Every day | ORAL | Status: DC
  Administered 2019-04-30 – 2019-05-02 (×3): 2000 [IU] via ORAL
  Filled 2019-04-30 (×3): qty 2

## 2019-04-30 MED ORDER — LOSARTAN POTASSIUM 50 MG PO TABS
100.0000 mg | ORAL_TABLET | Freq: Every day | ORAL | Status: DC
  Administered 2019-04-30 – 2019-05-02 (×3): 100 mg via ORAL
  Filled 2019-04-30 (×3): qty 2

## 2019-04-30 NOTE — Progress Notes (Signed)
Called and gave report to [place]. Will prepare to transfer.

## 2019-04-30 NOTE — Progress Notes (Signed)
Pt transferred to 2H03. Received by Winfred Burn. No acute distress noted.

## 2019-04-30 NOTE — Progress Notes (Signed)
ANTICOAGULATION CONSULT NOTE - Initial Consult  Pharmacy Consult for Heparin Indication: atrial fibrillation  Allergies  Allergen Reactions  . Statins Other (See Comments)    pain    Patient Measurements: Height: 6\' 3"  (190.5 cm) Weight: 240 lb 15.4 oz (109.3 kg) IBW/kg (Calculated) : 84.5 Heparin Dosing Weight: 106.7 kg  Vital Signs: Temp: 98 F (36.7 C) (09/13 1157) BP: 174/105 (09/13 1544) Pulse Rate: 134 (09/13 1544)  Labs: Recent Labs    04/29/19 2111 04/29/19 2253 04/30/19 0253  HGB 13.1  --   --   HCT 40.4  --   --   PLT 184  --   --   CREATININE 1.55*  --  1.43*  TROPONINIHS 11 15  --     Estimated Creatinine Clearance: 49.5 mL/min (A) (by C-G formula based on SCr of 1.43 mg/dL (H)).   Medical History: Past Medical History:  Diagnosis Date  . Atypical atrial flutter ([place]) 10/17/2018  . Diabetes mellitus without complication ([place])   . High cholesterol   . History of prostate cancer   . HTN (hypertension)   . Renal insufficiency     Medications:  Medications Prior to Admission  Medication Sig Dispense Refill Last Dose  . albuterol (PROVENTIL HFA;VENTOLIN HFA) 108 (90 Base) MCG/ACT inhaler Inhale 2 puffs into the lungs every 4 (four) hours as needed for wheezing or shortness of breath (cough, shortness of breath or wheezing.). 1 Inhaler 1 Past Month at Unknown time  . apixaban (ELIQUIS) 2.5 MG TABS tablet Take 1 tablet (2.5 mg total) by mouth 2 (two) times daily. 180 tablet 2 04/29/2019 at 1100  . Cholecalciferol (VITAMIN D3) 50 MCG (2000 UT) TABS Take 2,000 Units by mouth daily.   04/29/2019 at Unknown time  . diltiazem (CARDIZEM) 30 MG tablet Take 1 tablet (30 mg total) by mouth every 8 (eight) hours as needed. Palpitations 60 tablet 2 04/28/2019 at Unknown time  . doxazosin (CARDURA) 4 MG tablet Take 1 tablet (4 mg total) by mouth at bedtime. 90 tablet 1 04/28/2019 at Unknown time  . EASY TOUCH INSULIN SYRINGE 30G X 5/16" 0.5 ML MISC USE 3 TIMES DAILY 30  each 4 04/29/2019 at Unknown time  . fexofenadine (ALLEGRA) 180 MG tablet Take 180 mg by mouth daily as needed (allergies.).    Past Month at Unknown time  . HUMALOG MIX 50/50 (50-50) 100 UNIT/ML SUSP injection Inject 22-25 Units into the skin 2 (two) times daily. 25 units in the morning and 22 units in the afternoon   04/29/2019 at am  . hydrALAZINE (APRESOLINE) 25 MG tablet Take 1 tablet (25 mg total) by mouth 2 (two) times daily. 60 tablet 0 04/29/2019 at am  . ketorolac (ACULAR) 0.5 % ophthalmic solution Place 1 drop into both eyes 4 (four) times daily.    04/29/2019 at Unknown time  . losartan-hydrochlorothiazide (HYZAAR) 100-25 MG tablet TAKE 1 TABLET BY MOUTH  DAILY (Patient taking differently: Take 1 tablet by mouth daily. ) 90 tablet 3 04/29/2019 at Unknown time  . metFORMIN (GLUCOPHAGE-XR) 500 MG 24 hr tablet Take 1,000 mg by mouth 2 (two) times daily.    04/29/2019 at am  . metoprolol tartrate (LOPRESSOR) 25 MG tablet TAKE 1 TABLET BY MOUTH  TWICE DAILY (Patient taking differently: Take 25 mg by mouth 2 (two) times daily. ) 180 tablet 3 04/29/2019 at 1100  . prednisoLONE acetate (PRED FORTE) 1 % ophthalmic suspension Place 1 drop into both eyes 3 (three) times daily.  04/29/2019 at Unknown time  . amLODipine (NORVASC) 5 MG tablet TAKE 1 TABLET BY MOUTH  TWICE DAILY (Patient not taking: Reported on 04/29/2019) 180 tablet 3 Not Taking at am    Assessment: 85 y.o male admitted 04/30/19 to [facility] with 2 episodes of unexplained syncope. ,  also had seizure-like episode with 2nd episode of syncope.  CBC within normal. SCr 1.43 Patient on Apixaban 2.5 mg bid PTA,  Last dose given inpatient today 9/13 at 11:00 AM,  Will start IV heparin drip 12 hours after last apixaban dose. We will monitor IV heparin using aPTTs as heparin level will be elevated due to being on apixaban.  Goal of Therapy:  Heparin level 0.3-0.7 units/ml aPTT 66-102 seconds Monitor platelets by anticoagulation protocol: Yes   Plan:   STAT aPTT and heparin level  Will start IV Heparin drip at 23:00 tonight, 12 hours after last apixaban dose.  No bolus heparin. Heparin drip rate will be 1450 units/hr   Check 8 hr aPTT Daily aPTT, HL, and CBC. Monitor for bleeding.  Thank you for allowing pharmacy to be part of this patients care team. Nicole Cella, RPh Clinical Pharmacist  Please check AMION for all [place] phone numbers After 10:00 PM, call [place] [phone] 04/30/2019,5:07 PM

## 2019-04-30 NOTE — Progress Notes (Addendum)
Progress Note    Marcus Johnston  [id number] DOB: 06/03/1931  DOA: 04/29/2019 PCP: Glendale Chard, MD    Brief Narrative:     Medical records reviewed and are as summarized below:  Marcus Johnston is an 85 y.o. male with medical history significant of atrial fibrillation, diabetes, hypertension, history of TIA, hyperlipidemia, chronic kidney disease stage III, history of prostate cancer who was apparently driving with his wife when he started having slight altered mental status.  Patient was noted by the wife to fall out of his the car when she stopped and he was getting out.  He did strike his head when he fell.  She had no idea how he fell.  Later on he had another episode after regaining full consciousness.  This time he was in a seated position when he started having generalized shaking for about 5 seconds.  At that time he was unresponsive when he came back he asked what happened.  About an hour later he had another episode this time around lasted about 10 seconds.  Assessment/Plan:   Principal Problem:   Syncope and collapse Active Problems:   history of TIA (transient ischemic attack)   Essential hypertension   Type 2 diabetes mellitus with other circulatory complications (HCC)   Paroxysmal atrial flutter (HCC)   Bradycardia    syncope versus seizure:  -unclear but tele with slow HR, fast HR and R on T (44-175) -recent echo outpatient with Dr. Shanon Brow -patient back to normal so EEG less useful-- if he has episode here and tele unrevealing may need EEG and neuro consult -orthostatics not done in ER nor prior to IVF -PT/OT eval  sinus bradycardia:  Patient initially had tachycardia and now bradycardia.  Not sure if he is having tachybradycardia syndrome as a result of nodal disease.   -continue to monitor on telemetry -Dr. Einar Gip consulted and await recommendations  diabetes:  -Continue home regimen with sliding scale insulin. -adjust as  needed  hypertension:  -recently taken off norvasc and placed on hydralazine 25 mg BID  obesity Body mass index is 30.12 kg/m.   Family Communication/Anticipated D/C date and plan/Code Status   DVT prophylaxis: eliquis Code Status: Full Code.  Family Communication: spoke with wife Disposition Plan:  Needs to remain in the hospital for telemetry monitoring and possible pacemaker  Medical Consultants:    cards     Subjective:   Head tender where he fell-- does not remember episodes yesterday   Objective:    Vitals:   04/30/19 0400 04/30/19 0412 04/30/19 0420 04/30/19 0535  BP: (!) 175/66 (!) 175/66 (!) 175/66 (!) 152/69  Pulse:   64 62  Resp:   16   Temp:   (!) 97.4 F (36.3 C)   TempSrc:      SpO2:   99%   Weight:      Height:        Intake/Output Summary (Last 24 hours) at 04/30/2019 1152 Last data filed at 04/30/2019 0600 Gross per 24 hour  Intake 1113.74 ml  Output -  Net 1113.74 ml   Filed Weights   04/30/19 0159  Weight: 109.3 kg    Exam: Sitting in chair-- younger than stated rrr- HR between 50-60 No wheezing, no increased work of breathing Moves all 4 ext A+Ox3 Data Reviewed:   I have personally reviewed following labs and imaging studies:  Labs: Labs show the following:   Basic Metabolic Panel: Recent Labs  Lab 04/29/19 2111 04/30/19  0253  NA 134* 138  K 4.1 3.7  CL 104 107  CO2 18* 22  GLUCOSE 240* 174*  BUN 25* 21  CREATININE 1.55* 1.43*  CALCIUM 8.9 8.9   GFR Estimated Creatinine Clearance: 49.5 mL/min (A) (by C-G formula based on SCr of 1.43 mg/dL (H)). Liver Function Tests: Recent Labs  Lab 04/30/19 0253  AST 22  ALT 25  ALKPHOS 67  BILITOT 0.9  PROT 6.0*  ALBUMIN 3.2*   No results for input(s): LIPASE, AMYLASE in the last 168 hours. No results for input(s): AMMONIA in the last 168 hours. Coagulation profile No results for input(s): INR, PROTIME in the last 168 hours.  CBC: Recent Labs  Lab  04/29/19 2111  WBC 6.0  NEUTROABS 4.4  HGB 13.1  HCT 40.4  MCV 85.1  PLT 184   Cardiac Enzymes: No results for input(s): CKTOTAL, CKMB, CKMBINDEX, TROPONINI in the last 168 hours. BNP (last 3 results) No results for input(s): PROBNP in the last 8760 hours. CBG: No results for input(s): GLUCAP in the last 168 hours. D-Dimer: No results for input(s): DDIMER in the last 72 hours. Hgb A1c: No results for input(s): HGBA1C in the last 72 hours. Lipid Profile: No results for input(s): CHOL, HDL, LDLCALC, TRIG, CHOLHDL, LDLDIRECT in the last 72 hours. Thyroid function studies: Recent Labs    04/30/19 0253  TSH 2.437   Anemia work up: No results for input(s): VITAMINB12, FOLATE, FERRITIN, TIBC, IRON, RETICCTPCT in the last 72 hours. Sepsis Labs: Recent Labs  Lab 04/29/19 2111 04/29/19 2253 04/30/19 0127  WBC 6.0  --   --   LATICACIDVEN  --  2.3* 1.4    Microbiology Recent Results (from the past 240 hour(s))  SARS CORONAVIRUS 2 (TAT 6-24 HRS) Nasopharyngeal Nasopharyngeal Swab     Status: None   Collection Time: 04/29/19 11:04 PM   Specimen: Nasopharyngeal Swab  Result Value Ref Range Status   SARS Coronavirus 2 NEGATIVE NEGATIVE Final    Comment: (NOTE) SARS-CoV-2 target nucleic acids are NOT DETECTED. The SARS-CoV-2 RNA is generally detectable in upper and lower respiratory specimens during the acute phase of infection. Negative results do not preclude SARS-CoV-2 infection, do not rule out co-infections with other pathogens, and should not be used as the sole basis for treatment or other patient management decisions. Negative results must be combined with clinical observations, patient history, and epidemiological information. The expected result is Negative. Fact Sheet for Patients: SugarRoll.be Fact Sheet for Healthcare Providers: https://www.woods-mathews.com/ This test is not yet approved or cleared by the [place]  FDA and  has been authorized for detection and/or diagnosis of SARS-CoV-2 by FDA under an Emergency Use Authorization (EUA). This EUA will remain  in effect (meaning this test can be used) for the duration of the COVID-19 declaration under Section 56 4(b)(1) of the Act, 21 U.S.C. section 360bbb-3(b)(1), unless the authorization is terminated or revoked sooner. Performed at [facility], [facility] [address] [place], [place] [address]     Procedures and diagnostic studies:  Ct Head Wo Contrast  Result Date: 04/29/2019 CLINICAL DATA:  Head trauma, minor, pt on anticoagulation EXAM: CT HEAD WITHOUT CONTRAST TECHNIQUE: Contiguous axial images were obtained from the base of the skull through the vertex without intravenous contrast. COMPARISON:  Head CT and brain MRI March 2018 FINDINGS: Brain: No intracranial hemorrhage, mass effect, or midline shift. Unchanged degree of atrophy and chronic small vessel ischemia from prior exam. No hydrocephalus. The basilar cisterns are patent.  No evidence of territorial infarct or acute ischemia. No extra-axial or intracranial fluid collection. Vascular: No hyperdense vessel. Skull: No fracture or focal lesion. Sinuses/Orbits: Paranasal sinuses and mastoid air cells are clear. The visualized orbits are unremarkable. Bilateral cataract resection. Other: Mild right frontal scalp scarring. IMPRESSION: 1.  No acute intracranial abnormality. 2. Stable degree of atrophy and chronic small vessel ischemia. Electronically Signed   By: Keith Rake M.D.   On: 04/29/2019 22:48   Mr Brain Wo Contrast  Result Date: 04/30/2019 CLINICAL DATA:  Syncopal episodes.  Cardiac etiology favored. EXAM: MRI HEAD WITHOUT CONTRAST TECHNIQUE: Multiplanar, multiecho pulse sequences of the brain and surrounding structures were obtained without intravenous contrast. COMPARISON:  11/13/2016 FINDINGS: Brain: Diffusion imaging does not show any acute or subacute infarction. Brainstem is  normal. No focal cerebellar insult. Cerebral hemispheres show an old small vessel akin or infarction in the right thalamus, generalized age related cerebral hemispheric atrophy, and moderate chronic small-vessel ischemic changes of the deep and subcortical white matter, similar to the previous studies. No evidence of large vessel territory infarction, mass lesion, hemorrhage, hydrocephalus or extra-axial collection. Vascular: Major vessels at the base of the brain show flow. Skull and upper cervical spine: Negative Sinuses/Orbits: Clear/normal Other: None IMPRESSION: No acute or reversible finding. No visible change since the study of 2018. Age related atrophy with moderate chronic small-vessel ischemic changes as outlined above. Electronically Signed   By: Nelson Chimes M.D.   On: 04/30/2019 08:53    Medications:   . apixaban  2.5 mg Oral BID  . cholecalciferol  2,000 Units Oral Daily  . doxazosin  4 mg Oral QHS  . hydrALAZINE  25 mg Oral BID  . insulin aspart  0-5 Units Subcutaneous QHS  . insulin aspart  0-9 Units Subcutaneous TID WC  . insulin aspart protamine- aspart  22 Units Subcutaneous BID WC  . ketorolac  1 drop Both Eyes QID  . loratadine  10 mg Oral Daily  . losartan  100 mg Oral Daily  . prednisoLONE acetate  1 drop Both Eyes TID  . sodium chloride flush  3 mL Intravenous Q12H   Continuous Infusions:   LOS: 0 days   Geradine Girt  [facility]   How to contact the [facility] Attending or Consulting provider [place] or covering provider during after hours [place], for this patient?  1. Check the care team in [facility] and look for a) attending/consulting [facility] provider listed and b) the [facility] team listed 2. Log into [email or web address] and use [place]'s universal password to access. If you do not have the password, please contact the hospital operator. 3. Locate the [facility] provider you are looking for under [facility] and page to a number that you can be directly reached. 4. If you  still have difficulty reaching the provider, please page the [facility] (Director on Call) for the Hospitalists listed on amion for assistance.  04/30/2019, 11:52 AM

## 2019-04-30 NOTE — Evaluation (Addendum)
Occupational Therapy Evaluation and Discharge Patient Details Name: Marcus Johnston MRN: [id number] DOB: 02/20/1931 Today's Date: 04/30/2019    History of Present Illness Pt adm after several unresponsive episodes - syncope vs seizure. CT negative, MRI negative for acute findings. PMH - afib, htn, dm, ckd, prostate CA, TIA   Clinical Impression   This 85 yo male admitted with above presents to acute OT at an independent to Mod I level. No further OT needs, we will D/C from acute OT.    Follow Up Recommendations  No OT follow up    Equipment Recommendations  None recommended by OT       Precautions / Restrictions Precautions Precautions: Fall Precaution Comments: due to recent fall and sycopal v. seizure activity      Mobility Bed Mobility Overal bed mobility: Modified Independent             General bed mobility comments: use or rail and HOB up  Transfers Overall transfer level: Independent Equipment used: None                  Balance Overall balance assessment: No apparent balance deficits (not formally assessed)                                         ADL either performed or assessed with clinical judgement   ADL Overall ADL's : Independent;Modified independent                                             Vision Patient Visual Report: No change from baseline              Pertinent Vitals/Pain Pain Assessment: Faces Faces Pain Scale: Hurts little more Pain Location: back (from fall he believes) Pain Descriptors / Indicators: Aching;Sore Pain Intervention(s): Monitored during session;Repositioned;Heat applied     Hand Dominance Right   Extremity/Trunk Assessment Upper Extremity Assessment Upper Extremity Assessment: Overall WFL for tasks assessed           Communication Communication Communication: No difficulties   Cognition Arousal/Alertness: Awake/alert Behavior During Therapy: WFL for tasks  assessed/performed Overall Cognitive Status: Within Functional Limits for tasks assessed                                                Home Living Family/patient expects to be discharged to:: Private residence Living Arrangements: Spouse/significant other Available Help at Discharge: Family;Available 24 hours/day Type of Home: House Home Access: Stairs to enter [employer] of Steps: 1 and 1 Entrance Stairs-Rails: None Home Layout: Two level;Bed/bath upstairs Alternate Level Stairs-Number of Steps: stairs-landing-stairs Alternate Level Stairs-Rails: Right(going up steps) Bathroom Shower/Tub: Walk-in shower;Door   [employer] Toilet: Standard     Home Equipment: None          Prior Functioning/Environment Level of Independence: Independent                         OT Goals(Current goals can be found in the care plan section) Acute Rehab OT Goals Patient Stated Goal: to go home today  AM-PAC OT "6 Clicks" Daily Activity     Outcome Measure Help from another person eating meals?: None Help from another person taking care of personal grooming?: None Help from another person toileting, which includes using toliet, bedpan, or urinal?: None Help from another person bathing (including washing, rinsing, drying)?: None Help from another person to put on and taking off regular upper body clothing?: None Help from another person to put on and taking off regular lower body clothing?: None 6 Click Score: 24   End of Session Equipment Utilized During Treatment: Gait belt  Activity Tolerance: Patient tolerated treatment well Patient left: in chair;with call bell/phone within reach;with chair alarm set  OT Visit Diagnosis: Pain Pain - part of body: (mid back)                Time: [id number] OT Time Calculation (min): 19 min Charges:  OT General Charges $OT Visit: 1 Visit OT Evaluation $OT Eval Moderate Complexity: 1  Mod  [place], OTR/L Acute [employer] Pager [phone] Office [phone]     Almon Register 04/30/2019, 10:27 AM

## 2019-04-30 NOTE — Evaluation (Signed)
Physical Therapy Evaluation Patient Details Name: Marcus Johnston MRN: [id number] DOB: Sep 22, 1930 Today's Date: 04/30/2019   History of Present Illness  Pt adm after several unresponsive episodes - syncope vs seizure. CT negative, MRI negative for acute findings. PMH - afib, htn, dm, ckd, prostate CA, TIA  Clinical Impression  Pt doing well with mobility and no further PT needed.  Ready for dc from PT standpoint.      Follow Up Recommendations No PT follow up    Equipment Recommendations  None recommended by PT    Recommendations for Other Services       Precautions / Restrictions Precautions Precautions: Fall Precaution Comments: due to recent fall and syncopal v. seizure activity      Mobility  Bed Mobility Overal bed mobility: Modified Independent             General bed mobility comments: Pt up in chair  Transfers Overall transfer level: Independent Equipment used: None                Ambulation/Gait Ambulation/Gait assistance: Independent Gait Distance (Feet): 200 Feet Assistive device: None Gait Pattern/deviations: WFL(Within Functional Limits)   Gait velocity interpretation: >2.62 ft/sec, indicative of community ambulatory General Gait Details: Steady gait  Stairs Stairs: Yes Stairs assistance: Modified independent (Device/Increase time) Stair Management: One rail Right;Alternating pattern;Forwards Number of Stairs: 12    Wheelchair Mobility    Modified Rankin (Stroke Patients Only)       Balance Overall balance assessment: No apparent balance deficits (not formally assessed)                                           Pertinent Vitals/Pain Pain Assessment: Faces Faces Pain Scale: Hurts little more Pain Location: back (from fall he believes) Pain Descriptors / Indicators: Aching;Sore Pain Intervention(s): Monitored during session    Home Living Family/patient expects to be discharged to:: Private  residence Living Arrangements: Spouse/significant other Available Help at Discharge: Family;Available 24 hours/day Type of Home: House Home Access: Stairs to enter Entrance Stairs-Rails: None Entrance Stairs-Number of Steps: 1 and 1 Home Layout: Two level;Bed/bath upstairs Home Equipment: None      Prior Function Level of Independence: Independent         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, newspaper   Dominant Hand: Right    Extremity/Trunk Assessment   Upper Extremity Assessment Upper Extremity Assessment: Overall WFL for tasks assessed    Lower Extremity Assessment Lower Extremity Assessment: Overall WFL for tasks assessed       Communication   Communication: No difficulties  Cognition Arousal/Alertness: Awake/alert Behavior During Therapy: WFL for tasks assessed/performed Overall Cognitive Status: Within Functional Limits for tasks assessed                                        General Comments      Exercises     Assessment/Plan    PT Assessment Patent does not need any further PT services  PT Problem List         PT Treatment Interventions      PT Goals (Current goals can be found in the Care Plan section)  Acute Rehab PT Goals Patient Stated Goal: to go home today PT Goal Formulation: All assessment and education complete,  DC therapy    Frequency     Barriers to discharge        Co-evaluation               AM-PAC PT "6 Clicks" Mobility  Outcome Measure Help needed turning from your back to your side while in a flat bed without using bedrails?: None Help needed moving from lying on your back to sitting on the side of a flat bed without using bedrails?: None Help needed moving to and from a bed to a chair (including a wheelchair)?: None Help needed standing up from a chair using your arms (e.g., wheelchair or bedside chair)?: None Help needed to walk in hospital room?: None Help needed climbing 3-5 steps with a  railing? : None 6 Click Score: 24    End of Session   Activity Tolerance: Patient tolerated treatment well Patient left: Other (comment)(in bathroom with nurse present) Nurse Communication: Mobility status PT Visit Diagnosis: Other abnormalities of gait and mobility (R26.89)    Time: [id number] PT Time Calculation (min) (ACUTE ONLY): 15 min   Charges:   PT Evaluation $PT Eval Moderate Complexity: [place] Pager [phone] Office [place] 04/30/2019, 1:41 PM

## 2019-04-30 NOTE — Progress Notes (Signed)
Pt HR 160s while resting in chair. BP 106/86. Denies chest pain or flutters. Pt says "He feels like something is a little off, but it's not pain and doesn't know what it is". Paged and spoke with MD. Will give one time dose of IV cardizem per order and monitor

## 2019-04-30 NOTE — Consult Note (Addendum)
CARDIOLOGY CONSULT NOTE  Patient ID: Marcus Johnston MRN: [id number] DOB/AGE: 01/05/1931 85 y.o.  Admit date: 04/29/2019 Referring Physician  Marcus Bear, DO Primary Physician:  Marcus Chard, MD Reason for Consultation  Syncope  HPI:    Marcus Johnston  is a 85 y.o. male  with AA male who is fairly active and lives independently, hypertension, hyperlipidemia, diabetes mellitus, history of prior stroke/TIA, stage III chronic kidney disease, history of paroxysmal atrial flutter, atypical now admitted to the hospital with 2 episodes of unexplained syncope.  Patient also had seizure-like episode with 2nd episode of syncope.  On admission he was found to be bradycardic, metoprolol was discontinued.  Patient was also on diltiazem previously to be taken on a p.r.n. basis for atrial fibrillation/atypical atrial flutter with RVR, he has not taken diltiazem recently.  Metoprolol was discontinued and he was admitted for the for observation.  Patient then developed atrial fibrillation with RVR needing initiation of IV diltiazem therapy.  I was consulted to evaluate both syncope and atrial fibrillation.   3 Episodes of syncope, one when he got out of the car and fell to the ground. 2nd episode while sitting in a chair resting at home associated with seizure like activity and 3rd episode 1 hour later again associated with seizure like activity and post ictal state all three episodes, witnessed by his wife. No incontinence or tongue bite.  Past Medical History:  Diagnosis Date  . Atypical atrial flutter ([place]) 10/17/2018  . Diabetes mellitus without complication ([place])   . High cholesterol   . History of prostate cancer   . HTN (hypertension)   . Renal insufficiency     Past Surgical History:  Procedure Laterality Date  . prostate implant      Social History   Socioeconomic History  . Marital status: Married    Spouse name: Not on file  . Number of children: 5  . Years of education: Not on  file  . Highest education level: Not on file  Occupational History  . Occupation: retired  Scientific laboratory technician  . Financial resource strain: Not hard at all  . Food insecurity    Worry: Never true    Inability: Never true  . Transportation needs    Medical: No    Non-medical: No  Tobacco Use  . Smoking status: Never Smoker  . Smokeless tobacco: Never Used  Substance and Sexual Activity  . Alcohol use: No  . Drug use: No  . Sexual activity: Yes  Lifestyle  . Physical activity    Days per week: 0 days    Minutes per session: 0 min  . Stress: Not at all  Relationships  . Social Herbalist on phone: Not on file    Gets together: Not on file    Attends religious service: Not on file    Active member of club or organization: Not on file    Attends meetings of clubs or organizations: Not on file    Relationship status: Not on file  . Intimate partner violence    Fear of current or ex partner: No    Emotionally abused: No    Physically abused: No    Forced sexual activity: No  Other Topics Concern  . Not on file  Social History Narrative  . Not on file     09/12 0701 - 09/13 0700 In: 1113.7 [I.V.:113.7; IV Piggyback:1000] Out: -   Total I/O In: 145.6 [I.V.:145.6] Out: -  ROS  Review of Systems  Constitution: Negative for chills, decreased appetite, malaise/fatigue and weight gain.  Cardiovascular: Positive for palpitations. Negative for dyspnea on exertion, leg swelling and syncope.  Endocrine: Negative for cold intolerance.  Hematologic/Lymphatic: Does not bruise/bleed easily.  Musculoskeletal: Negative for joint swelling.  Gastrointestinal: Negative for abdominal pain, anorexia, change in bowel habit, hematochezia and melena.  Neurological: Positive for dizziness, light-headedness and seizures. Negative for headaches.  Psychiatric/Behavioral: Negative for depression and substance abuse.  All other systems reviewed and are negative.   Objective  Blood  pressure (!) 174/105, pulse (!) 134, temperature 98 F (36.7 C), resp. rate 18, height 6\' 3"  (1.905 m), weight 109.3 kg, SpO2 98 %. Body mass index is 30.12 kg/m. Vitals with BMI 04/30/2019 04/30/2019 04/30/2019  Height - - -  Weight - - -  BMI - - -  Systolic [id number]  Diastolic [id number] 86 70  Pulse 134 165 68    Blood pressure (!) 174/105, pulse (!) 134, temperature 98 F (36.7 C), resp. rate 18, height 6\' 3"  (1.905 m), weight 109.3 kg, SpO2 98 %. Body mass index is 30.12 kg/m.  Physical Exam  Constitutional: He appears well-developed and well-nourished. No distress.  HENT:  Head: Atraumatic.  Eyes: Conjunctivae are normal.  Neck: Neck supple. No JVD present. No thyromegaly present.  Cardiovascular: Intact distal pulses and normal pulses. An irregularly irregular rhythm present. Tachycardia present. Exam reveals no gallop, no S3 and no S4.  No murmur heard. S1 is variable, S2 is normal. No leg edema, no JVD   Pulmonary/Chest: Effort normal and breath sounds normal.  Abdominal: Soft. Bowel sounds are normal.  Musculoskeletal: Normal range of motion.        General: No edema.  Neurological: He is alert.  Skin: Skin is warm and dry.  Psychiatric: He has a normal mood and affect.   Radiology: Ct Head Wo Contrast  Result Date: 04/29/2019 CLINICAL DATA:  Head trauma, minor, pt on anticoagulation EXAM: CT HEAD WITHOUT CONTRAST TECHNIQUE: Contiguous axial images were obtained from the base of the skull through the vertex without intravenous contrast. COMPARISON:  Head CT and brain MRI March 2018 FINDINGS: Brain: No intracranial hemorrhage, mass effect, or midline shift. Unchanged degree of atrophy and chronic small vessel ischemia from prior exam. No hydrocephalus. The basilar cisterns are patent. No evidence of territorial infarct or acute ischemia. No extra-axial or intracranial fluid collection. Vascular: No hyperdense vessel. Skull: No fracture or focal lesion. Sinuses/Orbits: Paranasal  sinuses and mastoid air cells are clear. The visualized orbits are unremarkable. Bilateral cataract resection. Other: Mild right frontal scalp scarring. IMPRESSION: 1.  No acute intracranial abnormality. 2. Stable degree of atrophy and chronic small vessel ischemia. Electronically Signed   By: Keith Rake M.D.   On: 04/29/2019 22:48   Mr Brain Wo Contrast  Result Date: 04/30/2019 CLINICAL DATA:  Syncopal episodes.  Cardiac etiology favored. EXAM: MRI HEAD WITHOUT CONTRAST TECHNIQUE: Multiplanar, multiecho pulse sequences of the brain and surrounding structures were obtained without intravenous contrast. COMPARISON:  11/13/2016 FINDINGS: Brain: Diffusion imaging does not show any acute or subacute infarction. Brainstem is normal. No focal cerebellar insult. Cerebral hemispheres show an old small vessel akin or infarction in the right thalamus, generalized age related cerebral hemispheric atrophy, and moderate chronic small-vessel ischemic changes of the deep and subcortical white matter, similar to the previous studies. No evidence of large vessel territory infarction, mass lesion, hemorrhage, hydrocephalus or extra-axial collection. Vascular: Major vessels at the  base of the brain show flow. Skull and upper cervical spine: Negative Sinuses/Orbits: Clear/normal Other: None IMPRESSION: No acute or reversible finding. No visible change since the study of 2018. Age related atrophy with moderate chronic small-vessel ischemic changes as outlined above. Electronically Signed   By: Nelson Chimes M.D.   On: 04/30/2019 08:53    Laboratory examination:   Recent Labs    03/23/19 1339 04/29/19 2111 04/30/19 0253  NA 140 134* 138  K 4.1 4.1 3.7  CL 104 104 107  CO2 22 18* 22  GLUCOSE 225* 240* 174*  BUN 25 25* 21  CREATININE 1.39* 1.55* 1.43*  CALCIUM 9.8 8.9 8.9  GFRNONAA 46* 40* 44*  GFRAA 53* 46* 51*   CMP Latest Ref Rng & Units 04/30/2019 04/29/2019 03/23/2019  Glucose 70 - 99 mg/dL 174(H) 240(H)  225(H)  BUN 8 - 23 mg/dL 21 25(H) 25  Creatinine 0.61 - 1.24 mg/dL 1.43(H) 1.55(H) 1.39(H)  Sodium 135 - 145 mmol/L 138 134(L) 140  Potassium 3.5 - 5.1 mmol/L 3.7 4.1 4.1  Chloride 98 - 111 mmol/L 107 104 104  CO2 22 - 32 mmol/L 22 18(L) 22  Calcium 8.9 - 10.3 mg/dL 8.9 8.9 9.8  Total Protein 6.5 - 8.1 g/dL 6.0(L) - 6.5  Total Bilirubin 0.3 - 1.2 mg/dL 0.9 - 0.5  Alkaline Phos 38 - 126 U/L 67 - 86  AST 15 - 41 U/L 22 - 13  ALT 0 - 44 U/L 25 - 9   CBC Latest Ref Rng & Units 04/29/2019 10/09/2018 11/13/2016  WBC 4.0 - 10.5 K/uL 6.0 3.8(L) 5.1  Hemoglobin 13.0 - 17.0 g/dL 13.1 12.7(L) 13.1  Hematocrit 39.0 - 52.0 % 40.4 41.4 39.9  Platelets 150 - 400 K/uL 184 187 189   Lipid Panel     Component Value Date/Time   CHOL 158 03/23/2019 1339   TRIG 72 03/23/2019 1339   HDL 76 03/23/2019 1339   CHOLHDL 2.1 03/23/2019 1339   CHOLHDL 2.3 11/13/2016 0321   VLDL 11 11/13/2016 0321   LDLCALC 68 03/23/2019 1339   HEMOGLOBIN A1C Lab Results  Component Value Date   HGBA1C 7.9 (H) 11/13/2016   MPG 180 11/13/2016   TSH Recent Labs    10/17/18 1429 04/30/19 0253  TSH 2.370 2.437   Medications   Prior to Admission medications   Medication Sig Start Date End Date Taking? Authorizing Provider  albuterol (PROVENTIL HFA;VENTOLIN HFA) 108 (90 Base) MCG/ACT inhaler Inhale 2 puffs into the lungs every 4 (four) hours as needed for wheezing or shortness of breath (cough, shortness of breath or wheezing.). 08/04/18  Yes Robyn Haber, MD  apixaban (ELIQUIS) 2.5 MG TABS tablet Take 1 tablet (2.5 mg total) by mouth 2 (two) times daily. 11/14/18  Yes Patwardhan, Manish J, MD  Cholecalciferol (VITAMIN D3) 50 MCG (2000 UT) TABS Take 2,000 Units by mouth daily.   Yes [provider]  diltiazem (CARDIZEM) 30 MG tablet Take 1 tablet (30 mg total) by mouth every 8 (eight) hours as needed. Palpitations 11/07/18  Yes Patwardhan, Manish J, MD  doxazosin (CARDURA) 4 MG tablet Take 1 tablet (4 mg  total) by mouth at bedtime. 11/14/18  Yes Marcus Chard, MD  EASY [facility] INSULIN SYRINGE 30G X 5/16" 0.5 ML MISC USE 3 TIMES DAILY 11/07/18  Yes Marcus Chard, MD  fexofenadine (ALLEGRA) 180 MG tablet Take 180 mg by mouth daily as needed (allergies.).    Yes [provider]  HUMALOG MIX 50/50 (50-50) 100  UNIT/ML SUSP injection Inject 22-25 Units into the skin 2 (two) times daily. 25 units in the morning and 22 units in the afternoon 08/17/18  Yes [provider]  hydrALAZINE (APRESOLINE) 25 MG tablet Take 1 tablet (25 mg total) by mouth 2 (two) times daily. 04/19/19 04/18/20 Yes Marcus Chard, MD  ketorolac (ACULAR) 0.5 % ophthalmic solution Place 1 drop into both eyes 4 (four) times daily.  08/17/18  Yes [provider]  losartan-hydrochlorothiazide (HYZAAR) 100-25 MG tablet TAKE 1 TABLET BY MOUTH  DAILY Patient taking differently: Take 1 tablet by mouth daily.  04/10/19  Yes Marcus Chard, MD  metFORMIN (GLUCOPHAGE-XR) 500 MG 24 hr tablet Take 1,000 mg by mouth 2 (two) times daily.    Yes [provider]  metoprolol tartrate (LOPRESSOR) 25 MG tablet TAKE 1 TABLET BY MOUTH  TWICE DAILY Patient taking differently: Take 25 mg by mouth 2 (two) times daily.  04/13/19  Yes Adrian Prows, MD  prednisoLONE acetate (PRED FORTE) 1 % ophthalmic suspension Place 1 drop into both eyes 3 (three) times daily. 04/11/19  Yes [provider]  amLODipine (NORVASC) 5 MG tablet TAKE 1 TABLET BY MOUTH  TWICE DAILY Patient not taking: Reported on 04/29/2019 03/27/19   Nigel Mormon, MD     Current Outpatient Medications  Medication Instructions  . albuterol (PROVENTIL HFA;VENTOLIN HFA) 108 (90 Base) MCG/ACT inhaler 2 puffs, Inhalation, Every 4 hours PRN  . amLODipine (NORVASC) 5 MG tablet TAKE 1 TABLET BY MOUTH  TWICE DAILY  . apixaban (ELIQUIS) 2.5 mg, Oral, 2 times daily  . diltiazem (CARDIZEM) 30 mg, Oral, Every 8 hours PRN, Palpitations  . doxazosin (CARDURA) 4 mg, Oral,  Daily at bedtime  . EASY TOUCH INSULIN SYRINGE 30G X 5/16" 0.5 ML MISC USE 3 TIMES DAILY  . fexofenadine (ALLEGRA) 180 mg, Oral, Daily PRN  . HUMALOG MIX 50/50 (50-50) 100 UNIT/ML SUSP injection 22-25 Units, Subcutaneous, 2 times daily, 25 units in the morning and 22 units in the afternoon  . hydrALAZINE (APRESOLINE) 25 mg, Oral, 2 times daily  . ketorolac (ACULAR) 0.5 % ophthalmic solution 1 drop, Both Eyes, 4 times daily  . losartan-hydrochlorothiazide (HYZAAR) 100-25 MG tablet TAKE 1 TABLET BY MOUTH  DAILY  . metFORMIN (GLUCOPHAGE-XR) 1,000 mg, Oral, 2 times daily  . metoprolol tartrate (LOPRESSOR) 25 MG tablet TAKE 1 TABLET BY MOUTH  TWICE DAILY  . prednisoLONE acetate (PRED FORTE) 1 % ophthalmic suspension 1 drop, Both Eyes, 3 times daily  . Vitamin D3 2,000 Units, Oral, Daily    Cardiac Studies:   Echocardiogram 01/20/2019: Normal LV systolic function with EF 59%. Left ventricle cavity is normal in size. Moderate concentric hypertrophy of the left ventricle. Normal global wall motion. Doppler evidence of grade I (impaired) diastolic dysfunction, normal LAP. Calculated EF 59%. Moderate (Grade II) mitral regurgitation. Moderate tricuspid regurgitation. Estimated pulmonary artery systolic pressure 40 mmHg.  Vascular US Carotid Bilateral 11/14/2016: intimal wall thickness CCA. 1-39% ICA plaquing. Vertebral artery flow is antegrade.  Assessment  1.  Atrial tachycardia 2.  Paroxysmal episodes of atypical atrial Flutter with RVR CHA2DS2VASc score 5, annual stroke risk 7%. 3.  Primary hypertension  Tele: 04/30/19 3:45 pm, A. Flutter with RVT V rate 170/min.  Converted to sinus bradycardia on IV diltiazem.  EKG 04/29/2019: Ectopic atrial tachycardia at the rate of 150 bpm, normal axis, incomplete right bundle branch block.  Occasional PVCs and aberrantly conducted beats with RBBB morphology.  EKG 11/07/2018: Sinus bradycardia at the rate  of 59 bpm, right atrial enlargement, left axis  deviation, left anterior fascicular block.  Incomplete right bundle branch block.  Baseline artifact.  Recommendations:   Patient will be transferred to the intensive care unit for close monitoring.  I'm concerned that he may develop significant bradycardia arrhythmias or ventricular standstill especially with patient needing IV diltiazem to control rate.Marland Kitchen  He will need placement of permanent dual-chamber pacemaker.  Continue anticoagulation.  We'll hold off on anti-correlation tonight in view of possible need for pacemaker tomorrow.  Will start the patient on IV heparin for now.  Blood pressure control has been difficult in the outpatient basis especially in view of underlying bradycardia.  Hence pacemaker placement will certainly help with further management of his hypertension as well.  Adrian Prows, MD, [facility] 04/30/2019, 4:07 PM [facility] [place] Pager: [phone] Office: [phone] If no answer Cell [phone]

## 2019-05-01 ENCOUNTER — Other Ambulatory Visit: Payer: Self-pay

## 2019-05-01 DIAGNOSIS — I480 Paroxysmal atrial fibrillation: Principal | ICD-10-CM

## 2019-05-01 LAB — CBC
HCT: 40.4 % (ref 39.0–52.0)
Hemoglobin: 13.3 g/dL (ref 13.0–17.0)
MCH: 27.6 pg (ref 26.0–34.0)
MCHC: 32.9 g/dL (ref 30.0–36.0)
MCV: 83.8 fL (ref 80.0–100.0)
Platelets: 179 10*3/uL (ref 150–400)
RBC: 4.82 MIL/uL (ref 4.22–5.81)
RDW: 15.3 % (ref 11.5–15.5)
WBC: 6.5 10*3/uL (ref 4.0–10.5)
nRBC: 0 % (ref 0.0–0.2)

## 2019-05-01 LAB — APTT: aPTT: 81 seconds — ABNORMAL HIGH (ref 24–36)

## 2019-05-01 LAB — HEMOGLOBIN A1C
Hgb A1c MFr Bld: 7.9 % — ABNORMAL HIGH (ref 4.8–5.6)
Mean Plasma Glucose: 180 mg/dL

## 2019-05-01 LAB — HEPARIN LEVEL (UNFRACTIONATED): Heparin Unfractionated: 0.76 [IU]/mL — ABNORMAL HIGH (ref 0.30–0.70)

## 2019-05-01 LAB — GLUCOSE, CAPILLARY
Glucose-Capillary: 80 mg/dL (ref 70–99)
Glucose-Capillary: 212 mg/dL — ABNORMAL HIGH (ref 70–99)
Glucose-Capillary: 282 mg/dL — ABNORMAL HIGH (ref 70–99)
Glucose-Capillary: 159 mg/dL — ABNORMAL HIGH (ref 70–99)

## 2019-05-01 MED ORDER — DILTIAZEM HCL 25 MG/5ML IV SOLN
10.0000 mg | Freq: Once | INTRAVENOUS | Status: AC
  Administered 2019-05-01: 10 mg via INTRAVENOUS
  Filled 2019-05-01: qty 5

## 2019-05-01 MED ORDER — METOPROLOL TARTRATE 25 MG PO TABS
25.0000 mg | ORAL_TABLET | Freq: Two times a day (BID) | ORAL | Status: DC
  Administered 2019-05-01 – 2019-05-02 (×2): 25 mg via ORAL
  Filled 2019-05-01 (×2): qty 1

## 2019-05-01 MED ORDER — DILTIAZEM HCL-DEXTROSE 100-5 MG/100ML-% IV SOLN (PREMIX)
5.0000 mg/h | INTRAVENOUS | Status: DC
  Administered 2019-05-01: 5 mg/h via INTRAVENOUS
  Filled 2019-05-01: qty 100

## 2019-05-01 MED ORDER — DILTIAZEM LOAD VIA INFUSION
10.0000 mg | Freq: Once | INTRAVENOUS | Status: DC
  Filled 2019-05-01: qty 10

## 2019-05-01 MED ORDER — DILTIAZEM HCL ER COATED BEADS 120 MG PO CP24
120.0000 mg | ORAL_CAPSULE | Freq: Every day | ORAL | Status: DC
  Administered 2019-05-01 – 2019-05-02 (×2): 120 mg via ORAL
  Filled 2019-05-01 (×2): qty 1

## 2019-05-01 NOTE — Progress Notes (Signed)
ANTICOAGULATION CONSULT NOTE - Initial Consult  Pharmacy Consult for Heparin Indication: atrial fibrillation  Allergies  Allergen Reactions  . Statins Other (See Comments)    pain    Patient Measurements: Height: 6\' 3"  (190.5 cm) Weight: 240 lb 15.4 oz (109.3 kg) IBW/kg (Calculated) : 84.5 Heparin Dosing Weight: 106.7 kg  Vital Signs: Temp: 98.3 F (36.8 C) (09/14 1140) Temp Source: Oral (09/14 1140) BP: 151/82 (09/14 1100)  Labs: Recent Labs    04/29/19 2111 04/29/19 2253 04/30/19 0253 04/30/19 1817 05/01/19 0741  HGB 13.1  --   --   --  13.3  HCT 40.4  --   --   --  40.4  PLT 184  --   --   --  179  APTT  --   --   --  35 81*  HEPARINUNFRC  --   --   --  0.79* 0.76*  CREATININE 1.55*  --  1.43*  --   --   TROPONINIHS 11 15  --   --   --     Estimated Creatinine Clearance: 49.5 mL/min (A) (by C-G formula based on SCr of 1.43 mg/dL (H)).   Medical History: Past Medical History:  Diagnosis Date  . Atypical atrial flutter ([place]) 10/17/2018  . Diabetes mellitus without complication ([place])   . High cholesterol   . History of prostate cancer   . HTN (hypertension)   . Renal insufficiency     Medications:  Medications Prior to Admission  Medication Sig Dispense Refill Last Dose  . albuterol (PROVENTIL HFA;VENTOLIN HFA) 108 (90 Base) MCG/ACT inhaler Inhale 2 puffs into the lungs every 4 (four) hours as needed for wheezing or shortness of breath (cough, shortness of breath or wheezing.). 1 Inhaler 1 Past Month at Unknown time  . apixaban (ELIQUIS) 2.5 MG TABS tablet Take 1 tablet (2.5 mg total) by mouth 2 (two) times daily. 180 tablet 2 04/29/2019 at 1100  . Cholecalciferol (VITAMIN D3) 50 MCG (2000 UT) TABS Take 2,000 Units by mouth daily.   04/29/2019 at Unknown time  . diltiazem (CARDIZEM) 30 MG tablet Take 1 tablet (30 mg total) by mouth every 8 (eight) hours as needed. Palpitations 60 tablet 2 04/28/2019 at Unknown time  . doxazosin (CARDURA) 4 MG tablet Take 1  tablet (4 mg total) by mouth at bedtime. 90 tablet 1 04/28/2019 at Unknown time  . EASY TOUCH INSULIN SYRINGE 30G X 5/16" 0.5 ML MISC USE 3 TIMES DAILY 30 each 4 04/29/2019 at Unknown time  . fexofenadine (ALLEGRA) 180 MG tablet Take 180 mg by mouth daily as needed (allergies.).    Past Month at Unknown time  . HUMALOG MIX 50/50 (50-50) 100 UNIT/ML SUSP injection Inject 22-25 Units into the skin 2 (two) times daily. 25 units in the morning and 22 units in the afternoon   04/29/2019 at am  . hydrALAZINE (APRESOLINE) 25 MG tablet Take 1 tablet (25 mg total) by mouth 2 (two) times daily. 60 tablet 0 04/29/2019 at am  . ketorolac (ACULAR) 0.5 % ophthalmic solution Place 1 drop into both eyes 4 (four) times daily.    04/29/2019 at Unknown time  . losartan-hydrochlorothiazide (HYZAAR) 100-25 MG tablet TAKE 1 TABLET BY MOUTH  DAILY (Patient taking differently: Take 1 tablet by mouth daily. ) 90 tablet 3 04/29/2019 at Unknown time  . metFORMIN (GLUCOPHAGE-XR) 500 MG 24 hr tablet Take 1,000 mg by mouth 2 (two) times daily.    04/29/2019 at am  . metoprolol  tartrate (LOPRESSOR) 25 MG tablet TAKE 1 TABLET BY MOUTH  TWICE DAILY (Patient taking differently: Take 25 mg by mouth 2 (two) times daily. ) 180 tablet 3 04/29/2019 at 1100  . prednisoLONE acetate (PRED FORTE) 1 % ophthalmic suspension Place 1 drop into both eyes 3 (three) times daily.   04/29/2019 at Unknown time  . amLODipine (NORVASC) 5 MG tablet TAKE 1 TABLET BY MOUTH  TWICE DAILY (Patient not taking: Reported on 04/29/2019) 180 tablet 3 Not Taking at am    Assessment: 85 y.o male admitted 04/30/19 to [facility] with 2 episodes of unexplained syncope. ,  also had seizure-like episode with 2nd episode of syncope.  CBC within normal. SCr 1.43 Patient on Apixaban 2.5 mg bid PTA,  Last dose given inpatient 9/13.  Heparin level unchanged and elevated due to recent apixaban dosing, aptt now at goal at 81s, cbc stable. No changes warranted at this time. EP consulted for  possible PPM.   Goal of Therapy:  Heparin level 0.3-0.7 units/ml aPTT 66-102 seconds Monitor platelets by anticoagulation protocol: Yes   Plan:  Heparin currently off for PPM Follow up plan  Thank you for allowing pharmacy to be part of this patients care team.  Erin Hearing PharmD., BCPS Clinical Pharmacist 05/01/2019 12:15 PM

## 2019-05-01 NOTE — Plan of Care (Signed)
Progressing no tachy brady  today  off cardizem drip[ started  on PO  cardizem will go home tommorow and be scheduled for ablation  next Monday.  Patient understands plan . Has some increase in HR on ambulation. No dizziness or symptomatolagy when ambulating.

## 2019-05-01 NOTE — Progress Notes (Signed)
PROGRESS NOTE    Marcus Johnston  [id number] DOB: 07-21-1931 DOA: 04/29/2019 PCP: Glendale Chard, MD   Brief Narrative:  Patient is 85 year old male with history of atrial fibrillation, diabetes, hypertension, TIA, hyperlipidemia, CKD stage III, history of prostate cancer when he developed altered mental status while driving with his wife.  Patient reported of a fall striking his head.  After that he had 2 episodes of syncopal.  Patient then presented to the emergency department.  During hospitalization. he was found to have possible Tachy-brady syndrome.  Cardiology following.  EP consulted today for possible consideration for PPM  Assessment & Plan:   Principal Problem:   Syncope and collapse Active Problems:   history of TIA (transient ischemic attack)   Essential hypertension   Type 2 diabetes mellitus with other circulatory complications (HCC)   Atypical atrial flutter (HCC)   Bradycardia   Syncope   Sick sinus syndrome ([place])  Multiple syncopal episodes: Cardiology following.  No syncopal episodes after admission.  Suspicion for underlying dysrrhythmia.  Patient seen by physical therapy, no recommendation offered.  Paroxysmal A. fib: CHA2DS2VASc score 5.Currently in normal sinus rhythm.  Follows with cardiology.  Currently on low-dose Cardizem drip and heparin.  He went into tachycardia alternating with sinus bradycardia.  Concern for tachy-bradycardia syndrome.  EP consulted for possible PPM.  Diabetes: Continue home regimen with sliding scale insulin.  Hypertension: Continue current medicines.  Currently mildly hypertensive.  CKD stage III: Currently kidney function at baseline.  Obesity: BMI of 30.12.         DVT prophylaxis: Heparin Code Status: Full Family Communication: My colleague discussed with wife on 04/30/2019.  Will call tomorrow with further updates Disposition Plan: Likely home tomorrow after full work-up   Consultants: Cardiology,  EP  Procedures: None  Antimicrobials:  Anti-infectives (From admission, onward)   None      Subjective:  Patient seen and examined the bedside this morning.  Currently hemodynamically stable.  Comfortable, alert and oriented.  Currently he is in normal sinus rhythm.  Denies any chest pain or shortness of breath.  Objective: Vitals:   05/01/19 0600 05/01/19 0700 05/01/19 0735 05/01/19 0800  BP: 139/74 (!) 146/81  (!) 164/81  Pulse:      Resp: (!) 7 16  19   Temp:   97.8 F (36.6 C)   TempSrc:   Oral   SpO2: 95% 98%  96%  Weight:      Height:        Intake/Output Summary (Last 24 hours) at 05/01/2019 1032 Last data filed at 05/01/2019 0700 Gross per 24 hour  Intake 280.41 ml  Output 400 ml  Net -119.59 ml   Filed Weights   04/30/19 0159  Weight: 109.3 kg    Examination:  General exam: Appears calm and comfortable obese,pleasant elderly gentle man HEENT:PERRL,Oral mucosa moist, Ear/Nose normal on gross exam Respiratory system: Bilateral equal air entry, normal vesicular breath sounds, no wheezes or crackles  Cardiovascular system: S1 & S2 heard, RRR. No JVD, murmurs, rubs, gallops or clicks. No pedal edema. Gastrointestinal system: Abdomen is nondistended, soft and nontender. No organomegaly or masses felt. Normal bowel sounds heard. Central nervous system: Alert and oriented. No focal neurological deficits. Extremities: No edema, no clubbing ,no cyanosis, distal peripheral pulses palpable. Skin: No rashes, lesions or ulcers,no icterus ,no pallor MSK: Normal muscle bulk,tone ,power Psychiatry: Judgement and insight appear normal. Mood & affect appropriate.     Data Reviewed: I have personally reviewed following labs and  imaging studies  CBC: Recent Labs  Lab 04/29/19 2111 05/01/19 0741  WBC 6.0 6.5  NEUTROABS 4.4  --   HGB 13.1 13.3  HCT 40.4 40.4  MCV 85.1 83.8  PLT 184 0000000   Basic Metabolic Panel: Recent Labs  Lab 04/29/19 2111 04/30/19 0253  NA  134* 138  K 4.1 3.7  CL 104 107  CO2 18* 22  GLUCOSE 240* 174*  BUN 25* 21  CREATININE 1.55* 1.43*  CALCIUM 8.9 8.9   GFR: Estimated Creatinine Clearance: 49.5 mL/min (A) (by C-G formula based on SCr of 1.43 mg/dL (H)). Liver Function Tests: Recent Labs  Lab 04/30/19 0253  AST 22  ALT 25  ALKPHOS 67  BILITOT 0.9  PROT 6.0*  ALBUMIN 3.2*   No results for input(s): LIPASE, AMYLASE in the last 168 hours. No results for input(s): AMMONIA in the last 168 hours. Coagulation Profile: No results for input(s): INR, PROTIME in the last 168 hours. Cardiac Enzymes: No results for input(s): CKTOTAL, CKMB, CKMBINDEX, TROPONINI in the last 168 hours. BNP (last 3 results) No results for input(s): PROBNP in the last 8760 hours. HbA1C: No results for input(s): HGBA1C in the last 72 hours. CBG: Recent Labs  Lab 04/30/19 1702 04/30/19 2233 05/01/19 0641  GLUCAP 164* 176* 80   Lipid Profile: No results for input(s): CHOL, HDL, LDLCALC, TRIG, CHOLHDL, LDLDIRECT in the last 72 hours. Thyroid Function Tests: Recent Labs    04/30/19 0253  TSH 2.437   Anemia Panel: No results for input(s): VITAMINB12, FOLATE, FERRITIN, TIBC, IRON, RETICCTPCT in the last 72 hours. Sepsis Labs: Recent Labs  Lab 04/29/19 2253 04/30/19 0127  LATICACIDVEN 2.3* 1.4    Recent Results (from the past 240 hour(s))  SARS CORONAVIRUS 2 (TAT 6-24 HRS) Nasopharyngeal Nasopharyngeal Swab     Status: None   Collection Time: 04/29/19 11:04 PM   Specimen: Nasopharyngeal Swab  Result Value Ref Range Status   SARS Coronavirus 2 NEGATIVE NEGATIVE Final    Comment: (NOTE) SARS-CoV-2 target nucleic acids are NOT DETECTED. The SARS-CoV-2 RNA is generally detectable in upper and lower respiratory specimens during the acute phase of infection. Negative results do not preclude SARS-CoV-2 infection, do not rule out co-infections with other pathogens, and should not be used as the sole basis for treatment or other  patient management decisions. Negative results must be combined with clinical observations, patient history, and epidemiological information. The expected result is Negative. Fact Sheet for Patients: SugarRoll.be Fact Sheet for Healthcare Providers: https://www.woods-mathews.com/ This test is not yet approved or cleared by the [place] FDA and  has been authorized for detection and/or diagnosis of SARS-CoV-2 by FDA under an Emergency Use Authorization (EUA). This EUA will remain  in effect (meaning this test can be used) for the duration of the COVID-19 declaration under Section 56 4(b)(1) of the Act, 21 U.S.C. section 360bbb-3(b)(1), unless the authorization is terminated or revoked sooner. Performed at [facility], [place] [address], [place], [place] [address]   Surgical PCR screen     Status: None   Collection Time: 04/30/19  6:13 PM   Specimen: Nasal Mucosa; Nasal Swab  Result Value Ref Range Status   MRSA, PCR NEGATIVE NEGATIVE Final   Staphylococcus aureus NEGATIVE NEGATIVE Final    Comment: (NOTE) The Xpert SA Assay (FDA approved for NASAL specimens in patients 28 years of age and older), is one component of a comprehensive surveillance program. It is not intended to diagnose infection nor to guide  or monitor treatment. Performed at [facility], [place] [address], [place], [place] [address]          Radiology Studies: Ct Head Wo Contrast  Result Date: 04/29/2019 CLINICAL DATA:  Head trauma, minor, pt on anticoagulation EXAM: CT HEAD WITHOUT CONTRAST TECHNIQUE: Contiguous axial images were obtained from the base of the skull through the vertex without intravenous contrast. COMPARISON:  Head CT and brain MRI March 2018 FINDINGS: Brain: No intracranial hemorrhage, mass effect, or midline shift. Unchanged degree of atrophy and chronic small vessel ischemia from prior exam. No hydrocephalus. The basilar cisterns are  patent. No evidence of territorial infarct or acute ischemia. No extra-axial or intracranial fluid collection. Vascular: No hyperdense vessel. Skull: No fracture or focal lesion. Sinuses/Orbits: Paranasal sinuses and mastoid air cells are clear. The visualized orbits are unremarkable. Bilateral cataract resection. Other: Mild right frontal scalp scarring. IMPRESSION: 1.  No acute intracranial abnormality. 2. Stable degree of atrophy and chronic small vessel ischemia. Electronically Signed   By: Keith Rake M.D.   On: 04/29/2019 22:48   Mr Brain Wo Contrast  Result Date: 04/30/2019 CLINICAL DATA:  Syncopal episodes.  Cardiac etiology favored. EXAM: MRI HEAD WITHOUT CONTRAST TECHNIQUE: Multiplanar, multiecho pulse sequences of the brain and surrounding structures were obtained without intravenous contrast. COMPARISON:  11/13/2016 FINDINGS: Brain: Diffusion imaging does not show any acute or subacute infarction. Brainstem is normal. No focal cerebellar insult. Cerebral hemispheres show an old small vessel akin or infarction in the right thalamus, generalized age related cerebral hemispheric atrophy, and moderate chronic small-vessel ischemic changes of the deep and subcortical white matter, similar to the previous studies. No evidence of large vessel territory infarction, mass lesion, hemorrhage, hydrocephalus or extra-axial collection. Vascular: Major vessels at the base of the brain show flow. Skull and upper cervical spine: Negative Sinuses/Orbits: Clear/normal Other: None IMPRESSION: No acute or reversible finding. No visible change since the study of 2018. Age related atrophy with moderate chronic small-vessel ischemic changes as outlined above. Electronically Signed   By: Nelson Chimes M.D.   On: 04/30/2019 08:53        Scheduled Meds: . Chlorhexidine Gluconate Cloth  6 each Topical Daily  . cholecalciferol  2,000 Units Oral Daily  . diltiazem  10 mg Intravenous Once  . doxazosin  4 mg Oral QHS   . hydrALAZINE  25 mg Oral BID  . influenza vaccine adjuvanted  0.5 mL Intramuscular Tomorrow-1000  . insulin aspart  0-5 Units Subcutaneous QHS  . insulin aspart  0-9 Units Subcutaneous TID WC  . insulin aspart protamine- aspart  22 Units Subcutaneous BID WC  . ketorolac  1 drop Both Eyes QID  . loratadine  10 mg Oral Daily  . losartan  100 mg Oral Daily  . prednisoLONE acetate  1 drop Both Eyes TID  . sodium chloride flush  3 mL Intravenous Q12H   Continuous Infusions: . diltiazem (CARDIZEM) infusion 5 mg/hr (05/01/19 0250)  . heparin 1,450 Units/hr (04/30/19 2305)     LOS: 1 day    Time spent: 35 mins.More than 50% of that time was spent in counseling and/or coordination of care.      Shelly Coss, MD [facility] Pager [phone]  If 7PM-7AM, please contact night-coverage [email or web address] Password [facility] 05/01/2019, 10:32 AM

## 2019-05-01 NOTE — H&P (View-Only) (Signed)
ELECTROPHYSIOLOGY CONSULT NOTE    Patient ID: Marcus Johnston MRN: [id number], DOB/AGE: March 26, 1931 85 y.o.  Admit date: 04/29/2019 Date of Consult: 05/01/2019  Primary Physician: Glendale Chard, MD Primary Cardiologist: Dr Telford Nab Electrophysiologist: New to Dr. Lovena Le  Referring Provider: Dr. Tawanna Solo  Patient Profile: Marcus Johnston is a 85 y.o. male with a history of HTN, HLD, DM2, previous stroke, CKD 3, and atypical atrial flutter who is being seen today for the evaluation of Syncope at the request of Dr. Einar Gip.  HPI:  Marcus Johnston is a 85 y.o. male admitted 04/29/2019 for syncope. He was getting out of car when his wife says he fell to the ground. He wasn't feeling well prior, but can't be more specific. He had 2 additional episodes that day from a seated position. The second two episodes were associated with 5-10 seconds of generalized shaking. It is unclear if he had a post-ictal state.  No incontinence. No recent GI illness. Pt has a history of Afib/AFL on Eliquis.   Pt noted to be persistently bradycardic in the 40s in the ER. Diltiazem and Lopressor stopped.      Unfortunately, pt developed SVTs (?AFL) requiring IV diltiazem. Pt in NSR this am in 70s after HRs in the 140-180s overnight.  He is feeling Ok.  He can feel it when his heart is racing, but describes it as a queasiness in his upper stomach. No pressure in head or neck. He hadn't paid attention to it before.   He clarifies that he was somewhat confused after falling out of the car and hitting his head, but the other two episodes he "snapped out of it" and was alert and oriented immediately afterward.  He reports another episodes several weeks ago where he leaned down to fill the tub, and fell on to his knees, but he does not think he lost consciousness this time.  He denies chest pain, orthopnea, dyspnea, or peripheral edema.   Past Medical History:  Diagnosis Date  . Atypical atrial flutter ([place]) 10/17/2018  .  Diabetes mellitus without complication ([place])   . High cholesterol   . History of prostate cancer   . HTN (hypertension)   . Renal insufficiency      Surgical History:  Past Surgical History:  Procedure Laterality Date  . prostate implant       Medications Prior to Admission  Medication Sig Dispense Refill Last Dose  . albuterol (PROVENTIL HFA;VENTOLIN HFA) 108 (90 Base) MCG/ACT inhaler Inhale 2 puffs into the lungs every 4 (four) hours as needed for wheezing or shortness of breath (cough, shortness of breath or wheezing.). 1 Inhaler 1 Past Month at Unknown time  . apixaban (ELIQUIS) 2.5 MG TABS tablet Take 1 tablet (2.5 mg total) by mouth 2 (two) times daily. 180 tablet 2 04/29/2019 at 1100  . Cholecalciferol (VITAMIN D3) 50 MCG (2000 UT) TABS Take 2,000 Units by mouth daily.   04/29/2019 at Unknown time  . diltiazem (CARDIZEM) 30 MG tablet Take 1 tablet (30 mg total) by mouth every 8 (eight) hours as needed. Palpitations 60 tablet 2 04/28/2019 at Unknown time  . doxazosin (CARDURA) 4 MG tablet Take 1 tablet (4 mg total) by mouth at bedtime. 90 tablet 1 04/28/2019 at Unknown time  . EASY TOUCH INSULIN SYRINGE 30G X 5/16" 0.5 ML MISC USE 3 TIMES DAILY 30 each 4 04/29/2019 at Unknown time  . fexofenadine (ALLEGRA) 180 MG tablet Take 180 mg by mouth daily as needed (allergies.).  Past Month at Unknown time  . HUMALOG MIX 50/50 (50-50) 100 UNIT/ML SUSP injection Inject 22-25 Units into the skin 2 (two) times daily. 25 units in the morning and 22 units in the afternoon   04/29/2019 at am  . hydrALAZINE (APRESOLINE) 25 MG tablet Take 1 tablet (25 mg total) by mouth 2 (two) times daily. 60 tablet 0 04/29/2019 at am  . ketorolac (ACULAR) 0.5 % ophthalmic solution Place 1 drop into both eyes 4 (four) times daily.    04/29/2019 at Unknown time  . losartan-hydrochlorothiazide (HYZAAR) 100-25 MG tablet TAKE 1 TABLET BY MOUTH  DAILY (Patient taking differently: Take 1 tablet by mouth daily. ) 90 tablet 3  04/29/2019 at Unknown time  . metFORMIN (GLUCOPHAGE-XR) 500 MG 24 hr tablet Take 1,000 mg by mouth 2 (two) times daily.    04/29/2019 at am  . metoprolol tartrate (LOPRESSOR) 25 MG tablet TAKE 1 TABLET BY MOUTH  TWICE DAILY (Patient taking differently: Take 25 mg by mouth 2 (two) times daily. ) 180 tablet 3 04/29/2019 at 1100  . prednisoLONE acetate (PRED FORTE) 1 % ophthalmic suspension Place 1 drop into both eyes 3 (three) times daily.   04/29/2019 at Unknown time  . amLODipine (NORVASC) 5 MG tablet TAKE 1 TABLET BY MOUTH  TWICE DAILY (Patient not taking: Reported on 04/29/2019) 180 tablet 3 Not Taking at am    Inpatient Medications:  . Chlorhexidine Gluconate Cloth  6 each Topical Daily  . cholecalciferol  2,000 Units Oral Daily  . diltiazem  10 mg Intravenous Once  . doxazosin  4 mg Oral QHS  . hydrALAZINE  25 mg Oral BID  . influenza vaccine adjuvanted  0.5 mL Intramuscular Tomorrow-1000  . insulin aspart  0-5 Units Subcutaneous QHS  . insulin aspart  0-9 Units Subcutaneous TID WC  . insulin aspart protamine- aspart  22 Units Subcutaneous BID WC  . ketorolac  1 drop Both Eyes QID  . loratadine  10 mg Oral Daily  . losartan  100 mg Oral Daily  . prednisoLONE acetate  1 drop Both Eyes TID  . sodium chloride flush  3 mL Intravenous Q12H    Allergies:  Allergies  Allergen Reactions  . Statins Other (See Comments)    pain    Social History   Socioeconomic History  . Marital status: Married    Spouse name: Not on file  . Number of children: 5  . Years of education: Not on file  . Highest education level: Not on file  Occupational History  . Occupation: retired  Scientific laboratory technician  . Financial resource strain: Not hard at all  . Food insecurity    Worry: Never true    Inability: Never true  . Transportation needs    Medical: No    Non-medical: No  Tobacco Use  . Smoking status: Never Smoker  . Smokeless tobacco: Never Used  Substance and Sexual Activity  . Alcohol use: No  .  Drug use: No  . Sexual activity: Yes  Lifestyle  . Physical activity    Days per week: 0 days    Minutes per session: 0 min  . Stress: Not at all  Relationships  . Social Herbalist on phone: Not on file    Gets together: Not on file    Attends religious service: Not on file    Active member of club or organization: Not on file    Attends meetings of clubs or organizations: Not on  file    Relationship status: Not on file  . Intimate partner violence    Fear of current or ex partner: No    Emotionally abused: No    Physically abused: No    Forced sexual activity: No  Other Topics Concern  . Not on file  Social History Narrative  . Not on file     Family History  Problem Relation Age of Onset  . Cancer Mother   . Cancer Father      Review of Systems: All other systems reviewed and are otherwise negative except as noted above.  Physical Exam: Vitals:   05/01/19 0500 05/01/19 0600 05/01/19 0700 05/01/19 0735  BP: (!) 145/74 139/74 (!) 146/81   Pulse:      Resp: 14 (!) 7 16   Temp:    97.8 F (36.6 C)  TempSrc:    Oral  SpO2: 96% 95% 98%   Weight:      Height:        GEN- The patient is well appearing, alert and oriented x 3 today.   HEENT: normocephalic, atraumatic; sclera clear, conjunctiva pink; hearing intact; oropharynx clear; neck supple Lungs- Clear to ausculation bilaterally, normal work of breathing.  No wheezes, rales, rhonchi Heart- Regular rate and rhythm, no murmurs, rubs or gallops GI- soft, non-tender, non-distended, bowel sounds present Extremities- no clubbing, cyanosis, or edema; DP/PT/radial pulses 2+ bilaterally MS- no significant deformity or atrophy Skin- warm and dry, no rash or lesion Psych- euthymic mood, full affect Neuro- strength and sensation are intact  Labs:   Lab Results  Component Value Date   WBC 6.0 04/29/2019   HGB 13.1 04/29/2019   HCT 40.4 04/29/2019   MCV 85.1 04/29/2019   PLT 184 04/29/2019    Recent  Labs  Lab 04/30/19 0253  NA 138  K 3.7  CL 107  CO2 22  BUN 21  CREATININE 1.43*  CALCIUM 8.9  PROT 6.0*  BILITOT 0.9  ALKPHOS 67  ALT 25  AST 22  GLUCOSE 174*      Radiology/Studies: Ct Head Wo Contrast  Result Date: 04/29/2019 CLINICAL DATA:  Head trauma, minor, pt on anticoagulation EXAM: CT HEAD WITHOUT CONTRAST TECHNIQUE: Contiguous axial images were obtained from the base of the skull through the vertex without intravenous contrast. COMPARISON:  Head CT and brain MRI March 2018 FINDINGS: Brain: No intracranial hemorrhage, mass effect, or midline shift. Unchanged degree of atrophy and chronic small vessel ischemia from prior exam. No hydrocephalus. The basilar cisterns are patent. No evidence of territorial infarct or acute ischemia. No extra-axial or intracranial fluid collection. Vascular: No hyperdense vessel. Skull: No fracture or focal lesion. Sinuses/Orbits: Paranasal sinuses and mastoid air cells are clear. The visualized orbits are unremarkable. Bilateral cataract resection. Other: Mild right frontal scalp scarring. IMPRESSION: 1.  No acute intracranial abnormality. 2. Stable degree of atrophy and chronic small vessel ischemia. Electronically Signed   By: Keith Rake M.D.   On: 04/29/2019 22:48   Mr Brain Wo Contrast  Result Date: 04/30/2019 CLINICAL DATA:  Syncopal episodes.  Cardiac etiology favored. EXAM: MRI HEAD WITHOUT CONTRAST TECHNIQUE: Multiplanar, multiecho pulse sequences of the brain and surrounding structures were obtained without intravenous contrast. COMPARISON:  11/13/2016 FINDINGS: Brain: Diffusion imaging does not show any acute or subacute infarction. Brainstem is normal. No focal cerebellar insult. Cerebral hemispheres show an old small vessel akin or infarction in the right thalamus, generalized age related cerebral hemispheric atrophy, and moderate chronic small-vessel ischemic changes  of the deep and subcortical white matter, similar to the previous  studies. No evidence of large vessel territory infarction, mass lesion, hemorrhage, hydrocephalus or extra-axial collection. Vascular: Major vessels at the base of the brain show flow. Skull and upper cervical spine: Negative Sinuses/Orbits: Clear/normal Other: None IMPRESSION: No acute or reversible finding. No visible change since the study of 2018. Age related atrophy with moderate chronic small-vessel ischemic changes as outlined above. Electronically Signed   By: Nelson Chimes M.D.   On: 04/30/2019 08:53   EKG: Atrial flutter at 109 bpm (personally reviewed) EKG from 11/07/18 reviewed. Shows sinus brady 59 bpm, QRS 100 ms, PR 178 ms  TELEMETRY: NSR 70s this am, SVTs overnight with rates as high as 187 bpm (personally reviewed)  Assessment/Plan: 1.  Atrial flutter, atypical vs SVT/AT Anticoagulated chronically with Eliquis, last dose yesterday am, now on heparin.  CHA2DS2/VASc is 6.  Discussed risks and benefits of watchful waiting with cardiac monitoring, PPM placement, and AFL ablation with the patient. He verbalizes understanding and has no further questions at this time.  Will discuss plan and disposition with Dr. Lovena Le.   2. Syncope History concerning for symptomatic bradycardia, ? In the setting of post-conversion pauses, though no pauses noted thus far this admission.  He is back on IV diltiazem with poorly HRs here in the hospital. He is in NSR this am. No pauses noted overnight back on diltiazem.   For questions or updates, please contact [place] Please consult [email or web address] for contact info under Cardiology/STEMI.  Jacalyn Lefevre, PA-C  05/01/2019 8:00 AM   EP Attending  Patient seen and examined. He has reverted back to NSR. I have reviewed his ECG's from back to February. I think his rhythm is actually atrial tachycardia. Unclear if he actually has 2 different atrial tachycardia's. The long iso-electric interval would suggest atrial tachy and not atrial  flutter but an EP study will be the only way to determine this. For now continue the systemic anti-coagulation. Continue low dose beta blocker and calcium channel blocker and we will schedule an EP study and catheter ablation next week. It is possible that he has syncope due to bradycardia but he did not have long post termination pauses when he converted while he was in sinus rhythm. I would suggest having a loop placed if he passes out again and we do not know why.   Mikle Bosworth.D.

## 2019-05-01 NOTE — Consult Note (Addendum)
ELECTROPHYSIOLOGY CONSULT NOTE    Patient ID: Marcus Johnston MRN: [id number], DOB/AGE: [birth year] 85 y.o.  Admit date: 04/29/2019 Date of Consult: 05/01/2019  Primary Physician: Glendale Chard, MD Primary Cardiologist: Dr Telford Nab Electrophysiologist: New to Dr. Lovena Le  Referring Provider: Dr. Tawanna Solo  Patient Profile: Marcus Johnston is a 85 y.o. male with a history of HTN, HLD, DM2, previous stroke, CKD 3, and atypical atrial flutter who is being seen today for the evaluation of Syncope at the request of Dr. Einar Gip.  HPI:  Marcus Johnston is a 85 y.o. male admitted 04/29/2019 for syncope. He was getting out of car when his wife says he fell to the ground. He wasn't feeling well prior, but can't be more specific. He had 2 additional episodes that day from a seated position. The second two episodes were associated with 5-10 seconds of generalized shaking. It is unclear if he had a post-ictal state.  No incontinence. No recent GI illness. Pt has a history of Afib/AFL on Eliquis.   Pt noted to be persistently bradycardic in the 40s in the ER. Diltiazem and Lopressor stopped.      Unfortunately, pt developed SVTs (?AFL) requiring IV diltiazem. Pt in NSR this am in 70s after HRs in the 140-180s overnight.  He is feeling Ok.  He can feel it when his heart is racing, but describes it as a queasiness in his upper stomach. No pressure in head or neck. He hadn't paid attention to it before.   He clarifies that he was somewhat confused after falling out of the car and hitting his head, but the other two episodes he "snapped out of it" and was alert and oriented immediately afterward.  He reports another episodes several weeks ago where he leaned down to fill the tub, and fell on to his knees, but he does not think he lost consciousness this time.  He denies chest pain, orthopnea, dyspnea, or peripheral edema.   Past Medical History:  Diagnosis Date  . Atypical atrial flutter ([place]) 10/17/2018  .  Diabetes mellitus without complication ([place])   . High cholesterol   . History of prostate cancer   . HTN (hypertension)   . Renal insufficiency      Surgical History:  Past Surgical History:  Procedure Laterality Date  . prostate implant       Medications Prior to Admission  Medication Sig Dispense Refill Last Dose  . albuterol (PROVENTIL HFA;VENTOLIN HFA) 108 (90 Base) MCG/ACT inhaler Inhale 2 puffs into the lungs every 4 (four) hours as needed for wheezing or shortness of breath (cough, shortness of breath or wheezing.). 1 Inhaler 1 Past Month at Unknown time  . apixaban (ELIQUIS) 2.5 MG TABS tablet Take 1 tablet (2.5 mg total) by mouth 2 (two) times daily. 180 tablet 2 04/29/2019 at 1100  . Cholecalciferol (VITAMIN D3) 50 MCG (2000 UT) TABS Take 2,000 Units by mouth daily.   04/29/2019 at Unknown time  . diltiazem (CARDIZEM) 30 MG tablet Take 1 tablet (30 mg total) by mouth every 8 (eight) hours as needed. Palpitations 60 tablet 2 04/28/2019 at Unknown time  . doxazosin (CARDURA) 4 MG tablet Take 1 tablet (4 mg total) by mouth at bedtime. 90 tablet 1 04/28/2019 at Unknown time  . EASY TOUCH INSULIN SYRINGE 30G X 5/16" 0.5 ML MISC USE 3 TIMES DAILY 30 each 4 04/29/2019 at Unknown time  . fexofenadine (ALLEGRA) 180 MG tablet Take 180 mg by mouth daily as needed (allergies.).  Past Month at Unknown time  . HUMALOG MIX 50/50 (50-50) 100 UNIT/ML SUSP injection Inject 22-25 Units into the skin 2 (two) times daily. 25 units in the morning and 22 units in the afternoon   04/29/2019 at am  . hydrALAZINE (APRESOLINE) 25 MG tablet Take 1 tablet (25 mg total) by mouth 2 (two) times daily. 60 tablet 0 04/29/2019 at am  . ketorolac (ACULAR) 0.5 % ophthalmic solution Place 1 drop into both eyes 4 (four) times daily.    04/29/2019 at Unknown time  . losartan-hydrochlorothiazide (HYZAAR) 100-25 MG tablet TAKE 1 TABLET BY MOUTH  DAILY (Patient taking differently: Take 1 tablet by mouth daily. ) 90 tablet 3  04/29/2019 at Unknown time  . metFORMIN (GLUCOPHAGE-XR) 500 MG 24 hr tablet Take 1,000 mg by mouth 2 (two) times daily.    04/29/2019 at am  . metoprolol tartrate (LOPRESSOR) 25 MG tablet TAKE 1 TABLET BY MOUTH  TWICE DAILY (Patient taking differently: Take 25 mg by mouth 2 (two) times daily. ) 180 tablet 3 04/29/2019 at 1100  . prednisoLONE acetate (PRED FORTE) 1 % ophthalmic suspension Place 1 drop into both eyes 3 (three) times daily.   04/29/2019 at Unknown time  . amLODipine (NORVASC) 5 MG tablet TAKE 1 TABLET BY MOUTH  TWICE DAILY (Patient not taking: Reported on 04/29/2019) 180 tablet 3 Not Taking at am    Inpatient Medications:  . Chlorhexidine Gluconate Cloth  6 each Topical Daily  . cholecalciferol  2,000 Units Oral Daily  . diltiazem  10 mg Intravenous Once  . doxazosin  4 mg Oral QHS  . hydrALAZINE  25 mg Oral BID  . influenza vaccine adjuvanted  0.5 mL Intramuscular Tomorrow-1000  . insulin aspart  0-5 Units Subcutaneous QHS  . insulin aspart  0-9 Units Subcutaneous TID WC  . insulin aspart protamine- aspart  22 Units Subcutaneous BID WC  . ketorolac  1 drop Both Eyes QID  . loratadine  10 mg Oral Daily  . losartan  100 mg Oral Daily  . prednisoLONE acetate  1 drop Both Eyes TID  . sodium chloride flush  3 mL Intravenous Q12H    Allergies:  Allergies  Allergen Reactions  . Statins Other (See Comments)    pain    Social History   Socioeconomic History  . Marital status: Married    Spouse name: Not on file  . Number of children: 5  . Years of education: Not on file  . Highest education level: Not on file  Occupational History  . Occupation: retired  Scientific laboratory technician  . Financial resource strain: Not hard at all  . Food insecurity    Worry: Never true    Inability: Never true  . Transportation needs    Medical: No    Non-medical: No  Tobacco Use  . Smoking status: Never Smoker  . Smokeless tobacco: Never Used  Substance and Sexual Activity  . Alcohol use: No  .  Drug use: No  . Sexual activity: Yes  Lifestyle  . Physical activity    Days per week: 0 days    Minutes per session: 0 min  . Stress: Not at all  Relationships  . Social Herbalist on phone: Not on file    Gets together: Not on file    Attends religious service: Not on file    Active member of club or organization: Not on file    Attends meetings of clubs or organizations: Not on  file    Relationship status: Not on file  . Intimate partner violence    Fear of current or ex partner: No    Emotionally abused: No    Physically abused: No    Forced sexual activity: No  Other Topics Concern  . Not on file  Social History Narrative  . Not on file     Family History  Problem Relation Age of Onset  . Cancer Mother   . Cancer Father      Review of Systems: All other systems reviewed and are otherwise negative except as noted above.  Physical Exam: Vitals:   05/01/19 0500 05/01/19 0600 05/01/19 0700 05/01/19 0735  BP: (!) 145/74 139/74 (!) 146/81   Pulse:      Resp: 14 (!) 7 16   Temp:    97.8 F (36.6 C)  TempSrc:    Oral  SpO2: 96% 95% 98%   Weight:      Height:        GEN- The patient is well appearing, alert and oriented x 3 today.   HEENT: normocephalic, atraumatic; sclera clear, conjunctiva pink; hearing intact; oropharynx clear; neck supple Lungs- Clear to ausculation bilaterally, normal work of breathing.  No wheezes, rales, rhonchi Heart- Regular rate and rhythm, no murmurs, rubs or gallops GI- soft, non-tender, non-distended, bowel sounds present Extremities- no clubbing, cyanosis, or edema; DP/PT/radial pulses 2+ bilaterally MS- no significant deformity or atrophy Skin- warm and dry, no rash or lesion Psych- euthymic mood, full affect Neuro- strength and sensation are intact  Labs:   Lab Results  Component Value Date   WBC 6.0 04/29/2019   HGB 13.1 04/29/2019   HCT 40.4 04/29/2019   MCV 85.1 04/29/2019   PLT 184 04/29/2019    Recent  Labs  Lab 04/30/19 0253  NA 138  K 3.7  CL 107  CO2 22  BUN 21  CREATININE 1.43*  CALCIUM 8.9  PROT 6.0*  BILITOT 0.9  ALKPHOS 67  ALT 25  AST 22  GLUCOSE 174*      Radiology/Studies: Ct Head Wo Contrast  Result Date: 04/29/2019 CLINICAL DATA:  Head trauma, minor, pt on anticoagulation EXAM: CT HEAD WITHOUT CONTRAST TECHNIQUE: Contiguous axial images were obtained from the base of the skull through the vertex without intravenous contrast. COMPARISON:  Head CT and brain MRI March 2018 FINDINGS: Brain: No intracranial hemorrhage, mass effect, or midline shift. Unchanged degree of atrophy and chronic small vessel ischemia from prior exam. No hydrocephalus. The basilar cisterns are patent. No evidence of territorial infarct or acute ischemia. No extra-axial or intracranial fluid collection. Vascular: No hyperdense vessel. Skull: No fracture or focal lesion. Sinuses/Orbits: Paranasal sinuses and mastoid air cells are clear. The visualized orbits are unremarkable. Bilateral cataract resection. Other: Mild right frontal scalp scarring. IMPRESSION: 1.  No acute intracranial abnormality. 2. Stable degree of atrophy and chronic small vessel ischemia. Electronically Signed   By: Keith Rake M.D.   On: 04/29/2019 22:48   Mr Brain Wo Contrast  Result Date: 04/30/2019 CLINICAL DATA:  Syncopal episodes.  Cardiac etiology favored. EXAM: MRI HEAD WITHOUT CONTRAST TECHNIQUE: Multiplanar, multiecho pulse sequences of the brain and surrounding structures were obtained without intravenous contrast. COMPARISON:  11/13/2016 FINDINGS: Brain: Diffusion imaging does not show any acute or subacute infarction. Brainstem is normal. No focal cerebellar insult. Cerebral hemispheres show an old small vessel akin or infarction in the right thalamus, generalized age related cerebral hemispheric atrophy, and moderate chronic small-vessel ischemic changes  of the deep and subcortical white matter, similar to the previous  studies. No evidence of large vessel territory infarction, mass lesion, hemorrhage, hydrocephalus or extra-axial collection. Vascular: Major vessels at the base of the brain show flow. Skull and upper cervical spine: Negative Sinuses/Orbits: Clear/normal Other: None IMPRESSION: No acute or reversible finding. No visible change since the study of 2018. Age related atrophy with moderate chronic small-vessel ischemic changes as outlined above. Electronically Signed   By: Nelson Chimes M.D.   On: 04/30/2019 08:53   EKG: Atrial flutter at 109 bpm (personally reviewed) EKG from 11/07/18 reviewed. Shows sinus brady 59 bpm, QRS 100 ms, PR 178 ms  TELEMETRY: NSR 70s this am, SVTs overnight with rates as high as 187 bpm (personally reviewed)  Assessment/Plan: 1.  Atrial flutter, atypical vs SVT/AT Anticoagulated chronically with Eliquis, last dose yesterday am, now on heparin.  CHA2DS2/VASc is 6.  Discussed risks and benefits of watchful waiting with cardiac monitoring, PPM placement, and AFL ablation with the patient. He verbalizes understanding and has no further questions at this time.  Will discuss plan and disposition with Dr. Lovena Le.   2. Syncope History concerning for symptomatic bradycardia, ? In the setting of post-conversion pauses, though no pauses noted thus far this admission.  He is back on IV diltiazem with poorly HRs here in the hospital. He is in NSR this am. No pauses noted overnight back on diltiazem.   For questions or updates, please contact [place] Please consult [email or web address] for contact info under Cardiology/STEMI.  Jacalyn Lefevre, PA-C  05/01/2019 8:00 AM   EP Attending  Patient seen and examined. He has reverted back to NSR. I have reviewed his ECG's from back to February. I think his rhythm is actually atrial tachycardia. Unclear if he actually has 2 different atrial tachycardia's. The long iso-electric interval would suggest atrial tachy and not atrial  flutter but an EP study will be the only way to determine this. For now continue the systemic anti-coagulation. Continue low dose beta blocker and calcium channel blocker and we will schedule an EP study and catheter ablation next week. It is possible that he has syncope due to bradycardia but he did not have long post termination pauses when he converted while he was in sinus rhythm. I would suggest having a loop placed if he passes out again and we do not know why.   Mikle Bosworth.D.

## 2019-05-01 NOTE — Progress Notes (Signed)
Subjective:  Had one episode of tachcyardia around 2 AM  Objective:  Vital Signs in the last 24 hours: Temp:  [97.6 F (36.4 C)-98.4 F (36.9 C)] 97.8 F (36.6 C) (09/14 0735) Pulse Rate:  [68-165] 134 (09/13 1544) Resp:  [7-37] 19 (09/14 0800) BP: (106-182)/(70-105) 164/81 (09/14 0800) SpO2:  [89 %-99 %] 96 % (09/14 0800)  Intake/Output from previous day: 09/13 0701 - 09/14 0700 In: 280.4 [I.V.:280.4] Out: 400 [Urine:400]  Physical Exam  Constitutional: He is oriented to person, place, and time. He appears well-developed and well-nourished. No distress.  HENT:  Head: Normocephalic and atraumatic.  Eyes: Pupils are equal, round, and reactive to light. Conjunctivae are normal.  Neck: No JVD present.  Cardiovascular: Normal rate, regular rhythm and intact distal pulses.  Pulmonary/Chest: Effort normal and breath sounds normal. He has no wheezes. He has no rales.  Abdominal: Soft. Bowel sounds are normal. There is no rebound.  Musculoskeletal:        General: Edema (RLE trace edema) present.  Lymphadenopathy:    He has no cervical adenopathy.  Neurological: He is alert and oriented to person, place, and time. No cranial nerve deficit.  Skin: Skin is warm and dry.  Psychiatric: He has a normal mood and affect.  Nursing note and vitals reviewed.     Lab Results: BMP Recent Labs    03/23/19 1339 04/29/19 2111 04/30/19 0253  NA 140 134* 138  K 4.1 4.1 3.7  CL 104 104 107  CO2 22 18* 22  GLUCOSE 225* 240* 174*  BUN 25 25* 21  CREATININE 1.39* 1.55* 1.43*  CALCIUM 9.8 8.9 8.9  GFRNONAA 46* 40* 44*  GFRAA 53* 46* 51*    CBC Recent Labs  Lab 04/29/19 2111  WBC 6.0  RBC 4.75  HGB 13.1  HCT 40.4  PLT 184  MCV 85.1  MCH 27.6  MCHC 32.4  RDW 15.4  LYMPHSABS 1.0  MONOABS 0.5  EOSABS 0.0  BASOSABS 0.0    HEMOGLOBIN A1C Lab Results  Component Value Date   HGBA1C 7.9 (H) 11/13/2016   MPG 180 11/13/2016    Cardiac Panel (last 3 results) No results for  input(s): CKTOTAL, CKMB, TROPONINI, RELINDX in the last 8760 hours.  BNP (last 3 results) No results for input(s): BNP in the last 8760 hours.  TSH Recent Labs    10/17/18 1429 04/30/19 0253  TSH 2.370 2.437    Lipid Panel     Component Value Date/Time   CHOL 158 03/23/2019 1339   TRIG 72 03/23/2019 1339   HDL 76 03/23/2019 1339   CHOLHDL 2.1 03/23/2019 1339   CHOLHDL 2.3 11/13/2016 0321   VLDL 11 11/13/2016 0321   LDLCALC 68 03/23/2019 1339     Hepatic Function Panel Recent Labs    09/12/18 1032 03/23/19 1339 04/30/19 0253  PROT 6.3 6.5 6.0*  ALBUMIN 4.0 4.1 3.2*  AST _0 ALT _1 ALKPHOS 78 86 67  BILITOT 0.4 0.5 0.9   Cardiovascular studies:  EKG 05/01/2019: Afib w/RVR Inferolateral ST-T changes, possible ischemia  EKG 10/17/2018: Atypical atrial flutter with RVR 160 bpm. Nonspecific ST-T abnormality  Echocardiogram 01/20/2019: Normal LV systolic function with EF 59%. Left ventricle cavity is normal in size. Moderate concentric hypertrophy of the left ventricle. Normal global wall motion. Doppler evidence of grade I (impaired) diastolic dysfunction, normal LAP. Calculated EF 59%. Moderate (Grade II) mitral regurgitation. Moderate tricuspid regurgitation. Estimated pulmonary artery systolic pressure 40 mmHg.  MRI Brain 11/12/2016: 1. No acute intracranial abnormality identified. 2. Moderate chronic microvascular ischemic changes and moderate parenchymal volume loss of the brain. 3. Scattered punctate foci of susceptibility hypointensity compatible with hemosiderin deposition, probably related to chronic hypertension, less likely cerebral amyloid angiopathy. 4. Normal MRA of the head.  Treadmill stress test 08/26/2015: Comments: Indication: Tachycardia The patient exercised according to [facility] Protocol, Total time recorded 6:01 min achieving max heart rate of 164 which was 95 % of MPHR for age and 7.05 METS of work. Normal BP response. Resting  ECG showing tachycardia. @ 105/min. There was no ST-T changes of ischemia with exercise stress test. No arrythmias noted during exercise. Stress terminated due to shortness of breath and THR >85% met.  Assessment & Recommendations:  85 year old African-American male with hypertension, hyperlipidemia, type 2 diabetes mellitus, h/o CVA, CKD 3, paroxysmal atrial flutter/fib, now with recurrent presyncopal episodes.  Presyncope/syncope: Suspect these are related to tachyarrhythmic episodes, or pause after conversion to normal rhythm. Consulted EP. He may benefit from ablation +/- pacemaker if tachy/brady syndrome.   Paroxysmal atrial flutter ([facility]) CHA2DS2VASc score 5, annual stroke risk 7%. Currently on low dose diltiazem drip and heparin drip.   Essential hypertension Suboptimal control. May be able to add beta blocker if he does get a pacemaker.    Nigel Mormon, M.D. 05/01/2019, 8:32 AM [facility] Pager: [phone] Office: [phone] If no answer: [phone]

## 2019-05-01 NOTE — Plan of Care (Signed)
  Problem: Education: Goal: Knowledge of disease or condition will improve Outcome: Progressing Goal: Understanding of medication regimen will improve Outcome: Progressing   Problem: Activity: Goal: Ability to tolerate increased activity will improve Outcome: Progressing   Problem: Cardiac: Goal: Ability to achieve and maintain adequate cardiopulmonary perfusion will improve Outcome: Progressing   

## 2019-05-02 ENCOUNTER — Telehealth: Payer: Self-pay

## 2019-05-02 ENCOUNTER — Other Ambulatory Visit: Payer: Self-pay

## 2019-05-02 ENCOUNTER — Ambulatory Visit (INDEPENDENT_AMBULATORY_CARE_PROVIDER_SITE_OTHER): Payer: Medicare Other | Admitting: Cardiology

## 2019-05-02 ENCOUNTER — Other Ambulatory Visit: Payer: Self-pay | Admitting: Cardiology

## 2019-05-02 ENCOUNTER — Ambulatory Visit: Payer: Medicare Other

## 2019-05-02 DIAGNOSIS — I471 Supraventricular tachycardia: Secondary | ICD-10-CM | POA: Diagnosis not present

## 2019-05-02 DIAGNOSIS — R55 Syncope and collapse: Secondary | ICD-10-CM

## 2019-05-02 DIAGNOSIS — I4719 Other supraventricular tachycardia: Secondary | ICD-10-CM | POA: Insufficient documentation

## 2019-05-02 LAB — CBC
HCT: 36.6 % — ABNORMAL LOW (ref 39.0–52.0)
Hemoglobin: 12.7 g/dL — ABNORMAL LOW (ref 13.0–17.0)
MCH: 28.9 pg (ref 26.0–34.0)
MCHC: 34.7 g/dL (ref 30.0–36.0)
MCV: 83.2 fL (ref 80.0–100.0)
Platelets: 174 10*3/uL (ref 150–400)
RBC: 4.4 MIL/uL (ref 4.22–5.81)
RDW: 15.4 % (ref 11.5–15.5)
WBC: 4.8 10*3/uL (ref 4.0–10.5)
nRBC: 0 % (ref 0.0–0.2)

## 2019-05-02 LAB — BASIC METABOLIC PANEL
Anion gap: 14 (ref 5–15)
BUN: 18 mg/dL (ref 8–23)
CO2: 20 mmol/L — ABNORMAL LOW (ref 22–32)
Calcium: 9 mg/dL (ref 8.9–10.3)
Chloride: 105 mmol/L (ref 98–111)
Creatinine, Ser: 1.21 mg/dL (ref 0.61–1.24)
GFR calc Af Amer: >60 mL/min (ref >60)
GFR calc non Af Amer: 54 mL/min — ABNORMAL LOW (ref >60)
Glucose, Bld: 102 mg/dL — ABNORMAL HIGH (ref 70–99)
Potassium: 3.9 mmol/L (ref 3.5–5.1)
Sodium: 139 mmol/L (ref 135–145)

## 2019-05-02 LAB — GLUCOSE, CAPILLARY: Glucose-Capillary: 119 mg/dL — ABNORMAL HIGH (ref 70–99)

## 2019-05-02 MED ORDER — DILTIAZEM HCL ER COATED BEADS 120 MG PO CP24: 120.0000 mg | ORAL_CAPSULE | Freq: Every day | ORAL | 2 refills | Status: DC

## 2019-05-02 MED FILL — CARTIA XT 120 MG CP24: 120 | 30 days supply | Qty: 30 | Fill #0

## 2019-05-02 NOTE — Discharge Summary (Signed)
Physician Discharge Summary  ELMA WYDRA [id number] DOB: [birth year] DOA: 04/29/2019  PCP: Glendale Chard, MD  Admit date: 04/29/2019 Discharge date: 05/02/2019  Admitted From: Home Disposition:  Home  Discharge Condition:Stable CODE STATUS:FULL Diet recommendation: Heart Healthy  Brief/Interim Summary: Patient is 85 year old male with history of atrial fibrillation, diabetes, hypertension, TIA, hyperlipidemia, CKD stage III, history of prostate cancer when he developed altered mental status while driving with his wife.  Patient reported of a fall striking his head.  After that he had 2 episodes of syncopal.  Patient then presented to the emergency department.  During hospitalization. he was found to have possible Tachy-brady syndrome.  Cardiology was following.  EP consulted  And he has been plan for follow-up as an outpatient in a week.  Cardiology planning for 30-day event monitoring.  He has been started on calcium channel blocker.  Patient is hemodynamically stable for discharge home today.  Following problems were addressed during his hospitalization:  Multiple syncopal episodes: Cardiology following.  No syncopal episodes after admission.  Suspicion for underlying dysrrhythmia.  Patient seen by physical therapy, no recommendation offered.  Paroxysmal A. fib: CHA2DS2VASc score 5.Currently in normal sinus rhythm.  Follows with cardiology.  He was initially started  on low-dose Cardizem drip and heparin.  He went into tachycardia alternating with sinus bradycardia.  Concern for tachy-bradycardia syndrome.  EP consulted for possible PPM.EP consulted  And he has been plan for follow-up as an outpatient in a week.  Cardiology planning for 30-day event monitoring.  He has been started on calcium channel blocker.    Diabetes: Continue home regimen   Hypertension: Continue current medicines.  CKD stage III: Currently kidney function at baseline.  Obesity: BMI of  30.12.    Discharge Diagnoses:  Principal Problem:   Syncope and collapse Active Problems:   history of TIA (transient ischemic attack)   Essential hypertension   Type 2 diabetes mellitus with other circulatory complications (HCC)   Atypical atrial flutter (HCC)   Bradycardia   Syncope   Sick sinus syndrome [facility])    Discharge Instructions  Discharge Instructions    Diet - low sodium heart healthy   Complete by: As directed    Increase activity slowly   Complete by: As directed      Allergies as of 05/02/2019      Reactions   Statins Other (See Comments)   pain      Medication List    STOP taking these medications   amLODipine 5 MG tablet Commonly known as: NORVASC     TAKE these medications   albuterol 108 (90 Base) MCG/ACT inhaler Commonly known as: VENTOLIN HFA Inhale 2 puffs into the lungs every 4 (four) hours as needed for wheezing or shortness of breath (cough, shortness of breath or wheezing.).   apixaban 2.5 MG Tabs tablet Commonly known as: Eliquis Take 1 tablet (2.5 mg total) by mouth 2 (two) times daily.   diltiazem 120 MG 24 hr capsule Commonly known as: CARDIZEM CD Take 1 capsule (120 mg total) by mouth daily.   diltiazem 30 MG tablet Commonly known as: Cardizem Take 1 tablet (30 mg total) by mouth every 8 (eight) hours as needed. Palpitations   doxazosin 4 MG tablet Commonly known as: CARDURA Take 1 tablet (4 mg total) by mouth at bedtime.   Easy Touch Insulin Syringe 30G X 5/16" 0.5 ML Misc Generic drug: Insulin Syringe-Needle U-100 USE 3 TIMES DAILY   fexofenadine 180 MG tablet Commonly known as:  ALLEGRA Take 180 mg by mouth daily as needed (allergies.).   HumaLOG Mix 50/50 (50-50) 100 UNIT/ML Susp injection Generic drug: insulin lispro protamine-lispro Inject 22-25 Units into the skin 2 (two) times daily. 25 units in the morning and 22 units in the afternoon   hydrALAZINE 25 MG tablet Commonly known as: APRESOLINE Take 1 tablet  (25 mg total) by mouth 2 (two) times daily.   ketorolac 0.5 % ophthalmic solution Commonly known as: ACULAR Place 1 drop into both eyes 4 (four) times daily.   losartan-hydrochlorothiazide 100-25 MG tablet Commonly known as: HYZAAR TAKE 1 TABLET BY MOUTH  DAILY   metFORMIN 500 MG 24 hr tablet Commonly known as: GLUCOPHAGE-XR Take 1,000 mg by mouth 2 (two) times daily.   metoprolol tartrate 25 MG tablet Commonly known as: LOPRESSOR TAKE 1 TABLET BY MOUTH  TWICE DAILY   prednisoLONE acetate 1 % ophthalmic suspension Commonly known as: PRED FORTE Place 1 drop into both eyes 3 (three) times daily.   Vitamin D3 50 MCG (2000 UT) Tabs Take 2,000 Units by mouth daily.      Follow-up Information    [place] Follow up on 05/05/2019.   Why: at 0930 for COVID testing. Let the drive through know you are there for pre-procedure testing. Contact information: [place] [phone] [place] Follow up on 05/08/2019.   Why: Report to the [place] [facility] admitting at 0530 am for procedure at 0730 am. Nothing to eat/drink after midnight. NO medications that am.  Contact information: [place] SSN-[id number] [phone]         Allergies  Allergen Reactions  . Statins Other (See Comments)    pain    Consultations:  Cardiology,EP   Procedures/Studies: Ct Head Wo Contrast  Result Date: 04/29/2019 CLINICAL DATA:  Head trauma, minor, pt on anticoagulation EXAM: CT HEAD WITHOUT CONTRAST TECHNIQUE: Contiguous axial images were obtained from the base of the skull through the vertex without intravenous contrast. COMPARISON:  Head CT and brain MRI March 2018 FINDINGS: Brain: No intracranial hemorrhage, mass effect, or midline shift. Unchanged degree of atrophy and chronic small vessel ischemia from prior exam. No hydrocephalus. The basilar cisterns are patent.  No evidence of territorial infarct or acute ischemia. No extra-axial or intracranial fluid collection. Vascular: No hyperdense vessel. Skull: No fracture or focal lesion. Sinuses/Orbits: Paranasal sinuses and mastoid air cells are clear. The visualized orbits are unremarkable. Bilateral cataract resection. Other: Mild right frontal scalp scarring. IMPRESSION: 1.  No acute intracranial abnormality. 2. Stable degree of atrophy and chronic small vessel ischemia. Electronically Signed   By: Keith Rake M.D.   On: 04/29/2019 22:48   Mr Brain Wo Contrast  Result Date: 04/30/2019 CLINICAL DATA:  Syncopal episodes.  Cardiac etiology favored. EXAM: MRI HEAD WITHOUT CONTRAST TECHNIQUE: Multiplanar, multiecho pulse sequences of the brain and surrounding structures were obtained without intravenous contrast. COMPARISON:  11/13/2016 FINDINGS: Brain: Diffusion imaging does not show any acute or subacute infarction. Brainstem is normal. No focal cerebellar insult. Cerebral hemispheres show an old small vessel akin or infarction in the right thalamus, generalized age related cerebral hemispheric atrophy, and moderate chronic small-vessel ischemic changes of the deep and subcortical white matter, similar to the previous studies. No evidence of large vessel territory infarction, mass lesion, hemorrhage, hydrocephalus or extra-axial collection. Vascular: Major vessels at the base of the brain show flow. Skull  and upper cervical spine: Negative Sinuses/Orbits: Clear/normal Other: None IMPRESSION: No acute or reversible finding. No visible change since the study of 2018. Age related atrophy with moderate chronic small-vessel ischemic changes as outlined above. Electronically Signed   By: Nelson Chimes M.D.   On: 04/30/2019 08:53       Subjective:  Patient seen and examined the bedside this morning.  Hemodynamically stable for discharge. Discharge Exam: Vitals:   05/02/19 0824 05/02/19 0825  BP: 139/69 139/69  Pulse:     Resp:    Temp:    SpO2:     Vitals:   05/02/19 0800 05/02/19 0821 05/02/19 0824 05/02/19 0825  BP:  139/69 139/69 139/69  Pulse:  79    Resp: 18     Temp:      TempSrc:      SpO2:      Weight:      Height:        General: Pt is alert, awake, not in acute distress Cardiovascular: RRR, S1/S2 +, no rubs, no gallops Respiratory: CTA bilaterally, no wheezing, no rhonchi Abdominal: Soft, NT, ND, bowel sounds + Extremities: no edema, no cyanosis    The results of significant diagnostics from this hospitalization (including imaging, microbiology, ancillary and laboratory) are listed below for reference.     Microbiology: Recent Results (from the past 240 hour(s))  SARS CORONAVIRUS 2 (TAT 6-24 HRS) Nasopharyngeal Nasopharyngeal Swab     Status: None   Collection Time: 04/29/19 11:04 PM   Specimen: Nasopharyngeal Swab  Result Value Ref Range Status   SARS Coronavirus 2 NEGATIVE NEGATIVE Final    Comment: (NOTE) SARS-CoV-2 target nucleic acids are NOT DETECTED. The SARS-CoV-2 RNA is generally detectable in upper and lower respiratory specimens during the acute phase of infection. Negative results do not preclude SARS-CoV-2 infection, do not rule out co-infections with other pathogens, and should not be used as the sole basis for treatment or other patient management decisions. Negative results must be combined with clinical observations, patient history, and epidemiological information. The expected result is Negative. Fact Sheet for Patients: SugarRoll.be Fact Sheet for Healthcare Providers: https://www.woods-mathews.com/ This test is not yet approved or cleared by the [place] FDA and  has been authorized for detection and/or diagnosis of SARS-CoV-2 by FDA under an Emergency Use Authorization (EUA). This EUA will remain  in effect (meaning this test can be used) for the duration of the COVID-19 declaration under Section  56 4(b)(1) of the Act, 21 U.S.C. section 360bbb-3(b)(1), unless the authorization is terminated or revoked sooner. Performed at [facility], [place] [address] [place], [place] [address]   Surgical PCR screen     Status: None   Collection Time: 04/30/19  6:13 PM   Specimen: Nasal Mucosa; Nasal Swab  Result Value Ref Range Status   MRSA, PCR NEGATIVE NEGATIVE Final   Staphylococcus aureus NEGATIVE NEGATIVE Final    Comment: (NOTE) The Xpert SA Assay (FDA approved for NASAL specimens in patients 76 years of age and older), is one component of a comprehensive surveillance program. It is not intended to diagnose infection nor to guide or monitor treatment. Performed at [facility], [place] [address] [place], [place] [address]      Labs: BNP (last 3 results) No results for input(s): BNP in the last 8760 hours. Basic Metabolic Panel: Recent Labs  Lab 04/29/19 2111 04/30/19 0253 05/02/19 0251  NA 134* 138 139  K 4.1 3.7 3.9  CL 104 107  105  CO2 18* 22 20*  GLUCOSE 240* 174* 102*  BUN 25* 21 18  CREATININE 1.55* 1.43* 1.21  CALCIUM 8.9 8.9 9.0   Liver Function Tests: Recent Labs  Lab 04/30/19 0253  AST 22  ALT 25  ALKPHOS 67  BILITOT 0.9  PROT 6.0*  ALBUMIN 3.2*   No results for input(s): LIPASE, AMYLASE in the last 168 hours. No results for input(s): AMMONIA in the last 168 hours. CBC: Recent Labs  Lab 04/29/19 2111 05/01/19 0741 05/02/19 0630  WBC 6.0 6.5 4.8  NEUTROABS 4.4  --   --   HGB 13.1 13.3 12.7*  HCT 40.4 40.4 36.6*  MCV 85.1 83.8 83.2  PLT 184 179 174   Cardiac Enzymes: No results for input(s): CKTOTAL, CKMB, CKMBINDEX, TROPONINI in the last 168 hours. BNP: Invalid input(s): POCBNP CBG: Recent Labs  Lab 05/01/19 0641 05/01/19 1308 05/01/19 1553 05/01/19 2125 05/02/19 0635  GLUCAP 80 212* 282* 159* 119*   D-Dimer No results for input(s): DDIMER in the last 72 hours. Hgb A1c Recent Labs    04/30/19 0253  HGBA1C 7.9*    Lipid Profile No results for input(s): CHOL, HDL, LDLCALC, TRIG, CHOLHDL, LDLDIRECT in the last 72 hours. Thyroid function studies Recent Labs    04/30/19 0253  TSH 2.437   Anemia work up No results for input(s): VITAMINB12, FOLATE, FERRITIN, TIBC, IRON, RETICCTPCT in the last 72 hours. Urinalysis    Component Value Date/Time   BILIRUBINUR negative 03/23/2019 1639   PROTEINUR Positive (A) 03/23/2019 1639   UROBILINOGEN 0.2 03/23/2019 1639   NITRITE negative 03/23/2019 1639   LEUKOCYTESUR Negative 03/23/2019 1639   Sepsis Labs Invalid input(s): PROCALCITONIN,  WBC,  LACTICIDVEN Microbiology Recent Results (from the past 240 hour(s))  SARS CORONAVIRUS 2 (TAT 6-24 HRS) Nasopharyngeal Nasopharyngeal Swab     Status: None   Collection Time: 04/29/19 11:04 PM   Specimen: Nasopharyngeal Swab  Result Value Ref Range Status   SARS Coronavirus 2 NEGATIVE NEGATIVE Final    Comment: (NOTE) SARS-CoV-2 target nucleic acids are NOT DETECTED. The SARS-CoV-2 RNA is generally detectable in upper and lower respiratory specimens during the acute phase of infection. Negative results do not preclude SARS-CoV-2 infection, do not rule out co-infections with other pathogens, and should not be used as the sole basis for treatment or other patient management decisions. Negative results must be combined with clinical observations, patient history, and epidemiological information. The expected result is Negative. Fact Sheet for Patients: SugarRoll.be Fact Sheet for Healthcare Providers: https://www.woods-mathews.com/ This test is not yet approved or cleared by the [place] FDA and  has been authorized for detection and/or diagnosis of SARS-CoV-2 by FDA under an Emergency Use Authorization (EUA). This EUA will remain  in effect (meaning this test can be used) for the duration of the COVID-19 declaration under Section 56 4(b)(1) of the Act, 21  U.S.C. section 360bbb-3(b)(1), unless the authorization is terminated or revoked sooner. Performed at [facility], [place] [address], [place],  [address]   Surgical PCR screen     Status: None   Collection Time: 04/30/19  6:13 PM   Specimen: Nasal Mucosa; Nasal Swab  Result Value Ref Range Status   MRSA, PCR NEGATIVE NEGATIVE Final   Staphylococcus aureus NEGATIVE NEGATIVE Final    Comment: (NOTE) The Xpert SA Assay (FDA approved for NASAL specimens in patients 4 years of age and older), is one component of a comprehensive surveillance program. It is not intended to diagnose  infection nor to guide or monitor treatment. Performed at [facility], [place] [address] [place], [place] [address]     Please note: You were cared for by a hospitalist during your hospital stay. Once you are discharged, your primary care physician will handle any further medical issues. Please note that NO REFILLS for any discharge medications will be authorized once you are discharged, as it is imperative that you return to your primary care physician (or establish a relationship with a primary care physician if you do not have one) for your post hospital discharge needs so that they can reassess your need for medications and monitor your lab values.    Time coordinating discharge: 40 minutes  SIGNED:   Shelly Coss, MD  [facility] 05/02/2019, 10:18 AM Pager [phone]  If 7PM-7AM, please contact night-coverage [email or web address] Password [email or web address]

## 2019-05-02 NOTE — Progress Notes (Signed)
Patient was in the office today to have event monitor placed.  Patient is wife mentioned that patient had an episode earlier today where he was confused for brief 5 seconds, associated with sudden shakiness.  He did not lose consciousness/bowel or bladder tone.  Patient discharged earlier today.  His episodes have been thought to be syncope/presyncope related to atrial tachycardia.  No focal neuro deficits seen on exam and imaging.  Low suspicion for stroke/seizure.  I recommended patient to wear the event monitor.  He scheduled to undergo ablation next week.  Manish Esther Hardy, MD Elmo Putt and he knew checking that [place] so I can enter my note [facility]. [place] Pager: [phone] Office: [phone] If no answer Cell [phone]

## 2019-05-02 NOTE — Plan of Care (Signed)
  Problem: Education: Goal: Ability to describe self-care measures that may prevent or decrease complications (Diabetes Survival Skills Education) will improve Outcome: Progressing Goal: Individualized Educational Video(s) Outcome: Progressing   Problem: Fluid Volume: Goal: Ability to maintain a balanced intake and output will improve Outcome: Progressing   Problem: Health Behavior/Discharge Planning: Goal: Ability to identify and utilize available resources and services will improve Outcome: Progressing Goal: Ability to manage health-related needs will improve Outcome: Progressing   Problem: Metabolic: Goal: Ability to maintain appropriate glucose levels will improve Outcome: Progressing   Problem: Nutritional: Goal: Progress toward achieving an optimal weight will improve Outcome: Progressing   Problem: Skin Integrity: Goal: Risk for impaired skin integrity will decrease Outcome: Progressing   Problem: Tissue Perfusion: Goal: Adequacy of tissue perfusion will improve Outcome: Progressing   Problem: Education: Goal: Knowledge of General Education information will improve Description: Including pain rating scale, medication(s)/side effects and non-pharmacologic comfort measures Outcome: Progressing   Problem: Health Behavior/Discharge Planning: Goal: Ability to manage health-related needs will improve Outcome: Progressing   Problem: Clinical Measurements: Goal: Ability to maintain clinical measurements within normal limits will improve Outcome: Progressing Goal: Will remain free from infection Outcome: Progressing Goal: Diagnostic test results will improve Outcome: Progressing Goal: Respiratory complications will improve Outcome: Progressing Goal: Cardiovascular complication will be avoided Outcome: Progressing   Problem: Activity: Goal: Risk for activity intolerance will decrease Outcome: Progressing   Problem: Nutrition: Goal: Adequate nutrition will be  maintained Outcome: Progressing   Problem: Coping: Goal: Level of anxiety will decrease Outcome: Progressing   Problem: Elimination: Goal: Will not experience complications related to bowel motility Outcome: Progressing Goal: Will not experience complications related to urinary retention Outcome: Progressing   Problem: Pain Managment: Goal: General experience of comfort will improve Outcome: Progressing   Problem: Safety: Goal: Ability to remain free from injury will improve Outcome: Progressing   Problem: Skin Integrity: Goal: Risk for impaired skin integrity will decrease Outcome: Progressing   Problem: Education: Goal: Knowledge of disease or condition will improve Outcome: Progressing Goal: Understanding of medication regimen will improve Outcome: Progressing Goal: Individualized Educational Video(s) Outcome: Progressing   Problem: Activity: Goal: Ability to tolerate increased activity will improve Outcome: Progressing   Problem: Cardiac: Goal: Ability to achieve and maintain adequate cardiopulmonary perfusion will improve Outcome: Progressing   Problem: Health Behavior/Discharge Planning: Goal: Ability to safely manage health-related needs after discharge will improve Outcome: Progressing

## 2019-05-02 NOTE — Progress Notes (Signed)
Subjective:  No tachy or brady arrhthymias overnight  Objective:  Vital Signs in the last 24 hours: Temp:  [97.4 F (36.3 C)-98.3 F (36.8 C)] 98.3 F (36.8 C) (09/15 0738) Pulse Rate:  [72-79] 79 (09/15 0821) Resp:  [12-32] 18 (09/15 0800) BP: (95-164)/(42-87) 139/69 (09/15 0825) SpO2:  [95 %-98 %] 97 % (09/14 2000)  Intake/Output from previous day: 09/14 0701 - 09/15 0700 In: 320.9 [P.O.:240; I.V.:80.9] Out: -   Physical Exam  Constitutional: He is oriented to person, place, and time. He appears well-developed and well-nourished. No distress.  HENT:  Head: Normocephalic and atraumatic.  Eyes: Pupils are equal, round, and reactive to light. Conjunctivae are normal.  Neck: No JVD present.  Cardiovascular: Normal rate, regular rhythm and intact distal pulses.  Pulmonary/Chest: Effort normal and breath sounds normal. He has no wheezes. He has no rales.  Abdominal: Soft. Bowel sounds are normal. There is no rebound.  Musculoskeletal:        General: Edema (RLE trace edema) present.  Lymphadenopathy:    He has no cervical adenopathy.  Neurological: He is alert and oriented to person, place, and time. No cranial nerve deficit.  Skin: Skin is warm and dry.  Psychiatric: He has a normal mood and affect.  Nursing note and vitals reviewed.     Lab Results: BMP Recent Labs    04/29/19 2111 04/30/19 0253 05/02/19 0251  NA 134* 138 139  K 4.1 3.7 3.9  CL 104 107 105  CO2 18* 22 20*  GLUCOSE 240* 174* 102*  BUN 25* 21 18  CREATININE 1.55* 1.43* 1.21  CALCIUM 8.9 8.9 9.0  GFRNONAA 40* 44* 54*  GFRAA 46* 51* >60    CBC Recent Labs  Lab 04/29/19 2111  05/02/19 0630  WBC 6.0   < > 4.8  RBC 4.75   < > 4.40  HGB 13.1   < > 12.7*  HCT 40.4   < > 36.6*  PLT 184   < > 174  MCV 85.1   < > 83.2  MCH 27.6   < > 28.9  MCHC 32.4   < > 34.7  RDW 15.4   < > 15.4  LYMPHSABS 1.0  --   --   MONOABS 0.5  --   --   EOSABS 0.0  --   --   BASOSABS 0.0  --   --    < > = values  in this interval not displayed.    HEMOGLOBIN A1C Lab Results  Component Value Date   HGBA1C 7.9 (H) 04/30/2019   MPG 180 04/30/2019    Cardiac Panel (last 3 results) No results for input(s): CKTOTAL, CKMB, TROPONINI, RELINDX in the last 8760 hours.  BNP (last 3 results) No results for input(s): BNP in the last 8760 hours.  TSH Recent Labs    10/17/18 1429 04/30/19 0253  TSH 2.370 2.437    Lipid Panel     Component Value Date/Time   CHOL 158 03/23/2019 1339   TRIG 72 03/23/2019 1339   HDL 76 03/23/2019 1339   CHOLHDL 2.1 03/23/2019 1339   CHOLHDL 2.3 11/13/2016 0321   VLDL 11 11/13/2016 0321   LDLCALC 68 03/23/2019 1339     Hepatic Function Panel Recent Labs    09/12/18 1032 03/23/19 1339 04/30/19 0253  PROT 6.3 6.5 6.0*  ALBUMIN 4.0 4.1 3.2*  AST 18 13 22   ALT 11 9 25   ALKPHOS 78 86 67  BILITOT 0.4 0.5 0.9  Cardiovascular studies:  EKG 05/01/2019: Afib w/RVR Inferolateral ST-T changes, possible ischemia  EKG 10/17/2018: Atypical atrial flutter with RVR 160 bpm. Nonspecific ST-T abnormality  Echocardiogram 01/20/2019: Normal LV systolic function with EF 59%. Left ventricle cavity is normal in size. Moderate concentric hypertrophy of the left ventricle. Normal global wall motion. Doppler evidence of grade I (impaired) diastolic dysfunction, normal LAP. Calculated EF 59%. Moderate (Grade II) mitral regurgitation. Moderate tricuspid regurgitation. Estimated pulmonary artery systolic pressure 40 mmHg.  MRI Brain 11/12/2016: 1. No acute intracranial abnormality identified. 2. Moderate chronic microvascular ischemic changes and moderate parenchymal volume loss of the brain. 3. Scattered punctate foci of susceptibility hypointensity compatible with hemosiderin deposition, probably related to chronic hypertension, less likely cerebral amyloid angiopathy. 4. Normal MRA of the head.  Treadmill stress test 08/26/2015: Comments: Indication: Tachycardia  The patient exercised according to Bruce Protocol, Total time recorded 6:01 min achieving max heart rate of 164 which was 95 % of MPHR for age and 7.05 METS of work. Normal BP response. Resting ECG showing tachycardia. @ 105/min. There was no ST-T changes of ischemia with exercise stress test. No arrythmias noted during exercise. Stress terminated due to shortness of breath and THR >85% met.  Assessment & Recommendations:  85 year old African-American male with hypertension, hyperlipidemia, type 2 diabetes mellitus, h/o CVA, CKD 3, paroxysmal atrial flutter/fib, now with recurrent presyncopal episodes.  Presyncope/syncope: Suspect these are related to tachyarrhythmic episodes, or pause after conversion to normal rhythm. Consulted EP. Plan for atrial tachycardia ablation next week. Will arrange outpatient event monitor till then.  Paroxysmal atrial tachycardia: Previously thought to be atrial flutter or fibrillation. He may not need long term anticoagulation. CHA2DS2VASc score 5, annual stroke risk 7%. Continue eliquis 5 mg bid for now.   Added diltiazem 120 mg daily.  Continue metoprolol tartarate 25 mg bid.   Essential hypertension Better controlled today.   Nigel Mormon, M.D. 05/02/2019, 10:28 AM [facility] Pager: [phone] Office: [phone] If no answer: [phone]

## 2019-05-02 NOTE — Progress Notes (Signed)
Tele reviewed. Rates stable.   Follow up and procedure scheduled.   Electrophysiology team to see as needed while here. Please call with questions.   Shirley Friar, PA-C  Pager: [phone]  05/02/2019 8:10 AM

## 2019-05-03 ENCOUNTER — Telehealth: Payer: Self-pay | Admitting: Cardiology

## 2019-05-03 ENCOUNTER — Encounter: Payer: Self-pay | Admitting: Cardiology

## 2019-05-03 DIAGNOSIS — I471 Supraventricular tachycardia: Secondary | ICD-10-CM

## 2019-05-03 NOTE — Telephone Encounter (Signed)
Received notification regarding critical event monitor alert earlier today. See media attachment in a separate note. Appears atrial tachycardia at 190 bpm with 4 aberrant conducted beats. I have been unable to reach the patient on his only available number. Will try again.  Vernell Leep, MD

## 2019-05-03 NOTE — Progress Notes (Signed)
Event monitor alert 9/16

## 2019-05-04 NOTE — Telephone Encounter (Signed)
I spoke with the patient this morning. He had one more episode of what appears to atrial tachycardia, on 9/16 around 11 AM. Patient took diltiazem 30 mg with improvement in his HR. He has not had any syncopal episode. Continue to monitor. Discussed vagal maneuvers and use of diltiazem. If palpitation remain sustained, or he has presyncope/syncope occurs, patient know to immediately seek help.   Vernell Leep, MD

## 2019-05-05 ENCOUNTER — Other Ambulatory Visit (HOSPITAL_COMMUNITY): Payer: Self-pay

## 2019-05-05 ENCOUNTER — Other Ambulatory Visit (HOSPITAL_COMMUNITY)
Admission: RE | Admit: 2019-05-05 | Discharge: 2019-05-05 | Disposition: A | Discharge status: Home / Self Care | Payer: Medicare Other | Source: Ambulatory Visit | Attending: Internal Medicine | Admitting: Internal Medicine

## 2019-05-05 DIAGNOSIS — I471 Supraventricular tachycardia: Secondary | ICD-10-CM | POA: Diagnosis not present

## 2019-05-05 DIAGNOSIS — Z20828 Contact with and (suspected) exposure to other viral communicable diseases: Secondary | ICD-10-CM | POA: Diagnosis not present

## 2019-05-05 DIAGNOSIS — Z01812 Encounter for preprocedural laboratory examination: Secondary | ICD-10-CM | POA: Diagnosis not present

## 2019-05-06 LAB — NOVEL CORONAVIRUS, NAA (HOSP ORDER, SEND-OUT TO REF LAB; TAT 18-24 HRS): SARS-CoV-2, NAA: NOT DETECTED

## 2019-05-08 ENCOUNTER — Other Ambulatory Visit: Payer: Self-pay

## 2019-05-08 ENCOUNTER — Encounter (HOSPITAL_COMMUNITY)
Admission: RE | Disposition: A | Discharge status: Home / Self Care | Payer: Medicare Other | Source: Home / Self Care | Attending: Internal Medicine

## 2019-05-08 ENCOUNTER — Ambulatory Visit (HOSPITAL_COMMUNITY)
Admission: RE | Admit: 2019-05-08 | Discharge: 2019-05-09 | Disposition: A | Discharge status: Home / Self Care | Payer: Medicare Other | Attending: Internal Medicine | Admitting: Internal Medicine

## 2019-05-08 DIAGNOSIS — Z7901 Long term (current) use of anticoagulants: Secondary | ICD-10-CM | POA: Insufficient documentation

## 2019-05-08 DIAGNOSIS — E1122 Type 2 diabetes mellitus with diabetic chronic kidney disease: Secondary | ICD-10-CM | POA: Insufficient documentation

## 2019-05-08 DIAGNOSIS — Z791 Long term (current) use of non-steroidal anti-inflammatories (NSAID): Secondary | ICD-10-CM | POA: Diagnosis not present

## 2019-05-08 DIAGNOSIS — R55 Syncope and collapse: Secondary | ICD-10-CM | POA: Insufficient documentation

## 2019-05-08 DIAGNOSIS — N183 Chronic kidney disease, stage 3 (moderate): Secondary | ICD-10-CM | POA: Diagnosis not present

## 2019-05-08 DIAGNOSIS — Z888 Allergy status to other drugs, medicaments and biological substances status: Secondary | ICD-10-CM | POA: Insufficient documentation

## 2019-05-08 DIAGNOSIS — Z8673 Personal history of transient ischemic attack (TIA), and cerebral infarction without residual deficits: Secondary | ICD-10-CM | POA: Insufficient documentation

## 2019-05-08 DIAGNOSIS — I129 Hypertensive chronic kidney disease with stage 1 through stage 4 chronic kidney disease, or unspecified chronic kidney disease: Secondary | ICD-10-CM | POA: Diagnosis not present

## 2019-05-08 DIAGNOSIS — Z79899 Other long term (current) drug therapy: Secondary | ICD-10-CM | POA: Diagnosis not present

## 2019-05-08 DIAGNOSIS — I4719 Other supraventricular tachycardia: Secondary | ICD-10-CM | POA: Diagnosis present

## 2019-05-08 DIAGNOSIS — E7800 Pure hypercholesterolemia, unspecified: Secondary | ICD-10-CM | POA: Diagnosis not present

## 2019-05-08 DIAGNOSIS — Z8546 Personal history of malignant neoplasm of prostate: Secondary | ICD-10-CM | POA: Insufficient documentation

## 2019-05-08 DIAGNOSIS — Z794 Long term (current) use of insulin: Secondary | ICD-10-CM | POA: Diagnosis not present

## 2019-05-08 DIAGNOSIS — I471 Supraventricular tachycardia, unspecified: Secondary | ICD-10-CM | POA: Diagnosis present

## 2019-05-08 DIAGNOSIS — E785 Hyperlipidemia, unspecified: Secondary | ICD-10-CM | POA: Insufficient documentation

## 2019-05-08 HISTORY — DX: Unspecified atrial fibrillation: I48.91

## 2019-05-08 HISTORY — PX: ATRIAL TACH ABLATION: EP1192

## 2019-05-08 LAB — GLUCOSE, CAPILLARY
Glucose-Capillary: 121 mg/dL — ABNORMAL HIGH (ref 70–99)
Glucose-Capillary: 100 mg/dL — ABNORMAL HIGH (ref 70–99)
Glucose-Capillary: 217 mg/dL — ABNORMAL HIGH (ref 70–99)
Glucose-Capillary: 140 mg/dL — ABNORMAL HIGH (ref 70–99)

## 2019-05-08 LAB — POCT ACTIVATED CLOTTING TIME: Activated Clotting Time: 175 seconds

## 2019-05-08 SURGERY — ATRIAL TACH ABLATION

## 2019-05-08 MED ORDER — VITAMIN D 25 MCG (1000 UNIT) PO TABS
2000.0000 [IU] | ORAL_TABLET | Freq: Every day | ORAL | Status: DC
  Administered 2019-05-08 – 2019-05-09 (×2): 2000 [IU] via ORAL
  Filled 2019-05-08 (×2): qty 2

## 2019-05-08 MED ORDER — HYDROCHLOROTHIAZIDE 25 MG PO TABS
25.0000 mg | ORAL_TABLET | Freq: Every day | ORAL | Status: DC
  Administered 2019-05-08 – 2019-05-09 (×2): 25 mg via ORAL
  Filled 2019-05-08 (×2): qty 1

## 2019-05-08 MED ORDER — BUPIVACAINE HCL (PF) 0.25 % IJ SOLN
INTRAMUSCULAR | Status: AC
  Filled 2019-05-08: qty 60

## 2019-05-08 MED ORDER — HEPARIN SODIUM (PORCINE) 1000 UNIT/ML IJ SOLN
INTRAMUSCULAR | Status: DC | PRN
  Administered 2019-05-08: 7000 [IU] via INTRAVENOUS

## 2019-05-08 MED ORDER — HYDRALAZINE HCL 20 MG/ML IJ SOLN
INTRAMUSCULAR | Status: AC
  Filled 2019-05-08: qty 1

## 2019-05-08 MED ORDER — LOSARTAN POTASSIUM 50 MG PO TABS
100.0000 mg | ORAL_TABLET | Freq: Every day | ORAL | Status: DC
  Administered 2019-05-08 – 2019-05-09 (×2): 100 mg via ORAL
  Filled 2019-05-08 (×2): qty 2

## 2019-05-08 MED ORDER — INSULIN ASPART PROT & ASPART (70-30 MIX) 100 UNIT/ML ~~LOC~~ SUSP
25.0000 [IU] | Freq: Every day | SUBCUTANEOUS | Status: DC
  Administered 2019-05-09: 25 [IU] via SUBCUTANEOUS
  Filled 2019-05-08: qty 10

## 2019-05-08 MED ORDER — NON FORMULARY: Freq: Two times a day (BID) | Status: DC

## 2019-05-08 MED ORDER — SODIUM CHLORIDE 0.9% FLUSH
3.0000 mL | Freq: Two times a day (BID) | INTRAVENOUS | Status: DC
  Administered 2019-05-08 – 2019-05-09 (×2): 3 mL via INTRAVENOUS

## 2019-05-08 MED ORDER — HEPARIN SODIUM (PORCINE) 1000 UNIT/ML IJ SOLN
INTRAMUSCULAR | Status: AC
  Filled 2019-05-08: qty 1

## 2019-05-08 MED ORDER — LOSARTAN POTASSIUM-HCTZ 100-25 MG PO TABS: 1.0000 | ORAL_TABLET | Freq: Every day | ORAL | Status: DC

## 2019-05-08 MED ORDER — FENTANYL CITRATE (PF) 100 MCG/2ML IJ SOLN
INTRAMUSCULAR | Status: AC
  Filled 2019-05-08: qty 2

## 2019-05-08 MED ORDER — METFORMIN HCL ER 500 MG PO TB24
1000.0000 mg | ORAL_TABLET | Freq: Two times a day (BID) | ORAL | Status: DC
  Administered 2019-05-08 – 2019-05-09 (×2): 1000 mg via ORAL
  Filled 2019-05-08 (×2): qty 2

## 2019-05-08 MED ORDER — HEPARIN (PORCINE) IN NACL 1000-0.9 UT/500ML-% IV SOLN
INTRAVENOUS | Status: AC
  Filled 2019-05-08: qty 500

## 2019-05-08 MED ORDER — PREDNISOLONE ACETATE 1 % OP SUSP
1.0000 [drp] | Freq: Three times a day (TID) | OPHTHALMIC | Status: DC
  Administered 2019-05-08 – 2019-05-09 (×3): 1 [drp] via OPHTHALMIC
  Filled 2019-05-08: qty 5

## 2019-05-08 MED ORDER — FENTANYL CITRATE (PF) 100 MCG/2ML IJ SOLN
INTRAMUSCULAR | Status: DC | PRN
  Administered 2019-05-08: 25 ug via INTRAVENOUS
  Administered 2019-05-08 (×5): 12.5 ug via INTRAVENOUS
  Administered 2019-05-08: 25 ug via INTRAVENOUS

## 2019-05-08 MED ORDER — DOXAZOSIN MESYLATE 4 MG PO TABS
4.0000 mg | ORAL_TABLET | Freq: Every day | ORAL | Status: DC
  Administered 2019-05-08: 4 mg via ORAL
  Filled 2019-05-08: qty 1

## 2019-05-08 MED ORDER — HEPARIN (PORCINE) IN NACL 1000-0.9 UT/500ML-% IV SOLN
INTRAVENOUS | Status: DC | PRN
  Administered 2019-05-08 (×2): 500 mL

## 2019-05-08 MED ORDER — SODIUM CHLORIDE 0.9% FLUSH: 3.0000 mL | INTRAVENOUS | Status: DC | PRN

## 2019-05-08 MED ORDER — INSULIN ASPART PROT & ASPART (70-30 MIX) 100 UNIT/ML ~~LOC~~ SUSP
22.0000 [IU] | Freq: Every day | SUBCUTANEOUS | Status: DC
  Administered 2019-05-08: 17:00:00 22 [IU] via SUBCUTANEOUS
  Filled 2019-05-08: qty 10

## 2019-05-08 MED ORDER — HYDRALAZINE HCL 20 MG/ML IJ SOLN
10.0000 mg | Freq: Once | INTRAMUSCULAR | Status: AC
  Administered 2019-05-08: 11:00:00 10 mg via INTRAVENOUS

## 2019-05-08 MED ORDER — ONDANSETRON HCL 4 MG/2ML IJ SOLN: 4.0000 mg | Freq: Four times a day (QID) | INTRAMUSCULAR | Status: DC | PRN

## 2019-05-08 MED ORDER — ACETAMINOPHEN 325 MG PO TABS
650.0000 mg | ORAL_TABLET | ORAL | Status: DC | PRN
  Administered 2019-05-08 – 2019-05-09 (×2): 650 mg via ORAL
  Filled 2019-05-08 (×2): qty 2

## 2019-05-08 MED ORDER — MIDAZOLAM HCL 5 MG/5ML IJ SOLN
INTRAMUSCULAR | Status: DC | PRN
  Administered 2019-05-08 (×7): 1 mg via INTRAVENOUS
  Administered 2019-05-08: 2 mg via INTRAVENOUS

## 2019-05-08 MED ORDER — OFF THE BEAT BOOK
Freq: Once | Status: AC
  Administered 2019-05-08: 20:00:00
  Filled 2019-05-08: qty 1

## 2019-05-08 MED ORDER — ALBUTEROL SULFATE (2.5 MG/3ML) 0.083% IN NEBU: 2.5000 mg | INHALATION_SOLUTION | RESPIRATORY_TRACT | Status: DC | PRN

## 2019-05-08 MED ORDER — SODIUM CHLORIDE 0.9 % IV SOLN
INTRAVENOUS | Status: DC
  Administered 2019-05-08: 06:00:00 via INTRAVENOUS

## 2019-05-08 MED ORDER — BUPIVACAINE HCL (PF) 0.25 % IJ SOLN
INTRAMUSCULAR | Status: DC | PRN
  Administered 2019-05-08: 45 mL

## 2019-05-08 MED ORDER — KETOROLAC TROMETHAMINE 0.5 % OP SOLN
1.0000 [drp] | Freq: Four times a day (QID) | OPHTHALMIC | Status: DC
  Administered 2019-05-08 – 2019-05-09 (×3): 1 [drp] via OPHTHALMIC
  Filled 2019-05-08: qty 5

## 2019-05-08 MED ORDER — MIDAZOLAM HCL 5 MG/5ML IJ SOLN
INTRAMUSCULAR | Status: AC
  Filled 2019-05-08: qty 5

## 2019-05-08 MED ORDER — HYDRALAZINE HCL 25 MG PO TABS
25.0000 mg | ORAL_TABLET | Freq: Two times a day (BID) | ORAL | Status: DC
  Administered 2019-05-08 – 2019-05-09 (×3): 25 mg via ORAL
  Filled 2019-05-08 (×3): qty 1

## 2019-05-08 MED ORDER — SODIUM CHLORIDE 0.9 % IV SOLN: 250.0000 mL | INTRAVENOUS | Status: DC | PRN

## 2019-05-08 MED ORDER — METOPROLOL TARTRATE 25 MG PO TABS
25.0000 mg | ORAL_TABLET | Freq: Two times a day (BID) | ORAL | Status: DC
  Administered 2019-05-08 – 2019-05-09 (×2): 25 mg via ORAL
  Filled 2019-05-08 (×2): qty 1

## 2019-05-08 SURGICAL SUPPLY — 12 items
BAG SNAP BAND KOVER 36X36 (MISCELLANEOUS) ×2 IMPLANT
CATH EZ STEER NAV 4MM D-F CUR (ABLATOR) ×2 IMPLANT
CATH JOSEPH QUAD ALLRED 6F REP (CATHETERS) ×2 IMPLANT
CATH WEBSTER BI DIR CS D-F CRV (CATHETERS) ×2 IMPLANT
PACK EP LATEX FREE (CUSTOM PROCEDURE TRAY) ×3
PACK EP LF (CUSTOM PROCEDURE TRAY) ×1 IMPLANT
PAD PRO RADIOLUCENT 2001M-C (PAD) ×3 IMPLANT
PATCH CARTO3 (PAD) ×2 IMPLANT
SHEATH PINNACLE 6F 10CM (SHEATH) ×2 IMPLANT
SHEATH PINNACLE 7F 10CM (SHEATH) ×2 IMPLANT
SHEATH PINNACLE 8F 10CM (SHEATH) ×4 IMPLANT
SHIELD RADPAD SCOOP 12X17 (MISCELLANEOUS) ×2 IMPLANT

## 2019-05-08 NOTE — Interval H&P Note (Signed)
History and Physical Interval Note:  05/08/2019 7:27 AM  Marcus Johnston  has presented today for surgery, with the diagnosis of Atrial Tach.  The various methods of treatment have been discussed with the patient and family. After consideration of risks, benefits and other options for treatment, the patient has consented to  Procedure(s): ATRIAL TACH ABLATION (N/A) as a surgical intervention.  The patient's history has been reviewed, patient examined, no change in status, stable for surgery.  I have reviewed the patient's chart and labs.  Questions were answered to the patient's satisfaction.     Cristopher Peru

## 2019-05-08 NOTE — Progress Notes (Signed)
Right femoral 8Fr sheath pulled at 11:15 followed by 6,7,8 venous sheaths right femoral at 11:30.  Manual pressure held x 30 minutes.  Groin soft no s/s of hematoma or bleeding.  Pt instructed on post sheath care.

## 2019-05-08 NOTE — Plan of Care (Signed)
  Problem: Activity: Goal: Ability to return to baseline activity level will improve Outcome: Completed/Met   Problem: Cardiac: Goal: Ability to maintain adequate cardiovascular perfusion will improve Outcome: Completed/Met Goal: Vascular access site(s) Level 0-1 will be maintained Outcome: Completed/Met

## 2019-05-08 NOTE — Discharge Summary (Addendum)
ELECTROPHYSIOLOGY PROCEDURE DISCHARGE SUMMARY    Patient ID: Marcus Johnston,  MRN: [id number], DOB/AGE: 1931-07-05 85 y.o.  Admit date: 05/08/2019 Discharge date: 05/09/2019  Primary Care Physician: Glendale Chard, MD Primary Cardiologist: Dr. Virgina Jock Electrophysiologist; Dr. Lovena Le    Primary Discharge Diagnosis:  Active Problems:   Atrial tachycardia ([place])    Secondary Diagnosis (PMHx) 1. HTN 2. HLD 3. DM 4. H/o stroke 5. CKD (III)    Allergies  Allergen Reactions  . Statins Other (See Comments)    pain    Procedures This Admission:  05/08/2019 Procedure performed: Invasive EP study and catheter ablation of a focal atrial tachycardia utilizing three-dimensional electro-anatomic  Preoperative diagnosis: Atrial tachycardia with variable AV conduction Postoperative diagnosis: Same as preoperative diagnosis Complications: There were no immediate procedural complications   Brief HPI: DELONTAE Johnston is a 85 y.o. male with a history of HTN, HLD, DM2, previous stroke, CKD 3, and atypical atrial flutter  admitted 04/29/2019 for syncope.  HR was in the 40s in the ER. Diltiazem and Lopressor stopped.   pt developed SVTs, of unclear etiology, Dr. Lovena Le suspected perhaps an AT perhaps two and recommended undergo EPS/ablation for better identification of his tachycardia and treatment.  Discharged 05/02/2019.  He was brought back to [facility] yesterday for this procedure    Hospital Course:  The patient was brought in out patient underwent EPS ablation with details noted above.  He was monitored on telemetry overnight noting nocturnal SB 50's, daytime SR 60's Wound/site care and activity retrictions and instructions were discussed with the patient.  He feels well, no CP or SOB, no site discomfort. He was seen and examined day of discharge by Dr. Lovena Le and felt to be stable for discharge.  EP follow up is in place.  The patient's EPS did not disclose AFlutter.  Dr. Lovena Le  d/w Dr. Virgina Jock and recommends stopping his Eliquis.  Dr. Lovena Le d/w the patient this AM as well.    Physical Exam: Vitals:   05/08/19 1626 05/08/19 2041 05/09/19 0511 05/09/19 0520  BP: (!) 155/79 126/66 (!) 145/65   Pulse: 62 64 62   Resp:  16 16   Temp:  97.8 F (36.6 C) 98.1 F (36.7 C)   TempSrc:  Oral Oral   SpO2:  96% 97%   Weight:    107.3 kg  Height:         GEN- The patient is well appearing, alert and oriented x 3 today.   HEENT: normocephalic, atraumatic; sclera clear, conjunctiva pink; hearing intact; oropharynx clear; neck supple, no JVP Lymph- no cervical lymphadenopathy Lungs- CTA b/l, normal work of breathing.  No wheezes, rales, rhonchi Heart- RRR, no murmurs, rubs or gallops, PMI not laterally displaced GI- soft, non-tender, non-distended Extremities- no clubbing, cyanosis, or edema;R groin site is stable, no hematoma, no bleeding, soft and non tenderDP/PT 2+ bilaterally MS- no significant deformity or atrophy Skin- warm and dry, no rash or lesion Psych- euthymic mood, full affect Neuro- strength and sensation are intact    Labs:   Lab Results  Component Value Date   WBC 4.8 05/02/2019   HGB 12.7 (L) 05/02/2019   HCT 36.6 (L) 05/02/2019   MCV 83.2 05/02/2019   PLT 174 05/02/2019    No results for input(s): NA, K, CL, CO2, BUN, CREATININE, CALCIUM, PROT, BILITOT, ALKPHOS, ALT, AST, GLUCOSE in the last 168 hours.  Invalid input(s): LABALBU   Discharge Medications:  Allergies as of 05/09/2019  Reactions   Statins Other (See Comments)   pain      Medication List    STOP taking these medications   apixaban 2.5 MG Tabs tablet Commonly known as: Eliquis     TAKE these medications   albuterol 108 (90 Base) MCG/ACT inhaler Commonly known as: VENTOLIN HFA Inhale 2 puffs into the lungs every 4 (four) hours as needed for wheezing or shortness of breath (cough, shortness of breath or wheezing.).   doxazosin 4 MG tablet Commonly known as:  CARDURA Take 1 tablet (4 mg total) by mouth at bedtime.   Easy Touch Insulin Syringe 30G X 5/16" 0.5 ML Misc Generic drug: Insulin Syringe-Needle U-100 USE 3 TIMES DAILY   fexofenadine 180 MG tablet Commonly known as: ALLEGRA Take 180 mg by mouth daily as needed (allergies.).   HumaLOG Mix 50/50 (50-50) 100 UNIT/ML Susp injection Generic drug: insulin lispro protamine-lispro Inject 22-25 Units into the skin 2 (two) times daily. 25 units in the morning and 22 units in the afternoon   hydrALAZINE 25 MG tablet Commonly known as: APRESOLINE Take 1 tablet (25 mg total) by mouth 2 (two) times daily.   ketorolac 0.5 % ophthalmic solution Commonly known as: ACULAR Place 1 drop into both eyes 4 (four) times daily.   losartan-hydrochlorothiazide 100-25 MG tablet Commonly known as: HYZAAR TAKE 1 TABLET BY MOUTH  DAILY   metFORMIN 500 MG 24 hr tablet Commonly known as: GLUCOPHAGE-XR Take 1,000 mg by mouth 2 (two) times daily.   metoprolol tartrate 25 MG tablet Commonly known as: LOPRESSOR TAKE 1 TABLET BY MOUTH  TWICE DAILY   prednisoLONE acetate 1 % ophthalmic suspension Commonly known as: PRED FORTE Place 1 drop into both eyes 3 (three) times daily.   Vitamin D3 50 MCG (2000 UT) Tabs Take 2,000 Units by mouth daily.       Disposition: Pt is being discharged home today in good condition. Discharge Instructions    Diet - low sodium heart healthy   Complete by: As directed    Increase activity slowly   Complete by: As directed      Follow-up Information    [name], MD Follow up.   Specialty: Cardiology Why: 06/15/2019 @ 12:30PM Contact information: [id number] [id number] [phone]           Duration of Discharge Encounter: Greater than 30 minutes including physician time.  [name], PA-C 05/09/2019 8:11 AM   EP Attending  Patient seen and examined. Agree with the findings as noted above. The patient has  been stable overnight after catheter ablation of atrial tachycardia originating from the para-hisian region. Groin is without hematoma and tele demonstrates NSR. He will be discharged home off of anti-coagulation as he was placed on this after his tachy was thought to be due to atrial flutter. He had no inducible flutter despite pacing down to 200 ms.  Mikle Bosworth.D.

## 2019-05-08 NOTE — Discharge Instructions (Signed)
Post procedure care instructions No driving for 4 days. No lifting over 5 lbs for 1 week. No vigorous or sexual activity for 1 week. You may return to work on 05/16/2019. Keep procedure site clean & dry. If you notice increased pain, swelling, bleeding or pus, call/return!  You may shower, but no soaking baths/hot tubs/pools for 1 week.

## 2019-05-09 ENCOUNTER — Other Ambulatory Visit: Payer: Self-pay

## 2019-05-09 ENCOUNTER — Encounter (HOSPITAL_COMMUNITY): Payer: Self-pay | Admitting: Internal Medicine

## 2019-05-09 DIAGNOSIS — Z7901 Long term (current) use of anticoagulants: Secondary | ICD-10-CM | POA: Diagnosis not present

## 2019-05-09 DIAGNOSIS — I471 Supraventricular tachycardia: Secondary | ICD-10-CM | POA: Diagnosis not present

## 2019-05-09 DIAGNOSIS — Z791 Long term (current) use of non-steroidal anti-inflammatories (NSAID): Secondary | ICD-10-CM | POA: Diagnosis not present

## 2019-05-09 DIAGNOSIS — Z794 Long term (current) use of insulin: Secondary | ICD-10-CM | POA: Diagnosis not present

## 2019-05-09 DIAGNOSIS — E785 Hyperlipidemia, unspecified: Secondary | ICD-10-CM | POA: Diagnosis not present

## 2019-05-09 DIAGNOSIS — Z79899 Other long term (current) drug therapy: Secondary | ICD-10-CM | POA: Diagnosis not present

## 2019-05-09 DIAGNOSIS — Z8673 Personal history of transient ischemic attack (TIA), and cerebral infarction without residual deficits: Secondary | ICD-10-CM | POA: Diagnosis not present

## 2019-05-09 DIAGNOSIS — E7800 Pure hypercholesterolemia, unspecified: Secondary | ICD-10-CM | POA: Diagnosis not present

## 2019-05-09 DIAGNOSIS — Z888 Allergy status to other drugs, medicaments and biological substances status: Secondary | ICD-10-CM | POA: Diagnosis not present

## 2019-05-09 DIAGNOSIS — N183 Chronic kidney disease, stage 3 (moderate): Secondary | ICD-10-CM | POA: Diagnosis not present

## 2019-05-09 DIAGNOSIS — E1122 Type 2 diabetes mellitus with diabetic chronic kidney disease: Secondary | ICD-10-CM | POA: Diagnosis not present

## 2019-05-09 DIAGNOSIS — R55 Syncope and collapse: Secondary | ICD-10-CM | POA: Diagnosis not present

## 2019-05-09 DIAGNOSIS — I129 Hypertensive chronic kidney disease with stage 1 through stage 4 chronic kidney disease, or unspecified chronic kidney disease: Secondary | ICD-10-CM | POA: Diagnosis not present

## 2019-05-09 LAB — GLUCOSE, CAPILLARY: Glucose-Capillary: 93 mg/dL (ref 70–99)

## 2019-05-11 NOTE — Telephone Encounter (Signed)
error 

## 2019-05-15 ENCOUNTER — Ambulatory Visit (INDEPENDENT_AMBULATORY_CARE_PROVIDER_SITE_OTHER): Payer: Medicare Other | Admitting: Internal Medicine

## 2019-05-15 ENCOUNTER — Encounter: Payer: Self-pay | Admitting: Internal Medicine

## 2019-05-15 ENCOUNTER — Other Ambulatory Visit: Payer: Self-pay

## 2019-05-15 VITALS — BP 152/96 | HR 78 | Temp 98.3°F | Ht 75.0 in | Wt 241.8 lb

## 2019-05-15 DIAGNOSIS — M25511 Pain in right shoulder: Secondary | ICD-10-CM

## 2019-05-15 DIAGNOSIS — I129 Hypertensive chronic kidney disease with stage 1 through stage 4 chronic kidney disease, or unspecified chronic kidney disease: Secondary | ICD-10-CM

## 2019-05-15 DIAGNOSIS — N182 Chronic kidney disease, stage 2 (mild): Secondary | ICD-10-CM | POA: Diagnosis not present

## 2019-05-15 DIAGNOSIS — I495 Sick sinus syndrome: Secondary | ICD-10-CM

## 2019-05-15 DIAGNOSIS — Z6830 Body mass index (BMI) 30.0-30.9, adult: Secondary | ICD-10-CM

## 2019-05-15 DIAGNOSIS — E6609 Other obesity due to excess calories: Secondary | ICD-10-CM

## 2019-05-15 NOTE — Patient Instructions (Signed)
Shoulder Pain Many things can cause shoulder pain, including:  An injury.  Moving the shoulder in the same way again and again (overuse).  Joint pain (arthritis). Pain can come from:  Swelling and irritation (inflammation) of any part of the shoulder.  An injury to the shoulder joint.  An injury to: ? Tissues that connect muscle to bone (tendons). ? Tissues that connect bones to each other (ligaments). ? Bones. Follow these instructions at home: Watch for changes in your symptoms. Let your doctor know about them. Follow these instructions to help with your pain. If you have a sling:  Wear the sling as told by your doctor. Remove it only as told by your doctor.  Loosen the sling if your fingers: ? Tingle. ? Become numb. ? Turn cold and blue.  Keep the sling clean.  If the sling is not waterproof: ? Do not let it get wet. ? Take the sling off when you shower or bathe. Managing pain, stiffness, and swelling   If told, put ice on the painful area: ? Put ice in a plastic bag. ? Place a towel between your skin and the bag. ? Leave the ice on for 20 minutes, 2-3 times a day. Stop putting ice on if it does not help with the pain.  Squeeze a soft ball or a foam pad as much as possible. This prevents swelling in the shoulder. It also helps to strengthen the arm. General instructions  Take over-the-counter and prescription medicines only as told by your doctor.  Keep all follow-up visits as told by your doctor. This is important. Contact a doctor if:  Your pain gets worse.  Medicine does not help your pain.  You have new pain in your arm, hand, or fingers. Get help right away if:  Your arm, hand, or fingers: ? Tingle. ? Are numb. ? Are swollen. ? Are painful. ? Turn white or blue. Summary  Shoulder pain can be caused by many things. These include injury, moving the shoulder in the same away again and again, and joint pain.  Watch for changes in your symptoms.  Let your doctor know about them.  This condition may be treated with a sling, ice, and pain medicine.  Contact your doctor if the pain gets worse or you have new pain. Get help right away if your arm, hand, or fingers tingle or get numb, swollen, or painful.  Keep all follow-up visits as told by your doctor. This is important. This information is not intended to replace advice given to you by your health care provider. Make sure you discuss any questions you have with your health care provider. Document Released: 01/20/2008 Document Revised: 02/15/2018 Document Reviewed: 02/15/2018 Elsevier Patient Education  2020 [employer]  

## 2019-05-16 NOTE — Progress Notes (Signed)
Subjective:     Patient ID: Marcus Johnston , male    DOB: 02-18-1931 , 85 y.o.   MRN: [id number]   Chief Complaint  Patient presents with  . Follow-up    b/p    HPI  Hypertension This is a chronic problem. The current episode started more than 1 year ago. The problem has been gradually improving since onset. The problem is controlled. Pertinent negatives include no blurred vision, chest pain, palpitations or shortness of breath. The current treatment provides moderate improvement. Compliance problems include exercise.      Past Medical History:  Diagnosis Date  . Atrial fibrillation ([place])   . Atypical atrial flutter ([place]) 10/17/2018  . Diabetes mellitus without complication ([place])   . High cholesterol   . History of prostate cancer   . HTN (hypertension)   . Renal insufficiency      Family History  Problem Relation Age of Onset  . Cancer Mother   . Cancer Father      Current Outpatient Medications:  .  albuterol (PROVENTIL HFA;VENTOLIN HFA) 108 (90 Base) MCG/ACT inhaler, Inhale 2 puffs into the lungs every 4 (four) hours as needed for wheezing or shortness of breath (cough, shortness of breath or wheezing.)., Disp: 1 Inhaler, Rfl: 1 .  Cholecalciferol (VITAMIN D3) 50 MCG (2000 UT) TABS, Take 2,000 Units by mouth daily., Disp: , Rfl:  .  doxazosin (CARDURA) 4 MG tablet, Take 1 tablet (4 mg total) by mouth at bedtime., Disp: 90 tablet, Rfl: 1 .  EASY TOUCH INSULIN SYRINGE 30G X 5/16" 0.5 ML MISC, USE 3 TIMES DAILY, Disp: 30 each, Rfl: 4 .  fexofenadine (ALLEGRA) 180 MG tablet, Take 180 mg by mouth daily as needed (allergies.). , Disp: , Rfl:  .  HUMALOG MIX 50/50 (50-50) 100 UNIT/ML SUSP injection, Inject 22-25 Units into the skin 2 (two) times daily. 25 units in the morning and 22 units in the afternoon, Disp: , Rfl:  .  hydrALAZINE (APRESOLINE) 25 MG tablet, Take 1 tablet (25 mg total) by mouth 2 (two) times daily., Disp: 60 tablet, Rfl: 0 .  ketorolac (ACULAR) 0.5 %  ophthalmic solution, Place 1 drop into both eyes 4 (four) times daily. , Disp: , Rfl:  .  losartan-hydrochlorothiazide (HYZAAR) 100-25 MG tablet, TAKE 1 TABLET BY MOUTH  DAILY (Patient taking differently: Take 1 tablet by mouth daily. ), Disp: 90 tablet, Rfl: 3 .  metFORMIN (GLUCOPHAGE-XR) 500 MG 24 hr tablet, Take 1,000 mg by mouth 2 (two) times daily. , Disp: , Rfl:  .  metoprolol tartrate (LOPRESSOR) 25 MG tablet, TAKE 1 TABLET BY MOUTH  TWICE DAILY (Patient taking differently: Take 25 mg by mouth 2 (two) times daily. ), Disp: 180 tablet, Rfl: 3 .  prednisoLONE acetate (PRED FORTE) 1 % ophthalmic suspension, Place 1 drop into both eyes 3 (three) times daily., Disp: , Rfl:    Allergies  Allergen Reactions  . Statins Other (See Comments)    pain     Review of Systems  Constitutional: Negative.   Eyes: Negative for blurred vision.  Respiratory: Negative.  Negative for shortness of breath.   Cardiovascular: Negative.  Negative for chest pain and palpitations.  Gastrointestinal: Negative.   Musculoskeletal: Positive for arthralgias.       He c/o r shoulder pain. He reports he fell onto his right shoulder when he had his syncopal episode.   Neurological: Negative.   Psychiatric/Behavioral: Negative.      Today's Vitals   05/15/19  1451  BP: (!) 152/96  Pulse: 78  Temp: 98.3 F (36.8 C)  TempSrc: Oral  Weight: 241 lb 12.8 oz (109.7 kg)  Height: 6\' 3"  (1.905 m)   Body mass index is 30.22 kg/m.   Objective:  Physical Exam Vitals signs and nursing note reviewed.  Constitutional:      Appearance: Normal appearance.  Cardiovascular:     Rate and Rhythm: Normal rate and regular rhythm.     Heart sounds: Normal heart sounds.  Pulmonary:     Effort: Pulmonary effort is normal.     Breath sounds: Normal breath sounds.  Musculoskeletal:     Right shoulder: He exhibits tenderness.  Skin:    General: Skin is warm.  Neurological:     General: No focal deficit present.     Mental  Status: He is alert.  Psychiatric:        Mood and Affect: Mood normal.         Assessment And Plan:     1. Hypertensive nephropathy  Chronic, uncontrolled. He is scheduled to see Cardiology later this week. I suggest increasing hydralazine to 50mg  twice daily; however, I will defer to them for medication adjustment. He is encouraged to avoid adding salt to his foods and reminded that breads and cheeses can be high in sodium as well.   2. Chronic renal disease, stage II  Chronic. Importance of adequate hydration was discussed with the patient.   3. Sinus brady-tachy syndrome ([place])  His recent hospital admission/discharge summary was reviewed in full detail during his visit. He is no longer anticoagulated since afib has been ruled out. He has been advised pacemaker placement is not needed at this time.   4. Acute pain of right shoulder  He is advised to apply topical pain cream to affected area two to three times daily as needed. He will let me know if his sx persist.   5. Class 1 obesity due to excess calories with serious comorbidity and body mass index (BMI) of 30.0 to 30.9 in adult  Importance of regular exercise was discussed with the patient. He is encouraged to gradually work up to 30 minutes five days weekly.   [name], MD    THE PATIENT IS ENCOURAGED TO PRACTICE SOCIAL DISTANCING DUE TO THE COVID-19 PANDEMIC.

## 2019-05-17 ENCOUNTER — Other Ambulatory Visit: Payer: Self-pay

## 2019-05-17 ENCOUNTER — Ambulatory Visit: Payer: Medicare Other | Admitting: Cardiology

## 2019-05-17 ENCOUNTER — Encounter: Payer: Self-pay | Admitting: Cardiology

## 2019-05-17 VITALS — BP 140/64 | HR 68 | Ht 75.0 in | Wt 240.0 lb

## 2019-05-17 DIAGNOSIS — I471 Supraventricular tachycardia: Secondary | ICD-10-CM

## 2019-05-17 DIAGNOSIS — I10 Essential (primary) hypertension: Secondary | ICD-10-CM

## 2019-05-17 DIAGNOSIS — R1909 Other intra-abdominal and pelvic swelling, mass and lump: Secondary | ICD-10-CM | POA: Diagnosis not present

## 2019-05-17 DIAGNOSIS — E1165 Type 2 diabetes mellitus with hyperglycemia: Secondary | ICD-10-CM | POA: Diagnosis not present

## 2019-05-17 DIAGNOSIS — R0989 Other specified symptoms and signs involving the circulatory and respiratory systems: Secondary | ICD-10-CM

## 2019-05-17 NOTE — Progress Notes (Signed)
Subjective:   Marcus Johnston, male    DOB: 07/10/1931, 85 y.o.   MRN: [id number]   Chief Complaint  Patient presents with  . Tachycardia    follow up  . Loss of Consciousness   HPI  85 year old African-American male with hypertension, hyperlipidemia, type 2 diabetes mellitus, h/o CVA, CKD 3, atrial tachycardia status post ablation.  Patient's tachyarrhythmia was previously thought to be atrial flutter.  However on his recent recurrences, EP consult was sought.  His tachyarrhythmia was in fact atrial flutter with rapid conduction.  He underwent successful ablation on 05/08/2019.  Given that his rhythm was not atrial flutter, anticoagulation was stopped.  He has been doing well since the procedure, without any further palpitations.  However, he  noticed a knot on 9/29 in his right groin at the site of procedure sheath insertion. He denies any pain at the site.   His blood pressure was elevated yesterday at PCP visit, but better controlled today.    Past Medical History:  Diagnosis Date  . Atrial fibrillation ([place])   . Atypical atrial flutter ([place]) 10/17/2018  . Diabetes mellitus without complication ([place])   . High cholesterol   . History of prostate cancer   . HTN (hypertension)   . Renal insufficiency      Past Surgical History:  Procedure Laterality Date  . ATRIAL TACH ABLATION N/A 05/08/2019   Procedure: ATRIAL TACH ABLATION;  Surgeon: [name], MD;  Location: [place] [facility];  Service: Cardiovascular;  Laterality: N/A;  . prostate implant       Social History   Socioeconomic History  . Marital status: Married    Spouse name: Not on file  . Number of children: 5  . Years of education: Not on file  . Highest education level: Not on file  Occupational History  . Occupation: retired  Scientific laboratory technician  . Financial resource strain: Not hard at all  . Food insecurity    Worry: Never true    Inability: Never true  . Transportation needs    Medical: No   Non-medical: No  Tobacco Use  . Smoking status: Never Smoker  . Smokeless tobacco: Never Used  Substance and Sexual Activity  . Alcohol use: No  . Drug use: No  . Sexual activity: Yes  Lifestyle  . Physical activity    Days per week: 0 days    Minutes per session: 0 min  . Stress: Not at all  Relationships  . Social Herbalist on phone: Not on file    Gets together: Not on file    Attends religious service: Not on file    Active member of club or organization: Not on file    Attends meetings of clubs or organizations: Not on file    Relationship status: Not on file  . Intimate partner violence    Fear of current or ex partner: No    Emotionally abused: No    Physically abused: No    Forced sexual activity: No  Other Topics Concern  . Not on file  Social History Narrative  . Not on file     Current Outpatient Medications on File Prior to Visit  Medication Sig Dispense Refill  . albuterol (PROVENTIL HFA;VENTOLIN HFA) 108 (90 Base) MCG/ACT inhaler Inhale 2 puffs into the lungs every 4 (four) hours as needed for wheezing or shortness of breath (cough, shortness of breath or wheezing.). 1 Inhaler 1  . Cholecalciferol (  VITAMIN D3) 50 MCG (2000 UT) TABS Take 2,000 Units by mouth daily.    . doxazosin (CARDURA) 4 MG tablet Take 1 tablet (4 mg total) by mouth at bedtime. 90 tablet 1  . EASY TOUCH INSULIN SYRINGE 30G X 5/16" 0.5 ML MISC USE 3 TIMES DAILY 30 each 4  . fexofenadine (ALLEGRA) 180 MG tablet Take 180 mg by mouth daily as needed (allergies.).     . HUMALOG MIX 50/50 (50-50) 100 UNIT/ML SUSP injection Inject 22-25 Units into the skin 2 (two) times daily. 25 units in the morning and 22 units in the afternoon    . hydrALAZINE (APRESOLINE) 25 MG tablet Take 1 tablet (25 mg total) by mouth 2 (two) times daily. 60 tablet 0  . ketorolac (ACULAR) 0.5 % ophthalmic solution Place 1 drop into both eyes 4 (four) times daily.     . losartan-hydrochlorothiazide (HYZAAR) 100-25  MG tablet TAKE 1 TABLET BY MOUTH  DAILY (Patient taking differently: Take 1 tablet by mouth daily. ) 90 tablet 3  . metFORMIN (GLUCOPHAGE-XR) 500 MG 24 hr tablet Take 1,000 mg by mouth 2 (two) times daily.     . metoprolol tartrate (LOPRESSOR) 25 MG tablet TAKE 1 TABLET BY MOUTH  TWICE DAILY (Patient taking differently: Take 25 mg by mouth 2 (two) times daily. ) 180 tablet 3  . prednisoLONE acetate (PRED FORTE) 1 % ophthalmic suspension Place 1 drop into both eyes 3 (three) times daily.     No current facility-administered medications on file prior to visit.     Cardiovascular studies:  EKG 05/17/2019: Sinus rhythm 67 bpm. Right atrial enlargement.  Poor R-wave progression.  Nonspecific T-abnormality.   Atrial tachycardia ablation 05/08/2019, Dr. Greg Taylor: Conclusion: Successful EP study and catheter ablation of an atrial tachycardia originating in the para-hisian region of the right atrium but being successfully ablated in the noncoronary cusp of the aortic root.  Echocardiogram 01/20/2019: Normal LV systolic function with EF 59%. Left ventricle cavity is normal in size. Moderate concentric hypertrophy of the left ventricle. Normal global wall motion. Doppler evidence of grade I (impaired) diastolic dysfunction, normal LAP. Calculated EF 59%. Moderate (Grade II) mitral regurgitation. Moderate tricuspid regurgitation. Estimated pulmonary artery systolic pressure 40 mmHg.   EKG 11/07/2018: Sinus rhythm 59 bpm RIght atrial enlargement Left axis deviation.  Baseline artifact.   EKG 10/17/2018: Atypical atrial flutter with RVR 160 bpm. Nonspecific ST-T abnormality  Vascular US Carotid Bilateral 11/14/2016: intimal wall thickness CCA. 1-39% ICA plaquing. Vertebral artery flow is antegrade.  MRI Brain 11/12/2016: 1. No acute intracranial abnormality identified. 2. Moderate chronic microvascular ischemic changes and moderate parenchymal volume loss of the brain. 3. Scattered  punctate foci of susceptibility hypointensity compatible with hemosiderin deposition, probably related to chronic hypertension, less likely cerebral amyloid angiopathy. 4. Normal MRA of the head.  Treadmill stress test 08/26/2015: Comments: Indication: Tachycardia The patient exercised according to [facility] Protocol, Total time recorded 6:01 min achieving max heart rate of 164 which was 95 % of MPHR for age and 7.05 METS of work. Normal BP response. Resting ECG showing tachycardia. @ 105/min. There was no ST-T changes of ischemia with exercise stress test. No arrythmias noted during exercise. Stress terminated due to shortness of breath and THR >85% met.  Review of Systems  Constitution: Negative for decreased appetite, malaise/fatigue, weight gain and weight loss.  HENT: Negative for congestion.   Eyes: Negative for visual disturbance.  Cardiovascular: Negative for chest pain, dyspnea on exertion, leg swelling, palpitations and   syncope.       Right groin swelling  Respiratory: Negative for cough.   Endocrine: Negative for cold intolerance.  Hematologic/Lymphatic: Does not bruise/bleed easily.  Skin: Negative for itching and rash.  Musculoskeletal: Negative for myalgias.  Gastrointestinal: Negative for abdominal pain, nausea and vomiting.  Genitourinary: Negative for dysuria.  Neurological: Negative for dizziness and weakness.  Psychiatric/Behavioral: The patient is not nervous/anxious.   All other systems reviewed and are negative.         Vitals:   05/17/19 1023  BP: 140/64  Pulse: 68  SpO2: 95%    Objective:  Physical Exam  Constitutional: He is oriented to person, place, and time. He appears well-developed and well-nourished. No distress.  HENT:  Head: Normocephalic and atraumatic.  Eyes: Pupils are equal, round, and reactive to light. Conjunctivae are normal.  Neck: No JVD present.  Cardiovascular: Normal rate, regular rhythm and intact distal pulses.  No murmur heard.  Pulses:      Femoral pulses are 2+ on the right side with bruit. 2 cm diameter firm swelling in right groin near right femoral artery.  Pulmonary/Chest: Effort normal and breath sounds normal. He has no wheezes. He has no rales.  Abdominal: Soft. Bowel sounds are normal. There is no rebound.  Musculoskeletal:        General: Edema (1+ RLE) present.  Lymphadenopathy:    He has no cervical adenopathy.  Neurological: He is alert and oriented to person, place, and time. No cranial nerve deficit.  Skin: Skin is warm and dry.  Psychiatric: He has a normal mood and affect.  Nursing note and vitals reviewed.          Assessment & Recommendations:   85 year old African-American male with hypertension, hyperlipidemia, type 2 diabetes mellitus, h/o CVA, CKD 3, atrial tachycardia status post ablation.  Paroxysmal atrial tachycardia: Status post ablation by Dr. Lovena Le on 05/08/2019. No recurrence.  Right groin swelling: Firm swelling, about 2 cm in diameter in right groin at the site of sheath insertion. No tenderness. Femoral pulse is well felt. Bruit is heard. I will obtain duplex US to evaluate for pseudoaneurysm or AV fistula. He knows to look for any signs of increase in the swelling, pain, redness, in which case he knows to call us immediately.   Essential hypertension Better controlled. Increase metoprolol tartarate to 25 mg tid, reduce hydralazine to 25 mg daily. Eventually, should be able to get off hydralazine.   Leg edema Mild, stable.  I will see him back in 3 weeks to re evaluate right groin swelling and discuss duples [place] results.   Nigel Mormon, MD [facility] Pager: [phone] Office: [phone] If no answer Cell [phone]

## 2019-05-18 ENCOUNTER — Other Ambulatory Visit: Payer: Self-pay

## 2019-05-18 ENCOUNTER — Telehealth: Payer: Self-pay

## 2019-05-18 MED ORDER — HYDRALAZINE HCL 25 MG PO TABS: 25.0000 mg | ORAL_TABLET | Freq: Two times a day (BID) | ORAL | 0 refills | Status: DC

## 2019-05-18 NOTE — Telephone Encounter (Signed)
Pt called to confirm what medication was increased; Per MP note it was metoprolol and pt is aware

## 2019-05-23 ENCOUNTER — Encounter: Payer: Self-pay | Admitting: Podiatry

## 2019-05-23 ENCOUNTER — Ambulatory Visit: Payer: Medicare Other | Admitting: Podiatry

## 2019-05-23 ENCOUNTER — Other Ambulatory Visit: Payer: Self-pay

## 2019-05-23 DIAGNOSIS — M79676 Pain in unspecified toe(s): Secondary | ICD-10-CM | POA: Diagnosis not present

## 2019-05-23 DIAGNOSIS — B351 Tinea unguium: Secondary | ICD-10-CM

## 2019-05-23 NOTE — Patient Instructions (Signed)
Diabetes Mellitus and Foot Care Foot care is an important part of your health, especially when you have diabetes. Diabetes may cause you to have problems because of poor blood flow (circulation) to your feet and legs, which can cause your skin to:  Become thinner and drier.  Break more easily.  Heal more slowly.  Peel and crack. You may also have nerve damage (neuropathy) in your legs and feet, causing decreased feeling in them. This means that you may not notice minor injuries to your feet that could lead to more serious problems. Noticing and addressing any potential problems early is the best way to prevent future foot problems. How to care for your feet Foot hygiene  Wash your feet daily with warm water and mild soap. Do not use hot water. Then, pat your feet and the areas between your toes until they are completely dry. Do not soak your feet as this can dry your skin.  Trim your toenails straight across. Do not dig under them or around the cuticle. File the edges of your nails with an emery board or nail file.  Apply a moisturizing lotion or petroleum jelly to the skin on your feet and to dry, brittle toenails. Use lotion that does not contain alcohol and is unscented. Do not apply lotion between your toes. Shoes and socks  Wear clean socks or stockings every day. Make sure they are not too tight. Do not wear knee-high stockings since they may decrease blood flow to your legs.  Wear shoes that fit properly and have enough cushioning. Always look in your shoes before you put them on to be sure there are no objects inside.  To break in new shoes, wear them for just a few hours a day. This prevents injuries on your feet. Wounds, scrapes, corns, and calluses  Check your feet daily for blisters, cuts, bruises, sores, and redness. If you cannot see the bottom of your feet, use a mirror or ask someone for help.  Do not cut corns or calluses or try to remove them with medicine.  If you  find a minor scrape, cut, or break in the skin on your feet, keep it and the skin around it clean and dry. You may clean these areas with mild soap and water. Do not clean the area with peroxide, alcohol, or iodine.  If you have a wound, scrape, corn, or callus on your foot, look at it several times a day to make sure it is healing and not infected. Check for: ? Redness, swelling, or pain. ? Fluid or blood. ? Warmth. ? Pus or a bad smell. General instructions  Do not cross your legs. This may decrease blood flow to your feet.  Do not use heating pads or hot water bottles on your feet. They may burn your skin. If you have lost feeling in your feet or legs, you may not know this is happening until it is too late.  Protect your feet from hot and cold by wearing shoes, such as at the beach or on hot pavement.  Schedule a complete foot exam at least once a year (annually) or more often if you have foot problems. If you have foot problems, report any cuts, sores, or bruises to your health care provider immediately. Contact a health care provider if:  You have a medical condition that increases your risk of infection and you have any cuts, sores, or bruises on your feet.  You have an injury that is not   healing.  You have redness on your legs or feet.  You feel burning or tingling in your legs or feet.  You have pain or cramps in your legs and feet.  Your legs or feet are numb.  Your feet always feel cold.  You have pain around a toenail. Get help right away if:  You have a wound, scrape, corn, or callus on your foot and: ? You have pain, swelling, or redness that gets worse. ? You have fluid or blood coming from the wound, scrape, corn, or callus. ? Your wound, scrape, corn, or callus feels warm to the touch. ? You have pus or a bad smell coming from the wound, scrape, corn, or callus. ? You have a fever. ? You have a red line going up your leg. Summary  Check your feet every day  for cuts, sores, red spots, swelling, and blisters.  Moisturize feet and legs daily.  Wear shoes that fit properly and have enough cushioning.  If you have foot problems, report any cuts, sores, or bruises to your health care provider immediately.  Schedule a complete foot exam at least once a year (annually) or more often if you have foot problems. This information is not intended to replace advice given to you by your health care provider. Make sure you discuss any questions you have with your health care provider. Document Released: 07/31/2000 Document Revised: 09/15/2017 Document Reviewed: 09/04/2016 Elsevier Patient Education  2020 [employer]   Onychomycosis/Fungal Toenails  WHAT IS IT? An infection that lies within the keratin of your nail plate that is caused by a fungus.  WHY ME? Fungal infections affect all ages, sexes, races, and creeds.  There may be many factors that predispose you to a fungal infection such as age, coexisting medical conditions such as diabetes, or an autoimmune disease; stress, medications, fatigue, genetics, etc.  Bottom line: fungus thrives in a warm, moist environment and your shoes offer such a location.  IS IT CONTAGIOUS? Theoretically, yes.  You do not want to share shoes, nail clippers or files with someone who has fungal toenails.  Walking around barefoot in the same room or sleeping in the same bed is unlikely to transfer the organism.  It is important to realize, however, that fungus can spread easily from one nail to the next on the same foot.  HOW DO WE TREAT THIS?  There are several ways to treat this condition.  Treatment may depend on many factors such as age, medications, pregnancy, liver and kidney conditions, etc.  It is best to ask your doctor which options are available to you.  1. No treatment.   Unlike many other medical concerns, you can live with this condition.  However for many people this can be a painful condition and may lead to  ingrown toenails or a bacterial infection.  It is recommended that you keep the nails cut short to help reduce the amount of fungal nail. 2. Topical treatment.  These range from herbal remedies to prescription strength nail lacquers.  About 40-50% effective, topicals require twice daily application for approximately 9 to 12 months or until an entirely new nail has grown out.  The most effective topicals are medical grade medications available through physicians offices. 3. Oral antifungal medications.  With an 80-90% cure rate, the most common oral medication requires 3 to 4 months of therapy and stays in your system for a year as the new nail grows out.  Oral antifungal medications do require   blood work to make sure it is a safe drug for you.  A liver function panel will be performed prior to starting the medication and after the first month of treatment.  It is important to have the blood work performed to avoid any harmful side effects.  In general, this medication safe but blood work is required. 4. Laser Therapy.  This treatment is performed by applying a specialized laser to the affected nail plate.  This therapy is noninvasive, fast, and non-painful.  It is not covered by insurance and is therefore, out of pocket.  The results have been very good with a 80-95% cure rate.  The [facility] is the only practice in the area to offer this therapy. 5. Permanent Nail Avulsion.  Removing the entire nail so that a new nail will not grow back. 

## 2019-05-25 NOTE — Progress Notes (Signed)
Subjective: Marcus Johnston is seen today for follow up painful, elongated, thickened toenails 1-5 b/l feet that he cannot cut. Pain interferes with daily activities. Aggravating factor includes wearing enclosed shoe gear and relieved with periodic debridement.  Current Outpatient Medications on File Prior to Visit  Medication Sig  . albuterol (PROVENTIL HFA;VENTOLIN HFA) 108 (90 Base) MCG/ACT inhaler Inhale 2 puffs into the lungs every 4 (four) hours as needed for wheezing or shortness of breath (cough, shortness of breath or wheezing.).  Marland Kitchen Cholecalciferol (VITAMIN D3) 50 MCG (2000 UT) TABS Take 2,000 Units by mouth daily.  Marland Kitchen doxazosin (CARDURA) 4 MG tablet Take 1 tablet (4 mg total) by mouth at bedtime.  Marland Kitchen EASY TOUCH INSULIN SYRINGE 30G X 5/16" 0.5 ML MISC USE 3 TIMES DAILY  . fexofenadine (ALLEGRA) 180 MG tablet Take 180 mg by mouth daily as needed (allergies.).   Marland Kitchen HUMALOG MIX 50/50 (50-50) 100 UNIT/ML SUSP injection Inject 22-25 Units into the skin 2 (two) times daily. 25 units in the morning and 22 units in the afternoon  . hydrALAZINE (APRESOLINE) 25 MG tablet Take 1 tablet (25 mg total) by mouth 2 (two) times daily.  Marland Kitchen ketorolac (ACULAR) 0.5 % ophthalmic solution Place 1 drop into both eyes 4 (four) times daily.   Marland Kitchen losartan-hydrochlorothiazide (HYZAAR) 100-25 MG tablet TAKE 1 TABLET BY MOUTH  DAILY (Patient taking differently: Take 1 tablet by mouth daily. )  . metFORMIN (GLUCOPHAGE-XR) 500 MG 24 hr tablet Take 1,000 mg by mouth 2 (two) times daily.   . metoprolol tartrate (LOPRESSOR) 25 MG tablet TAKE 1 TABLET BY MOUTH  TWICE DAILY (Patient taking differently: Take 25 mg by mouth 2 (two) times daily. )  . prednisoLONE acetate (PRED FORTE) 1 % ophthalmic suspension Place 1 drop into both eyes 3 (three) times daily.   No current facility-administered medications on file prior to visit.      Allergies  Allergen Reactions  . Statins Other (See Comments)    pain     Objective:  Vascular Examination: Capillary refill time immediate x 10 digits.  Dorsalis pedis present b/l.  Posterior tibial pulses present b/l.  Digital hair absent b/l.  Skin temperature gradient WNL b/l.   Dermatological Examination: Skin with normal turgor, texture and tone b/l.  Toenails 1-5 b/l discolored, thick, dystrophic with subungual debris and pain with palpation to nailbeds due to thickness of nails.  Musculoskeletal: Muscle strength 5/5 b/l to all LE muscle groups.  No pain, crepitus or joint limitation noted with ROM.   Neurological Examination: Protective sensation intact 5/5 b/l with 10 gram monofilament.  Epicritic sensation present bilaterally.  Vibratory sensation intact bilaterally.   Assessment: Painful onychomycosis toenails 1-5 b/l   Plan: 1. Toenails 1-5 b/l were debrided in length and girth without iatrogenic bleeding. 2. Patient to continue soft, supportive shoe gear. 3. Patient to report any pedal injuries to medical professional immediately. 4. Follow up 3 months.  5. Patient/POA to call should there be a concern in the interim.

## 2019-05-26 ENCOUNTER — Ambulatory Visit (INDEPENDENT_AMBULATORY_CARE_PROVIDER_SITE_OTHER): Payer: Medicare Other

## 2019-05-26 ENCOUNTER — Other Ambulatory Visit: Payer: Self-pay

## 2019-05-26 DIAGNOSIS — R0989 Other specified symptoms and signs involving the circulatory and respiratory systems: Secondary | ICD-10-CM

## 2019-05-28 NOTE — Progress Notes (Signed)
Subjective:   Marcus Johnston, male    DOB: [birth year], 85 y.o.   MRN: [id number]   Chief Complaint  Patient presents with  . Atrial tachycardia  . Hypertension   HPI  85 year old African-American male with hypertension, hyperlipidemia, type 2 diabetes mellitus, h/o CVA, CKD 3, atrial tachycardia status post ablation.  He had complained of right groin swelling since the procedure. [place] did not show pseudoaneurysm or any other abnormalities at the groin.  He has no new complaints. Blood pressure remains elevated.  Past Medical History:  Diagnosis Date  . Atrial fibrillation ([place])   . Atypical atrial flutter ([place]) 10/17/2018  . Diabetes mellitus without complication ([place])   . High cholesterol   . History of prostate cancer   . HTN (hypertension)   . Renal insufficiency      Past Surgical History:  Procedure Laterality Date  . ATRIAL TACH ABLATION N/A 05/08/2019   Procedure: ATRIAL TACH ABLATION;  Surgeon: [name], MD;  Location: [facility];  Service: Cardiovascular;  Laterality: N/A;  . prostate implant       Social History   Socioeconomic History  . Marital status: Married    Spouse name: Not on file  . Number of children: 5  . Years of education: Not on file  . Highest education level: Not on file  Occupational History  . Occupation: retired  Scientific laboratory technician  . Financial resource strain: Not hard at all  . Food insecurity    Worry: Never true    Inability: Never true  . Transportation needs    Medical: No    Non-medical: No  Tobacco Use  . Smoking status: Never Smoker  . Smokeless tobacco: Never Used  Substance and Sexual Activity  . Alcohol use: No  . Drug use: No  . Sexual activity: Yes  Lifestyle  . Physical activity    Days per week: 0 days    Minutes per session: 0 min  . Stress: Not at all  Relationships  . Social Herbalist on phone: Not on file    Gets together: Not on file    Attends religious service: Not on  file    Active member of club or organization: Not on file    Attends meetings of clubs or organizations: Not on file    Relationship status: Not on file  . Intimate partner violence    Fear of current or ex partner: No    Emotionally abused: No    Physically abused: No    Forced sexual activity: No  Other Topics Concern  . Not on file  Social History Narrative  . Not on file     Current Outpatient Medications on File Prior to Visit  Medication Sig Dispense Refill  . albuterol (PROVENTIL HFA;VENTOLIN HFA) 108 (90 Base) MCG/ACT inhaler Inhale 2 puffs into the lungs every 4 (four) hours as needed for wheezing or shortness of breath (cough, shortness of breath or wheezing.). 1 Inhaler 1  . Cholecalciferol (VITAMIN D3) 50 MCG (2000 UT) TABS Take 2,000 Units by mouth daily.    Marland Kitchen doxazosin (CARDURA) 4 MG tablet Take 1 tablet (4 mg total) by mouth at bedtime. 90 tablet 1  . EASY TOUCH INSULIN SYRINGE 30G X 5/16" 0.5 ML MISC USE 3 TIMES DAILY 30 each 4  . fexofenadine (ALLEGRA) 180 MG tablet Take 180 mg by mouth daily as needed (allergies.).     Marland Kitchen HUMALOG MIX 50/50 (  50-50) 100 UNIT/ML SUSP injection Inject 22-25 Units into the skin 2 (two) times daily. 25 units in the morning and 22 units in the afternoon    . hydrALAZINE (APRESOLINE) 25 MG tablet Take 1 tablet (25 mg total) by mouth 2 (two) times daily. 60 tablet 0  . ketorolac (ACULAR) 0.5 % ophthalmic solution Place 1 drop into both eyes 4 (four) times daily.     Marland Kitchen losartan-hydrochlorothiazide (HYZAAR) 100-25 MG tablet TAKE 1 TABLET BY MOUTH  DAILY (Patient taking differently: Take 1 tablet by mouth daily. ) 90 tablet 3  . metFORMIN (GLUCOPHAGE-XR) 500 MG 24 hr tablet Take 1,000 mg by mouth 2 (two) times daily.     . metoprolol tartrate (LOPRESSOR) 25 MG tablet TAKE 1 TABLET BY MOUTH  TWICE DAILY (Patient taking differently: Take 25 mg by mouth 2 (two) times daily. ) 180 tablet 3  . prednisoLONE acetate (PRED FORTE) 1 % ophthalmic suspension  Place 1 drop into both eyes 3 (three) times daily.     No current facility-administered medications on file prior to visit.     Cardiovascular studies:  Lower Extremity Arterial Duplex 05/26/2019: Moderate velocity increase at the right mid anterior tibial artery suggests >50% stenosis.  No hemodynamically significant stenosis is identified in the left lower extremity arterial system. Non-compressible ABI on the right, suggestive of medial calcinosis. Non-compressible ABI on the left, suggestive of medial calcinosis.  No pseudoaneurysm noted in bilateral goins/femoral arteries.  EKG 05/17/2019: Sinus rhythm 67 bpm. Right atrial enlargement.  Poor R-wave progression.  Nonspecific T-abnormality.   Atrial tachycardia ablation 05/08/2019, Dr. Crissie Sickles: Conclusion: Successful EP study and catheter ablation of an atrial tachycardia originating in the para-hisian region of the right atrium but being successfully ablated in the noncoronary cusp of the aortic root.  Echocardiogram 01/20/2019: Normal LV systolic function with EF 59%. Left ventricle cavity is normal in size. Moderate concentric hypertrophy of the left ventricle. Normal global wall motion. Doppler evidence of grade I (impaired) diastolic dysfunction, normal LAP. Calculated EF 59%. Moderate (Grade II) mitral regurgitation. Moderate tricuspid regurgitation. Estimated pulmonary artery systolic pressure 40 mmHg.   EKG 11/07/2018: Sinus rhythm 59 bpm RIght atrial enlargement Left axis deviation.  Baseline artifact.   EKG 10/17/2018: Atypical atrial flutter with RVR 160 bpm. Nonspecific ST-T abnormality  Vascular US Carotid Bilateral 11/14/2016: intimal wall thickness CCA. 1-39% ICA plaquing. Vertebral artery flow is antegrade.  MRI Brain 11/12/2016: 1. No acute intracranial abnormality identified. 2. Moderate chronic microvascular ischemic changes and moderate parenchymal volume loss of the brain. 3. Scattered punctate  foci of susceptibility hypointensity compatible with hemosiderin deposition, probably related to chronic hypertension, less likely cerebral amyloid angiopathy. 4. Normal MRA of the head.  Treadmill stress test 08/26/2015: Comments: Indication: Tachycardia The patient exercised according to Bruce Protocol, Total time recorded 6:01 min achieving max heart rate of 164 which was 95 % of MPHR for age and 7.05 METS of work. Normal BP response. Resting ECG showing tachycardia. @ 105/min. There was no ST-T changes of ischemia with exercise stress test. No arrythmias noted during exercise. Stress terminated due to shortness of breath and THR >85% met.  Labs: Results for REINHARDT, LICAUSI (MRN [id number]) as of 06/05/2019 10:39  Ref. Range 05/02/2019 02:51  Sodium Latest Ref Range: 135 - 145 mmol/L 139  Potassium Latest Ref Range: 3.5 - 5.1 mmol/L 3.9  Chloride Latest Ref Range: 98 - 111 mmol/L 105  CO2 Latest Ref Range: 22 - 32 mmol/L 20 (L)  Glucose Latest Ref Range: 70 - 99 mg/dL 102 (H)  BUN Latest Ref Range: 8 - 23 mg/dL 18  Creatinine Latest Ref Range: 0.61 - 1.24 mg/dL 1.21  Calcium Latest Ref Range: 8.9 - 10.3 mg/dL 9.0  Anion gap Latest Ref Range: 5 - 15  14  GFR, Est Non African American Latest Ref Range: >60 mL/min 54 (L)  GFR, Est African American Latest Ref Range: >60 mL/min >60   Results for JERMEL, ARTLEY (MRN [id number]) as of 06/05/2019 10:39  Ref. Range 05/02/2019 06:30  WBC Latest Ref Range: 4.0 - 10.5 K/uL 4.8  RBC Latest Ref Range: 4.22 - 5.81 MIL/uL 4.40  Hemoglobin Latest Ref Range: 13.0 - 17.0 g/dL 12.7 (L)  HCT Latest Ref Range: 39.0 - 52.0 % 36.6 (L)  MCV Latest Ref Range: 80.0 - 100.0 fL 83.2  MCH Latest Ref Range: 26.0 - 34.0 pg 28.9  MCHC Latest Ref Range: 30.0 - 36.0 g/dL 34.7  RDW Latest Ref Range: 11.5 - 15.5 % 15.4  Platelets Latest Ref Range: 150 - 400 K/uL 174  nRBC Latest Ref Range: 0.0 - 0.2 % 0.0    Review of Systems  Constitution: Negative for decreased  appetite, malaise/fatigue, weight gain and weight loss.  HENT: Negative for congestion.   Eyes: Negative for visual disturbance.  Cardiovascular: Negative for chest pain, dyspnea on exertion, leg swelling, palpitations and syncope.       Right groin swelling  Respiratory: Negative for cough.   Endocrine: Negative for cold intolerance.  Hematologic/Lymphatic: Does not bruise/bleed easily.  Skin: Negative for itching and rash.  Musculoskeletal: Negative for myalgias.  Gastrointestinal: Negative for abdominal pain, nausea and vomiting.  Genitourinary: Negative for dysuria.  Neurological: Negative for dizziness and weakness.  Psychiatric/Behavioral: The patient is not nervous/anxious.   All other systems reviewed and are negative.         Vitals:   06/05/19 1019 06/05/19 1037  BP: (!) 180/76 (!) 166/71  Pulse: 69 74  SpO2: 96%     Objective:  Physical Exam  Constitutional: He is oriented to person, place, and time. He appears well-developed and well-nourished. No distress.  HENT:  Head: Normocephalic and atraumatic.  Eyes: Pupils are equal, round, and reactive to light. Conjunctivae are normal.  Neck: No JVD present.  Cardiovascular: Normal rate, regular rhythm and intact distal pulses.  No murmur heard. Pulses:      Femoral pulses are 2+ on the right side. Right groin swelling has resolved  Pulmonary/Chest: Effort normal and breath sounds normal. He has no wheezes. He has no rales.  Abdominal: Soft. Bowel sounds are normal. There is no rebound.  Musculoskeletal:        General: No edema.  Lymphadenopathy:    He has no cervical adenopathy.  Neurological: He is alert and oriented to person, place, and time. No cranial nerve deficit.  Skin: Skin is warm and dry.  Psychiatric: He has a normal mood and affect.  Nursing note and vitals reviewed.          Assessment & Recommendations:   85 year old African-American male with hypertension, hyperlipidemia, type 2  diabetes mellitus, h/o CVA, CKD 3, atrial tachycardia status post ablation.  Paroxysmal atrial tachycardia: Status post ablation by Dr. Lovena Le on 05/08/2019. No recurrence.  Right groin swelling: No pseudoaneurysm on [place]. Swelling has resolved.  Essential hypertension Remains uncontrolled. He does not have f/u w/PCP anytime soon. I have taken the liberty to switch metoprolol tartarate 25 mg bid to carvedilol  6.25 mg bid. BP check visit in 2 weeks. F/u w/me in 6 months.   Nigel Mormon, MD [facility] Pager: [phone] Office: [phone] If no answer Cell [phone]

## 2019-06-05 ENCOUNTER — Ambulatory Visit: Payer: Medicare Other | Admitting: Cardiology

## 2019-06-05 ENCOUNTER — Encounter: Payer: Self-pay | Admitting: Cardiology

## 2019-06-05 ENCOUNTER — Other Ambulatory Visit: Payer: Self-pay

## 2019-06-05 VITALS — BP 166/71 | HR 74 | Ht 75.0 in | Wt 241.0 lb

## 2019-06-05 DIAGNOSIS — R1909 Other intra-abdominal and pelvic swelling, mass and lump: Secondary | ICD-10-CM | POA: Diagnosis not present

## 2019-06-05 DIAGNOSIS — I471 Supraventricular tachycardia: Secondary | ICD-10-CM

## 2019-06-05 DIAGNOSIS — I10 Essential (primary) hypertension: Secondary | ICD-10-CM

## 2019-06-05 MED ORDER — CARVEDILOL 6.25 MG PO TABS: 6.2500 mg | ORAL_TABLET | Freq: Two times a day (BID) | ORAL | 3 refills | Status: DC

## 2019-06-06 ENCOUNTER — Encounter: Payer: Self-pay | Admitting: Internal Medicine

## 2019-06-12 ENCOUNTER — Other Ambulatory Visit: Payer: Self-pay | Admitting: Internal Medicine

## 2019-06-15 ENCOUNTER — Other Ambulatory Visit: Payer: Self-pay | Admitting: Internal Medicine

## 2019-06-15 ENCOUNTER — Other Ambulatory Visit: Payer: Self-pay

## 2019-06-15 ENCOUNTER — Encounter: Payer: Self-pay | Admitting: Internal Medicine

## 2019-06-15 ENCOUNTER — Ambulatory Visit: Payer: Medicare Other | Admitting: Internal Medicine

## 2019-06-15 VITALS — BP 168/84 | HR 71 | Ht 75.0 in | Wt 243.4 lb

## 2019-06-15 DIAGNOSIS — I471 Supraventricular tachycardia: Secondary | ICD-10-CM

## 2019-06-15 NOTE — Progress Notes (Signed)
HPI Marcus Johnston returns today for follow of atrial tachycardia, s/p ablation. He is a pleasant 85 yo man with atrial tachycardia which was fairly incessant. He underwent EP study and catheter ablation of atrial tachycardia which was found to originate from the AV node area. Successful ablation was carried out in the non-coronary cusp after it became clear that ablation on the right side of the heart would result in a PPM. He has not had palpitations and denies chest pain. He has had his bp meds adjusted and will continue to do so. He has not had palpitations.  Allergies  Allergen Reactions  . Statins Other (See Comments)    pain     Current Outpatient Medications  Medication Sig Dispense Refill  . albuterol (PROVENTIL HFA;VENTOLIN HFA) 108 (90 Base) MCG/ACT inhaler Inhale 2 puffs into the lungs every 4 (four) hours as needed for wheezing or shortness of breath (cough, shortness of breath or wheezing.). 1 Inhaler 1  . carvedilol (COREG) 6.25 MG tablet Take 1 tablet (6.25 mg total) by mouth 2 (two) times daily. 60 tablet 3  . Cholecalciferol (VITAMIN D3) 50 MCG (2000 UT) TABS Take 2,000 Units by mouth daily.    Marland Kitchen doxazosin (CARDURA) 4 MG tablet Take 1 tablet (4 mg total) by mouth at bedtime. 90 tablet 1  . EASY TOUCH INSULIN SYRINGE 30G X 5/16" 0.5 ML MISC USE 3 TIMES DAILY 30 each 4  . fexofenadine (ALLEGRA) 180 MG tablet Take 180 mg by mouth daily as needed (allergies.).     Marland Kitchen HUMALOG MIX 50/50 (50-50) 100 UNIT/ML SUSP injection Inject 22-25 Units into the skin 2 (two) times daily. 25 units in the morning and 22 units in the afternoon    . hydrALAZINE (APRESOLINE) 25 MG tablet TAKE 1 TABLET BY MOUTH TWICE A DAY 60 tablet 0  . ketorolac (ACULAR) 0.5 % ophthalmic solution Place 1 drop into both eyes 4 (four) times daily.     Marland Kitchen losartan-hydrochlorothiazide (HYZAAR) 100-25 MG tablet TAKE 1 TABLET BY MOUTH  DAILY (Patient taking differently: Take 1 tablet by mouth daily. ) 90 tablet 3  .  metFORMIN (GLUCOPHAGE-XR) 500 MG 24 hr tablet Take 1,000 mg by mouth 2 (two) times daily.     . prednisoLONE acetate (PRED FORTE) 1 % ophthalmic suspension Place 1 drop into both eyes 3 (three) times daily.     No current facility-administered medications for this visit.      Past Medical History:  Diagnosis Date  . Atrial fibrillation ([place])   . Atypical atrial flutter ([place]) 10/17/2018  . Diabetes mellitus without complication ([place])   . High cholesterol   . History of prostate cancer   . HTN (hypertension)   . Renal insufficiency     ROS:   All systems reviewed and negative except as noted in the HPI.   Past Surgical History:  Procedure Laterality Date  . ATRIAL TACH ABLATION N/A 05/08/2019   Procedure: ATRIAL TACH ABLATION;  Surgeon: [name], MD;  Location: [facility];  Service: Cardiovascular;  Laterality: N/A;  . prostate implant       Family History  Problem Relation Age of Onset  . Cancer Mother   . Cancer Father      Social History   Socioeconomic History  . Marital status: Married    Spouse name: Not on file  . Number of children: 5  . Years of education: Not on file  . Highest education level:  Not on file  Occupational History  . Occupation: retired  Scientific laboratory technician  . Financial resource strain: Not hard at all  . Food insecurity    Worry: Never true    Inability: Never true  . Transportation needs    Medical: No    Non-medical: No  Tobacco Use  . Smoking status: Never Smoker  . Smokeless tobacco: Never Used  Substance and Sexual Activity  . Alcohol use: No  . Drug use: No  . Sexual activity: Yes  Lifestyle  . Physical activity    Days per week: 0 days    Minutes per session: 0 min  . Stress: Not at all  Relationships  . Social Herbalist on phone: Not on file    Gets together: Not on file    Attends religious service: Not on file    Active member of club or organization: Not on file    Attends meetings of clubs or  organizations: Not on file    Relationship status: Not on file  . Intimate partner violence    Fear of current or ex partner: No    Emotionally abused: No    Physically abused: No    Forced sexual activity: No  Other Topics Concern  . Not on file  Social History Narrative  . Not on file     There were no vitals taken for this visit.  Physical Exam:  Well appearing 85 yo man who looks younger than his stated aged, NAD HEENT: Unremarkable Neck:  No JVD, no thyromegally Lymphatics:  No adenopathy Back:  No CVA tenderness Lungs:  Clear with no wheezes HEART:  Regular rate rhythm, no murmurs, no rubs, no clicks Abd:  soft, positive bowel sounds, no organomegally, no rebound, no guarding Ext:  2 plus pulses, no edema, no cyanosis, no clubbing Skin:  No rashes no nodules Neuro:  CN II through XII intact, motor grossly intact  EKG - nsr   Assess/Plan: 1. Atrial tachycardia - he is s/p ablation and doing well with no recurrent atrial tachycardia. I reviewed the etiology of his arrhythmia and described his ablation to him and I do not expect more tachycardia. I do not think that the patient will require anti-coagulation for this rhythm.  2. HTN - he is in the process of having his meds adjusted but Dr. Virgina Jock.

## 2019-06-15 NOTE — Patient Instructions (Addendum)
Medication Instructions:  Your physician recommends that you continue on your current medications as directed. Please refer to the Current Medication list given to you today.  Labwork: None ordered.  Testing/Procedures: None ordered.  Follow-Up: Your physician wants you to follow-up in: as needed with Dr. Taylor.      Any Other Special Instructions Will Be Listed Below (If Applicable).  If you need a refill on your cardiac medications before your next appointment, please call your pharmacy.   

## 2019-06-19 ENCOUNTER — Encounter: Payer: Self-pay | Admitting: Cardiology

## 2019-06-19 ENCOUNTER — Other Ambulatory Visit: Payer: Self-pay

## 2019-06-19 ENCOUNTER — Ambulatory Visit (INDEPENDENT_AMBULATORY_CARE_PROVIDER_SITE_OTHER): Payer: Medicare Other | Admitting: Cardiology

## 2019-06-19 DIAGNOSIS — I10 Essential (primary) hypertension: Secondary | ICD-10-CM

## 2019-06-19 MED ORDER — CARVEDILOL 12.5 MG PO TABS: 12.5000 mg | ORAL_TABLET | Freq: Two times a day (BID) | ORAL | 2 refills | Status: DC

## 2019-06-19 NOTE — Progress Notes (Signed)
Blood pressure remains uncontrolled. Increases Coreg to 12.5 mg bid. Recommend close f/u w/PCP.  Nigel Mormon, MD [facility]

## 2019-06-22 ENCOUNTER — Other Ambulatory Visit: Payer: Self-pay

## 2019-06-22 ENCOUNTER — Ambulatory Visit (INDEPENDENT_AMBULATORY_CARE_PROVIDER_SITE_OTHER): Payer: Medicare Other | Admitting: Internal Medicine

## 2019-06-22 ENCOUNTER — Encounter: Payer: Self-pay | Admitting: Internal Medicine

## 2019-06-22 VITALS — BP 146/72 | HR 84 | Temp 97.9°F | Ht 75.0 in | Wt 238.6 lb

## 2019-06-22 DIAGNOSIS — I129 Hypertensive chronic kidney disease with stage 1 through stage 4 chronic kidney disease, or unspecified chronic kidney disease: Secondary | ICD-10-CM

## 2019-06-22 DIAGNOSIS — N182 Chronic kidney disease, stage 2 (mild): Secondary | ICD-10-CM

## 2019-06-22 NOTE — Patient Instructions (Signed)
Exercising to Stay Healthy To become healthy and stay healthy, it is recommended that you do moderate-intensity and vigorous-intensity exercise. You can tell that you are exercising at a moderate intensity if your heart starts beating faster and you start breathing faster but can still hold a conversation. You can tell that you are exercising at a vigorous intensity if you are breathing much harder and faster and cannot hold a conversation while exercising. Exercising regularly is important. It has many health benefits, such as:  Improving overall fitness, flexibility, and endurance.  Increasing bone density.  Helping with weight control.  Decreasing body fat.  Increasing muscle strength.  Reducing stress and tension.  Improving overall health. How often should I exercise? Choose an activity that you enjoy, and set realistic goals. Your health care provider can help you make an activity plan that works for you. Exercise regularly as told by your health care provider. This may include:  Doing strength training two times a week, such as: ? Lifting weights. ? Using resistance bands. ? Push-ups. ? Sit-ups. ? Yoga.  Doing a certain intensity of exercise for a given amount of time. Choose from these options: ? A total of 150 minutes of moderate-intensity exercise every week. ? A total of 75 minutes of vigorous-intensity exercise every week. ? A mix of moderate-intensity and vigorous-intensity exercise every week. Children, pregnant women, people who have not exercised regularly, people who are overweight, and older adults may need to talk with a health care provider about what activities are safe to do. If you have a medical condition, be sure to talk with your health care provider before you start a new exercise program. What are some exercise ideas? Moderate-intensity exercise ideas include:  Walking 1 mile (1.6 km) in about 15 minutes.  Biking.  Hiking.  Golfing.  Dancing.   Water aerobics. Vigorous-intensity exercise ideas include:  Walking 4.5 miles (7.2 km) or more in about 1 hour.  Jogging or running 5 miles (8 km) in about 1 hour.  Biking 10 miles (16.1 km) or more in about 1 hour.  Lap swimming.  Roller-skating or in-line skating.  Cross-country skiing.  Vigorous competitive sports, such as football, basketball, and soccer.  Jumping rope.  Aerobic dancing. What are some everyday activities that can help me to get exercise?  Yard work, such as: ? Pushing a lawn mower. ? Raking and bagging leaves.  Washing your car.  Pushing a stroller.  Shoveling snow.  Gardening.  Washing windows or floors. How can I be more active in my day-to-day activities?  Use stairs instead of an elevator.  Take a walk during your lunch break.  If you drive, park your car farther away from your work or school.  If you take public transportation, get off one stop early and walk the rest of the way.  Stand up or walk around during all of your indoor phone calls.  Get up, stretch, and walk around every 30 minutes throughout the day.  Enjoy exercise with a friend. Support to continue exercising will help you keep a regular routine of activity. What guidelines can I follow while exercising?  Before you start a new exercise program, talk with your health care provider.  Do not exercise so much that you hurt yourself, feel dizzy, or get very short of breath.  Wear comfortable clothes and wear shoes with good support.  Drink plenty of water while you exercise to prevent dehydration or heat stroke.  Work out until your breathing   and your heartbeat get faster. Where to find more information  [place] Department of Health and Human Services: www.hhs.gov  Centers for Disease Control and Prevention (CDC): www.cdc.gov Summary  Exercising regularly is important. It will improve your overall fitness, flexibility, and endurance.  Regular exercise also will  improve your overall health. It can help you control your weight, reduce stress, and improve your bone density.  Do not exercise so much that you hurt yourself, feel dizzy, or get very short of breath.  Before you start a new exercise program, talk with your health care provider. This information is not intended to replace advice given to you by your health care provider. Make sure you discuss any questions you have with your health care provider. Document Released: 09/05/2010 Document Revised: 07/16/2017 Document Reviewed: 06/24/2017 Elsevier Patient Education  2020 [employer]  

## 2019-06-25 NOTE — Progress Notes (Signed)
Subjective:     Patient ID: Marcus Johnston , male    DOB: 14-Apr-1931 , 85 y.o.   MRN: [id number]   Chief Complaint  Patient presents with  . Hypertension    HPI  He is here today for BP check. He was recently evaluated by Cardiology, he reports his BP was markedly elevated.  He reports meds changed, he also reports medication compliance.   Hypertension This is a chronic problem. The current episode started more than 1 year ago. The problem has been gradually improving since onset. Pertinent negatives include no blurred vision, chest pain, palpitations or shortness of breath. The current treatment provides moderate improvement. Compliance problems include exercise.      Past Medical History:  Diagnosis Date  . Atrial fibrillation ([place])   . Atypical atrial flutter ([place]) 10/17/2018  . Diabetes mellitus without complication ([place])   . High cholesterol   . History of prostate cancer   . HTN (hypertension)   . Renal insufficiency      Family History  Problem Relation Age of Onset  . Cancer Mother   . Cancer Father      Current Outpatient Medications:  .  albuterol (PROVENTIL HFA;VENTOLIN HFA) 108 (90 Base) MCG/ACT inhaler, Inhale 2 puffs into the lungs every 4 (four) hours as needed for wheezing or shortness of breath (cough, shortness of breath or wheezing.)., Disp: 1 Inhaler, Rfl: 1 .  Cholecalciferol (VITAMIN D3) 50 MCG (2000 UT) TABS, Take 2,000 Units by mouth daily., Disp: , Rfl:  .  doxazosin (CARDURA) 4 MG tablet, TAKE 1 TABLET BY MOUTH AT  BEDTIME, Disp: 90 tablet, Rfl: 3 .  EASY TOUCH INSULIN SYRINGE 30G X 5/16" 0.5 ML MISC, USE 3 TIMES DAILY, Disp: 30 each, Rfl: 4 .  fexofenadine (ALLEGRA) 180 MG tablet, Take 180 mg by mouth daily as needed (allergies.). , Disp: , Rfl:  .  HUMALOG MIX 50/50 (50-50) 100 UNIT/ML SUSP injection, Inject 22-25 Units into the skin 2 (two) times daily. 25 units in the morning and 22 units in the afternoon, Disp: , Rfl:  .  hydrALAZINE  (APRESOLINE) 25 MG tablet, TAKE 1 TABLET BY MOUTH TWICE A DAY, Disp: 60 tablet, Rfl: 0 .  ketorolac (ACULAR) 0.5 % ophthalmic solution, Place 1 drop into both eyes 4 (four) times daily. , Disp: , Rfl:  .  losartan-hydrochlorothiazide (HYZAAR) 100-25 MG tablet, TAKE 1 TABLET BY MOUTH  DAILY (Patient taking differently: Take 1 tablet by mouth daily. ), Disp: 90 tablet, Rfl: 3 .  metFORMIN (GLUCOPHAGE-XR) 500 MG 24 hr tablet, Take 1,000 mg by mouth 2 (two) times daily. , Disp: , Rfl:  .  prednisoLONE acetate (PRED FORTE) 1 % ophthalmic suspension, Place 1 drop into both eyes 3 (three) times daily., Disp: , Rfl:  .  carvedilol (COREG) 12.5 MG tablet, Take 1 tablet (12.5 mg total) by mouth 2 (two) times daily with a meal. (Patient not taking: Reported on 06/22/2019), Disp: 60 tablet, Rfl: 2   Allergies  Allergen Reactions  . Statins Other (See Comments)    pain     Review of Systems  Constitutional: Negative.   Eyes: Negative for blurred vision.  Respiratory: Negative.  Negative for shortness of breath.   Cardiovascular: Negative.  Negative for chest pain and palpitations.  Gastrointestinal: Negative.   Neurological: Negative.   Psychiatric/Behavioral: Negative.      Today's Vitals   06/22/19 0953  BP: (!) 146/72  Pulse: 84  Temp: 97.9 F (36.6 C)  TempSrc: Oral  Weight: 238 lb 9.6 oz (108.2 kg)  Height: 6\' 3"  (1.905 m)  PainSc: 0-No pain   Body mass index is 29.82 kg/m.   Objective:  Physical Exam Vitals signs and nursing note reviewed.  Constitutional:      Appearance: Normal appearance.  Cardiovascular:     Rate and Rhythm: Normal rate and regular rhythm.     Heart sounds: Normal heart sounds.  Pulmonary:     Effort: Pulmonary effort is normal.     Breath sounds: Normal breath sounds.  Skin:    General: Skin is warm.  Neurological:     General: No focal deficit present.     Mental Status: He is alert.  Psychiatric:        Mood and Affect: Mood normal.          Assessment And Plan:     1. Hypertensive nephropathy  Chronic, not yet at goal. However, this is much improved. I reviewed recent Cardiology note in full during his visit. He has yet to start higher dose of carvedilol 12.5mg  twice daily as prescribed by Dr. Fraser Din. He is encouraged to start this immediately.  He will rto in 4-6 weeks for re-evaluation.   2. Chronic renal disease, stage II  Chronic. NO need for bloodwork today.   [name], MD    THE PATIENT IS ENCOURAGED TO PRACTICE SOCIAL DISTANCING DUE TO THE COVID-19 PANDEMIC.

## 2019-06-26 ENCOUNTER — Other Ambulatory Visit: Payer: Self-pay

## 2019-06-26 MED ORDER — HYDRALAZINE HCL 25 MG PO TABS: 25.0000 mg | ORAL_TABLET | Freq: Two times a day (BID) | ORAL | 1 refills | Status: DC

## 2019-07-06 ENCOUNTER — Encounter: Payer: Self-pay | Admitting: Internal Medicine

## 2019-07-06 ENCOUNTER — Ambulatory Visit (INDEPENDENT_AMBULATORY_CARE_PROVIDER_SITE_OTHER): Payer: Medicare Other | Admitting: Internal Medicine

## 2019-07-06 ENCOUNTER — Other Ambulatory Visit: Payer: Self-pay

## 2019-07-06 VITALS — BP 162/84 | HR 72 | Temp 97.4°F | Ht 75.0 in | Wt 239.6 lb

## 2019-07-06 DIAGNOSIS — N182 Chronic kidney disease, stage 2 (mild): Secondary | ICD-10-CM | POA: Diagnosis not present

## 2019-07-06 DIAGNOSIS — I129 Hypertensive chronic kidney disease with stage 1 through stage 4 chronic kidney disease, or unspecified chronic kidney disease: Secondary | ICD-10-CM

## 2019-07-06 DIAGNOSIS — E663 Overweight: Secondary | ICD-10-CM

## 2019-07-06 DIAGNOSIS — Z9114 Patient's other noncompliance with medication regimen: Secondary | ICD-10-CM

## 2019-07-06 DIAGNOSIS — Z6829 Body mass index (BMI) 29.0-29.9, adult: Secondary | ICD-10-CM | POA: Diagnosis not present

## 2019-07-06 NOTE — Patient Instructions (Signed)

## 2019-07-09 NOTE — Progress Notes (Signed)
Subjective:     Patient ID: Marcus Johnston , male    DOB: 1931/06/15 , 85 y.o.   MRN: [id number]   Chief Complaint  Patient presents with  . Hypertension    HPI  He is here today for a bp check. He reports compliance with meds, yet he has not taken meds today. He denies having any issues with carvedilol, recently increased by his cardiologist, Dr. Fraser Din.   Hypertension This is a chronic problem. The current episode started more than 1 year ago. The problem has been rapidly worsening since onset. Pertinent negatives include no blurred vision, chest pain, palpitations or shortness of breath. Risk factors for coronary artery disease include diabetes mellitus, dyslipidemia, male gender and sedentary lifestyle. Past treatments include beta blockers, angiotensin blockers and diuretics. The current treatment provides moderate improvement. Compliance problems include exercise.      Past Medical History:  Diagnosis Date  . Atrial fibrillation ([place])   . Atypical atrial flutter ([place]) 10/17/2018  . Diabetes mellitus without complication ([place])   . High cholesterol   . History of prostate cancer   . HTN (hypertension)   . Renal insufficiency      Family History  Problem Relation Age of Onset  . Cancer Mother   . Cancer Father      Current Outpatient Medications:  .  albuterol (PROVENTIL HFA;VENTOLIN HFA) 108 (90 Base) MCG/ACT inhaler, Inhale 2 puffs into the lungs every 4 (four) hours as needed for wheezing or shortness of breath (cough, shortness of breath or wheezing.)., Disp: 1 Inhaler, Rfl: 1 .  carvedilol (COREG) 12.5 MG tablet, Take 1 tablet (12.5 mg total) by mouth 2 (two) times daily with a meal., Disp: 60 tablet, Rfl: 2 .  Cholecalciferol (VITAMIN D3) 50 MCG (2000 UT) TABS, Take 2,000 Units by mouth daily., Disp: , Rfl:  .  doxazosin (CARDURA) 4 MG tablet, TAKE 1 TABLET BY MOUTH AT  BEDTIME, Disp: 90 tablet, Rfl: 3 .  EASY TOUCH INSULIN SYRINGE 30G X 5/16" 0.5 ML MISC, USE 3 TIMES  DAILY, Disp: 30 each, Rfl: 4 .  fexofenadine (ALLEGRA) 180 MG tablet, Take 180 mg by mouth daily as needed (allergies.). , Disp: , Rfl:  .  HUMALOG MIX 50/50 (50-50) 100 UNIT/ML SUSP injection, Inject 22-25 Units into the skin 2 (two) times daily. 25 units in the morning and 22 units in the afternoon, Disp: , Rfl:  .  hydrALAZINE (APRESOLINE) 25 MG tablet, Take 1 tablet (25 mg total) by mouth 2 (two) times daily., Disp: 180 tablet, Rfl: 1 .  ketorolac (ACULAR) 0.5 % ophthalmic solution, Place 1 drop into both eyes 4 (four) times daily. , Disp: , Rfl:  .  losartan-hydrochlorothiazide (HYZAAR) 100-25 MG tablet, TAKE 1 TABLET BY MOUTH  DAILY (Patient taking differently: Take 1 tablet by mouth daily. ), Disp: 90 tablet, Rfl: 3 .  metFORMIN (GLUCOPHAGE-XR) 500 MG 24 hr tablet, Take 1,000 mg by mouth 2 (two) times daily. , Disp: , Rfl:  .  prednisoLONE acetate (PRED FORTE) 1 % ophthalmic suspension, Place 1 drop into both eyes 3 (three) times daily., Disp: , Rfl:    Allergies  Allergen Reactions  . Statins Other (See Comments)    pain     Review of Systems  Constitutional: Negative.   Eyes: Negative for blurred vision.  Respiratory: Negative.  Negative for shortness of breath.   Cardiovascular: Negative.  Negative for chest pain and palpitations.  Gastrointestinal: Negative.   Neurological: Negative.  Psychiatric/Behavioral: Negative.      Today's Vitals   07/06/19 1206  BP: (!) 162/84  Pulse: 72  Temp: (!) 97.4 F (36.3 C)  TempSrc: Oral  Weight: 239 lb 9.6 oz (108.7 kg)  Height: 6\' 3"  (1.905 m)  PainSc: 0-No pain   Body mass index is 29.95 kg/m.   Objective:  Physical Exam Vitals signs and nursing note reviewed.  Constitutional:      Appearance: Normal appearance.  Cardiovascular:     Rate and Rhythm: Normal rate and regular rhythm.     Heart sounds: Normal heart sounds.  Pulmonary:     Effort: Pulmonary effort is normal.     Breath sounds: Normal breath sounds.   Skin:    General: Skin is warm.  Neurological:     General: No focal deficit present.     Mental Status: He is alert.  Psychiatric:        Mood and Affect: Mood normal.         Assessment And Plan:     1. Hypertensive nephropathy  Chronic, uncontrolled due to noncompliance. He is encouraged to take meds as directed.   2. Chronic renal disease, stage II  Chronic.   3. Overweight with body mass index (BMI) of 29 to 29.9 in adult  Again, importance of regular exercise was discussed with the patient.   [name], MD    THE PATIENT IS ENCOURAGED TO PRACTICE SOCIAL DISTANCING DUE TO THE COVID-19 PANDEMIC.

## 2019-07-18 DIAGNOSIS — H35353 Cystoid macular degeneration, bilateral: Secondary | ICD-10-CM | POA: Diagnosis not present

## 2019-07-18 DIAGNOSIS — H5213 Myopia, bilateral: Secondary | ICD-10-CM | POA: Diagnosis not present

## 2019-07-18 DIAGNOSIS — H52203 Unspecified astigmatism, bilateral: Secondary | ICD-10-CM | POA: Diagnosis not present

## 2019-07-18 DIAGNOSIS — H524 Presbyopia: Secondary | ICD-10-CM | POA: Diagnosis not present

## 2019-07-18 DIAGNOSIS — H40022 Open angle with borderline findings, high risk, left eye: Secondary | ICD-10-CM | POA: Diagnosis not present

## 2019-07-27 NOTE — Progress Notes (Signed)
[facility] Note  07/28/2019     CHIEF COMPLAINT Patient presents for Retina Evaluation   HISTORY OF PRESENT ILLNESS: Marcus Johnston is a 85 y.o. male who presents to the clinic today for:   HPI    Retina Evaluation    In both eyes.  This started 20 years ago.  Duration of 20 years.  Associated Symptoms Distortion.  Negative for Blind Spot, Glare, Shoulder/Hip pain, Fatigue, Jaw Claudication, Photophobia, Floaters, Redness, Scalp Tenderness, Weight Loss, Fever, Trauma, Pain and Flashes.  Context:  distance vision, mid-range vision and near vision.  Treatments tried include laser, injection and eye drops.  Response to treatment was no improvement.  I, the attending physician,  performed the HPI with the patient and updated documentation appropriately.          Comments    85 y/o male pt referred by Dr. [name] on 07/18/19 for eval of CME OU.  Pt first diagnosed w/CME OU 20 yrs ago, and despite numerous injections and lasers, pt's vision OU has continued to gradually deteriorate.  Pt hoping there is a better treatment option.  VA blurred OU cc.  Denies pain, flashes, floaters.  Using Ketorolac QID OU and Pred TID OU.  BS 100 this a.m. and A1C was 7.3 6 mos ago.       Last edited by [name], MD on 07/30/2019 12:09 AM. (History)    pt is here on the referral of Dr. Zenia Resides for concern on CME OU, pt has previously been seen by Dr. Zigmund Daniel in 2013, pt has also been seen by Dr. Armanda Heritage and Dr. Venetia Maxon at [facility] (last appt in 2019), pt states he has had a lot of injections, but does not feel like they have worked, pt is currently on PF and ketorolac which he got from [place], pt is diabetic and goes to [facility] for regular diabetic check ups, pt states he would like to get to the point where he can read, pt states he had laser sx for a partially detached retina in his right eye with Dr. Baird Cancer  Referring physician: [name],  MD [address] [place],  [place] [address]  HISTORICAL INFORMATION:   Selected notes from the MEDICAL RECORD NUMBER Referred by Dr. [name] for CME OU LEE: 12.01.20 ([facility] [place]) [BCVA: OD: 20/60-- OS: 20/150+] Ocular Hx-glc suspect (former pt of Dr. Sammie Bench / Dr. Venetia Maxon) PMH-A fib, DM (A1c: 7.9 [09.13.20] takes metformin and humalog), prostate cancer, HTN, HLD   CURRENT MEDICATIONS: Current Outpatient Medications (Ophthalmic Drugs)  Medication Sig  . ketorolac (ACULAR) 0.4 % SOLN Apply 1 drop to eye 4 (four) times daily.  . prednisoLONE acetate (PRED FORTE) 1 % ophthalmic suspension Place 1 drop into both eyes 3 (three) times daily.  Marland Kitchen ketorolac (ACULAR) 0.5 % ophthalmic solution Place 1 drop into both eyes 4 (four) times daily.    No current facility-administered medications for this visit. (Ophthalmic Drugs)   Current Outpatient Medications (Other)  Medication Sig  . albuterol (PROVENTIL HFA;VENTOLIN HFA) 108 (90 Base) MCG/ACT inhaler Inhale 2 puffs into the lungs every 4 (four) hours as needed for wheezing or shortness of breath (cough, shortness of breath or wheezing.).  Marland Kitchen CARTIA XT 120 MG 24 hr capsule   . carvedilol (COREG) 12.5 MG tablet Take 1 tablet (12.5 mg total) by mouth 2 (two) times daily with a meal.  . Cholecalciferol (VITAMIN D3) 50  MCG (2000 UT) TABS Take 2,000 Units by mouth daily.  Marland Kitchen doxazosin (CARDURA) 4 MG tablet TAKE 1 TABLET BY MOUTH AT  BEDTIME  . EASY TOUCH INSULIN SYRINGE 30G X 5/16" 0.5 ML MISC USE 3 TIMES DAILY  . fexofenadine (ALLEGRA) 180 MG tablet Take 180 mg by mouth daily as needed (allergies.).   Marland Kitchen HUMALOG MIX 50/50 (50-50) 100 UNIT/ML SUSP injection Inject 22-25 Units into the skin 2 (two) times daily. 25 units in the morning and 22 units in the afternoon  . hydrALAZINE (APRESOLINE) 25 MG tablet Take 1 tablet (25 mg total) by mouth 2 (two) times daily.  Marland Kitchen losartan-hydrochlorothiazide (HYZAAR) 100-25 MG tablet TAKE 1 TABLET BY MOUTH  DAILY  (Patient taking differently: Take 1 tablet by mouth daily. )  . metFORMIN (GLUCOPHAGE-XR) 500 MG 24 hr tablet Take 1,000 mg by mouth 2 (two) times daily.    No current facility-administered medications for this visit. (Other)      REVIEW OF SYSTEMS: ROS    Positive for: Neurological, Genitourinary, Endocrine, Cardiovascular, Eyes, Respiratory   Negative for: Constitutional, Gastrointestinal, Skin, Musculoskeletal, HENT, Psychiatric, Allergic/Imm, Heme/Lymph   Last edited by [name], COA on 07/28/2019  9:10 AM. (History)       ALLERGIES Allergies  Allergen Reactions  . Statins Other (See Comments)    pain    PAST MEDICAL HISTORY Past Medical History:  Diagnosis Date  . Atrial fibrillation ([place])   . Atypical atrial flutter ([place]) 10/17/2018  . Diabetes mellitus without complication ([place])   . High cholesterol   . History of prostate cancer   . HTN (hypertension)   . Renal insufficiency    Past Surgical History:  Procedure Laterality Date  . ATRIAL TACH ABLATION N/A 05/08/2019   Procedure: ATRIAL TACH ABLATION;  Surgeon: [name], MD;  Location: [facility];  Service: Cardiovascular;  Laterality: N/A;  . CATARACT EXTRACTION Bilateral   . EYE SURGERY Bilateral    Cat Sx  . prostate implant      FAMILY HISTORY Family History  Problem Relation Age of Onset  . Cancer Mother   . Cancer Father     SOCIAL HISTORY Social History   Tobacco Use  . Smoking status: Never Smoker  . Smokeless tobacco: Never Used  Substance Use Topics  . Alcohol use: No  . Drug use: No         OPHTHALMIC EXAM:  Base Eye Exam    Visual Acuity (Snellen - Linear)      Right Left   Dist cc 20/50 20/100 -   Dist ph cc 20/40 +2 NI   Correction: Glasses       Tonometry (Tonopen, 9:15 AM)      Right Left   Pressure 18 17       Pupils      Dark Light Shape React APD   Right 3 2 Round Brisk None   Left 3 2 Round Brisk None       Visual Fields (Counting  fingers)      Left Right     Full       Extraocular Movement      Right Left    Full, Ortho Full, Ortho       Neuro/Psych    Oriented x3: Yes   Mood/Affect: Normal       Dilation    Both eyes: 1.0% Mydriacyl, 2.5% Phenylephrine @ 9:15 AM        Slit Lamp  and Fundus Exam    Slit Lamp Exam      Right Left   Lids/Lashes Dermatochalasis - upper lid, mild Meibomian gland dysfunction Dermatochalasis - upper lid, mild Meibomian gland dysfunction   Conjunctiva/Sclera Mild Melanosis Mild Melanosis   Cornea Trace Punctate epithelial erosions 1+ Punctate epithelial erosions. endopigment   Anterior Chamber Deep and quiet Deep and quiet   Iris Round and dilated, No NVI Round and dilated, No NVI   Lens Toric PC IOL with marks at 1200 and 0600 PC IOL in good position with PC folds   Vitreous Vitreous syneresis Vitreous syneresis       Fundus Exam      Right Left   Disc Mild Pallor, mild, temporal Peripapillary atrophy Pink and Sharp, temporal Peripapillary atrophy, +cupping   C/D Ratio 0.6 0.65   Macula Blunted foveal reflex, central IRH, pigment clumping, Microaneurysms, +central cystic changes Blunted foveal reflex, Retinal pigment epithelial mottling and clumping, focal Exudates, +IRH, +central Cystic changes, scattered Microaneurysms   Vessels Vascular attenuation, mild Copper wiring, AV crossing changes Vascular attenuation, mild Copper wiring, AV crossing changes   Periphery Attached, large HST at 0500 with surrounding SRF, +pigmented barrier laser scar from 0430-0600    Attached, small pigmented operculum at 0730 midzone           Refraction    Wearing Rx      Sphere Cylinder Axis Add   Right -3.25 +1.00 142 +3.00   Left -3.50 +2.25 057 +3.00   Age: 32 wk   Type: PAL       Manifest Refraction      Sphere Cylinder Axis Dist VA   Right -3.25 +1.00 145 20/50+2   Left -3.50 +2.25 055 20/100          IMAGING AND PROCEDURES  Imaging and Procedures for @TODAY @  OCT,  Retina - OU - Both Eyes       Right Eye Quality was good. Central Foveal Thickness: 469. Progression has no prior data. Findings include abnormal foveal contour, intraretinal fluid, no SRF (Trace ERM).   Left Eye Quality was good. Central Foveal Thickness: 418. Progression has no prior data. Findings include intraretinal fluid, abnormal foveal contour, intraretinal hyper-reflective material, no SRF (Mild ERM).   Notes *Images captured and stored on drive  Diagnosis / Impression:  OD: abnormal foveal profile w/ +IRF -- macular edema (DME +/- CME component) OS: abnoraml foveal profile w/ +IRF/IRHM -- DME  Clinical management:  See below  Abbreviations: NFP - Normal foveal profile. CME - cystoid macular edema. PED - pigment epithelial detachment. IRF - intraretinal fluid. SRF - subretinal fluid. EZ - ellipsoid zone. ERM - epiretinal membrane. ORA - outer retinal atrophy. ORT - outer retinal tubulation. SRHM - subretinal hyper-reflective material        Fluorescein Angiography Optos (Transit OS)       Right Eye   Progression has no prior data. Early phase findings include window defect, microaneurysm. Mid/Late phase findings include leakage, microaneurysm (prominent leakage centrally, ?petaloid).   Left Eye   Progression has no prior data. Early phase findings include delayed filling, microaneurysm, staining, vascular perfusion defect. Mid/Late phase findings include microaneurysm, leakage, staining, vascular perfusion defect (prominent focal MA with leakage temporal macula).   Notes **Images stored on drive**  Impression: NPDR OU Leaking MA OU OS with scattered vascular perfusion defects OD central hyperfluorescent leakage -- ?CME component          Intravitreal Injection, Pharmacologic Agent - OS -  Left Eye       Time Out 07/28/2019. 11:31 AM. Confirmed correct patient, procedure, site, and patient consented.   Anesthesia Topical anesthesia was used.  Anesthetic medications included Lidocaine 2%, Proparacaine 0.5%.   Procedure Preparation included 5% betadine to ocular surface, eyelid speculum. A supplied (32g) needle was used.   Injection:  2 mg aflibercept Alfonse Flavors) SOLN   NDC: [id number], Lot: [id number], Expiration date: 09/13/2019   Route: Intravitreal, Site: Left Eye, Waste: 0.05 mL  Post-op Post injection exam found visual acuity of at least counting fingers. The patient tolerated the procedure well. There were no complications. The patient received written and verbal post procedure care education.   Notes **SAMPLE MEDICATION ADMINISTERED**       Intravitreal Injection, Pharmacologic Agent - OD - Right Eye       Time Out 07/28/2019. 11:33 AM. Confirmed correct patient, procedure, site, and patient consented.   Anesthesia Topical anesthesia was used. Anesthetic medications included Lidocaine 2%, Proparacaine 0.5%.   Procedure Preparation included 5% betadine to ocular surface, eyelid speculum. A supplied needle was used.   Injection:  1.25 mg Bevacizumab (AVASTIN) SOLN   NDC: [id number], Lot: [id number] , Expiration date: 08/23/2019   Route: Intravitreal, Site: Right Eye, Waste: 0 mL  Post-op Post injection exam found visual acuity of at least counting fingers. The patient tolerated the procedure well. There were no complications. The patient received written and verbal post procedure care education.                 ASSESSMENT/PLAN:    ICD-10-CM   1. Moderate nonproliferative diabetic retinopathy of both eyes with macular edema associated with type 2 diabetes mellitus (HCC)  14/01/2020 Intravitreal Injection, Pharmacologic Agent - OS - Left Eye    Intravitreal Injection, Pharmacologic Agent - OD - Right Eye    aflibercept (EYLEA) SOLN 2 mg    Bevacizumab (AVASTIN) SOLN 1.25 mg  2. Retinal edema  H35.81 OCT, Retina - OU - Both Eyes  3. History of retinal detachment  Z86.69   4. Essential hypertension  I10    5. Hypertensive retinopathy of both eyes  H35.033 Fluorescein Angiography Optos (Transit OS)  6. Pseudophakia of both eyes  Z96.1     1,2.  Non-proliferative diabetic retinopathy w/ macular edema OU  - patient with long standing history of macular edema -- was seen by Dr. [id number] back in 2013  - has also seen Dr. 2014, Dr. Baird Cancer, [place] service at [facility]  - last retina f/u in 2019 with Dr. 2020 and at [facility]  - pt has previously received intravitreal injections OU, but does not like them and felt like benefits from injections were minmal  - currently using PF and ketorolac QID OU  - The incidence, risk factors for progression, natural history and treatment options for diabetic retinopathy  were discussed with patient.    - The need for close monitoring of blood glucose, blood pressure, and serum lipids, avoiding cigarette or any type of tobacco, and the need for long term follow up was also discussed with patient.  - exam shows scattered/minimal MA but central edema OU  - FA (12.11.20) shows late leaking MA, OS shows capillary nonperfusion, OD with ?petaloid central hyperfluorescence -- ?CME component  - OCT shows macular edema, OU   - The natural history, pathology, and characteristics of diabetic macular edema discussed with patient.  A generalized discussion of the major clinical trials concerning treatment of diabetic macular edema (ETDRS, DCT, SCORE, RISE /  RIDE, and ongoing DRCR net studies) was completed.  This discussion included mention of the various approaches to treating diabetic macular edema (observation, laser photocoagulation, anti-VEGF injections with lucentis / Avastin / Eylea, steroid injections with Kenalog / Ozurdex, and intraocular surgery with vitrectomy).  The goal hemoglobin A1C of 6-7 was discussed, as well as importance of smoking cessation and hypertension control.  Need for ongoing treatment and monitoring were specifically discussed with reference to chronic  nature of diabetic macular edema.  - recommend IVA #1 OD and IVE OS #1 (sample) today, 12.11.20  - pt wishes to proceed  - RBA of procedure discussed, questions answered  - informed consent obtained and signed  - see procedure note  - Eylea4U benefits investigation initiated 12.11.20  - f/u in 4 wks -- DFE/OCT/possible injection  3. History of inferior HST w/ focal RD OD s/p laser retinopexy  - HST at 0500 w/ surrounding SRF  - good barricade laser in place spanning 0430-0600  - stable  4,5. Hypertensive retinopathy OU  - discussed importance of tight BP control  - monitor  6. Pseudophakia OU  - s/p CE/IOL OU  - beautiful surgeries, doing well  - monitor   Ophthalmic Meds Ordered this visit:  Meds ordered this encounter  Medications  . aflibercept (EYLEA) SOLN 2 mg  . Bevacizumab (AVASTIN) SOLN 1.25 mg       Return in about 4 weeks (around 08/25/2019) for f/u NPDR OU, DFE, OCT.  There are no Patient Instructions on file for this visit.   Explained the diagnoses, plan, and follow up with the patient and they expressed understanding.  Patient expressed understanding of the importance of proper follow up care.   This document serves as a record of services personally performed by Gardiner Sleeper, MD, PhD. It was created on their behalf by Ernest Mallick, OA, an ophthalmic assistant. The creation of this record is the provider's dictation and/or activities during the visit.    Electronically signed by: Ernest Mallick, OA 12.10.2020 12:38 AM   Gardiner Sleeper, M.D., Ph.D. Diseases & Surgery of the Retina and Vitreous [facility]  I have reviewed the above documentation for accuracy and completeness, and I agree with the above. Gardiner Sleeper, M.D., Ph.D. 07/30/19 12:38 AM     Abbreviations: M myopia (nearsighted); A astigmatism; H hyperopia (farsighted); P presbyopia; Mrx spectacle prescription;  CTL contact lenses; OD right eye; OS left eye; OU  both eyes  XT exotropia; ET esotropia; PEK punctate epithelial keratitis; PEE punctate epithelial erosions; DES dry eye syndrome; MGD meibomian gland dysfunction; ATs artificial tears; PFAT's preservative free artificial tears; [place] nuclear sclerotic cataract; PSC posterior subcapsular cataract; ERM epi-retinal membrane; PVD posterior vitreous detachment; RD retinal detachment; DM diabetes mellitus; DR diabetic retinopathy; NPDR non-proliferative diabetic retinopathy; PDR proliferative diabetic retinopathy; CSME clinically significant macular edema; DME diabetic macular edema; dbh dot blot hemorrhages; CWS cotton wool spot; POAG primary open angle glaucoma; C/D cup-to-disc ratio; HVF humphrey visual field; GVF goldmann visual field; OCT optical coherence tomography; IOP intraocular pressure; BRVO Branch retinal vein occlusion; CRVO central retinal vein occlusion; CRAO central retinal artery occlusion; BRAO branch retinal artery occlusion; RT retinal tear; SB scleral buckle; PPV pars plana vitrectomy; VH Vitreous hemorrhage; PRP panretinal laser photocoagulation; IVK intravitreal kenalog; VMT vitreomacular traction; MH Macular hole;  NVD neovascularization of the disc; NVE neovascularization elsewhere; AREDS age related eye disease study; ARMD age related macular degeneration; POAG primary open angle glaucoma; EBMD epithelial/anterior  basement membrane dystrophy; ACIOL anterior chamber intraocular lens; IOL intraocular lens; PCIOL posterior chamber intraocular lens; Phaco/IOL phacoemulsification with intraocular lens placement; [place] photorefractive keratectomy; LASIK laser assisted in situ keratomileusis; HTN hypertension; DM diabetes mellitus; COPD chronic obstructive pulmonary disease

## 2019-07-28 ENCOUNTER — Encounter (INDEPENDENT_AMBULATORY_CARE_PROVIDER_SITE_OTHER): Payer: Self-pay | Admitting: Ophthalmology

## 2019-07-28 ENCOUNTER — Ambulatory Visit (INDEPENDENT_AMBULATORY_CARE_PROVIDER_SITE_OTHER): Payer: Medicare Other | Admitting: Ophthalmology

## 2019-07-28 ENCOUNTER — Ambulatory Visit: Payer: Medicare Other | Admitting: Internal Medicine

## 2019-07-28 ENCOUNTER — Other Ambulatory Visit: Payer: Self-pay

## 2019-07-28 DIAGNOSIS — Z961 Presence of intraocular lens: Secondary | ICD-10-CM

## 2019-07-28 DIAGNOSIS — R404 Transient alteration of awareness: Secondary | ICD-10-CM | POA: Diagnosis not present

## 2019-07-28 DIAGNOSIS — Z8669 Personal history of other diseases of the nervous system and sense organs: Secondary | ICD-10-CM | POA: Diagnosis not present

## 2019-07-28 DIAGNOSIS — I10 Essential (primary) hypertension: Secondary | ICD-10-CM | POA: Diagnosis not present

## 2019-07-28 DIAGNOSIS — E161 Other hypoglycemia: Secondary | ICD-10-CM | POA: Diagnosis not present

## 2019-07-28 DIAGNOSIS — E162 Hypoglycemia, unspecified: Secondary | ICD-10-CM | POA: Diagnosis not present

## 2019-07-28 DIAGNOSIS — H3581 Retinal edema: Secondary | ICD-10-CM | POA: Diagnosis not present

## 2019-07-28 DIAGNOSIS — E113313 Type 2 diabetes mellitus with moderate nonproliferative diabetic retinopathy with macular edema, bilateral: Secondary | ICD-10-CM

## 2019-07-28 DIAGNOSIS — H35033 Hypertensive retinopathy, bilateral: Secondary | ICD-10-CM

## 2019-07-28 DIAGNOSIS — R069 Unspecified abnormalities of breathing: Secondary | ICD-10-CM | POA: Diagnosis not present

## 2019-07-30 ENCOUNTER — Encounter (INDEPENDENT_AMBULATORY_CARE_PROVIDER_SITE_OTHER): Payer: Self-pay | Admitting: Ophthalmology

## 2019-07-30 MED ORDER — BEVACIZUMAB CHEMO INJECTION 1.25MG/0.05ML SYRINGE FOR KALEIDOSCOPE
1.2500 mg | INTRAVITREAL | Status: AC | PRN
  Administered 2019-07-30: 1.25 mg via INTRAVITREAL

## 2019-07-30 MED ORDER — AFLIBERCEPT 2MG/0.05ML IZ SOLN FOR KALEIDOSCOPE
2.0000 mg | INTRAVITREAL | Status: AC | PRN
  Administered 2019-07-30: 2 mg via INTRAVITREAL

## 2019-07-31 DIAGNOSIS — I10 Essential (primary) hypertension: Secondary | ICD-10-CM | POA: Diagnosis not present

## 2019-07-31 DIAGNOSIS — E1122 Type 2 diabetes mellitus with diabetic chronic kidney disease: Secondary | ICD-10-CM | POA: Diagnosis not present

## 2019-07-31 DIAGNOSIS — N1831 Chronic kidney disease, stage 3a: Secondary | ICD-10-CM | POA: Diagnosis not present

## 2019-07-31 DIAGNOSIS — Z794 Long term (current) use of insulin: Secondary | ICD-10-CM | POA: Diagnosis not present

## 2019-08-08 ENCOUNTER — Telehealth: Payer: Self-pay

## 2019-08-08 NOTE — Telephone Encounter (Signed)
I don't think backache is related to coreg. Dizziness could possibly be. Recommend taking 1 tab once a day instead twice a day. Will discuss more during the upcoming visit on 12/28.  Thanks MJP

## 2019-08-08 NOTE — Telephone Encounter (Signed)
Telephone encounter:  Reason for call: since starting Carvedilol he has a constant ache and pain in his back and dizziness.   Usual provider: MP  Last office visit: 06/19/19  Next office visit: 12/04/19   Last hospitalization: 05/08/19   Current Outpatient Medications on File Prior to Visit  Medication Sig Dispense Refill  . albuterol (PROVENTIL HFA;VENTOLIN HFA) 108 (90 Base) MCG/ACT inhaler Inhale 2 puffs into the lungs every 4 (four) hours as needed for wheezing or shortness of breath (cough, shortness of breath or wheezing.). 1 Inhaler 1  . CARTIA XT 120 MG 24 hr capsule     . carvedilol (COREG) 12.5 MG tablet Take 1 tablet (12.5 mg total) by mouth 2 (two) times daily with a meal. 60 tablet 2  . Cholecalciferol (VITAMIN D3) 50 MCG (2000 UT) TABS Take 2,000 Units by mouth daily.    Marland Kitchen doxazosin (CARDURA) 4 MG tablet TAKE 1 TABLET BY MOUTH AT  BEDTIME 90 tablet 3  . EASY TOUCH INSULIN SYRINGE 30G X 5/16" 0.5 ML MISC USE 3 TIMES DAILY 30 each 4  . fexofenadine (ALLEGRA) 180 MG tablet Take 180 mg by mouth daily as needed (allergies.).     Marland Kitchen HUMALOG MIX 50/50 (50-50) 100 UNIT/ML SUSP injection Inject 22-25 Units into the skin 2 (two) times daily. 25 units in the morning and 22 units in the afternoon    . hydrALAZINE (APRESOLINE) 25 MG tablet Take 1 tablet (25 mg total) by mouth 2 (two) times daily. 180 tablet 1  . ketorolac (ACULAR) 0.4 % SOLN Apply 1 drop to eye 4 (four) times daily.    Marland Kitchen ketorolac (ACULAR) 0.5 % ophthalmic solution Place 1 drop into both eyes 4 (four) times daily.     Marland Kitchen losartan-hydrochlorothiazide (HYZAAR) 100-25 MG tablet TAKE 1 TABLET BY MOUTH  DAILY (Patient taking differently: Take 1 tablet by mouth daily. ) 90 tablet 3  . metFORMIN (GLUCOPHAGE-XR) 500 MG 24 hr tablet Take 1,000 mg by mouth 2 (two) times daily.     . prednisoLONE acetate (PRED FORTE) 1 % ophthalmic suspension Place 1 drop into both eyes 3 (three) times daily.     No current facility-administered  medications on file prior to visit.

## 2019-08-08 NOTE — Telephone Encounter (Signed)
Patient agreed to take one per day and follow up with MP next week.

## 2019-08-09 ENCOUNTER — Ambulatory Visit (INDEPENDENT_AMBULATORY_CARE_PROVIDER_SITE_OTHER): Payer: Medicare Other

## 2019-08-09 ENCOUNTER — Encounter (HOSPITAL_COMMUNITY): Payer: Self-pay

## 2019-08-09 ENCOUNTER — Ambulatory Visit (HOSPITAL_COMMUNITY)
Admission: EM | Admit: 2019-08-09 | Discharge: 2019-08-09 | Disposition: A | Discharge status: Home / Self Care | Payer: Medicare Other | Attending: Family Medicine | Admitting: Family Medicine

## 2019-08-09 ENCOUNTER — Other Ambulatory Visit: Payer: Self-pay

## 2019-08-09 DIAGNOSIS — B029 Zoster without complications: Secondary | ICD-10-CM

## 2019-08-09 DIAGNOSIS — R079 Chest pain, unspecified: Secondary | ICD-10-CM | POA: Diagnosis not present

## 2019-08-09 MED ORDER — PREDNISONE 20 MG PO TABS: ORAL_TABLET | ORAL | 0 refills | Status: DC

## 2019-08-09 MED ORDER — GABAPENTIN 300 MG PO CAPS: 300.0000 mg | ORAL_CAPSULE | Freq: Every day | ORAL | 0 refills | Status: DC

## 2019-08-09 NOTE — Discharge Instructions (Addendum)
You have shingles which is a recurrence of the the chickenpox virus along the nerve under the rib.

## 2019-08-09 NOTE — ED Notes (Signed)
174/78 reported to Dr. Carlean Jews .. pt has been trying regulate his on B/P meds.

## 2019-08-09 NOTE — ED Provider Notes (Signed)
[place]    CSN: [id number] Arrival date & time: 08/09/19  1137      History   Chief Complaint Chief Complaint  Patient presents with  . Chest Pain    HPI Marcus Johnston is a 85 y.o. male.   Is an 85 year old established [place] [facility] urgent care patient who presents with chest pain for the last week.  He says the pain has been somewhat intermittent but it does keep him awake at night.  It is primarily left-sided and sharp.  He gets intermittent stabbing pains in the sternum as well.  He has had no nausea or vomiting, no shortness of breath, and no leg swelling.  There is been no fever or cough.  Patient had an atrial lesion for atrial tachycardia last September.  He has had a dull ache since then but this pain is worse last week.     Past Medical History:  Diagnosis Date  . Atrial fibrillation ([place])   . Atypical atrial flutter ([place]) 10/17/2018  . Diabetes mellitus without complication ([place])   . High cholesterol   . History of prostate cancer   . HTN (hypertension)   . Renal insufficiency     Patient Active Problem List   Diagnosis Date Noted  . Groin swelling 05/17/2019  . Bruit (arterial) 05/17/2019  . SVT (supraventricular tachycardia) ([place]) 05/08/2019  . Atrial tachycardia ([place]) 05/02/2019  . Bradycardia 04/30/2019  . Syncope 04/30/2019  . Sick sinus syndrome ([place]) 04/30/2019  . Syncope and collapse 04/29/2019  . Leg edema 10/17/2018  . Type 2 diabetes mellitus with other circulatory complications ([place]) 0000000  . Hyperglycemia   . history of TIA (transient ischemic attack) 11/12/2016  . Essential hypertension 11/12/2016  . DM (diabetes mellitus) ([place]) 11/12/2016  . Renal insufficiency 11/12/2016    Past Surgical History:  Procedure Laterality Date  . ATRIAL TACH ABLATION N/A 05/08/2019   Procedure: ATRIAL TACH ABLATION;  Surgeon: [name], MD;  Location: [facility];  Service: Cardiovascular;  Laterality: N/A;  . CATARACT  EXTRACTION Bilateral   . EYE SURGERY Bilateral    Cat Sx  . prostate implant         Home Medications    Prior to Admission medications   Medication Sig Start Date End Date Taking? Authorizing Provider  albuterol (PROVENTIL HFA;VENTOLIN HFA) 108 (90 Base) MCG/ACT inhaler Inhale 2 puffs into the lungs every 4 (four) hours as needed for wheezing or shortness of breath (cough, shortness of breath or wheezing.). 08/04/18   Robyn Haber, MD  CARTIA XT 120 MG 24 hr capsule  07/18/19   [provider]  carvedilol (COREG) 12.5 MG tablet Take 1 tablet (12.5 mg total) by mouth 2 (two) times daily with a meal. 06/19/19   Patwardhan, Manish J, MD  Cholecalciferol (VITAMIN D3) 50 MCG (2000 UT) TABS Take 2,000 Units by mouth daily.    [provider]  doxazosin (CARDURA) 4 MG tablet TAKE 1 TABLET BY MOUTH AT  BEDTIME 06/16/19   Glendale Chard, MD  EASY [facility] INSULIN SYRINGE 30G X 5/16" 0.5 ML MISC USE 3 TIMES DAILY 11/07/18   Glendale Chard, MD  fexofenadine (ALLEGRA) 180 MG tablet Take 180 mg by mouth daily as needed (allergies.).     [provider]  gabapentin (NEURONTIN) 300 MG capsule Take 1 capsule (300 mg total) by mouth at bedtime. 08/09/19   Robyn Haber, MD  HUMALOG MIX 50/50 (50-50) 100 UNIT/ML SUSP injection Inject 22-25 Units  into the skin 2 (two) times daily. 25 units in the morning and 22 units in the afternoon 08/17/18   [provider]  hydrALAZINE (APRESOLINE) 25 MG tablet Take 1 tablet (25 mg total) by mouth 2 (two) times daily. 06/26/19   Glendale Chard, MD  ketorolac (ACULAR) 0.4 % SOLN Apply 1 drop to eye 4 (four) times daily. 07/18/19   [provider]  ketorolac (ACULAR) 0.5 % ophthalmic solution Place 1 drop into both eyes 4 (four) times daily.  08/17/18   [provider]  losartan-hydrochlorothiazide (HYZAAR) 100-25 MG tablet TAKE 1 TABLET BY MOUTH  DAILY Patient taking differently: Take 1 tablet by mouth daily.  04/10/19    Glendale Chard, MD  metFORMIN (GLUCOPHAGE-XR) 500 MG 24 hr tablet Take 1,000 mg by mouth 2 (two) times daily.     [provider]  prednisoLONE acetate (PRED FORTE) 1 % ophthalmic suspension Place 1 drop into both eyes 3 (three) times daily. 04/11/19   [provider]  predniSONE (DELTASONE) 20 MG tablet Two daily with food 08/09/19   Robyn Haber, MD    Family History Family History  Problem Relation Age of Onset  . Cancer Mother   . Cancer Father     Social History Social History   Tobacco Use  . Smoking status: Never Smoker  . Smokeless tobacco: Never Used  Substance Use Topics  . Alcohol use: No  . Drug use: No     Allergies   Statins   Review of Systems Review of Systems  Cardiovascular: Positive for chest pain.  All other systems reviewed and are negative.    Physical Exam Triage Vital Signs ED Triage Vitals  Enc Vitals Group     BP 08/09/19 1152 (!) 174/78     Pulse Rate 08/09/19 1152 83     Resp 08/09/19 1152 18     Temp 08/09/19 1152 97.8 F (36.6 C)     Temp Source 08/09/19 1152 Oral     SpO2 08/09/19 1152 100 %     Weight 08/09/19 1151 240 lb (108.9 kg)     Height --      Head Circumference --      Peak Flow --      Pain Score 08/09/19 1151 4     Pain Loc --      Pain Edu? --      Excl. in [place]? --    No data found.  Updated Vital Signs BP (!) 174/78 (BP Location: Right Arm)   Pulse 83   Temp 97.8 F (36.6 C) (Oral)   Resp 18   Wt 108.9 kg   SpO2 100%   BMI 30.00 kg/m    Physical Exam Vitals and nursing note reviewed.  Constitutional:      General: He is not in acute distress.    Appearance: He is well-developed and normal weight. He is not ill-appearing or toxic-appearing.  HENT:     Head: Normocephalic.  Eyes:     Pupils: Pupils are equal, round, and reactive to light.  Cardiovascular:     Rate and Rhythm: Normal rate and regular rhythm.     Heart sounds: Normal heart sounds.  Pulmonary:     Effort:  Pulmonary effort is normal.     Breath sounds: Normal breath sounds.  Chest:     Chest wall: Tenderness present.     Comments: Tender skin over 5th left rib Abdominal:     Palpations: Abdomen is soft.  Tenderness: There is no abdominal tenderness.  Musculoskeletal:        General: Normal range of motion.     Cervical back: Normal range of motion and neck supple.  Skin:    General: Skin is warm and dry.     Findings: Rash present.     Comments: Dry vesicular rash left 5th thoracic dermatome  Neurological:     General: No focal deficit present.     Mental Status: He is alert.  Psychiatric:        Mood and Affect: Mood normal.        Behavior: Behavior normal.      UC Treatments / Results  Labs (all labs ordered are listed, but only abnormal results are displayed) Labs Reviewed - No data to display  EKG   Radiology DG Chest 2 View  Result Date: 08/09/2019 CLINICAL DATA:  Left-sided chest pain, back pain EXAM: CHEST - 2 VIEW COMPARISON:  06/24/2005 FINDINGS: There is lingular atelectasis. There is no focal consolidation. There is no pleural effusion or pneumothorax. The heart and mediastinal contours are unremarkable. There is no acute osseous abnormality. IMPRESSION: No active cardiopulmonary disease. Electronically Signed   By: Kathreen Devoid   On: 08/09/2019 12:36    Procedures Procedures (including critical care time)  Medications Ordered in UC Medications - No data to display  Initial Impression / Assessment and Plan / UC Course  I have reviewed the triage vital signs and the nursing notes.  Pertinent labs & imaging results that were available during my care of the patient were reviewed by me and considered in my medical decision making (see chart for details).    Final Clinical Impressions(s) / UC Diagnoses   Final diagnoses:  Herpes zoster without complication     Discharge Instructions     You have shingles which is a recurrence of the the chickenpox  virus along the nerve under the rib.    ED Prescriptions    Medication Sig Dispense Auth. Provider   predniSONE (DELTASONE) 20 MG tablet Two daily with food 10 tablet Robyn Haber, MD   gabapentin (NEURONTIN) 300 MG capsule Take 1 capsule (300 mg total) by mouth at bedtime. 21 capsule Robyn Haber, MD     I have reviewed the PDMP during this encounter.   Robyn Haber, MD 08/09/19 1242

## 2019-08-09 NOTE — ED Triage Notes (Signed)
Pt states he has had chest pain for a week now. Pt states he spoke to his Cardiologist yesterday. Pt has appt with that doctor on Monday.

## 2019-08-12 NOTE — Progress Notes (Signed)
Subjective:   Marcus Johnston, male    DOB: 02-07-1931, 85 y.o.   MRN: [id number]   Chief Complaint  Patient presents with  . Hypertension  . Tachycardia  . Follow-up    72mo  HPI  85year old African-American male with hypertension, hyperlipidemia, type 2 diabetes mellitus, h/o CVA, CKD 3, atrial tachycardia status post ablation.  Patient was recently seen in the ED for chest pain, was found to have Herpes rash in thoracic  Dermatome distribution. Pain has improved after prednisone use.   Blood pressure is elevated today. He has cut down carvedilol due to dizziness, but dizziness has now subsided.   Past Medical History:  Diagnosis Date  . Atrial fibrillation ([place]   . Atypical atrial flutter ([place] 10/17/2018  . Diabetes mellitus without complication ([place]   . High cholesterol   . History of prostate cancer   . HTN (hypertension)   . Renal insufficiency      Past Surgical History:  Procedure Laterality Date  . ATRIAL TACH ABLATION N/A 05/08/2019   Procedure: ATRIAL TACH ABLATION;  Surgeon: TEvans Lance MD;  [facility];  Service: Cardiovascular;  Laterality: N/A;  . CATARACT EXTRACTION Bilateral   . EYE SURGERY Bilateral    Cat Sx  . prostate implant       Social History   Socioeconomic History  . Marital status: Married    Spouse name: Not on file  . Number of children: 5  . Years of education: Not on file  . Highest education level: Not on file  Occupational History  . Occupation: retired  Tobacco Use  . Smoking status: Never Smoker  . Smokeless tobacco: Never Used  Substance and Sexual Activity  . Alcohol use: No  . Drug use: No  . Sexual activity: Yes  Other Topics Concern  . Not on file  Social History Narrative  . Not on file   Social Determinants of Health   Financial Resource Strain: Low Risk   . Difficulty of Paying Living Expenses: Not hard at all  Food Insecurity: No Food Insecurity  . Worried About RShip brokerin the Last Year: Never true  . Ran Out of Food in the Last Year: Never true  Transportation Needs: No Transportation Needs  . Lack of Transportation (Medical): No  . Lack of Transportation (Non-Medical): No  Physical Activity: Inactive  . Days of Exercise per Week: 0 days  . Minutes of Exercise per Session: 0 min  Stress: No Stress Concern Present  . Feeling of Stress : Not at all  Social Connections:   . Frequency of Communication with Friends and Family: Not on file  . Frequency of Social Gatherings with Friends and Family: Not on file  . Attends Religious Services: Not on file  . Active Member of Clubs or Organizations: Not on file  . Attends CArchivistMeetings: Not on file  . Marital Status: Not on file  Intimate Partner Violence: Not At Risk  . Fear of Current or Ex-Partner: No  . Emotionally Abused: No  . Physically Abused: No  . Sexually Abused: No     Current Outpatient Medications on File Prior to Visit  Medication Sig Dispense Refill  . albuterol (PROVENTIL HFA;VENTOLIN HFA) 108 (90 Base) MCG/ACT inhaler Inhale 2 puffs into the lungs every 4 (four) hours as needed for wheezing or shortness of breath (cough, shortness of breath or wheezing.). 1 Inhaler 1  .  carvedilol (COREG) 12.5 MG tablet Take 1 tablet (12.5 mg total) by mouth 2 (two) times daily with a meal. (Patient taking differently: Take by mouth. 6.25 in the am, 12.5 at bedtime) 60 tablet 2  . Cholecalciferol (VITAMIN D3) 50 MCG (2000 UT) TABS Take 2,000 Units by mouth daily.    Marland Kitchen doxazosin (CARDURA) 4 MG tablet TAKE 1 TABLET BY MOUTH AT  BEDTIME 90 tablet 3  . fexofenadine (ALLEGRA) 180 MG tablet Take 180 mg by mouth daily as needed (allergies.).     Marland Kitchen gabapentin (NEURONTIN) 300 MG capsule Take 1 capsule (300 mg total) by mouth at bedtime. 21 capsule 0  . HUMALOG MIX 50/50 (50-50) 100 UNIT/ML SUSP injection Inject 22-25 Units into the skin 2 (two) times daily. 25 units in the morning and 22 units  in the afternoon    . ketorolac (ACULAR) 0.5 % ophthalmic solution Place 1 drop into both eyes 4 (four) times daily.     Marland Kitchen losartan-hydrochlorothiazide (HYZAAR) 100-25 MG tablet TAKE 1 TABLET BY MOUTH  DAILY (Patient taking differently: Take 1 tablet by mouth daily. ) 90 tablet 3  . metFORMIN (GLUCOPHAGE-XR) 500 MG 24 hr tablet Take 1,000 mg by mouth 2 (two) times daily.     . prednisoLONE acetate (PRED FORTE) 1 % ophthalmic suspension Place 1 drop into both eyes 3 (three) times daily.    Marland Kitchen EASY TOUCH INSULIN SYRINGE 30G X 5/16" 0.5 ML MISC USE 3 TIMES DAILY 30 each 4   No current facility-administered medications on file prior to visit.    Cardiovascular studies:  Lower Extremity Arterial Duplex 05/26/2019: Moderate velocity increase at the right mid anterior tibial artery suggests >50% stenosis.  No hemodynamically significant stenosis is identified in the left lower extremity arterial system. Non-compressible ABI on the right, suggestive of medial calcinosis. Non-compressible ABI on the left, suggestive of medial calcinosis.  No pseudoaneurysm noted in bilateral goins/femoral arteries.  EKG 05/17/2019: Sinus rhythm 67 bpm. Right atrial enlargement.  Poor R-wave progression.  Nonspecific T-abnormality.   Atrial tachycardia ablation 05/08/2019, Dr. Crissie Sickles: Conclusion: Successful EP study and catheter ablation of an atrial tachycardia originating in the para-hisian region of the right atrium but being successfully ablated in the noncoronary cusp of the aortic root.  Echocardiogram 01/20/2019: Normal LV systolic function with EF 59%. Left ventricle cavity is normal in size. Moderate concentric hypertrophy of the left ventricle. Normal global wall motion. Doppler evidence of grade I (impaired) diastolic dysfunction, normal LAP. Calculated EF 59%. Moderate (Grade II) mitral regurgitation. Moderate tricuspid regurgitation. Estimated pulmonary artery systolic pressure 40 mmHg.   EKG  11/07/2018: Sinus rhythm 59 bpm RIght atrial enlargement Left axis deviation.  Baseline artifact.   EKG 10/17/2018: Atypical atrial flutter with RVR 160 bpm. Nonspecific ST-T abnormality  Vascular US Carotid Bilateral 11/14/2016: intimal wall thickness CCA. 1-39% ICA plaquing. Vertebral artery flow is antegrade.  MRI Brain 11/12/2016: 1. No acute intracranial abnormality identified. 2. Moderate chronic microvascular ischemic changes and moderate parenchymal volume loss of the brain. 3. Scattered punctate foci of susceptibility hypointensity compatible with hemosiderin deposition, probably related to chronic hypertension, less likely cerebral amyloid angiopathy. 4. Normal MRA of the head.  Treadmill stress test 08/26/2015: Comments: Indication: Tachycardia The patient exercised according to Bruce Protocol, Total time recorded 6:01 min achieving max heart rate of 164 which was 95 % of MPHR for age and 7.05 METS of work. Normal BP response. Resting ECG showing tachycardia. @ 105/min. There was no ST-T changes of ischemia  with exercise stress test. No arrythmias noted during exercise. Stress terminated due to shortness of breath and THR >85% met.  Labs: Results for KEYSHON, STEIN (MRN [id number]) as of 06/05/2019 10:39  Ref. Range 05/02/2019 02:51  Sodium Latest Ref Range: 135 - 145 mmol/L 139  Potassium Latest Ref Range: 3.5 - 5.1 mmol/L 3.9  Chloride Latest Ref Range: 98 - 111 mmol/L 105  CO2 Latest Ref Range: 22 - 32 mmol/L 20 (L)  Glucose Latest Ref Range: 70 - 99 mg/dL 102 (H)  BUN Latest Ref Range: 8 - 23 mg/dL 18  Creatinine Latest Ref Range: 0.61 - 1.24 mg/dL 1.21  Calcium Latest Ref Range: 8.9 - 10.3 mg/dL 9.0  Anion gap Latest Ref Range: 5 - 15  14  GFR, Est Non African American Latest Ref Range: >60 mL/min 54 (L)  GFR, Est African American Latest Ref Range: >60 mL/min >60   Results for ERBY, SANDERSON (MRN [id number]) as of 06/05/2019 10:39  Ref. Range 05/02/2019 06:30    WBC Latest Ref Range: 4.0 - 10.5 K/uL 4.8  RBC Latest Ref Range: 4.22 - 5.81 MIL/uL 4.40  Hemoglobin Latest Ref Range: 13.0 - 17.0 g/dL 12.7 (L)  HCT Latest Ref Range: 39.0 - 52.0 % 36.6 (L)  MCV Latest Ref Range: 80.0 - 100.0 fL 83.2  MCH Latest Ref Range: 26.0 - 34.0 pg 28.9  MCHC Latest Ref Range: 30.0 - 36.0 g/dL 34.7  RDW Latest Ref Range: 11.5 - 15.5 % 15.4  Platelets Latest Ref Range: 150 - 400 K/uL 174  nRBC Latest Ref Range: 0.0 - 0.2 % 0.0    Review of Systems  Constitution: Negative for decreased appetite, malaise/fatigue, weight gain and weight loss.  HENT: Negative for congestion.   Eyes: Negative for visual disturbance.  Cardiovascular: Negative for chest pain, dyspnea on exertion, leg swelling, palpitations and syncope.       Right groin swelling  Respiratory: Negative for cough.   Endocrine: Negative for cold intolerance.  Hematologic/Lymphatic: Does not bruise/bleed easily.  Skin: Negative for itching and rash.  Musculoskeletal: Negative for myalgias.  Gastrointestinal: Negative for abdominal pain, nausea and vomiting.  Genitourinary: Negative for dysuria.  Neurological: Negative for dizziness and weakness.  Psychiatric/Behavioral: The patient is not nervous/anxious.   All other systems reviewed and are negative.         Vitals:   08/14/19 1035 08/14/19 1106  BP: (!) 204/98 (!) 184/81  Pulse: 71 71  Temp: 97.9 F (36.6 C)   SpO2: 96% 97%    Objective:  Physical Exam  Constitutional: He is oriented to person, place, and time. He appears well-developed and well-nourished. No distress.  HENT:  Head: Normocephalic and atraumatic.  Eyes: Pupils are equal, round, and reactive to light. Conjunctivae are normal.  Neck: No JVD present.  Cardiovascular: Normal rate, regular rhythm and intact distal pulses.  No murmur heard. Pulses:      Femoral pulses are 2+ on the right side. Right groin swelling has resolved  Pulmonary/Chest: Effort normal and breath  sounds normal. He has no wheezes. He has no rales.  Abdominal: Soft. Bowel sounds are normal. There is no rebound.  Musculoskeletal:        General: No edema.  Lymphadenopathy:    He has no cervical adenopathy.  Neurological: He is alert and oriented to person, place, and time. No cranial nerve deficit.  Skin: Skin is warm and dry.  Psychiatric: He has a normal mood and affect.  Nursing note  and vitals reviewed.          Assessment & Recommendations:   85 year old African-American male with hypertension, hyperlipidemia, type 2 diabetes mellitus, h/o CVA, CKD 3, atrial tachycardia status post ablation, recent steroid use for Herpes Zoster.  Paroxysmal atrial tachycardia: Status post ablation by Dr. Lovena Le on 05/08/2019. No recurrence.  Essential hypertension Remains uncontrolled. May be worsened by recent steroid use, and reducing dose of carvedilol. He is willing to try carvedilol again at 12.5 mg bid. BP check visit in 10-14 days. If controlled, can see once every 6-12 months.   Nigel Mormon, MD [facility]. [place] Pager: [phone] Office: [phone] If no answer Cell [phone]

## 2019-08-14 ENCOUNTER — Ambulatory Visit: Payer: Medicare Other | Admitting: Cardiology

## 2019-08-14 ENCOUNTER — Encounter: Payer: Self-pay | Admitting: Cardiology

## 2019-08-14 ENCOUNTER — Other Ambulatory Visit: Payer: Self-pay

## 2019-08-14 VITALS — BP 184/81 | HR 71 | Temp 97.9°F | Ht 75.0 in | Wt 240.6 lb

## 2019-08-14 DIAGNOSIS — I10 Essential (primary) hypertension: Secondary | ICD-10-CM | POA: Diagnosis not present

## 2019-08-14 DIAGNOSIS — I471 Supraventricular tachycardia: Secondary | ICD-10-CM | POA: Diagnosis not present

## 2019-08-25 ENCOUNTER — Encounter (INDEPENDENT_AMBULATORY_CARE_PROVIDER_SITE_OTHER): Payer: Medicare Other | Admitting: Ophthalmology

## 2019-08-27 DIAGNOSIS — E1165 Type 2 diabetes mellitus with hyperglycemia: Secondary | ICD-10-CM | POA: Diagnosis not present

## 2019-08-27 NOTE — Progress Notes (Signed)
Subjective:   Marcus Johnston, male    DOB: Mar 07, 1931, 86 y.o.   MRN: [id number]   Chief Complaint  Patient presents with  . Hypertension    2wk FU   HPI  86 year old African-American male with hypertension, hyperlipidemia, type 2 diabetes mellitus, h/o CVA, CKD 3, atrial tachycardia status post ablation, recent steroid use for Herpes Zoster.  His blood pressure is elevated in the office today. His BP at home is much lower. He also reports lightheadedness on standing up. Orthostatics negative today.   Current Outpatient Medications on File Prior to Visit  Medication Sig Dispense Refill  . albuterol (PROVENTIL HFA;VENTOLIN HFA) 108 (90 Base) MCG/ACT inhaler Inhale 2 puffs into the lungs every 4 (four) hours as needed for wheezing or shortness of breath (cough, shortness of breath or wheezing.). 1 Inhaler 1  . carvedilol (COREG) 12.5 MG tablet Take 1 tablet (12.5 mg total) by mouth 2 (two) times daily with a meal. (Patient taking differently: Take by mouth. 6.25 in the am, 12.5 at bedtime) 60 tablet 2  . Cholecalciferol (VITAMIN D3) 50 MCG (2000 UT) TABS Take 1,000 Units by mouth daily.     Marland Kitchen doxazosin (CARDURA) 4 MG tablet TAKE 1 TABLET BY MOUTH AT  BEDTIME 90 tablet 3  . EASY TOUCH INSULIN SYRINGE 30G X 5/16" 0.5 ML MISC USE 3 TIMES DAILY 30 each 4  . fexofenadine (ALLEGRA) 180 MG tablet Take 180 mg by mouth daily as needed (allergies.).     Marland Kitchen gabapentin (NEURONTIN) 300 MG capsule Take 1 capsule (300 mg total) by mouth at bedtime. 21 capsule 0  . HUMALOG MIX 50/50 (50-50) 100 UNIT/ML SUSP injection Inject 22-25 Units into the skin 2 (two) times daily. 25 units in the morning and 22 units in the afternoon    . ketorolac (ACULAR) 0.5 % ophthalmic solution Place 1 drop into both eyes 4 (four) times daily.     . metFORMIN (GLUCOPHAGE-XR) 500 MG 24 hr tablet Take 1,000 mg by mouth 2 (two) times daily.     . prednisoLONE acetate (PRED FORTE) 1 % ophthalmic suspension Place 1 drop into  both eyes 3 (three) times daily.    Marland Kitchen losartan-hydrochlorothiazide (HYZAAR) 100-25 MG tablet TAKE 1 TABLET BY MOUTH  DAILY (Patient not taking: No sig reported) 90 tablet 3   No current facility-administered medications on file prior to visit.    Cardiovascular studies:  Lower Extremity Arterial Duplex 05/26/2019: Moderate velocity increase at the right mid anterior tibial artery suggests >50% stenosis.  No hemodynamically significant stenosis is identified in the left lower extremity arterial system. Non-compressible ABI on the right, suggestive of medial calcinosis. Non-compressible ABI on the left, suggestive of medial calcinosis.  No pseudoaneurysm noted in bilateral goins/femoral arteries.  EKG 05/17/2019: Sinus rhythm 67 bpm. Right atrial enlargement.  Poor R-wave progression.  Nonspecific T-abnormality.   Atrial tachycardia ablation 05/08/2019, Dr. Crissie Sickles: Conclusion: Successful EP study and catheter ablation of an atrial tachycardia originating in the para-hisian region of the right atrium but being successfully ablated in the noncoronary cusp of the aortic root.  Echocardiogram 01/20/2019: Normal LV systolic function with EF 59%. Left ventricle cavity is normal in size. Moderate concentric hypertrophy of the left ventricle. Normal global wall motion. Doppler evidence of grade I (impaired) diastolic dysfunction, normal LAP. Calculated EF 59%. Moderate (Grade II) mitral regurgitation. Moderate tricuspid regurgitation. Estimated pulmonary artery systolic pressure 40 mmHg.   EKG 11/07/2018: Sinus rhythm 59 bpm RIght atrial enlargement  Left axis deviation.  Baseline artifact.   EKG 10/17/2018: Atypical atrial flutter with RVR 160 bpm. Nonspecific ST-T abnormality  Vascular US Carotid Bilateral 11/14/2016: intimal wall thickness CCA. 1-39% ICA plaquing. Vertebral artery flow is antegrade.  MRI Brain 11/12/2016: 1. No acute intracranial abnormality identified. 2.  Moderate chronic microvascular ischemic changes and moderate parenchymal volume loss of the brain. 3. Scattered punctate foci of susceptibility hypointensity compatible with hemosiderin deposition, probably related to chronic hypertension, less likely cerebral amyloid angiopathy. 4. Normal MRA of the head.  Treadmill stress test 08/26/2015: Comments: Indication: Tachycardia The patient exercised according to Bruce Protocol, Total time recorded 6:01 min achieving max heart rate of 164 which was 95 % of MPHR for age and 7.05 METS of work. Normal BP response. Resting ECG showing tachycardia. @ 105/min. There was no ST-T changes of ischemia with exercise stress test. No arrythmias noted during exercise. Stress terminated due to shortness of breath and THR >85% met.  Labs: 05/02/2019: Glucose 102, BUN/Cr 18/1.2. EGFR >60. Na/K 139/3.9. Albumin 3.2. Rest of the CMP normal H/H 12.7/36.6. MCV 83. Platelets 174 Chol 158, TG 72, HDL 76, LDL 68 TSH 2.4 normal    Review of Systems  Cardiovascular: Negative for chest pain, dyspnea on exertion, leg swelling, palpitations and syncope.  Neurological: Positive for dizziness.         Orthostatic vitals: Supine: 163/83 mmHg, HR 67/min Sitting: 173/88 mmHg, HR 70/min standing: 187/100 mmHg, HR 80/min   Vitals:   08/28/19 1003  BP: (!) 189/97  Pulse: 66  Temp: 97.8 F (36.6 C)  SpO2: 97%    Objective:  Physical Exam  Constitutional: He appears well-developed and well-nourished.  Neck: No JVD present.  Cardiovascular: Normal rate, regular rhythm and intact distal pulses.  Musculoskeletal:        General: No edema.  Nursing note and vitals reviewed.          Assessment & Recommendations:   86 year old African-American male with hypertension, hyperlipidemia, type 2 diabetes mellitus, h/o CVA, CKD 3, atrial tachycardia status post ablation, recent steroid use for Herpes Zoster.  Paroxysmal atrial tachycardia: Status post ablation by  Dr. Lovena Le on 05/08/2019. No recurrence.  Essential hypertension Remains uncontrolled. Orthostatics are negative. I suspect his dizziness is related to uncontrolled hypertension. Given discrepancy in his home and office BP readings, I have not made any change today. Encourage regular home monitoring. He knows to bring BP monitor and log to his next visit in 4 weeks.    Nigel Mormon, MD [facility] Pager: [phone] Office: [phone] If no answer Cell [phone]

## 2019-08-28 ENCOUNTER — Encounter: Payer: Self-pay | Admitting: Cardiology

## 2019-08-28 ENCOUNTER — Other Ambulatory Visit: Payer: Self-pay

## 2019-08-28 ENCOUNTER — Ambulatory Visit: Payer: Medicare Other | Admitting: Podiatry

## 2019-08-28 ENCOUNTER — Ambulatory Visit (INDEPENDENT_AMBULATORY_CARE_PROVIDER_SITE_OTHER): Payer: Medicare Other | Admitting: Cardiology

## 2019-08-28 ENCOUNTER — Encounter: Payer: Self-pay | Admitting: Podiatry

## 2019-08-28 VITALS — BP 187/100 | HR 80 | Temp 97.8°F | Ht 75.0 in | Wt 237.6 lb

## 2019-08-28 DIAGNOSIS — I10 Essential (primary) hypertension: Secondary | ICD-10-CM

## 2019-08-28 DIAGNOSIS — Z794 Long term (current) use of insulin: Secondary | ICD-10-CM | POA: Diagnosis not present

## 2019-08-28 DIAGNOSIS — E119 Type 2 diabetes mellitus without complications: Secondary | ICD-10-CM | POA: Diagnosis not present

## 2019-08-28 DIAGNOSIS — B351 Tinea unguium: Secondary | ICD-10-CM

## 2019-08-28 DIAGNOSIS — M79676 Pain in unspecified toe(s): Secondary | ICD-10-CM | POA: Diagnosis not present

## 2019-08-28 DIAGNOSIS — I471 Supraventricular tachycardia: Secondary | ICD-10-CM

## 2019-08-28 NOTE — Patient Instructions (Signed)
Diabetes Mellitus and Foot Care Foot care is an important part of your health, especially when you have diabetes. Diabetes may cause you to have problems because of poor blood flow (circulation) to your feet and legs, which can cause your skin to:  Become thinner and drier.  Break more easily.  Heal more slowly.  Peel and crack. You may also have nerve damage (neuropathy) in your legs and feet, causing decreased feeling in them. This means that you may not notice minor injuries to your feet that could lead to more serious problems. Noticing and addressing any potential problems early is the best way to prevent future foot problems. How to care for your feet Foot hygiene  Wash your feet daily with warm water and mild soap. Do not use hot water. Then, pat your feet and the areas between your toes until they are completely dry. Do not soak your feet as this can dry your skin.  Trim your toenails straight across. Do not dig under them or around the cuticle. File the edges of your nails with an emery board or nail file.  Apply a moisturizing lotion or petroleum jelly to the skin on your feet and to dry, brittle toenails. Use lotion that does not contain alcohol and is unscented. Do not apply lotion between your toes. Shoes and socks  Wear clean socks or stockings every day. Make sure they are not too tight. Do not wear knee-high stockings since they may decrease blood flow to your legs.  Wear shoes that fit properly and have enough cushioning. Always look in your shoes before you put them on to be sure there are no objects inside.  To break in new shoes, wear them for just a few hours a day. This prevents injuries on your feet. Wounds, scrapes, corns, and calluses  Check your feet daily for blisters, cuts, bruises, sores, and redness. If you cannot see the bottom of your feet, use a mirror or ask someone for help.  Do not cut corns or calluses or try to remove them with medicine.  If you  find a minor scrape, cut, or break in the skin on your feet, keep it and the skin around it clean and dry. You may clean these areas with mild soap and water. Do not clean the area with peroxide, alcohol, or iodine.  If you have a wound, scrape, corn, or callus on your foot, look at it several times a day to make sure it is healing and not infected. Check for: ? Redness, swelling, or pain. ? Fluid or blood. ? Warmth. ? Pus or a bad smell. General instructions  Do not cross your legs. This may decrease blood flow to your feet.  Do not use heating pads or hot water bottles on your feet. They may burn your skin. If you have lost feeling in your feet or legs, you may not know this is happening until it is too late.  Protect your feet from hot and cold by wearing shoes, such as at the beach or on hot pavement.  Schedule a complete foot exam at least once a year (annually) or more often if you have foot problems. If you have foot problems, report any cuts, sores, or bruises to your health care provider immediately. Contact a health care provider if:  You have a medical condition that increases your risk of infection and you have any cuts, sores, or bruises on your feet.  You have an injury that is not   healing.  You have redness on your legs or feet.  You feel burning or tingling in your legs or feet.  You have pain or cramps in your legs and feet.  Your legs or feet are numb.  Your feet always feel cold.  You have pain around a toenail. Get help right away if:  You have a wound, scrape, corn, or callus on your foot and: ? You have pain, swelling, or redness that gets worse. ? You have fluid or blood coming from the wound, scrape, corn, or callus. ? Your wound, scrape, corn, or callus feels warm to the touch. ? You have pus or a bad smell coming from the wound, scrape, corn, or callus. ? You have a fever. ? You have a red line going up your leg. Summary  Check your feet every day  for cuts, sores, red spots, swelling, and blisters.  Moisturize feet and legs daily.  Wear shoes that fit properly and have enough cushioning.  If you have foot problems, report any cuts, sores, or bruises to your health care provider immediately.  Schedule a complete foot exam at least once a year (annually) or more often if you have foot problems. This information is not intended to replace advice given to you by your health care provider. Make sure you discuss any questions you have with your health care provider. Document Revised: 04/26/2019 Document Reviewed: 09/04/2016 Elsevier Patient Education  2020 [employer]  

## 2019-08-29 ENCOUNTER — Telehealth: Payer: Self-pay | Admitting: Internal Medicine

## 2019-08-29 NOTE — Telephone Encounter (Signed)
Patient calling because he was admitted to the hospital back in September. While there he was given self-administered drugs. He is filling out a prescription reimbursement form because his insurance is not paying for these medications. He would like a nurse to give him a call because he has some questions in regards to the medications and the reimbursement form.

## 2019-08-29 NOTE — Telephone Encounter (Signed)
Returned call to Pt.  Pt wanting to know what to put as dispensing pharmacy/phone number for documentation (attempting to get outpatient oral medications covered from hospitalization 04/2019).    Advised to put [facility] hospital and [facility] hospital phone number.    No further action needed.

## 2019-08-31 NOTE — Progress Notes (Signed)
Subjective: Marcus Johnston presents today for preventative diabetic foot care. Patient is seen for follow up of painful, mycotic toenails which interfere with comfortable ambulation when wearing enclosed shoe gear. Pain is relieved with periodic professional debridement.  Glendale Chard, MD is patient's PCP. Last visit was: 07/06/2019.  Medications reviewed in chart.  Allergies  Allergen Reactions  . Statins Other (See Comments)    pain   Objective: There were no vitals filed for this visit.  Vascular Examination: Capillary refill time immediate x 10 digits.  Dorsalis pedis present b/l.  Posterior tibial pulses present b/l.  Digital hair  present x 10 digits.  Skin temperature gradient WNL b/l.   Dermatological Examination: Skin with normal turgor, texture and tone b/l.  Toenails 1-5 b/l discolored, thick, dystrophic with subungual debris and pain with palpation to nailbeds due to thickness of nails.  Musculoskeletal: Muscle strength 5/5 b/l to all LE muscle groups.  Gross bony deformities:  None.  No pain, crepitus or joint limitation with passive/active ROM b/l.  Neurological Examination: Protective sensation intact 5/5 b/l with 10 gram monofilament.  Vibratory sensation intact bilaterally.   Assessment: 1. Painful onychomycosis toenails 1-5 b/l 2. NIDDM  Plan: 1. Continue diabetic foot care principles. Literature dispensed on today. 2. Toenails 1-5 b/l were debrided in length and girth without iatrogenic bleeding. 3. Patient to continue soft, supportive shoe gear. 4. Patient to report any pedal injuries to medical professional. 5. Follow up 3 months.  6. Patient/POA to call should there be a concern in the interim.

## 2019-09-04 NOTE — Progress Notes (Signed)
[facility] Note  09/06/2019     CHIEF COMPLAINT Patient presents for Retina Follow Up   HISTORY OF PRESENT ILLNESS: Marcus Johnston is a 86 y.o. male who presents to the clinic today for:   HPI    Retina Follow Up    Patient presents with  Diabetic Retinopathy.  In both eyes.  This started months ago.  Severity is moderate.  Duration of 6 weeks.  Since onset it is gradually improving.  I, the attending physician,  performed the HPI with the patient and updated documentation appropriately.          Comments    85 y/o male pt here for 6 wk f/u for mod NPDR w/mac edema OU.  No change in [place] noticed OU.  Denies pain, flashes, floaters.  Pred TID OU.  Ran out of Ketorolac.  BS 137 this a.m.  A1C 7.2 6 wks ago.       Last edited by [name], MD on 09/08/2019 12:34 AM. (History)    pt states he has not noticed any change in vision since his last visit  Referring physician: Glendale Chard, MD [address] [place],  [place] [address]  HISTORICAL INFORMATION:   Selected notes from the [facility] Referred by Dr. [name] for CME OU LEE: 12.01.20 (S. Groat) [BCVA: OD: 20/60-- OS: 20/150+] Ocular Hx-glc suspect (former pt of Dr. Sammie Bench / Dr. Venetia Maxon) PMH-A fib, DM (A1c: 7.9 [09.13.20] takes metformin and humalog), prostate cancer, HTN, HLD   CURRENT MEDICATIONS: Current Outpatient Medications (Ophthalmic Drugs)  Medication Sig  . ketorolac (ACULAR) 0.5 % ophthalmic solution Place 1 drop into both eyes 4 (four) times daily.   . prednisoLONE acetate (PRED FORTE) 1 % ophthalmic suspension Place 1 drop into both eyes 3 (three) times daily.   No current facility-administered medications for this visit. (Ophthalmic Drugs)   Current Outpatient Medications (Other)  Medication Sig  . albuterol (PROVENTIL HFA;VENTOLIN HFA) 108 (90 Base) MCG/ACT inhaler Inhale 2 puffs into the lungs every 4 (four) hours as needed for wheezing or  shortness of breath (cough, shortness of breath or wheezing.).  Marland Kitchen carvedilol (COREG) 12.5 MG tablet Take 1 tablet (12.5 mg total) by mouth 2 (two) times daily with a meal. (Patient taking differently: Take by mouth. 6.25 in the am, 12.5 at bedtime)  . Cholecalciferol (VITAMIN D3) 50 MCG (2000 UT) TABS Take 1,000 Units by mouth daily.   Marland Kitchen doxazosin (CARDURA) 4 MG tablet TAKE 1 TABLET BY MOUTH AT  BEDTIME  . EASY TOUCH INSULIN SYRINGE 30G X 5/16" 0.5 ML MISC USE 3 TIMES DAILY  . fexofenadine (ALLEGRA) 180 MG tablet Take 180 mg by mouth daily as needed (allergies.).   Marland Kitchen gabapentin (NEURONTIN) 300 MG capsule Take 1 capsule (300 mg total) by mouth at bedtime.  Marland Kitchen HUMALOG MIX 50/50 (50-50) 100 UNIT/ML SUSP injection Inject 22-25 Units into the skin 2 (two) times daily. 25 units in the morning and 22 units in the afternoon  . losartan (COZAAR) 100 MG tablet Take 100 mg by mouth daily.  Marland Kitchen losartan-hydrochlorothiazide (HYZAAR) 100-25 MG tablet TAKE 1 TABLET BY MOUTH  DAILY (Patient not taking: No sig reported)  . metFORMIN (GLUCOPHAGE-XR) 500 MG 24 hr tablet Take 1,000 mg by mouth 2 (two) times daily.    No current facility-administered medications for this visit. (Other)      REVIEW OF SYSTEMS: ROS    Positive for: Neurological, Genitourinary,  Endocrine, Cardiovascular, Eyes   Negative for: Constitutional, Gastrointestinal, Skin, Musculoskeletal, HENT, Respiratory, Psychiatric, Allergic/Imm, Heme/Lymph   Last edited by [name], COA on 09/06/2019  3:00 PM. (History)       ALLERGIES Allergies  Allergen Reactions  . Statins Other (See Comments)    pain    PAST MEDICAL HISTORY Past Medical History:  Diagnosis Date  . Atrial fibrillation ([place])   . Atypical atrial flutter ([place]) 10/17/2018  . Diabetes mellitus without complication ([place])   . Diabetic retinopathy ([place])    NPDR OU  . High cholesterol   . History of prostate cancer   . HTN (hypertension)   . Hypertensive retinopathy     OU  . Renal insufficiency   . Shingles    Past Surgical History:  Procedure Laterality Date  . ATRIAL TACH ABLATION N/A 05/08/2019   Procedure: ATRIAL TACH ABLATION;  Surgeon: [name], MD;  Location: [facility];  Service: Cardiovascular;  Laterality: N/A;  . CATARACT EXTRACTION Bilateral   . EYE SURGERY Bilateral    Cat Sx  . prostate implant      FAMILY HISTORY Family History  Problem Relation Age of Onset  . Cancer Mother   . Cancer Father     SOCIAL HISTORY Social History   Tobacco Use  . Smoking status: Never Smoker  . Smokeless tobacco: Never Used  Substance Use Topics  . Alcohol use: No  . Drug use: No         OPHTHALMIC EXAM:  Base Eye Exam    Visual Acuity (Snellen - Linear)      Right Left   Dist cc 20/30 -2 20/100 -2   Dist ph cc NI NI   Correction: Glasses       Tonometry (Tonopen, 3:03 PM)      Right Left   Pressure 14 13       Pupils      Dark Light Shape React APD   Right 3 2 Round Brisk None   Left 3 2 Round Brisk None       Visual Fields (Counting fingers)      Left Right    Full Full       Extraocular Movement      Right Left    Full, Ortho Full, Ortho       Neuro/Psych    Oriented x3: Yes   Mood/Affect: Normal       Dilation    Both eyes: 1.0% Mydriacyl, 2.5% Phenylephrine @ 3:03 PM        Slit Lamp and Fundus Exam    Slit Lamp Exam      Right Left   Lids/Lashes Dermatochalasis - upper lid, mild Meibomian gland dysfunction Dermatochalasis - upper lid, mild Meibomian gland dysfunction   Conjunctiva/Sclera Mild Melanosis Mild Melanosis   Cornea Trace Punctate epithelial erosions 1+ Punctate epithelial erosions. endopigment   Anterior Chamber Deep and quiet Deep and quiet   Iris Round and dilated, No NVI Round and dilated, No NVI   Lens Toric PC IOL with marks at 1200 and 0600 PC IOL in good position with PC folds   Vitreous Vitreous syneresis Vitreous syneresis       Fundus Exam      Right Left    Disc Mild Pallor, mild, temporal Peripapillary atrophy, Sharp rim temporal Peripapillary atrophy, +cupping, mild Pallor, Sharp rim   C/D Ratio 0.6 0.65   Macula Blunted foveal reflex, central IRH -- improving, pigment  clumping, Microaneurysms, +central cystic changes Blunted foveal reflex, Retinal pigment epithelial mottling and clumping, focal Exudates, +IRH, +central Cystic changes -- slightly improved, scattered Microaneurysms   Vessels Vascular attenuation, mild Copper wiring, AV crossing changes Vascular attenuation, mild Copper wiring, AV crossing changes   Periphery Attached, large HST at 0500 with surrounding SRF, +pigmented barrier laser scar from 0430-0600    Attached, small pigmented operculum at 0730 midzone             IMAGING AND PROCEDURES  Imaging and Procedures for _0 @  OCT, Retina - OU - Both Eyes       Right Eye Quality was good. Central Foveal Thickness: 486. Progression has been stable. Findings include abnormal foveal contour, intraretinal fluid, no SRF (Persistent IRF, Trace ERM).   Left Eye Quality was good. Central Foveal Thickness: 367. Progression has improved. Findings include intraretinal fluid, abnormal foveal contour, intraretinal hyper-reflective material, no SRF (Mild ERM; interval improvement in IRF).   Notes *Images captured and stored on drive  Diagnosis / Impression:  OD: abnormal foveal profile w/ +IRF -- macular edema (DME +/- CME component) -- Persistent IRF, Trace ERM OS: abnoraml foveal profile w/ +IRF/IRHM -- DME; interval improvement in IRF  Clinical management:  See below  Abbreviations: NFP - Normal foveal profile. CME - cystoid macular edema. PED - pigment epithelial detachment. IRF - intraretinal fluid. SRF - subretinal fluid. EZ - ellipsoid zone. ERM - epiretinal membrane. ORA - outer retinal atrophy. ORT - outer retinal tubulation. SRHM - subretinal hyper-reflective material        Intravitreal Injection, Pharmacologic Agent  - OD - Right Eye       Time Out 09/06/2019. 4:22 PM. Confirmed correct patient, procedure, site, and patient consented.   Anesthesia Topical anesthesia was used. Anesthetic medications included Lidocaine 2%, Proparacaine 0.5%.   Procedure Preparation included 5% betadine to ocular surface, eyelid speculum. A supplied (32 g) needle was used.   Injection:  2 mg aflibercept Alfonse Flavors) SOLN   NDC: A3590391, Lot: [id number], Expiration date: 02/14/2020   Route: Intravitreal, Site: Right Eye, Waste: 0.05 mL  Post-op Post injection exam found visual acuity of at least counting fingers. The patient tolerated the procedure well. There were no complications. The patient received written and verbal post procedure care education.        Intravitreal Injection, Pharmacologic Agent - OS - Left Eye       Time Out 09/06/2019. 4:23 PM. Confirmed correct patient, procedure, site, and patient consented.   Anesthesia Topical anesthesia was used. Anesthetic medications included Lidocaine 2%, Proparacaine 0.5%.   Procedure Preparation included 5% betadine to ocular surface, eyelid speculum. A supplied (32 g) needle was used.   Injection:  2 mg aflibercept Alfonse Flavors) SOLN   NDC: A3590391, Lot: [id number], Expiration date: 02/14/2020   Route: Intravitreal, Site: Left Eye, Waste: 0.05 mL  Post-op Post injection exam found visual acuity of at least counting fingers. The patient tolerated the procedure well. There were no complications. The patient received written and verbal post procedure care education.                 ASSESSMENT/PLAN:    ICD-10-CM   1. Moderate nonproliferative diabetic retinopathy of both eyes with macular edema associated with type 2 diabetes mellitus (HCC)  B76.2831 Intravitreal Injection, Pharmacologic Agent - OD - Right Eye    Intravitreal Injection, Pharmacologic Agent - OS - Left Eye    aflibercept (EYLEA) SOLN 2 mg    aflibercept (EYLEA) SOLN 2  mg  2. Retinal  edema  H35.81 OCT, Retina - OU - Both Eyes  3. History of retinal detachment  Z86.69   4. Essential hypertension  I10   5. Hypertensive retinopathy of both eyes  H35.033   6. Pseudophakia of both eyes  Z96.1     1,2.  Non-proliferative diabetic retinopathy w/ macular edema OU  - patient with long standing history of macular edema -- was seen by Dr. Zigmund Daniel back in 2013  - has also seen Dr. Baird Cancer, Dr. Posey Pronto, [place] [facility] at [facility]  - last retina f/u in 2019 with Dr. Posey Pronto and at [facility]  - pt has previously received intravitreal injections OU, but does not like them and felt like benefits from injections were minmal  - currently using PF and ketorolac QID OU  - exam shows scattered/minimal MA but central edema OU  - FA (12.11.20) shows late leaking MA, OS shows capillary nonperfusion, OD with ?petaloid central hyperfluorescence -- ?CME component  - s/p IVA# 1 OD (12.11.20)  - s/p IVE# 1 OS (12.11.20)  - BCVA OD: 20/30-2; OS: 20/100-2  - OCT shows persistent IRF OD; mild interval improvement in IRF OS (noticably better response w/ IVE vs IVA)  - Eylea4U benefits investigation initiated 12.11.20 -- approved for 2021  - recommend IVE #1 OD and IVE OS #2 today, 01.20.21  - pt wishes to proceed  - RBA of procedure discussed, questions answered  - informed consent obtained and signed  - see procedure note  - f/u in 4 wks -- DFE/OCT/possible injection  3. History of inferior HST w/ focal RD OD s/p laser retinopexy  - HST at 0500 w/ surrounding SRF  - good barricade laser in place spanning 0430-0600  - stable  4,5. Hypertensive retinopathy OU  - discussed importance of tight BP control  - monitor  6. Pseudophakia OU  - s/p CE/IOL OU  - beautiful surgeries, doing well  - monitor   Ophthalmic Meds Ordered this visit:  Meds ordered this encounter  Medications  . aflibercept (EYLEA) SOLN 2 mg  . aflibercept (EYLEA) SOLN 2 mg       Return in about 4 weeks (around  10/04/2019) for f/u NPDR OU, DFE, OCT.  There are no Patient Instructions on file for this visit.   Explained the diagnoses, plan, and follow up with the patient and they expressed understanding.  Patient expressed understanding of the importance of proper follow up care.   This document serves as a record of services personally performed by Gardiner Sleeper, MD, PhD. It was created on their behalf by Roselee Nova, COMT. The creation of this record is the provider's dictation and/or activities during the visit.  Electronically signed by: Roselee Nova, COMT 09/08/19 12:48 AM   This document serves as a record of services personally performed by Gardiner Sleeper, MD, PhD. It was created on their behalf by Ernest Mallick, OA, an ophthalmic assistant. The creation of this record is the provider's dictation and/or activities during the visit.    Electronically signed by: Ernest Mallick, OA 01.20.2021 12:48 AM   Gardiner Sleeper, M.D., Ph.D. Diseases & Surgery of the Retina and [place] 09/06/2019   I have reviewed the above documentation for accuracy and completeness, and I agree with the above. Gardiner Sleeper, M.D., Ph.D. 09/08/19 12:48 AM   Abbreviations: M myopia (nearsighted); A astigmatism; H hyperopia (farsighted); P presbyopia; Mrx spectacle prescription;  CTL contact lenses;  OD right eye; OS left eye; OU both eyes  XT exotropia; ET esotropia; PEK punctate epithelial keratitis; PEE punctate epithelial erosions; DES dry eye syndrome; MGD meibomian gland dysfunction; ATs artificial tears; PFAT's preservative free artificial tears; [place] nuclear sclerotic cataract; PSC posterior subcapsular cataract; ERM epi-retinal membrane; PVD posterior vitreous detachment; RD retinal detachment; DM diabetes mellitus; DR diabetic retinopathy; NPDR non-proliferative diabetic retinopathy; PDR proliferative diabetic retinopathy; CSME clinically significant macular edema; DME diabetic  macular edema; dbh dot blot hemorrhages; CWS cotton wool spot; POAG primary open angle glaucoma; C/D cup-to-disc ratio; HVF humphrey visual field; GVF goldmann visual field; OCT optical coherence tomography; IOP intraocular pressure; BRVO Branch retinal vein occlusion; CRVO central retinal vein occlusion; CRAO central retinal artery occlusion; BRAO branch retinal artery occlusion; RT retinal tear; SB scleral buckle; PPV pars plana vitrectomy; VH Vitreous hemorrhage; PRP panretinal laser photocoagulation; IVK intravitreal kenalog; VMT vitreomacular traction; MH Macular hole;  NVD neovascularization of the disc; NVE neovascularization elsewhere; AREDS age related eye disease study; ARMD age related macular degeneration; POAG primary open angle glaucoma; EBMD epithelial/anterior basement membrane dystrophy; ACIOL anterior chamber intraocular lens; IOL intraocular lens; PCIOL posterior chamber intraocular lens; Phaco/IOL phacoemulsification with intraocular lens placement; [place] photorefractive keratectomy; LASIK laser assisted in situ keratomileusis; HTN hypertension; DM diabetes mellitus; COPD chronic obstructive pulmonary disease

## 2019-09-06 ENCOUNTER — Encounter (INDEPENDENT_AMBULATORY_CARE_PROVIDER_SITE_OTHER): Payer: Self-pay | Admitting: Ophthalmology

## 2019-09-06 ENCOUNTER — Other Ambulatory Visit: Payer: Self-pay

## 2019-09-06 ENCOUNTER — Ambulatory Visit (INDEPENDENT_AMBULATORY_CARE_PROVIDER_SITE_OTHER): Payer: Medicare Other | Admitting: Ophthalmology

## 2019-09-06 DIAGNOSIS — H3581 Retinal edema: Secondary | ICD-10-CM | POA: Diagnosis not present

## 2019-09-06 DIAGNOSIS — E113313 Type 2 diabetes mellitus with moderate nonproliferative diabetic retinopathy with macular edema, bilateral: Secondary | ICD-10-CM

## 2019-09-06 DIAGNOSIS — H35033 Hypertensive retinopathy, bilateral: Secondary | ICD-10-CM

## 2019-09-06 DIAGNOSIS — Z8669 Personal history of other diseases of the nervous system and sense organs: Secondary | ICD-10-CM | POA: Diagnosis not present

## 2019-09-06 DIAGNOSIS — I10 Essential (primary) hypertension: Secondary | ICD-10-CM | POA: Diagnosis not present

## 2019-09-06 DIAGNOSIS — Z961 Presence of intraocular lens: Secondary | ICD-10-CM

## 2019-09-08 MED ORDER — AFLIBERCEPT 2MG/0.05ML IZ SOLN FOR KALEIDOSCOPE
2.0000 mg | INTRAVITREAL | Status: AC | PRN
  Administered 2019-09-08: 2 mg via INTRAVITREAL

## 2019-09-18 ENCOUNTER — Other Ambulatory Visit: Payer: Self-pay

## 2019-09-18 DIAGNOSIS — I10 Essential (primary) hypertension: Secondary | ICD-10-CM

## 2019-09-18 MED ORDER — CARVEDILOL 12.5 MG PO TABS: 12.5000 mg | ORAL_TABLET | Freq: Two times a day (BID) | ORAL | 2 refills | Status: DC

## 2019-09-20 ENCOUNTER — Other Ambulatory Visit: Payer: Self-pay

## 2019-09-20 MED ORDER — "INSULIN SYRINGE-NEEDLE U-100 30G X 5/16"" 0.5 ML MISC": 3 refills | Status: DC

## 2019-09-24 NOTE — Progress Notes (Signed)
Subjective:   Marcus Johnston, male    DOB: 1931-01-02, 86 y.o.   MRN: [id number]   Chief Complaint  Patient presents with  . Hypertension  . Follow-up    4 week   HPI  86 year old African-American male with hypertension, hyperlipidemia, type 2 diabetes mellitus, h/o CVA, CKD 3, atrial tachycardia status post ablation, recent steroid use for Herpes Zoster.  BP continues to be higher office visits. BP much lowe on home monitor, checked at home. Home monitor readings compared to BP readings in the office today.   Current Outpatient Medications on File Prior to Visit  Medication Sig Dispense Refill  . albuterol (PROVENTIL HFA;VENTOLIN HFA) 108 (90 Base) MCG/ACT inhaler Inhale 2 puffs into the lungs every 4 (four) hours as needed for wheezing or shortness of breath (cough, shortness of breath or wheezing.). 1 Inhaler 1  . carvedilol (COREG) 12.5 MG tablet Take 1 tablet (12.5 mg total) by mouth 2 (two) times daily with a meal. 60 tablet 2  . Cholecalciferol (VITAMIN D3) 50 MCG (2000 UT) TABS Take 1,000 Units by mouth daily.     Marland Kitchen doxazosin (CARDURA) 4 MG tablet TAKE 1 TABLET BY MOUTH AT  BEDTIME 90 tablet 3  . fexofenadine (ALLEGRA) 180 MG tablet Take 180 mg by mouth daily as needed (allergies.).     Marland Kitchen HUMALOG MIX 50/50 (50-50) 100 UNIT/ML SUSP injection Inject 22-25 Units into the skin 2 (two) times daily. 25 units in the morning and 22 units in the afternoon    . Insulin Syringe-Needle U-100 (EASY TOUCH INSULIN SYRINGE) 30G X 5/16" 0.5 ML MISC USE 3 TIMES DAILY 270 each 3  . ketorolac (ACULAR) 0.5 % ophthalmic solution Place 1 drop into both eyes 4 (four) times daily.     Marland Kitchen losartan-hydrochlorothiazide (HYZAAR) 100-25 MG tablet TAKE 1 TABLET BY MOUTH  DAILY 90 tablet 3  . metFORMIN (GLUCOPHAGE-XR) 500 MG 24 hr tablet Take 1,000 mg by mouth 2 (two) times daily.     . prednisoLONE acetate (PRED FORTE) 1 % ophthalmic suspension Place 1 drop into both eyes 3 (three) times daily.     No  current facility-administered medications on file prior to visit.    Cardiovascular studies:  Lower Extremity Arterial Duplex 05/26/2019: Moderate velocity increase at the right mid anterior tibial artery suggests >50% stenosis.  No hemodynamically significant stenosis is identified in the left lower extremity arterial system. Non-compressible ABI on the right, suggestive of medial calcinosis. Non-compressible ABI on the left, suggestive of medial calcinosis.  No pseudoaneurysm noted in bilateral goins/femoral arteries.  EKG 05/17/2019: Sinus rhythm 67 bpm. Right atrial enlargement.  Poor R-wave progression.  Nonspecific T-abnormality.   Atrial tachycardia ablation 05/08/2019, Dr. Crissie Sickles: Conclusion: Successful EP study and catheter ablation of an atrial tachycardia originating in the para-hisian region of the right atrium but being successfully ablated in the noncoronary cusp of the aortic root.  Echocardiogram 01/20/2019: Normal LV systolic function with EF 59%. Left ventricle cavity is normal in size. Moderate concentric hypertrophy of the left ventricle. Normal global wall motion. Doppler evidence of grade I (impaired) diastolic dysfunction, normal LAP. Calculated EF 59%. Moderate (Grade II) mitral regurgitation. Moderate tricuspid regurgitation. Estimated pulmonary artery systolic pressure 40 mmHg.   EKG 11/07/2018: Sinus rhythm 59 bpm RIght atrial enlargement Left axis deviation.  Baseline artifact.   EKG 10/17/2018: Atypical atrial flutter with RVR 160 bpm. Nonspecific ST-T abnormality  Vascular US Carotid Bilateral 11/14/2016: intimal wall thickness CCA. 1-39% ICA  plaquing. Vertebral artery flow is antegrade.  MRI Brain 11/12/2016: 1. No acute intracranial abnormality identified. 2. Moderate chronic microvascular ischemic changes and moderate parenchymal volume loss of the brain. 3. Scattered punctate foci of susceptibility hypointensity compatible with  hemosiderin deposition, probably related to chronic hypertension, less likely cerebral amyloid angiopathy. 4. Normal MRA of the head.  Treadmill stress test 08/26/2015: Comments: Indication: Tachycardia The patient exercised according to Bruce Protocol, Total time recorded 6:01 min achieving max heart rate of 164 which was 95 % of MPHR for age and 7.05 METS of work. Normal BP response. Resting ECG showing tachycardia. @ 105/min. There was no ST-T changes of ischemia with exercise stress test. No arrythmias noted during exercise. Stress terminated due to shortness of breath and THR >85% met.  Labs: 05/02/2019: Glucose 102, BUN/Cr 18/1.2. EGFR >60. Na/K 139/3.9. Albumin 3.2. Rest of the CMP normal H/H 12.7/36.6. MCV 83. Platelets 174 Chol 158, TG 72, HDL 76, LDL 68 TSH 2.4 normal    Review of Systems  Cardiovascular: Negative for chest pain, dyspnea on exertion, leg swelling, palpitations and syncope.  Neurological: Positive for dizziness.   Vitals:   09/25/19 1153 09/25/19 1254 09/25/19 1255 09/25/19 1256  BP: (!) 202/106 (!) 68/54 (!) 93/59 (!) 206/100  Pulse: 69 70 78   Temp:      SpO2: 98%     Weight:      Height:         Objective:  Physical Exam  Constitutional: He appears well-developed and well-nourished.  Neck: No JVD present.  Cardiovascular: Normal rate, regular rhythm and intact distal pulses.  Musculoskeletal:        General: No edema.  Nursing note and vitals reviewed.       Assessment & Recommendations:   86 year old African-American male with hypertension, hyperlipidemia, type 2 diabetes mellitus, h/o CVA, CKD 3, atrial tachycardia status post ablation, recent steroid use for Herpes Zoster.  Paroxysmal atrial tachycardia: Status post ablation by Dr. Lovena Le on 05/08/2019. No recurrence.  Essential hypertension/[place] hypertension: Home monitor calibrated with BP readings in the office. Home readings much lower than readings here. No change made to his  antihypertensive therapy today.  Continue coreg 12.5 mg bid, losartan-HCTZ 100-25 mg daily.  Since his BP was 200/100 mmHg in the office, I gave him one dose of clinic administered Bidil 20-37.5 mg. 15 min later, when my assistant went to recheck his blood pressure, he appeared confused, unable tell the year correctly, still oriented to self and place. He got further diaphoretic, cold and clammy. Pulse became thready, but never lost. BP was 68/54 mmHg. He did stop breathing for a few seconds. As I was about to insert an airway and start bag ventilation, he awoke with straight leg raise. BP increased to 200/100 mmHg again, down to 140/100 mmHg 5 min later.   EMS were called to take him to ER for further evaluation. I suspect his symptoms were due to exaggerated blood pressure drop with Bidil 20-37/5 mg 1 tab, leading to global hypoperfusion and possible vasovagal episode.  I added Bidil to allergy/contraindication list.   Recommend ED evaluation and monitoring +/- IV fluids.   Tried to contact spouse Kyrillos Adams on [phone]. No answer.    Nigel Mormon, MD [facility] Pager: [phone] Office: [phone] If no answer Cell [phone]

## 2019-09-25 ENCOUNTER — Emergency Department (HOSPITAL_COMMUNITY)
Admission: EM | Admit: 2019-09-25 | Discharge: 2019-09-25 | Disposition: A | Discharge status: Home / Self Care | Payer: Medicare Other | Attending: Emergency Medicine | Admitting: Emergency Medicine

## 2019-09-25 ENCOUNTER — Ambulatory Visit: Payer: Medicare Other | Admitting: Cardiology

## 2019-09-25 ENCOUNTER — Ambulatory Visit: Payer: Medicare Other | Admitting: Internal Medicine

## 2019-09-25 ENCOUNTER — Emergency Department (HOSPITAL_COMMUNITY): Payer: Medicare Other

## 2019-09-25 ENCOUNTER — Encounter (HOSPITAL_COMMUNITY): Payer: Self-pay

## 2019-09-25 ENCOUNTER — Encounter: Payer: Self-pay | Admitting: Cardiology

## 2019-09-25 ENCOUNTER — Other Ambulatory Visit: Payer: Self-pay

## 2019-09-25 VITALS — BP 206/100 | HR 78 | Temp 96.7°F | Ht 75.0 in | Wt 240.0 lb

## 2019-09-25 DIAGNOSIS — I10 Essential (primary) hypertension: Secondary | ICD-10-CM

## 2019-09-25 DIAGNOSIS — Z794 Long term (current) use of insulin: Secondary | ICD-10-CM | POA: Diagnosis not present

## 2019-09-25 DIAGNOSIS — R001 Bradycardia, unspecified: Secondary | ICD-10-CM | POA: Diagnosis not present

## 2019-09-25 DIAGNOSIS — I471 Supraventricular tachycardia: Secondary | ICD-10-CM | POA: Diagnosis not present

## 2019-09-25 DIAGNOSIS — R55 Syncope and collapse: Secondary | ICD-10-CM | POA: Diagnosis not present

## 2019-09-25 DIAGNOSIS — E119 Type 2 diabetes mellitus without complications: Secondary | ICD-10-CM | POA: Insufficient documentation

## 2019-09-25 DIAGNOSIS — H53462 Homonymous bilateral field defects, left side: Secondary | ICD-10-CM | POA: Insufficient documentation

## 2019-09-25 DIAGNOSIS — I952 Hypotension due to drugs: Secondary | ICD-10-CM

## 2019-09-25 DIAGNOSIS — Z743 Need for continuous supervision: Secondary | ICD-10-CM | POA: Diagnosis not present

## 2019-09-25 DIAGNOSIS — R42 Dizziness and giddiness: Secondary | ICD-10-CM | POA: Diagnosis not present

## 2019-09-25 DIAGNOSIS — Z79899 Other long term (current) drug therapy: Secondary | ICD-10-CM | POA: Insufficient documentation

## 2019-09-25 DIAGNOSIS — R29818 Other symptoms and signs involving the nervous system: Secondary | ICD-10-CM | POA: Diagnosis not present

## 2019-09-25 LAB — CBC
HCT: 39.7 % (ref 39.0–52.0)
Hemoglobin: 12.7 g/dL — ABNORMAL LOW (ref 13.0–17.0)
MCH: 27.4 pg (ref 26.0–34.0)
MCHC: 32 g/dL (ref 30.0–36.0)
MCV: 85.6 fL (ref 80.0–100.0)
Platelets: 172 10*3/uL (ref 150–400)
RBC: 4.64 MIL/uL (ref 4.22–5.81)
RDW: 14.8 % (ref 11.5–15.5)
WBC: 5.7 10*3/uL (ref 4.0–10.5)
nRBC: 0 % (ref 0.0–0.2)

## 2019-09-25 LAB — URINALYSIS, ROUTINE W REFLEX MICROSCOPIC
Bilirubin Urine: NEGATIVE
Glucose, UA: 50 mg/dL — AB
Hgb urine dipstick: NEGATIVE
Ketones, ur: 5 mg/dL — AB
Leukocytes,Ua: NEGATIVE
Nitrite: NEGATIVE
Protein, ur: 100 mg/dL — AB
Specific Gravity, Urine: 1.017 (ref 1.005–1.030)
pH: 8 (ref 5.0–8.0)

## 2019-09-25 LAB — APTT: aPTT: 30 seconds (ref 24–36)

## 2019-09-25 LAB — TROPONIN I (HIGH SENSITIVITY): Troponin I (High Sensitivity): 4 ng/L (ref <18)

## 2019-09-25 LAB — RAPID URINE DRUG SCREEN, HOSP PERFORMED
Amphetamines: NOT DETECTED
Barbiturates: NOT DETECTED
Benzodiazepines: NOT DETECTED
Cocaine: NOT DETECTED
Opiates: NOT DETECTED
Tetrahydrocannabinol: NOT DETECTED

## 2019-09-25 LAB — DIFFERENTIAL
Abs Immature Granulocytes: 0.01 10*3/uL (ref 0.00–0.07)
Basophils Absolute: 0 10*3/uL (ref 0.0–0.1)
Basophils Relative: 0 %
Eosinophils Absolute: 0.1 10*3/uL (ref 0.0–0.5)
Eosinophils Relative: 2 %
Immature Granulocytes: 0 %
Lymphocytes Relative: 28 %
Lymphs Abs: 1.6 10*3/uL (ref 0.7–4.0)
Monocytes Absolute: 0.6 10*3/uL (ref 0.1–1.0)
Monocytes Relative: 11 %
Neutro Abs: 3.4 10*3/uL (ref 1.7–7.7)
Neutrophils Relative %: 59 %

## 2019-09-25 LAB — COMPREHENSIVE METABOLIC PANEL
ALT: 11 U/L (ref 0–44)
AST: 17 U/L (ref 15–41)
Albumin: 3.1 g/dL — ABNORMAL LOW (ref 3.5–5.0)
Alkaline Phosphatase: 58 U/L (ref 38–126)
Anion gap: 12 (ref 5–15)
BUN: 17 mg/dL (ref 8–23)
CO2: 24 mmol/L (ref 22–32)
Calcium: 9 mg/dL (ref 8.9–10.3)
Chloride: 106 mmol/L (ref 98–111)
Creatinine, Ser: 1.24 mg/dL (ref 0.61–1.24)
GFR calc Af Amer: >60 mL/min (ref >60)
GFR calc non Af Amer: 52 mL/min — ABNORMAL LOW (ref >60)
Glucose, Bld: 131 mg/dL — ABNORMAL HIGH (ref 70–99)
Potassium: 3.7 mmol/L (ref 3.5–5.1)
Sodium: 142 mmol/L (ref 135–145)
Total Bilirubin: 0.5 mg/dL (ref 0.3–1.2)
Total Protein: 5.6 g/dL — ABNORMAL LOW (ref 6.5–8.1)

## 2019-09-25 LAB — ETHANOL: Alcohol, Ethyl (B): <10 mg/dL (ref <10)

## 2019-09-25 LAB — CBG MONITORING, ED: Glucose-Capillary: 137 mg/dL — ABNORMAL HIGH (ref 70–99)

## 2019-09-25 LAB — PROTIME-INR
INR: 1.1 (ref 0.8–1.2)
Prothrombin Time: 14.4 seconds (ref 11.4–15.2)

## 2019-09-25 NOTE — ED Notes (Signed)
Pt still in MRI 

## 2019-09-25 NOTE — ED Notes (Signed)
Pt transported to MRI 

## 2019-09-25 NOTE — ED Provider Notes (Signed)
[facility] EMERGENCY DEPARTMENT Provider Note   CSN: [id number] Arrival date & time: 09/25/19  1330     History Chief Complaint  Patient presents with  . Loss of Consciousness    Marcus Johnston is a 86 y.o. male.  86 yo gentleman with PMH of atrial fibrillation s/p ablation 48mo ago, T2DM, hypercholesteremia, HTN who presents from his cardiology office where he was being seen for a follow up appt. At that time, he received a dose of bidil for a SBP in the 200s. Approx 15 min later,he became confused, diaphoretic, and had a few seconds of respiratory arrest. Blood pressure at that time was in the 0000000 systolically. Per cardiology's notes, appears the patient was almost intubated in the clinic. This was avoided, however, as the patient became more aware and was protecting his airway. EMS notes that the patient was initially sitting and talking normally when they arrived however became progressively confused again and perhaps had some expressive aphasia during transport to the hospital. Patient was unable to significantly contribute to history due to AMS. Attempted to reach spouse without success. LEVEL 5 CAVIAT.        Past Medical History:  Diagnosis Date  . Atrial fibrillation ([place])   . Atypical atrial flutter ([place]) 10/17/2018  . Diabetes mellitus without complication ([place])   . Diabetic retinopathy ([place])    NPDR OU  . High cholesterol   . History of prostate cancer   . HTN (hypertension)   . Hypertensive retinopathy    OU  . Renal insufficiency   . Shingles     Patient Active Problem List   Diagnosis Date Noted  . Groin swelling 05/17/2019  . Bruit (arterial) 05/17/2019  . SVT (supraventricular tachycardia) ([place]) 05/08/2019  . Atrial tachycardia ([place]) 05/02/2019  . Bradycardia 04/30/2019  . Syncope 04/30/2019  . Sick sinus syndrome ([place]) 04/30/2019  . Syncope and collapse 04/29/2019  . Leg edema 10/17/2018  . Type 2 diabetes mellitus with other  circulatory complications ([place]) 0000000  . Hyperglycemia   . history of TIA (transient ischemic attack) 11/12/2016  . White coat syndrome with hypertension 11/12/2016  . DM (diabetes mellitus) ([place]) 11/12/2016  . Renal insufficiency 11/12/2016    Past Surgical History:  Procedure Laterality Date  . ATRIAL TACH ABLATION N/A 05/08/2019   Procedure: ATRIAL TACH ABLATION;  Surgeon: [name], MD;  Location: [facility];  Service: Cardiovascular;  Laterality: N/A;  . CATARACT EXTRACTION Bilateral   . EYE SURGERY Bilateral    Cat Sx  . prostate implant         Family History  Problem Relation Age of Onset  . Cancer Mother   . Cancer Father     Social History   Tobacco Use  . Smoking status: Never Smoker  . Smokeless tobacco: Never Used  Substance Use Topics  . Alcohol use: No  . Drug use: No    Home Medications Prior to Admission medications   Medication Sig Start Date End Date Taking? Authorizing Provider  Acetaminophen (TYLENOL PO) Take 2 tablets by mouth every 6 (six) hours as needed (pain/headache).   Yes [provider]  albuterol (PROVENTIL HFA;VENTOLIN HFA) 108 (90 Base) MCG/ACT inhaler Inhale 2 puffs into the lungs every 4 (four) hours as needed for wheezing or shortness of breath (cough, shortness of breath or wheezing.). 08/04/18  Yes Robyn Haber, MD  carvedilol (COREG) 12.5 MG tablet Take 1 tablet (12.5 mg total) by mouth 2 (two)  times daily with a meal. 09/18/19  Yes Patwardhan, Manish J, MD  Cholecalciferol (VITAMIN D3) 250 MCG (10000 UT) TABS Take 1,000 Units by mouth daily.    Yes [provider]  doxazosin (CARDURA) 4 MG tablet TAKE 1 TABLET BY MOUTH AT  BEDTIME Patient taking differently: Take 4 mg by mouth daily with supper.  06/16/19  Yes Glendale Chard, MD  fexofenadine (ALLEGRA) 180 MG tablet Take 180 mg by mouth daily as needed (allergies.).    Yes [provider]  insulin lispro protamine-lispro (HUMALOG 50/50  MIX) (50-50) 100 UNIT/ML SUSP injection Inject 22-25 Units into the skin See admin instructions. Inject 25 units subcutaneously with breakfast and 22 units with supper   Yes [provider]  ketorolac (ACULAR) 0.5 % ophthalmic solution Place 1 drop into both eyes 4 (four) times daily.  08/17/18  Yes [provider]  losartan-hydrochlorothiazide (HYZAAR) 100-25 MG tablet TAKE 1 TABLET BY MOUTH  DAILY Patient taking differently: Take 1 tablet by mouth daily with breakfast.  04/10/19  Yes Glendale Chard, MD  metFORMIN (GLUCOPHAGE-XR) 500 MG 24 hr tablet Take 1,000 mg by mouth 2 (two) times daily with a meal.    Yes [provider]  prednisoLONE acetate (PRED FORTE) 1 % ophthalmic suspension Place 1 drop into both eyes 3 (three) times daily. 04/11/19  Yes [provider]  Insulin Syringe-Needle U-100 (EASY TOUCH INSULIN SYRINGE) 30G X 5/16" 0.5 ML MISC USE 3 TIMES DAILY 09/20/19   Glendale Chard, MD    Allergies    Bidil [isosorb dinitrate-hydralazine] and Statins  Review of Systems   Review of Systems  Unable to perform ROS: Mental status change  LEVEL 5 CAVEAT  Physical Exam Updated Vital Signs BP (!) 150/98   Pulse 67   Temp (!) 97.5 F (36.4 C) (Axillary)   Resp 17   SpO2 99%   Physical Exam Constitutional:      General: He is not in acute distress.    Appearance: He is not diaphoretic.  HENT:     Head: Atraumatic.  Eyes:     Pupils: Pupils are equal, round, and reactive to light.     Comments: Difficulty following commands for EOM assessment.  Cardiovascular:     Rate and Rhythm: Normal rate and regular rhythm.  Pulmonary:     Effort: Pulmonary effort is normal.     Breath sounds: Normal breath sounds.  Abdominal:     General: Bowel sounds are normal.  Musculoskeletal:        General: No signs of injury.  Skin:    General: Skin is warm and dry.  Neurological:     Mental Status: He is disoriented and confused.     Cranial Nerves: No  cranial nerve deficit.     Motor: No weakness.     Coordination: Coordination normal.     Comments: Expressive aphasia present. Please note this improved during his ED stay.  Psychiatric:        Mood and Affect: Mood normal.     ED Results / Procedures / Treatments   Labs (all labs ordered are listed, but only abnormal results are displayed) Labs Reviewed  CBC - Abnormal; Notable for the following components:      Result Value   Hemoglobin 12.7 (*)    All other components within normal limits  COMPREHENSIVE METABOLIC PANEL - Abnormal; Notable for the following components:   Glucose, Bld 131 (*)    Total Protein 5.6 (*)  Albumin 3.1 (*)    GFR calc non Af Amer 52 (*)    All other components within normal limits  ETHANOL  PROTIME-INR  APTT  DIFFERENTIAL  RAPID URINE DRUG SCREEN, HOSP PERFORMED  URINALYSIS, ROUTINE W REFLEX MICROSCOPIC    EKG None  Radiology No results found.  Medications Ordered in ED Medications - No data to display  ED Course  I have reviewed the triage vital signs and the nursing notes.  Pertinent labs & imaging results that were available during my care of the patient were reviewed by me and considered in my medical decision making (see chart for details).  Clinical Course as of Sep 24 1533  Mon Sep 25, 2019  1405 Initial assessment. Patient has significant cognitive slowing. Bilateral hemianospsia. No other focal neuro findings. Ddx: CVA/TIA vs metabolic encephalopathy Sx concerning for CVA. Neurology saw at bedside. Recommending MRI/MRA for better evaluation.Will d/c CT and order MRI/MRA.   [RC]  [id number] Labs unremarkable. No findings to suggest metabolic etiology. No white count to suggest infectious however UA still pending. UDS pending.  Most likely, he is having some confusion after the syncopal event in the cardiology office and  appears that his mentation is improving. Will still obtain head imaging to r/o subacute CVA.    [RC]      Clinical Course User Index [RC] Mitzi Hansen, MD   MDM Rules/Calculators/A&P                     Please see ED course for MDM. Final Clinical Impression(s) / ED Diagnoses Final diagnoses:  None    Rx / DC Orders ED Discharge Orders    None       Mitzi Hansen, MD 09/25/19 1536    Blanchie Dessert, MD 09/26/19 1459

## 2019-09-25 NOTE — ED Triage Notes (Signed)
Pt arrived via [facility] from doctors office. Pt had a syncopal episode at doctors office. Pt alert and oriented when EMS arrived. Pt's speech became muffled in route. Pt received 150 NS.

## 2019-09-25 NOTE — Consult Note (Signed)
Requesting Physician: Dr. Maryan Rued    Chief Complaint:   History obtained from: Patient and Chart    HPI:                                                                                                                                       Marcus Johnston is a 86 y.o. male with past medical history significant for atrial fibrillation not on anticoagulation, diabetes mellitus, hyperlipidemia, hypertension, chronic kidney disease presents to the ED after syncopal event.  He was in his cardiologist earlier today and it was noted that his blood pressure was in the 123456 systolics. Received a dose of Bentyl and subsequently became confused, diaphoretic and had a few seconds of respiratory arrest with blood pressure down to the 60s. CPR was almost initiated however patient became more aware. When EMS arrived patient was sitting and talking normally. There was some concern for expressive aphasia in route to the ED and neurology was consulted. Also on initial evaluation, patient had impaired vision bilaterally, does have known history of diabetic retinopathy.  Patient is a poor historian and does not recall events. History was obtained by EDP.   Date last known well:  Time last known well:  tPA Given:  NIHSS:  Baseline MRS  Past Medical History:  Diagnosis Date  . Atrial fibrillation ([place])   . Atypical atrial flutter ([place]) 10/17/2018  . Diabetes mellitus without complication ([place])   . Diabetic retinopathy ([place])    NPDR OU  . High cholesterol   . History of prostate cancer   . HTN (hypertension)   . Hypertensive retinopathy    OU  . Renal insufficiency   . Shingles     Past Surgical History:  Procedure Laterality Date  . ATRIAL TACH ABLATION N/A 05/08/2019   Procedure: ATRIAL TACH ABLATION;  Surgeon: [name], MD;  Location: [facility];  Service: Cardiovascular;  Laterality: N/A;  . CATARACT EXTRACTION Bilateral   . EYE SURGERY Bilateral    Cat Sx  . prostate implant       Family History  Problem Relation Age of Onset  . Cancer Mother   . Cancer Father    Social History:  reports that he has never smoked. He has never used smokeless tobacco. He reports that he does not drink alcohol or use drugs.  Allergies:  Allergies  Allergen Reactions  . Bidil [Isosorb Dinitrate-Hydralazine] Other (See Comments)    Severe hypotension episode after administering one dose of Bidil 20-37.5 mg on 09/25/2019.   . Statins Other (See Comments)    Leg pain    Medications:  I reviewed home medications   ROS:                                                                                                                                     14 systems reviewed and negative except above   Examination:                                                                                                      General: Appears well-developed  Psych: Affect appropriate to situation Eyes: No scleral injection HENT: No OP obstrucion Head: Normocephalic.  Cardiovascular: Normal rate and regular rhythm.  Respiratory: Effort normal and breath sounds normal to anterior ascultation GI: Soft.  No distension. There is no tenderness.  Skin: WDI    Neurological Examination Mental Status: Alert, oriented, thought content appropriate.  Speech fluent without evidence of aphasia. Speech: Repetition was normal. Reading was normal. Could name most objects but had slight difficulty naming some objects. Also had some difficulty answering questions,  requiring more processing time but eventually stated correctly. Able to follow 3 step commands without difficulty. Cranial Nerves: II: Visual fields grossly normal,  III,IV, VI: ptosis not present, extra-ocular motions intact bilaterally, pupils equal, round, reactive to light and accommodation V,VII: smile symmetric, facial light  touch sensation normal bilaterally VIII: hearing normal bilaterally IX,X: uvula rises symmetrically XI: bilateral shoulder shrug XII: midline tongue extension Motor: Right : Upper extremity   5/5    Left:     Upper extremity   5/5  Lower extremity   5/5     Lower extremity   5/5 Tone and bulk:normal tone throughout; no atrophy noted Sensory: Pinprick and light touch intact throughout, bilaterally Deep Tendon Reflexes: 2+ and symmetric throughout Plantars: Right: downgoing   Left: downgoing Cerebellar: normal finger-to-nose, normal rapid alternating movements and normal heel-to-shin test Gait: normal gait and station     Lab Results: Basic Metabolic Panel: Recent Labs  Lab 09/25/19 1402  NA 142  K 3.7  CL 106  CO2 24  GLUCOSE 131*  BUN 17  CREATININE 1.24  CALCIUM 9.0    CBC: Recent Labs  Lab 09/25/19 1402  WBC 5.7  NEUTROABS 3.4  HGB 12.7*  HCT 39.7  MCV 85.6  PLT 172    Coagulation Studies: Recent Labs    09/25/19 1402  LABPROT 14.4  INR 1.1    Imaging: No results found.   ASSESSMENT AND PLAN  86 y.o. male with past medical history significant for atrial  fibrillation not on anticoagulation, diabetes mellitus, hyperlipidemia, hypertension, chronic kidney disease presents to the ED with AMS after syncopal event. On my assessment, felt patient had slowness in thinking, confusion rather than aphasia with intact repetition and reading. [place] stroke in the setting hypertension could cause transcortical aphasia and therefore recommed MRI brain as well as MRA head.     Hypertensive emergency Acute encephalopathy following syncope Possible acute ischemic stroke Atrial tachycardia s/p ablation   Recommendations MRI brain to rule out stroke MRA of the head   Sushanth Aroor [facility] Pager Number [phone]

## 2019-09-25 NOTE — ED Notes (Signed)
Patient verbalizes understanding of discharge instructions. Opportunity for questioning and answers were provided. All questions answered completely. PIVs removed, catheters intact. Sites dressed with gauze and tape. Armband removed by staff, pt discharged from ED via wheelchair and picked up by his wife

## 2019-09-25 NOTE — Discharge Instructions (Addendum)
The syncope was likely because of low blood pressure associated with taking the BiDil.  You should not take it again.  Your cardiologist will call you, to arrange further evaluation and treatment of your blood pressure.  It would be a good idea to see a neurologist to make sure everything is okay with your brain and blood vessels to the head.  We did not see a stroke that would have caused this problem today.  You appear to have had a prior stroke, remotely.  Return here, if needed, for problems.

## 2019-09-25 NOTE — ED Notes (Signed)
Pt alert, resting on cart in NAD. VSS on monitors. Breathing easy, non-labored. Equal rise and fall of chest noted. Call light within reach. Hourly rounds completed

## 2019-09-25 NOTE — ED Notes (Signed)
Pt provided with phone to call wife. Pt's wife on her way to pick up patient

## 2019-09-25 NOTE — ED Provider Notes (Signed)
4:20 PM-checkout from prior team to evaluate after imaging, and labs.  I discussed the case with Dr.Aroor, who looked at the MRI results.  He is aware of the case.  Results officially read by radiology as no acute infarct, and possible stenosis posterior cerebral arteries but no other acute abnormality.   8:45 PM- Patient's cardiologist who was with him today when he had the episode of could be also stopped by, but patient was an MRI at that time.  He states that he has seen vital cause abrupt hypotension, like this previously.  The patient's blood pressure is improved, and acceptable for discharge.   At this time the patient is alert, cooperative.  Discussed the findings with him.  He is comfortable and feels like he is ready to go home.  Instructed him to follow-up with his cardiologist, a neurologist for general checkup and PCP as needed.     Daleen Bo, MD 09/25/19 [phone]

## 2019-09-25 NOTE — ED Notes (Signed)
Assumed care of pt. Pt alert, resting on cart in NAD. VSS on monitors. Breathing easy, non-labored. Call light within reach. Will continue to monitor

## 2019-09-26 ENCOUNTER — Telehealth: Payer: Self-pay | Admitting: Cardiology

## 2019-09-26 DIAGNOSIS — I952 Hypotension due to drugs: Secondary | ICD-10-CM

## 2019-09-26 DIAGNOSIS — I10 Essential (primary) hypertension: Secondary | ICD-10-CM

## 2019-09-26 NOTE — Telephone Encounter (Signed)
I spoke with the patient the phone today and went over events from yesterday.  I was reassured to know that patient did not have any stroke.  Essentially had global hypoperfusion with sudden hypotension as a response to 1 dose of BiDil 20-37.5 mg.  I have marked this medication as contraindication on his chart.    Patient has whitecoat hypertension, which is verified by comparing his home monitor with office monitor.  I would refrain from making any large changes to his blood pressure medications unless blood pressures are consistently elevated at home.  Nigel Mormon, MD

## 2019-09-29 NOTE — Progress Notes (Signed)
[facility]  10/04/2019     CHIEF COMPLAINT Patient presents for Retina Follow Up   HISTORY OF PRESENT ILLNESS: Marcus Johnston is a 86 y.o. male who presents to the clinic today for:   HPI    Retina Follow Up    Patient presents with  Diabetic Retinopathy.  In both eyes.  This started 4 weeks ago.  Severity is moderate.  I, the attending physician,  performed the HPI with the patient and updated documentation appropriately.          Comments    Patient here for 4 weeks retina follow up for NPDR OU. Patient states vision about the same. No eye pain.        Last edited by [name], MD on 10/04/2019  4:21 PM. (History)    Patient states vision seems the same OU. Last a1c was 7.9 on 09.13.20.  Referring physician: Glendale Chard, MD [address] [place],  [place] [address]  HISTORICAL INFORMATION:   Selected notes from the MEDICAL RECORD NUMBER Referred by Dr. [name] for CME OU LEE: 12.01.20 (S. Groat) [BCVA: OD: 20/60-- OS: 20/150+] Ocular Hx-glc suspect (former pt of Dr. Sammie Bench / Dr. Venetia Maxon) PMH-A fib, DM (A1c: 7.9 [09.13.20] takes metformin .and humalog), prostate cancer, HTN, HLD   CURRENT MEDICATIONS: Current Outpatient Medications (Ophthalmic Drugs)  Medication Sig  . ketorolac (ACULAR) 0.5 % ophthalmic solution Place 1 drop into both eyes 4 (four) times daily.   . prednisoLONE acetate (PRED FORTE) 1 % ophthalmic suspension Place 1 drop into both eyes 3 (three) times daily.   No current facility-administered medications for this visit. (Ophthalmic Drugs)   Current Outpatient Medications (Other)  Medication Sig  . Acetaminophen (TYLENOL PO) Take 2 tablets by mouth every 6 (six) hours as needed (pain/headache).  Marland Kitchen albuterol (PROVENTIL HFA;VENTOLIN HFA) 108 (90 Base) MCG/ACT inhaler Inhale 2 puffs into the lungs every 4 (four) hours as needed for wheezing or shortness of breath (cough, shortness of breath  or wheezing.).  Marland Kitchen carvedilol (COREG) 12.5 MG tablet Take 1 tablet (12.5 mg total) by mouth 2 (two) times daily with a meal.  . Cholecalciferol (VITAMIN D3) 250 MCG (10000 UT) TABS Take 1,000 Units by mouth daily.   Marland Kitchen doxazosin (CARDURA) 4 MG tablet TAKE 1 TABLET BY MOUTH AT  BEDTIME (Patient taking differently: Take 4 mg by mouth daily with supper. )  . fexofenadine (ALLEGRA) 180 MG tablet Take 180 mg by mouth daily as needed (allergies.).   Marland Kitchen insulin lispro protamine-lispro (HUMALOG 50/50 MIX) (50-50) 100 UNIT/ML SUSP injection Inject 22-25 Units into the skin See admin instructions. Inject 25 units subcutaneously with breakfast and 22 units with supper  . Insulin Syringe-Needle U-100 (EASY TOUCH INSULIN SYRINGE) 30G X 5/16" 0.5 ML MISC USE 3 TIMES DAILY  . losartan-hydrochlorothiazide (HYZAAR) 100-25 MG tablet TAKE 1 TABLET BY MOUTH  DAILY (Patient taking differently: Take 1 tablet by mouth daily with breakfast. )  . metFORMIN (GLUCOPHAGE-XR) 500 MG 24 hr tablet Take 1,000 mg by mouth 2 (two) times daily with a meal.    No current facility-administered medications for this visit. (Other)      REVIEW OF SYSTEMS: ROS    Positive for: Neurological, Genitourinary, Endocrine, Cardiovascular, Eyes, Respiratory   Negative for: Constitutional, Gastrointestinal, Skin, Musculoskeletal, HENT, Psychiatric, Allergic/Imm, Heme/Lymph   Last edited by Theodore Demark, COA on 10/04/2019  9:43 AM. (History)  ALLERGIES Allergies  Allergen Reactions  . Bidil [Isosorb Dinitrate-Hydralazine] Other (See Comments)    Severe hypotension episode after administering one dose of Bidil 20-37.5 mg on 09/25/2019.   . Statins Other (See Comments)    Leg pain    PAST MEDICAL HISTORY Past Medical History:  Diagnosis Date  . Atrial fibrillation ([place])   . Atypical atrial flutter ([place]) 10/17/2018  . Diabetes mellitus without complication ([place])   . Diabetic retinopathy ([place])    NPDR OU  . High cholesterol    . History of prostate cancer   . HTN (hypertension)   . Hypertensive retinopathy    OU  . Renal insufficiency   . Shingles    Past Surgical History:  Procedure Laterality Date  . ATRIAL TACH ABLATION N/A 05/08/2019   Procedure: ATRIAL TACH ABLATION;  Surgeon: [name], MD;  Location: [facility];  Service: Cardiovascular;  Laterality: N/A;  . CATARACT EXTRACTION Bilateral   . EYE SURGERY Bilateral    Cat Sx  . prostate implant      FAMILY HISTORY Family History  Problem Relation Age of Onset  . Cancer Mother   . Cancer Father     SOCIAL HISTORY Social History   Tobacco Use  . Smoking status: Never Smoker  . Smokeless tobacco: Never Used  Substance Use Topics  . Alcohol use: No  . Drug use: No         OPHTHALMIC EXAM:  Base Eye Exam    Visual Acuity (Snellen - Linear)      Right Left   Dist [place] 20/100 -1 20/150   Dist ph [place] 20/30 20/100 -2  Patient did not bring glasses.       Tonometry (Tonopen, 9:41 AM)      Right Left   Pressure 10 9       Pupils      Dark Light Shape React APD   Right 3 2 Round Brisk None   Left 3 2 Round Brisk None       Visual Fields (Counting fingers)      Left Right    Full Full       Extraocular Movement      Right Left    Full, Ortho Full, Ortho       Neuro/Psych    Oriented x3: Yes   Mood/Affect: Normal       Dilation    Both eyes: 1.0% Mydriacyl, 2.5% Phenylephrine @ 9:41 AM        Slit Lamp and Fundus Exam    Slit Lamp Exam      Right Left   Lids/Lashes Dermatochalasis - upper lid, mild Meibomian gland dysfunction Dermatochalasis - upper lid, mild Meibomian gland dysfunction   Conjunctiva/Sclera Mild Melanosis Mild Melanosis   Cornea Trace Punctate epithelial erosions 1+ Punctate epithelial erosions. endopigment   Anterior Chamber Deep and quiet Deep and quiet   Iris Round and dilated, No NVI Round and dilated, No NVI   Lens Toric PC IOL with marks at 1200 and 0600 PC IOL in good  position with PC folds   Vitreous Vitreous syneresis, PVD and vitreous condensations Vitreous syneresis       Fundus Exam      Right Left   Disc Mild Pallor, mild, temporal Peripapillary atrophy, Sharp rim temporal Peripapillary atrophy, +cupping, mild Pallor, Sharp rim   C/D Ratio 0.6 0.65   Macula Blunted foveal reflex, central IRH -- improving, pigment clumping, Microaneurysms, +central cystic changes--slightly  improved Blunted foveal reflex, Retinal pigment epithelial mottling and clumping, focal Exudates, +IRH, +central Cystic changes -- slightly improved, scattered Microaneurysms   Vessels Vascular attenuation, mild Copper wiring, AV crossing changes Vascular attenuation, mild Copper wiring, AV crossing changes   Periphery Attached, large HST at 0500 with surrounding SRF, +pigmented barrier laser scar from 0430-0600    Attached, small pigmented operculum at 0730 midzone, no SRF           Refraction    Wearing Rx      Sphere Cylinder Axis Add   Right -3.25 +1.00 142 +3.00   Left -3.50 +2.25 057 +3.00   Type: PAL          IMAGING AND PROCEDURES  Imaging and Procedures for @TODAY @  OCT, Retina - OU - Both Eyes       Right Eye Quality was good. Central Foveal Thickness: 452. Progression has improved. Findings include abnormal foveal contour, intraretinal fluid, no SRF (Persistent IRF improving, Trace ERM).   Left Eye Quality was good. Central Foveal Thickness: 367. Progression has improved. Findings include intraretinal fluid, abnormal foveal contour, intraretinal hyper-reflective material, no SRF, outer retinal atrophy (Mild ERM; interval improvement in IRF and foveal profile).   Notes *Images captured and stored on drive  Diagnosis / Impression:  OD: abnormal foveal profile w/ +IRF -- macular edema (DME +/- CME component) -- Persistent IRF with mild interval improvement, Trace ERM OS: abnormal foveal profile w/ +IRF/IRHM -- DME; interval improvement in IRF and foveal  profile  Clinical management:  See below  Abbreviations: NFP - Normal foveal profile. CME - cystoid macular edema. PED - pigment epithelial detachment. IRF - intraretinal fluid. SRF - subretinal fluid. EZ - ellipsoid zone. ERM - epiretinal membrane. ORA - outer retinal atrophy. ORT - outer retinal tubulation. SRHM - subretinal hyper-reflective material        Intravitreal Injection, Pharmacologic Agent - OD - Right Eye       Time Out 10/04/2019. 9:58 AM. Confirmed correct patient, procedure, site, and patient consented.   Anesthesia Topical anesthesia was used (32 g). Anesthetic medications included Lidocaine 2%, Proparacaine 0.5%.   Procedure Preparation included 5% betadine to ocular surface, eyelid speculum. A (32 g) needle was used.   Injection:  2 mg aflibercept Alfonse Flavors) SOLN   NDC: O5083423, Lot: [id number], Expiration date: 03/06/2020   Route: Intravitreal, Site: Right Eye, Waste: 0.05 mL  Post-op Post injection exam found visual acuity of at least counting fingers. The patient tolerated the procedure well. There were no complications. The patient received written and verbal post procedure care education.        Intravitreal Injection, Pharmacologic Agent - OS - Left Eye       Time Out 10/04/2019. 10:00 AM. Confirmed correct patient, procedure, site, and patient consented.   Anesthesia Topical anesthesia was used. Anesthetic medications included Lidocaine 2%, Proparacaine 0.5%.   Procedure Preparation included 5% betadine to ocular surface, eyelid speculum. A (32 g) needle was used.   Injection:  2 mg aflibercept Alfonse Flavors) SOLN   NDC: L454919, Lot: [id number], Expiration date: 02/05/2020   Route: Intravitreal, Site: Left Eye, Waste: 0.05 mL  Post-op Post injection exam found visual acuity of at least counting fingers. The patient tolerated the procedure well. There were no complications. The patient received written and verbal post procedure care  education.                 ASSESSMENT/PLAN:    ICD-10-CM   1. Moderate nonproliferative diabetic  retinopathy of both eyes with macular edema associated with type 2 diabetes mellitus (HCC)  [id number] Intravitreal Injection, Pharmacologic Agent - OD - Right Eye    Intravitreal Injection, Pharmacologic Agent - OS - Left Eye    aflibercept (EYLEA) SOLN 2 mg    aflibercept (EYLEA) SOLN 2 mg  2. Retinal edema  H35.81 OCT, Retina - OU - Both Eyes  3. History of retinal detachment  Z86.69   4. Essential hypertension  I10   5. Hypertensive retinopathy of both eyes  H35.033   6. Pseudophakia of both eyes  Z96.1     1,2.  Non-proliferative diabetic retinopathy w/ macular edema OU  - patient with long standing history of macular edema -- was seen by Dr. Zigmund Daniel back in 2013  - has also seen Dr. Baird Cancer, Dr. Posey Pronto, [place] [facility]  - last retina f/u in 2019 with Dr. Posey Pronto and at [facility]  - pt has previously received intravitreal injections OU, but does not like them and felt like benefits from injections were minmal  - currently using PF and ketorolac QID OU  - exam shows scattered/minimal MA but central edema OU  - FA (12.11.20) shows late leaking MA, OS shows capillary nonperfusion, OD with ?petaloid central hyperfluorescence -- ?CME component  - s/p IVA# 1 OD (12.11.20)  - s/p IVE #1 OD (01.20.21)  - s/p IVE# 1 OS (12.11.20), #2 (01.20.21)  - BCVA OD: 20/30; OS: 20/100-2--stable from prior  - OCT shows persistent IRF with mild interval improvement OD; interval improvement in IRF OS   - Eylea4U benefits investigation initiated 12.11.20 -- approved for 2021  - recommend IVE #2 OD and IVE OS #3 today, 02.17.21  - pt wishes to proceed  - RBA of procedure discussed, questions answered  - informed consent obtained  - see procedure note  - Eylea informed consent form signed and scanned on 1.20.21  - f/u in 4 wks -- DFE/OCT/possible injection  3. History of inferior HST w/  focal RD OD s/p laser retinopexy  - HST at 0500 w/ surrounding SRF  - good barricade laser in place spanning 0430-0600  - stable  4,5. Hypertensive retinopathy OU  - discussed importance of tight BP control  - monitor  6. Pseudophakia OU  - s/p CE/IOL OU  - beautiful surgeries, doing well  - monitor   Ophthalmic Meds Ordered this visit:  Meds ordered this encounter  Medications  . aflibercept (EYLEA) SOLN 2 mg  . aflibercept (EYLEA) SOLN 2 mg       Return in about 4 weeks (around 11/01/2019) for DFE, OCT.  There are no Patient Instructions on file for this visit.   Explained the diagnoses, plan, and follow up with the patient and they expressed understanding.  Patient expressed understanding of the importance of proper follow up care.   This document serves as a record of services personally performed by Gardiner Sleeper, MD, PhD. It was created on their behalf by Roselee Nova, COMT. The creation of this record is the provider's dictation and/or activities during the visit.  Electronically signed by: Roselee Nova, COMT 10/05/19 9:50 PM  Gardiner Sleeper, M.D., Ph.D. Diseases & Surgery of the Retina and Spur 10/04/2019   I have reviewed the above documentation for accuracy and completeness, and I agree with the above. Gardiner Sleeper, M.D., Ph.D. 10/05/19 9:50 PM  Abbreviations: M myopia (nearsighted); A astigmatism; H hyperopia (farsighted); P presbyopia; Mrx  spectacle prescription;  CTL contact lenses; OD right eye; OS left eye; OU both eyes  XT exotropia; ET esotropia; PEK punctate epithelial keratitis; PEE punctate epithelial erosions; DES dry eye syndrome; MGD meibomian gland dysfunction; ATs artificial tears; PFAT's preservative free artificial tears; [place] nuclear sclerotic cataract; PSC posterior subcapsular cataract; ERM epi-retinal membrane; PVD posterior vitreous detachment; RD retinal detachment; DM diabetes mellitus; DR diabetic  retinopathy; NPDR non-proliferative diabetic retinopathy; PDR proliferative diabetic retinopathy; CSME clinically significant macular edema; DME diabetic macular edema; dbh dot blot hemorrhages; CWS cotton wool spot; POAG primary open angle glaucoma; C/D cup-to-disc ratio; HVF humphrey visual field; GVF goldmann visual field; OCT optical coherence tomography; IOP intraocular pressure; BRVO Branch retinal vein occlusion; CRVO central retinal vein occlusion; CRAO central retinal artery occlusion; BRAO branch retinal artery occlusion; RT retinal tear; SB scleral buckle; PPV pars plana vitrectomy; VH Vitreous hemorrhage; PRP panretinal laser photocoagulation; IVK intravitreal kenalog; VMT vitreomacular traction; MH Macular hole;  NVD neovascularization of the disc; NVE neovascularization elsewhere; AREDS age related eye disease study; ARMD age related macular degeneration; POAG primary open angle glaucoma; EBMD epithelial/anterior basement membrane dystrophy; ACIOL anterior chamber intraocular lens; IOL intraocular lens; PCIOL posterior chamber intraocular lens; Phaco/IOL phacoemulsification with intraocular lens placement; [place] photorefractive keratectomy; LASIK laser assisted in situ keratomileusis; HTN hypertension; DM diabetes mellitus; COPD chronic obstructive pulmonary disease

## 2019-10-04 ENCOUNTER — Encounter (INDEPENDENT_AMBULATORY_CARE_PROVIDER_SITE_OTHER): Payer: Self-pay | Admitting: Ophthalmology

## 2019-10-04 ENCOUNTER — Ambulatory Visit (INDEPENDENT_AMBULATORY_CARE_PROVIDER_SITE_OTHER): Payer: Medicare Other | Admitting: Ophthalmology

## 2019-10-04 DIAGNOSIS — H35033 Hypertensive retinopathy, bilateral: Secondary | ICD-10-CM

## 2019-10-04 DIAGNOSIS — Z8669 Personal history of other diseases of the nervous system and sense organs: Secondary | ICD-10-CM | POA: Diagnosis not present

## 2019-10-04 DIAGNOSIS — H3581 Retinal edema: Secondary | ICD-10-CM

## 2019-10-04 DIAGNOSIS — I10 Essential (primary) hypertension: Secondary | ICD-10-CM

## 2019-10-04 DIAGNOSIS — E113313 Type 2 diabetes mellitus with moderate nonproliferative diabetic retinopathy with macular edema, bilateral: Secondary | ICD-10-CM

## 2019-10-04 DIAGNOSIS — Z961 Presence of intraocular lens: Secondary | ICD-10-CM

## 2019-10-05 MED ORDER — AFLIBERCEPT 2MG/0.05ML IZ SOLN FOR KALEIDOSCOPE
2.0000 mg | INTRAVITREAL | Status: AC | PRN
  Administered 2019-10-05: 2 mg via INTRAVITREAL

## 2019-10-05 MED ORDER — AFLIBERCEPT 2MG/0.05ML IZ SOLN FOR KALEIDOSCOPE
2.0000 mg | INTRAVITREAL | Status: AC | PRN
  Administered 2019-10-05: 22:00:00 2 mg via INTRAVITREAL

## 2019-10-11 ENCOUNTER — Telehealth: Payer: Self-pay

## 2019-10-11 NOTE — Telephone Encounter (Signed)
I was calling the pt because Dr. Baird Cancer wanted to advise the pt that the pt's Doxazosin 4 mg medication needed to decrease have the dose decreased and dr sanders wants to know if the pt recalls who initially prescribed this form him was it his pcp or his prostate specialist.

## 2019-10-11 NOTE — Telephone Encounter (Signed)
The pt called and said that the doctor that prescribed the Doxazosin was a provider that has retired, that he can't remember the doctor's name but that the doctor was a prostate specialist.

## 2019-10-17 ENCOUNTER — Telehealth: Payer: Self-pay

## 2019-10-17 NOTE — Telephone Encounter (Signed)
The pt said yes his doxazosin medication can be cut in half and that he will start the lower dose.

## 2019-10-17 NOTE — Telephone Encounter (Signed)
I was calling the pt because Dr. Baird Cancer wanted to know if his Doxazosin medication can be split in half to a lower dose because the medication may be contributing to his dizziness.

## 2019-10-31 NOTE — Progress Notes (Signed)
[facility] Note  11/01/2019     CHIEF COMPLAINT Patient presents for Retina Follow Up   HISTORY OF PRESENT ILLNESS: Marcus Johnston is a 86 y.o. male who presents to the clinic today for:   HPI    Retina Follow Up    Patient presents with  Diabetic Retinopathy.  In both eyes.  Severity is moderate.  Duration of 4 weeks.  Since onset it is stable.  I, the attending physician,  performed the HPI with the patient and updated documentation appropriately.          Comments    Patient states vision the same OU. BS was 135 this am. Last a1c was 7.2, about 4-5 months ago.        Last edited by [name], MD on 11/04/2019 10:20 PM. (History)    Patient states he feels like his vision is the same as last visit, he states his blood pressure meds have been changed bc he was at the dr and it was over 200, he states the medicine made him pass out, he states he checks his blood pressure at home, but does not trust his meter, he states he has white coat syndrome so he doesn't get accurate readings at the dr either, he states his blood pressure problems started back in September where he was treated for tachycardia   Referring physician: Glendale Chard, MD [address] [place],  [place] [address]  HISTORICAL INFORMATION:   Selected notes from the [facility] Referred by Dr. [name] for CME OU LEE: 12.01.20 ([facility]. [place]) [BCVA: OD: 20/60-- OS: 20/150+] Ocular Hx-glc suspect (former pt of Dr. Sammie Bench / Dr. Venetia Maxon) PMH-A fib, DM (A1c: 7.9 [09.13.20] takes metformin .and humalog), prostate cancer, HTN, HLD   CURRENT MEDICATIONS: Current Outpatient Medications (Ophthalmic Drugs)  Medication Sig  . ketorolac (ACULAR) 0.5 % ophthalmic solution Place 1 drop into both eyes 4 (four) times daily.   . prednisoLONE acetate (PRED FORTE) 1 % ophthalmic suspension Place 1 drop into both eyes 3 (three) times daily.   No current  facility-administered medications for this visit. (Ophthalmic Drugs)   Current Outpatient Medications (Other)  Medication Sig  . Acetaminophen (TYLENOL PO) Take 2 tablets by mouth every 6 (six) hours as needed (pain/headache).  Marland Kitchen albuterol (PROVENTIL HFA;VENTOLIN HFA) 108 (90 Base) MCG/ACT inhaler Inhale 2 puffs into the lungs every 4 (four) hours as needed for wheezing or shortness of breath (cough, shortness of breath or wheezing.).  Marland Kitchen carvedilol (COREG) 12.5 MG tablet Take 1 tablet (12.5 mg total) by mouth 2 (two) times daily with a meal.  . Cholecalciferol (VITAMIN D3) 250 MCG (10000 UT) TABS Take 1,000 Units by mouth daily.   Marland Kitchen doxazosin (CARDURA) 4 MG tablet TAKE 1 TABLET BY MOUTH AT  BEDTIME (Patient taking differently: Take 4 mg by mouth daily with supper. 1/2 tab daily)  . fexofenadine (ALLEGRA) 180 MG tablet Take 180 mg by mouth daily as needed (allergies.).   Marland Kitchen insulin lispro protamine-lispro (HUMALOG 50/50 MIX) (50-50) 100 UNIT/ML SUSP injection Inject 22-25 Units into the skin See admin instructions. Inject 25 units subcutaneously with breakfast and 22 units with supper  . Insulin Syringe-Needle U-100 (EASY TOUCH INSULIN SYRINGE) 30G X 5/16" 0.5 ML MISC USE 3 TIMES DAILY  . losartan-hydrochlorothiazide (HYZAAR) 100-25 MG tablet TAKE 1 TABLET BY MOUTH  DAILY (Patient taking differently: Take 1 tablet by mouth daily with breakfast. )  . metFORMIN (  GLUCOPHAGE-XR) 500 MG 24 hr tablet Take 1,000 mg by mouth 2 (two) times daily with a meal.    No current facility-administered medications for this visit. (Other)      REVIEW OF SYSTEMS: ROS    Positive for: Neurological, Genitourinary, Endocrine, Cardiovascular, Eyes, Respiratory   Negative for: Constitutional, Gastrointestinal, Skin, Musculoskeletal, HENT, Psychiatric, Allergic/Imm, Heme/Lymph   Last edited by Roselee Nova D, COT on 11/01/2019  9:50 AM. (History)       ALLERGIES Allergies  Allergen Reactions  . Bidil [Isosorb  Dinitrate-Hydralazine] Other (See Comments)    Severe hypotension episode after administering one dose of Bidil 20-37.5 mg on 09/25/2019.   . Statins Other (See Comments)    Leg pain    PAST MEDICAL HISTORY Past Medical History:  Diagnosis Date  . Atrial fibrillation ([place])   . Atypical atrial flutter ([place]) 10/17/2018  . Diabetes mellitus without complication ([place])   . Diabetic retinopathy ([place])    NPDR OU  . High cholesterol   . History of prostate cancer   . HTN (hypertension)   . Hypertensive retinopathy    OU  . Renal insufficiency   . Shingles    Past Surgical History:  Procedure Laterality Date  . ATRIAL TACH ABLATION N/A 05/08/2019   Procedure: ATRIAL TACH ABLATION;  Surgeon: [name], MD;  Location: [facility];  Service: Cardiovascular;  Laterality: N/A;  . CATARACT EXTRACTION Bilateral   . EYE SURGERY Bilateral    Cat Sx  . prostate implant      FAMILY HISTORY Family History  Problem Relation Age of Onset  . Cancer Mother   . Cancer Father     SOCIAL HISTORY Social History   Tobacco Use  . Smoking status: Never Smoker  . Smokeless tobacco: Never Used  Substance Use Topics  . Alcohol use: No  . Drug use: No         OPHTHALMIC EXAM:  Base Eye Exam    Visual Acuity (Snellen - Linear)      Right Left   Dist cc 20/30 -2 20/100 -2   Dist ph cc 20/30 +1 20/100 +1   Correction: Glasses       Tonometry (Tonopen, 10:00 AM)      Right Left   Pressure 15 14       Neuro/Psych    Oriented x3: Yes   Mood/Affect: Normal       Dilation    Both eyes: 1.0% Mydriacyl, 2.5% Phenylephrine @ 10:00 AM        Slit Lamp and Fundus Exam    Slit Lamp Exam      Right Left   Lids/Lashes Dermatochalasis - upper lid, mild Meibomian gland dysfunction Dermatochalasis - upper lid, mild Meibomian gland dysfunction   Conjunctiva/Sclera Mild Melanosis Mild Melanosis   Cornea Trace Punctate epithelial erosions 1+ Punctate epithelial erosions. endopigment    Anterior Chamber Deep and quiet Deep and quiet   Iris Round and dilated, No NVI Round and dilated, No NVI   Lens Toric PC IOL with marks at 1200 and 0600 PC IOL in good position with PC folds   Vitreous Vitreous syneresis, PVD and vitreous condensations Vitreous syneresis       Fundus Exam      Right Left   Disc Mild Pallor, mild, temporal Peripapillary atrophy, Sharp rim temporal Peripapillary atrophy, +cupping, mild Pallor, Sharp rim   C/D Ratio 0.6 0.65   Macula Blunted foveal reflex, central IRH -- persistent, pigment  clumping, Microaneurysms, +central cystic changes--slightly increased Blunted foveal reflex, Retinal pigment epithelial mottling and clumping, focal Exudates, +IRH, +central Cystic changes -- slightly improved, scattered Microaneurysms   Vessels Vascular attenuation, mild Copper wiring, AV crossing changes Vascular attenuation, mild Copper wiring, AV crossing changes   Periphery Attached, large HST at 0500 with surrounding SRF, +pigmented barrier laser scar from 0430-0600    Attached, small pigmented operculum at 0730 midzone, no SRF           Refraction    Wearing Rx      Sphere Cylinder Axis Add   Right -3.25 +1.00 142 +3.00   Left -3.50 +2.25 057 +3.00   Type: PAL       Manifest Refraction      Sphere Cylinder Axis Dist VA   Right -2.50 +0.75 140 20/30-1   Left -2.75 +2.00 060 20/100+1          IMAGING AND PROCEDURES  Imaging and Procedures for @TODAY @  OCT, Retina - OU - Both Eyes       Right Eye Quality was good. Central Foveal Thickness: 485. Progression has worsened. Findings include abnormal foveal contour, intraretinal fluid, no SRF (Mild interval increase in IRF; Trace ERM).   Left Eye Quality was good. Central Foveal Thickness: 367. Progression has been stable. Findings include intraretinal fluid, abnormal foveal contour, intraretinal hyper-reflective material, no SRF, outer retinal atrophy (Mild ERM; persistent IRF -- no improvement from  prior).   Notes *Images captured and stored on drive  Diagnosis / Impression:  OD: abnormal foveal profile w/ +IRF -- macular edema (DME +/- CME component) -- mild interval increase in IRF; Trace ERM OS: abnormal foveal profile w/ +IRF/IRHM -- DME; no improvement from prior  Clinical management:  See below  Abbreviations: NFP - Normal foveal profile. CME - cystoid macular edema. PED - pigment epithelial detachment. IRF - intraretinal fluid. SRF - subretinal fluid. EZ - ellipsoid zone. ERM - epiretinal membrane. ORA - outer retinal atrophy. ORT - outer retinal tubulation. SRHM - subretinal hyper-reflective material        Intravitreal Injection, Pharmacologic Agent - OD - Right Eye       Time Out 11/01/2019. 11:01 AM. Confirmed correct patient, procedure, site, and patient consented.   Anesthesia Topical anesthesia was used. Anesthetic medications included Lidocaine 2%, Proparacaine 0.5%.   Procedure Preparation included 5% betadine to ocular surface, eyelid speculum. A (32g) needle was used.   Injection:  2 mg aflibercept Alfonse Flavors) SOLN   NDC: O5083423, Lot: [id number], Expiration date: 04/12/2020   Route: Intravitreal, Site: Right Eye, Waste: 0.05 mL  Post-op Post injection exam found visual acuity of at least counting fingers. The patient tolerated the procedure well. There were no complications. The patient received written and verbal post procedure care education.        Intravitreal Injection, Pharmacologic Agent - OS - Left Eye       Time Out 11/01/2019. 11:02 AM. Confirmed correct patient, procedure, site, and patient consented.   Anesthesia Topical anesthesia was used. Anesthetic medications included Lidocaine 2%, Proparacaine 0.5%.   Procedure Preparation included 5% betadine to ocular surface, eyelid speculum. A (32g) needle was used.   Injection:  2 mg aflibercept Alfonse Flavors) SOLN   NDC: O5083423, Lot: [id number], Expiration date: 04/12/2020   Route:  Intravitreal, Site: Left Eye, Waste: 0.05 mL  Post-op Post injection exam found visual acuity of at least counting fingers. The patient tolerated the procedure well. There were no complications. The patient received written and  verbal post procedure care education.                 ASSESSMENT/PLAN:    ICD-10-CM   1. Moderate nonproliferative diabetic retinopathy of both eyes with macular edema associated with type 2 diabetes mellitus (HCC)  [id number] Intravitreal Injection, Pharmacologic Agent - OD - Right Eye    Intravitreal Injection, Pharmacologic Agent - OS - Left Eye    aflibercept (EYLEA) SOLN 2 mg    aflibercept (EYLEA) SOLN 2 mg  2. Retinal edema  H35.81 OCT, Retina - OU - Both Eyes  3. History of retinal detachment  Z86.69   4. Essential hypertension  I10   5. Hypertensive retinopathy of both eyes  H35.033   6. Pseudophakia of both eyes  Z96.1     1,2.  Non-proliferative diabetic retinopathy w/ macular edema OU  - patient with long standing history of macular edema -- was seen by Dr. Zigmund Daniel back in 2013  - has also seen Dr. Baird Cancer, Dr. Posey Pronto, [facility] at [facility]  - last retina f/u in 2019 with Dr. Posey Pronto and at [facility]  - pt has previously received intravitreal injections OU, but does not like them and felt like benefits from injections were minmal  - currently using PF and ketorolac QID OU  - exam shows scattered/minimal MA but central edema OU  - FA (12.11.20) shows late leaking MA, OS shows capillary nonperfusion, OD with ?petaloid central hyperfluorescence -- ?CME component  - s/p IVA# 1 OD (12.11.20)  - s/p IVE #1 OD (01.20.21), #2 (02.17.21)  - s/p IVE# 1 OS (12.11.20), #2 (01.20.21), #3 (02.17.21)  - BCVA OD: 20/30; OS: 20/100-2--stable from prior  - OCT shows mild interval increase IRF OD; persistent IRF OS   - Eylea4U benefits investigation initiated 12.11.20 -- approved for 2021  - recommend IVE #3 OD and IVE OS #4 today, 03.17.21  - pt wishes  to proceed  - RBA of procedure discussed, questions answered  - informed consent obtained  - see procedure note  - Eylea informed consent form signed and scanned on 1.20.21  - f/u in 4 wks -- DFE/OCT/possible injection  3. History of inferior HST w/ focal RD OD s/p laser retinopexy  - HST at 0500 w/ surrounding SRF  - good barricade laser in place spanning 0430-0600  - stable  4,5. Hypertensive retinopathy OU  - discussed importance of tight BP control  - monitor  6. Pseudophakia OU  - s/p CE/IOL OU  - beautiful surgeries, doing well  - monitor   Ophthalmic Meds Ordered this visit:  Meds ordered this encounter  Medications  . aflibercept (EYLEA) SOLN 2 mg  . aflibercept (EYLEA) SOLN 2 mg       Return in about 4 weeks (around 11/29/2019) for f/u NPDR OU, DFE, OCT.  There are no Patient Instructions on file for this visit.   Explained the diagnoses, plan, and follow up with the patient and they expressed understanding.  Patient expressed understanding of the importance of proper follow up care.   This document serves as a record of services personally performed by Gardiner Sleeper, MD, PhD. It was created on their behalf by Roselee Nova, COMT. The creation of this record is the provider's dictation and/or activities during the visit.  Electronically signed by: Roselee Nova, COMT 11/04/19 10:55 PM  Gardiner Sleeper, M.D., Ph.D. Diseases & Surgery of the Retina and [place] 11/01/2019   I have  reviewed the above documentation for accuracy and completeness, and I agree with the above. Gardiner Sleeper, M.D., Ph.D. 11/04/19 10:55 PM    Abbreviations: M myopia (nearsighted); A astigmatism; H hyperopia (farsighted); P presbyopia; Mrx spectacle prescription;  CTL contact lenses; OD right eye; OS left eye; OU both eyes  XT exotropia; ET esotropia; PEK punctate epithelial keratitis; PEE punctate epithelial erosions; DES dry eye syndrome; MGD  meibomian gland dysfunction; ATs artificial tears; PFAT's preservative free artificial tears; [place] nuclear sclerotic cataract; PSC posterior subcapsular cataract; ERM epi-retinal membrane; PVD posterior vitreous detachment; RD retinal detachment; DM diabetes mellitus; DR diabetic retinopathy; NPDR non-proliferative diabetic retinopathy; PDR proliferative diabetic retinopathy; CSME clinically significant macular edema; DME diabetic macular edema; dbh dot blot hemorrhages; CWS cotton wool spot; POAG primary open angle glaucoma; C/D cup-to-disc ratio; HVF humphrey visual field; GVF goldmann visual field; OCT optical coherence tomography; IOP intraocular pressure; BRVO Branch retinal vein occlusion; CRVO central retinal vein occlusion; CRAO central retinal artery occlusion; BRAO branch retinal artery occlusion; RT retinal tear; SB scleral buckle; PPV pars plana vitrectomy; VH Vitreous hemorrhage; PRP panretinal laser photocoagulation; IVK intravitreal kenalog; VMT vitreomacular traction; MH Macular hole;  NVD neovascularization of the disc; NVE neovascularization elsewhere; AREDS age related eye disease study; ARMD age related macular degeneration; POAG primary open angle glaucoma; EBMD epithelial/anterior basement membrane dystrophy; ACIOL anterior chamber intraocular lens; IOL intraocular lens; PCIOL posterior chamber intraocular lens; Phaco/IOL phacoemulsification with intraocular lens placement; [place] photorefractive keratectomy; LASIK laser assisted in situ keratomileusis; HTN hypertension; DM diabetes mellitus; COPD chronic obstructive pulmonary disease

## 2019-11-01 ENCOUNTER — Other Ambulatory Visit: Payer: Self-pay

## 2019-11-01 ENCOUNTER — Encounter (INDEPENDENT_AMBULATORY_CARE_PROVIDER_SITE_OTHER): Payer: Self-pay | Admitting: Ophthalmology

## 2019-11-01 ENCOUNTER — Ambulatory Visit (INDEPENDENT_AMBULATORY_CARE_PROVIDER_SITE_OTHER): Payer: Medicare Other | Admitting: Ophthalmology

## 2019-11-01 VITALS — BP 204/91

## 2019-11-01 DIAGNOSIS — I10 Essential (primary) hypertension: Secondary | ICD-10-CM | POA: Diagnosis not present

## 2019-11-01 DIAGNOSIS — Z8669 Personal history of other diseases of the nervous system and sense organs: Secondary | ICD-10-CM

## 2019-11-01 DIAGNOSIS — H3581 Retinal edema: Secondary | ICD-10-CM | POA: Diagnosis not present

## 2019-11-01 DIAGNOSIS — Z961 Presence of intraocular lens: Secondary | ICD-10-CM

## 2019-11-01 DIAGNOSIS — E113313 Type 2 diabetes mellitus with moderate nonproliferative diabetic retinopathy with macular edema, bilateral: Secondary | ICD-10-CM

## 2019-11-01 DIAGNOSIS — H35033 Hypertensive retinopathy, bilateral: Secondary | ICD-10-CM | POA: Diagnosis not present

## 2019-11-04 ENCOUNTER — Encounter (INDEPENDENT_AMBULATORY_CARE_PROVIDER_SITE_OTHER): Payer: Self-pay | Admitting: Ophthalmology

## 2019-11-04 MED ORDER — AFLIBERCEPT 2MG/0.05ML IZ SOLN FOR KALEIDOSCOPE
2.0000 mg | INTRAVITREAL | Status: AC | PRN
  Administered 2019-11-04: 2 mg via INTRAVITREAL

## 2019-11-20 NOTE — Progress Notes (Signed)
[facility] Note  11/30/2019     CHIEF COMPLAINT Patient presents for Retina Follow Up   HISTORY OF PRESENT ILLNESS: Marcus Johnston is a 86 y.o. male who presents to the clinic today for:   HPI    Retina Follow Up    Patient presents with  Diabetic Retinopathy.  In both eyes.  This started weeks ago.  Severity is moderate.  Duration of weeks.  Since onset it is stable.  I, the attending physician,  performed the HPI with the patient and updated documentation appropriately.          Comments    Pt states vision is about the same OU.  Pt denies eye pain or discomfort and denies any new or worsening floaters or fol OU.       Last edited by [name], MD on 11/30/2019 12:13 PM. (History)    Patient states he stopped using PF and Ketorolac when the bottles ran out   Referring physician: Glendale Chard, [address] [place],  [place] [address]  HISTORICAL INFORMATION:   Selected notes from the MEDICAL RECORD NUMBER Referred by Dr. [name] for CME OU LEE: 12.01.20 (S. Groat) [BCVA: OD: 20/60-- OS: 20/150+] Ocular Hx-glc suspect (former pt of Dr. Sammie Bench / Dr. Venetia Maxon) PMH-A fib, DM (A1c: 7.9 [09.13.20] takes metformin .and humalog), prostate cancer, HTN, HLD   CURRENT MEDICATIONS: Current Outpatient Medications (Ophthalmic Drugs)  Medication Sig  . Bromfenac Sodium (PROLENSA) 0.07 % SOLN Place 1 drop into both eyes 4 (four) times daily.  Marland Kitchen ketorolac (ACULAR) 0.5 % ophthalmic solution Place 1 drop into both eyes 4 (four) times daily.   . prednisoLONE acetate (PRED FORTE) 1 % ophthalmic suspension Place 1 drop into both eyes 4 (four) times daily.   No current facility-administered medications for this visit. (Ophthalmic Drugs)   Current Outpatient Medications (Other)  Medication Sig  . Acetaminophen (TYLENOL PO) Take 2 tablets by mouth every 6 (six) hours as needed (pain/headache).  Marland Kitchen albuterol (PROVENTIL HFA;VENTOLIN  HFA) 108 (90 Base) MCG/ACT inhaler Inhale 2 puffs into the lungs every 4 (four) hours as needed for wheezing or shortness of breath (cough, shortness of breath or wheezing.).  Marland Kitchen carvedilol (COREG) 12.5 MG tablet Take 1 tablet (12.5 mg total) by mouth 2 (two) times daily with a meal.  . Cholecalciferol (VITAMIN D3) 250 MCG (10000 UT) TABS Take 1,000 Units by mouth daily.   Marland Kitchen doxazosin (CARDURA) 4 MG tablet TAKE 1 TABLET BY MOUTH AT  BEDTIME (Patient taking differently: Take 4 mg by mouth daily with supper. 1/2 tab daily)  . fexofenadine (ALLEGRA) 180 MG tablet Take 180 mg by mouth daily as needed (allergies.).   Marland Kitchen insulin lispro protamine-lispro (HUMALOG 50/50 MIX) (50-50) 100 UNIT/ML SUSP injection Inject 22-25 Units into the skin See admin instructions. Inject 25 units subcutaneously with breakfast and 22 units with supper  . Insulin Syringe-Needle U-100 (EASY TOUCH INSULIN SYRINGE) 30G X 5/16" 0.5 ML MISC USE 3 TIMES DAILY  . losartan-hydrochlorothiazide (HYZAAR) 100-25 MG tablet TAKE 1 TABLET BY MOUTH  DAILY (Patient taking differently: Take 1 tablet by mouth daily with breakfast. )  . metFORMIN (GLUCOPHAGE-XR) 500 MG 24 hr tablet Take 1,000 mg by mouth 2 (two) times daily with a meal.    No current facility-administered medications for this visit. (Other)      REVIEW OF SYSTEMS: ROS    Positive for: Neurological, Genitourinary, Endocrine, Cardiovascular, Eyes, Respiratory  Negative for: Constitutional, Gastrointestinal, Skin, Musculoskeletal, HENT, Psychiatric, Allergic/Imm, Heme/Lymph   Last edited by Doneen Poisson on 11/30/2019  9:12 AM. (History)       ALLERGIES Allergies  Allergen Reactions  . Bidil [Isosorb Dinitrate-Hydralazine] Other (See Comments)    Severe hypotension episode after administering one dose of Bidil 20-37.5 mg on 09/25/2019.   . Statins Other (See Comments)    Leg pain    PAST MEDICAL HISTORY Past Medical History:  Diagnosis Date  . Atrial fibrillation  ([place])   . Atypical atrial flutter ([place]) 10/17/2018  . Diabetes mellitus without complication ([place])   . Diabetic retinopathy ([place])    NPDR OU  . High cholesterol   . History of prostate cancer   . HTN (hypertension)   . Hypertensive retinopathy    OU  . Renal insufficiency   . Shingles    Past Surgical History:  Procedure Laterality Date  . ATRIAL TACH ABLATION N/A 05/08/2019   Procedure: ATRIAL TACH ABLATION;  Surgeon: [name], MD;  Location: [facility];  Service: Cardiovascular;  Laterality: N/A;  . CATARACT EXTRACTION Bilateral   . EYE SURGERY Bilateral    Cat Sx  . prostate implant      FAMILY HISTORY Family History  Problem Relation Age of Onset  . Cancer Mother   . Cancer Father     SOCIAL HISTORY Social History   Tobacco Use  . Smoking status: Never Smoker  . Smokeless tobacco: Never Used  Substance Use Topics  . Alcohol use: No  . Drug use: No         OPHTHALMIC EXAM:  Base Eye Exam    Visual Acuity (Snellen - Linear)      Right Left   Dist [place] 20/150 -1 20/150 -2   Dist ph [place] 20/40 +1 20/60  Only able to see one letter from 20/60 line OS (PH)       Tonometry (Tonopen, 9:17 AM)      Right Left   Pressure 13 16       Pupils      Dark Light Shape React APD   Right 3 2 Round Brisk 0   Left 3 2 Round Brisk 0       Visual Fields      Left Right    Full Full       Extraocular Movement      Right Left    Full Full       Neuro/Psych    Oriented x3: Yes   Mood/Affect: Normal       Dilation    Both eyes: 1.0% Mydriacyl, 2.5% Phenylephrine @ 9:17 AM        Slit Lamp and Fundus Exam    Slit Lamp Exam      Right Left   Lids/Lashes Dermatochalasis - upper lid, mild Meibomian gland dysfunction Dermatochalasis - upper lid, mild Meibomian gland dysfunction   Conjunctiva/Sclera Mild Melanosis Mild Melanosis   Cornea Trace Punctate epithelial erosions 1+ Punctate epithelial erosions. endopigment   Anterior Chamber Deep and  quiet Deep and quiet   Iris Round and dilated, No NVI Round and dilated, No NVI   Lens Toric PC IOL with marks at 1200 and 0600 PC IOL in good position with PC folds   Vitreous Vitreous syneresis, PVD and vitreous condensations Vitreous syneresis       Fundus Exam      Right Left   Disc Mild Pallor, mild, temporal Peripapillary  atrophy, Sharp rim temporal Peripapillary atrophy, +cupping, mild Pallor, Sharp rim   C/D Ratio 0.6 0.65   Macula Blunted foveal reflex, central IRH -- persistent, pigment clumping, Microaneurysms, +central cystic changes--slightly increased Blunted foveal reflex, Retinal pigment epithelial mottling and clumping, focal Exudates, +IRH, +central Cystic changes -- slightly improved, scattered Microaneurysms   Vessels Vascular attenuation, Tortuous Vascular attenuation, mild Copper wiring, AV crossing changes   Periphery Attached, large HST at 0500 with surrounding SRF, +pigmented barrier laser scar from 0430-0600    Attached, small pigmented operculum at 0730 midzone, no SRF           Refraction    Wearing Rx   Forgot to bring glasses today          IMAGING AND PROCEDURES  Imaging and Procedures for @TODAY @  OCT, Retina - OU - Both Eyes       Right Eye Quality was good. Central Foveal Thickness: 509. Progression has worsened. Findings include abnormal foveal contour, intraretinal fluid, no SRF (Mild interval increase in IRF; Trace ERM).   Left Eye Quality was good. Central Foveal Thickness: 317. Progression has been stable. Findings include intraretinal fluid, abnormal foveal contour, intraretinal hyper-reflective material, no SRF, outer retinal atrophy (Mild ERM; persistent IRF -- no significant improvement from prior).   Notes *Images captured and stored on drive  Diagnosis / Impression:  OD: abnormal foveal profile w/ +IRF -- macular edema (DME +/- CME component) -- mild interval increase in IRF; Trace ERM OS: abnormal foveal profile w/ +IRF/IRHM --  DME; no significant improvement from prior  Clinical management:  See below  Abbreviations: NFP - Normal foveal profile. CME - cystoid macular edema. PED - pigment epithelial detachment. IRF - intraretinal fluid. SRF - subretinal fluid. EZ - ellipsoid zone. ERM - epiretinal membrane. ORA - outer retinal atrophy. ORT - outer retinal tubulation. SRHM - subretinal hyper-reflective material        Intravitreal Injection, Pharmacologic Agent - OD - Right Eye       Time Out 11/30/2019. 9:38 AM. Confirmed correct patient, procedure, site, and patient consented.   Anesthesia Topical anesthesia was used. Anesthetic medications included Lidocaine 2%, Proparacaine 0.5%.   Procedure Preparation included 5% betadine to ocular surface, eyelid speculum. A 27 gauge needle was used.   Injection:  4 mg Triescence 40mg /mlL injection   NDC: [phone], Lot: [id number], Expiration date: 12/15/2019   Route: Intravitreal, Site: Right Eye, Waste: 0.9 mL  Post-op Post injection exam found visual acuity of at least counting fingers. The patient tolerated the procedure well. There were no complications. The patient received written and verbal post procedure care education.   Notes An AC tap was performed following injection due to elevated IOP using a 30 gauge needle on a syringe with the plunger removed. The needle was placed at the limbus at 7 oclock and approximately 0.09 cc of aqueous was removed from the anterior chamber. Betadine was applied to the tap area before and after the paracentesis was performed. There were no complications. The patient tolerated the procedure well. The IOP was rechecked and was found to be ~9 mmHg by palpation.        Intravitreal Injection, Pharmacologic Agent - OS - Left Eye       Time Out 11/30/2019. 9:39 AM. Confirmed correct patient, procedure, site, and patient consented.   Anesthesia Topical anesthesia was used. Anesthetic medications included Lidocaine 2%,  Proparacaine 0.5%.   Procedure Preparation included 5% betadine to ocular surface, eyelid speculum. A (32g)  needle was used.   Injection:  2 mg aflibercept Alfonse Flavors) SOLN   NDC: [id number], Lot: [id number], Expiration date: 04/12/2020   Route: Intravitreal, Site: Left Eye, Waste: 0.05 mL  Post-op Post injection exam found visual acuity of at least counting fingers. The patient tolerated the procedure well. There were no complications. The patient received written and verbal post procedure care education.                 ASSESSMENT/PLAN:    ICD-10-CM   1. Moderate nonproliferative diabetic retinopathy of both eyes with macular edema associated with type 2 diabetes mellitus (HCC)  [id number] Intravitreal Injection, Pharmacologic Agent - OD - Right Eye    Intravitreal Injection, Pharmacologic Agent - OS - Left Eye    triamcinolone acetonide (TRIESENCE) 40 MG/ML subtenons injection 4 mg    aflibercept (EYLEA) SOLN 2 mg  2. Retinal edema  H35.81 OCT, Retina - OU - Both Eyes  3. History of retinal detachment  Z86.69   4. Pseudophakia of both eyes  Z96.1   5. Hypertensive retinopathy of both eyes  H35.033   6. Essential hypertension  I10     1,2.  Non-proliferative diabetic retinopathy w/ macular edema OU  - patient with long standing history of macular edema -- was seen by Dr. Zigmund Daniel back in 2013  - has also seen Dr. Baird Cancer, Dr. Posey Pronto, [place] [facility]  - last retina f/u in 2019 with Dr. Posey Pronto and at [facility]  - pt has previously received intravitreal injections OU, but does not like them and felt like benefits from injections were minmal  - at initial visit, was using PF and ketorolac QID OU -- pt self d/c  - exam shows scattered/minimal MA but central edema OU  - FA (12.11.20) shows late leaking MA, OS shows capillary nonperfusion, OD with ?petaloid central hyperfluorescence -- ?CME component  - s/p IVA# 1 OD (12.11.20)  - s/p IVE #1 OD (01.20.21), #2 (02.17.21),  #3 (03.17.21)  - s/p IVE# 1 OS (12.11.20), #2 (01.20.21), #3 (02.17.21), #4 (03.17.21)  - BCVA OD: 20/40; OS: 20/60  - OCT shows mild interval increase IRF OD; persistent IRF OS   - Eylea4U benefits investigation initiated 12.11.20 -- approved for 2021  - ?resistance to IVE OD  - recommend IVTA OD #1 and IVE OS #5 today, 04.15.21  - pt wishes to proceed  - RBA of procedure discussed, questions answered  - informed consent obtained  - see procedure notes  - Eylea informed consent form signed and scanned on 01.20.21  - IVTA informed consent form signed and scanned on 04.15.20  - will re-start PF QID OU and add Prolensa QID OU -- sample given in office  - f/u in 4 wks -- DFE/OCT/possible injection  3. History of inferior HST w/ focal RD OD s/p laser retinopexy  - HST at 0500 w/ surrounding SRF  - good barricade laser in place spanning 0430-0600  - stable  4,5. Hypertensive retinopathy OU  - discussed importance of tight BP control  - monitor   6. Pseudophakia OU  - s/p CE/IOL OU  - beautiful surgeries, doing well  - monitor   Ophthalmic Meds Ordered this visit:  Meds ordered this encounter  Medications  . Bromfenac Sodium (PROLENSA) 0.07 % SOLN    Sig: Place 1 drop into both eyes 4 (four) times daily.    Dispense:  6 mL    Refill:  3  . prednisoLONE acetate (PRED FORTE) 1 %  ophthalmic suspension    Sig: Place 1 drop into both eyes 4 (four) times daily.    Dispense:  15 mL    Refill:  0  . triamcinolone acetonide (TRIESENCE) 40 MG/ML subtenons injection 4 mg  . aflibercept (EYLEA) SOLN 2 mg       Return in about 4 weeks (around 12/28/2019) for f/u NPDR OU, DFE, OCT.  There are no Patient Instructions on file for this visit.   Explained the diagnoses, plan, and follow up with the patient and they expressed understanding.  Patient expressed understanding of the importance of proper follow up care.   This document serves as a record of services personally performed by  Gardiner Sleeper, MD, PhD. It was created on their behalf by Leeann Must, [place], a certified ophthalmic assistant. The creation of this record is the provider's dictation and/or activities during the visit.    Electronically signed by: Leeann Must, COA @TODAY @ 12:21 PM   This document serves as a record of services personally performed by Gardiner Sleeper, MD, PhD. It was created on their behalf by Ernest Mallick, OA, an ophthalmic assistant. The creation of this record is the provider's dictation and/or activities during the visit.    Electronically signed by: Ernest Mallick, OA 04.15.2021 12:21 PM   Gardiner Sleeper, M.D., Ph.D. Diseases & Surgery of the Retina and Vitreous [facility]  I have reviewed the above documentation for accuracy and completeness, and I agree with the above. Gardiner Sleeper, M.D., Ph.D. 11/30/19 12:21 PM   Abbreviations: M myopia (nearsighted); A astigmatism; H hyperopia (farsighted); P presbyopia; Mrx spectacle prescription;  CTL contact lenses; OD right eye; OS left eye; OU both eyes  XT exotropia; ET esotropia; PEK punctate epithelial keratitis; PEE punctate epithelial erosions; DES dry eye syndrome; MGD meibomian gland dysfunction; ATs artificial tears; PFAT's preservative free artificial tears; [place] nuclear sclerotic cataract; PSC posterior subcapsular cataract; ERM epi-retinal membrane; PVD posterior vitreous detachment; RD retinal detachment; DM diabetes mellitus; DR diabetic retinopathy; NPDR non-proliferative diabetic retinopathy; PDR proliferative diabetic retinopathy; CSME clinically significant macular edema; DME diabetic macular edema; dbh dot blot hemorrhages; CWS cotton wool spot; POAG primary open angle glaucoma; C/D cup-to-disc ratio; HVF humphrey visual field; GVF goldmann visual field; OCT optical coherence tomography; IOP intraocular pressure; BRVO Branch retinal vein occlusion; CRVO central retinal vein occlusion; CRAO central  retinal artery occlusion; BRAO branch retinal artery occlusion; RT retinal tear; SB scleral buckle; PPV pars plana vitrectomy; VH Vitreous hemorrhage; PRP panretinal laser photocoagulation; IVK intravitreal kenalog; VMT vitreomacular traction; MH Macular hole;  NVD neovascularization of the disc; NVE neovascularization elsewhere; AREDS age related eye disease study; ARMD age related macular degeneration; POAG primary open angle glaucoma; EBMD epithelial/anterior basement membrane dystrophy; ACIOL anterior chamber intraocular lens; IOL intraocular lens; PCIOL posterior chamber intraocular lens; Phaco/IOL phacoemulsification with intraocular lens placement; [place] photorefractive keratectomy; LASIK laser assisted in situ keratomileusis; HTN hypertension; DM diabetes mellitus; COPD chronic obstructive pulmonary disease

## 2019-11-30 ENCOUNTER — Other Ambulatory Visit: Payer: Self-pay

## 2019-11-30 ENCOUNTER — Ambulatory Visit (INDEPENDENT_AMBULATORY_CARE_PROVIDER_SITE_OTHER): Payer: Medicare Other | Admitting: Ophthalmology

## 2019-11-30 ENCOUNTER — Encounter (INDEPENDENT_AMBULATORY_CARE_PROVIDER_SITE_OTHER): Payer: Self-pay | Admitting: Ophthalmology

## 2019-11-30 DIAGNOSIS — I10 Essential (primary) hypertension: Secondary | ICD-10-CM

## 2019-11-30 DIAGNOSIS — Z8669 Personal history of other diseases of the nervous system and sense organs: Secondary | ICD-10-CM

## 2019-11-30 DIAGNOSIS — E113313 Type 2 diabetes mellitus with moderate nonproliferative diabetic retinopathy with macular edema, bilateral: Secondary | ICD-10-CM

## 2019-11-30 DIAGNOSIS — H35033 Hypertensive retinopathy, bilateral: Secondary | ICD-10-CM | POA: Diagnosis not present

## 2019-11-30 DIAGNOSIS — H3581 Retinal edema: Secondary | ICD-10-CM | POA: Diagnosis not present

## 2019-11-30 DIAGNOSIS — Z961 Presence of intraocular lens: Secondary | ICD-10-CM | POA: Diagnosis not present

## 2019-11-30 MED ORDER — TRIAMCINOLONE ACETONIDE 40 MG/ML IO SUSP
4.0000 mg | INTRAOCULAR | Status: AC | PRN
  Administered 2019-11-30: 4 mg via INTRAVITREAL

## 2019-11-30 MED ORDER — PROLENSA 0.07 % OP SOLN: 1.0000 [drp] | Freq: Four times a day (QID) | OPHTHALMIC | 3 refills | Status: DC

## 2019-11-30 MED ORDER — AFLIBERCEPT 2MG/0.05ML IZ SOLN FOR KALEIDOSCOPE
2.0000 mg | INTRAVITREAL | Status: AC | PRN
  Administered 2019-11-30: 12:00:00 2 mg via INTRAVITREAL

## 2019-11-30 MED ORDER — PREDNISOLONE ACETATE 1 % OP SUSP: 1.0000 [drp] | Freq: Four times a day (QID) | OPHTHALMIC | 0 refills | Status: DC

## 2019-12-01 MED ORDER — PREDNISOLONE ACETATE 1 % OP SUSP: 1.0000 [drp] | Freq: Four times a day (QID) | OPHTHALMIC | 0 refills | Status: DC

## 2019-12-01 MED ORDER — PROLENSA 0.07 % OP SOLN: 1.0000 [drp] | Freq: Four times a day (QID) | OPHTHALMIC | 3 refills | Status: DC

## 2019-12-01 NOTE — Addendum Note (Signed)
Addended by: Gardiner Sleeper on: 12/01/2019 04:42 PM   Modules accepted: Orders

## 2019-12-04 ENCOUNTER — Other Ambulatory Visit: Payer: Self-pay

## 2019-12-04 ENCOUNTER — Encounter: Payer: Self-pay | Admitting: Podiatry

## 2019-12-04 ENCOUNTER — Ambulatory Visit: Payer: Medicare Other | Admitting: Cardiology

## 2019-12-04 ENCOUNTER — Ambulatory Visit: Payer: Medicare Other | Admitting: Podiatry

## 2019-12-04 VITALS — Temp 96.5°F

## 2019-12-04 DIAGNOSIS — B351 Tinea unguium: Secondary | ICD-10-CM

## 2019-12-04 DIAGNOSIS — M79676 Pain in unspecified toe(s): Secondary | ICD-10-CM

## 2019-12-04 NOTE — Patient Instructions (Signed)
Diabetes Mellitus and Foot Care Foot care is an important part of your health, especially when you have diabetes. Diabetes may cause you to have problems because of poor blood flow (circulation) to your feet and legs, which can cause your skin to:  Become thinner and drier.  Break more easily.  Heal more slowly.  Peel and crack. You may also have nerve damage (neuropathy) in your legs and feet, causing decreased feeling in them. This means that you may not notice minor injuries to your feet that could lead to more serious problems. Noticing and addressing any potential problems early is the best way to prevent future foot problems. How to care for your feet Foot hygiene  Wash your feet daily with warm water and mild soap. Do not use hot water. Then, pat your feet and the areas between your toes until they are completely dry. Do not soak your feet as this can dry your skin.  Trim your toenails straight across. Do not dig under them or around the cuticle. File the edges of your nails with an emery board or nail file.  Apply a moisturizing lotion or petroleum jelly to the skin on your feet and to dry, brittle toenails. Use lotion that does not contain alcohol and is unscented. Do not apply lotion between your toes. Shoes and socks  Wear clean socks or stockings every day. Make sure they are not too tight. Do not wear knee-high stockings since they may decrease blood flow to your legs.  Wear shoes that fit properly and have enough cushioning. Always look in your shoes before you put them on to be sure there are no objects inside.  To break in new shoes, wear them for just a few hours a day. This prevents injuries on your feet. Wounds, scrapes, corns, and calluses  Check your feet daily for blisters, cuts, bruises, sores, and redness. If you cannot see the bottom of your feet, use a mirror or ask someone for help.  Do not cut corns or calluses or try to remove them with medicine.  If you  find a minor scrape, cut, or break in the skin on your feet, keep it and the skin around it clean and dry. You may clean these areas with mild soap and water. Do not clean the area with peroxide, alcohol, or iodine.  If you have a wound, scrape, corn, or callus on your foot, look at it several times a day to make sure it is healing and not infected. Check for: ? Redness, swelling, or pain. ? Fluid or blood. ? Warmth. ? Pus or a bad smell. General instructions  Do not cross your legs. This may decrease blood flow to your feet.  Do not use heating pads or hot water bottles on your feet. They may burn your skin. If you have lost feeling in your feet or legs, you may not know this is happening until it is too late.  Protect your feet from hot and cold by wearing shoes, such as at the beach or on hot pavement.  Schedule a complete foot exam at least once a year (annually) or more often if you have foot problems. If you have foot problems, report any cuts, sores, or bruises to your health care provider immediately. Contact a health care provider if:  You have a medical condition that increases your risk of infection and you have any cuts, sores, or bruises on your feet.  You have an injury that is not   healing.  You have redness on your legs or feet.  You feel burning or tingling in your legs or feet.  You have pain or cramps in your legs and feet.  Your legs or feet are numb.  Your feet always feel cold.  You have pain around a toenail. Get help right away if:  You have a wound, scrape, corn, or callus on your foot and: ? You have pain, swelling, or redness that gets worse. ? You have fluid or blood coming from the wound, scrape, corn, or callus. ? Your wound, scrape, corn, or callus feels warm to the touch. ? You have pus or a bad smell coming from the wound, scrape, corn, or callus. ? You have a fever. ? You have a red line going up your leg. Summary  Check your feet every day  for cuts, sores, red spots, swelling, and blisters.  Moisturize feet and legs daily.  Wear shoes that fit properly and have enough cushioning.  If you have foot problems, report any cuts, sores, or bruises to your health care provider immediately.  Schedule a complete foot exam at least once a year (annually) or more often if you have foot problems. This information is not intended to replace advice given to you by your health care provider. Make sure you discuss any questions you have with your health care provider. Document Revised: 04/26/2019 Document Reviewed: 09/04/2016 Elsevier Patient Education  2020 Elsevier Inc.  Onychomycosis/Fungal Toenails  WHAT IS IT? An infection that lies within the keratin of your nail plate that is caused by a fungus.  WHY ME? Fungal infections affect all ages, sexes, races, and creeds.  There may be many factors that predispose you to a fungal infection such as age, coexisting medical conditions such as diabetes, or an autoimmune disease; stress, medications, fatigue, genetics, etc.  Bottom line: fungus thrives in a warm, moist environment and your shoes offer such a location.  IS IT CONTAGIOUS? Theoretically, yes.  You do not want to share shoes, nail clippers or files with someone who has fungal toenails.  Walking around barefoot in the same room or sleeping in the same bed is unlikely to transfer the organism.  It is important to realize, however, that fungus can spread easily from one nail to the next on the same foot.  HOW DO WE TREAT THIS?  There are several ways to treat this condition.  Treatment may depend on many factors such as age, medications, pregnancy, liver and kidney conditions, etc.  It is best to ask your doctor which options are available to you.  5. No treatment.   Unlike many other medical concerns, you can live with this condition.  However for many people this can be a painful condition and may lead to ingrown toenails or a bacterial  infection.  It is recommended that you keep the nails cut short to help reduce the amount of fungal nail. 6. Topical treatment.  These range from herbal remedies to prescription strength nail lacquers.  About 40-50% effective, topicals require twice daily application for approximately 9 to 12 months or until an entirely new nail has grown out.  The most effective topicals are medical grade medications available through physicians offices. 7. Oral antifungal medications.  With an 80-90% cure rate, the most common oral medication requires 3 to 4 months of therapy and stays in your system for a year as the new nail grows out.  Oral antifungal medications do require blood work to make   sure it is a safe drug for you.  A liver function panel will be performed prior to starting the medication and after the first month of treatment.  It is important to have the blood work performed to avoid any harmful side effects.  In general, this medication safe but blood work is required. 8. Laser Therapy.  This treatment is performed by applying a specialized laser to the affected nail plate.  This therapy is noninvasive, fast, and non-painful.  It is not covered by insurance and is therefore, out of pocket.  The results have been very good with a 80-95% cure rate.  The [facility] is the only practice in the area to offer this therapy. 9. Permanent Nail Avulsion.  Removing the entire nail so that a new nail will not grow back. 

## 2019-12-07 NOTE — Progress Notes (Signed)
Subjective: Marcus Johnston presents today for follow up of preventative diabetic foot care and painful mycotic nails b/l that are difficult to trim. Pain interferes with ambulation. Aggravating factors include wearing enclosed shoe gear. Pain is relieved with periodic professional debridement.   Allergies  Allergen Reactions  . Bidil [Isosorb Dinitrate-Hydralazine] Other (See Comments)    Severe hypotension episode after administering one dose of Bidil 20-37.5 mg on 09/25/2019.   . Statins Other (See Comments)    Leg pain     Objective: Vitals:   12/04/19 1359  Temp: (!) 96.5 F (35.8 C)    Pt is a pleasnt 86 y.o. year old AA male  in NAD. AAO x 3.   Vascular Examination:  Capillary refill time to digits immediate b/l. Palpable DP pulses b/l. Palpable PT pulses b/l. Pedal hair sparse b/l. Skin temperature gradient within normal limits b/l.  Dermatological Examination: Pedal skin with normal turgor, texture and tone bilaterally. No open wounds bilaterally. No interdigital macerations bilaterally. Toenails 1-5 b/l elongated, dystrophic, thickened, crumbly with subungual debris and tenderness to dorsal palpation.  Musculoskeletal: Normal muscle strength 5/5 to all lower extremity muscle groups bilaterally, no gross bony deformities bilaterally and no pain crepitus or joint limitation noted with ROM b/l  Neurological: Protective sensation intact 5/5 intact bilaterally with 10g monofilament b/l. Vibratory sensation intact b/l. Babinski reflex negative b/l. Clonus negative b/l.  Assessment: 1. Pain due to onychomycosis of toenail    Plan: -Continue diabetic foot care principles. Literature dispensed on today.  -Toenails 1-5 b/l were debrided in length and girth with sterile nail nippers and dremel without iatrogenic bleeding.  -Patient to continue soft, supportive shoe gear daily. -Patient to report any pedal injuries to medical professional immediately. -Patient/POA to call should  there be question/concern in the interim.  Return in about 3 months (around 03/04/2020) for diabetic nail trim.

## 2019-12-11 ENCOUNTER — Telehealth: Payer: Self-pay | Admitting: Internal Medicine

## 2019-12-11 NOTE — Chronic Care Management (AMB) (Signed)
  Chronic Care Management   Note  12/11/2019 Name: Marcus Johnston MRN: [id number] DOB: 1930-08-18  Marcus Johnston is a 86 y.o. year old male who is a primary care patient of Glendale Chard, MD. I reached out to [employer] by phone today in response to a referral sent by Marcus Johnston's health plan.     Marcus Johnston was given information about Chronic Care Management services today including:  1. CCM service includes personalized support from designated clinical staff supervised by his physician, including individualized plan of care and coordination with other care providers 2. 24/7 contact phone numbers for assistance for urgent and routine care needs. 3. Service will only be billed when office clinical staff spend 20 minutes or more in a month to coordinate care. 4. Only one practitioner may furnish and bill the service in a calendar month. 5. The patient may stop CCM services at any time (effective at the end of the month) by phone call to the office staff. 6. The patient will be responsible for cost sharing (co-pay) of up to 20% of the service fee (after annual deductible is met).  Patient agreed to services and verbal consent obtained.   Follow up plan: Telephone appointment with care management team member scheduled for:12/15/2019.  [place], [place] [address] Direct Dial: [phone] [email or web address]: [email or web address]

## 2019-12-15 ENCOUNTER — Other Ambulatory Visit: Payer: Self-pay

## 2019-12-15 ENCOUNTER — Ambulatory Visit: Payer: Medicare Other

## 2019-12-15 DIAGNOSIS — I10 Essential (primary) hypertension: Secondary | ICD-10-CM

## 2019-12-15 DIAGNOSIS — E1159 Type 2 diabetes mellitus with other circulatory complications: Secondary | ICD-10-CM

## 2019-12-15 DIAGNOSIS — E7800 Pure hypercholesterolemia, unspecified: Secondary | ICD-10-CM

## 2019-12-15 NOTE — Chronic Care Management (AMB) (Signed)
Chronic Care Management Pharmacy  Name: Marcus Johnston  MRN: [id number] DOB: [birth year]  Chief Complaint/ HPI  Marcus Johnston,  86 y.o. , male presents for his Initial CCM visit with the clinical pharmacist via telephone due to COVID-19 Pandemic.  PCP : Glendale Chard, MD  His chronic conditions include: Diabetes, hypertension, pure hypercholesterolemia  Office Visits: 2/24-3/2- Multiple telephone calls regarding doxazosin- Patient did not remember who prescribed this medication initially, but that it was a prostate specialist. Dr. Baird Cancer recommended patient cut dose in half (to 2 mg daily) because it might be contributing to dizziness.   07/06/19- OV- HTN evalutaion, encourage medication compliance, carvedilol recently increased by cardiologist, BP 162/64mmHg in office.  06/22/19- OV- BP 146/70mmHg, follow up after cardiology visit, encouraged to take new carvedilol dose that cardio prescribed.  Consult Visit: 12/04/19- Podiatry OV w/ Dr. Elisha Ponder for toenail debridement (pain due to onchomycosis of toenail), patient education on diabetic foot care given, follow up in 3 months for debridement.  11/30/19-Ophthalmology OV w/ Dr. Coralyn Pear- Presented for non-proliferative diabetic retinopathy w/ macular edema. Patient stopped prednisolone and ketorolac eye drops. Restart prednisolone eye drops and add Prolensa eye drops. Samples given in office. Eylea intravitreal injections administered. Follow up in 4 weeks.  11/01/19- Ophthalmology OV w/ Dr. Coralyn Pear- Presented for non-proliferative diabetic retinopathy w/ macular edema.Using ketorolac and prednisolone eye drops. Eylea intravitreal injections administered. Follow up in 4 weeks.  10/04/19- Ophthalmology OV w/ Dr. Coralyn Pear- Presented for non-proliferative diabetic retinopathy w/ macular edema.Using ketorolac and prednisolone eye drops. Eylea intravitreal injections administered. Follow up in 4 weeks.  09/25/19- ED visit- Syncope and collapse; no  acute abnormalities on MRI. Was given dose of Bidil in cardiologist office and 15 minutes later had syncopal event.   09/25/19- Cardiology OV w/ Dr. Virgina Jock- Patient's blood pressure was 206/135mmHg in office so patient was given 1 dose of Bidil 20-37.5mg . 15 minutes later patient was confused, pulse became thready and BP dropped to 68/60mmHg. Patient stopped breathing for a few seconds but then regained consciousness. BP increased to 200/100 again and then decreased to 140/100 5 minutes later. Sent to ED for further evaluation  08/28/19- Cardiology OV- BP 189/97 in office; patient instructed to check BP at home and bring log to next visit in 4 weeks  08/14/19- Cardiology OV- BP 204/98 and 184/81, restarted carvedilol (stopped previously because of dizziness)  08/09/19- Shingles ED visit  07/31/19- Endocrinology OV w/ Dr. Buddy Duty  07/28/19- Ophthalmology OV w/ Dr. Coralyn Pear- Presented for non-proliferative diabetic retinopathy w/ macular edema.Using ketorolac and prednisolone eye drops. Eylea intravitreal injections administered. Patient education on chronic nature of condition and need for close monitoring of blood pressure, blood sugar, and lipids. Follow up in 4 weeks.  06/19/19- Cardiology OV- Carvedilol increased to 12.5mg  twice daily due to uncontrolled blood pressure. Recommended close follow up with PCP.   Medications: No facility-administered encounter medications on file as of 12/15/2019.   Outpatient Encounter Medications as of 12/15/2019  Medication Sig  . Acetaminophen (TYLENOL PO) Take 2 tablets by mouth every 6 (six) hours as needed (pain/headache).  . Bromfenac Sodium (PROLENSA) 0.07 % SOLN Place 1 drop into both eyes 4 (four) times daily.  . carvedilol (COREG) 12.5 MG tablet Take 1 tablet (12.5 mg total) by mouth 2 (two) times daily with a meal.  . Cholecalciferol (VITAMIN D3) 25 MCG (1000 UT) CAPS Take 1,000 Units by mouth daily.   Marland Kitchen doxazosin (CARDURA) 4 MG tablet TAKE 1 TABLET BY  MOUTH AT  BEDTIME (Patient taking differently: Take 4 mg by mouth daily with supper. 1/2 tab daily)  . fexofenadine (ALLEGRA) 180 MG tablet Take 180 mg by mouth daily as needed (allergies.).   Marland Kitchen insulin lispro protamine-lispro (HUMALOG 50/50 MIX) (50-50) 100 UNIT/ML SUSP injection Inject 22-25 Units into the skin See admin instructions. Inject 25 units subcutaneously with breakfast and 22 units with supper  . Insulin Syringe-Needle U-100 (EASY TOUCH INSULIN SYRINGE) 30G X 5/16" 0.5 ML MISC USE 3 TIMES DAILY  . losartan-hydrochlorothiazide (HYZAAR) 100-25 MG tablet TAKE 1 TABLET BY MOUTH  DAILY (Patient taking differently: Take 1 tablet by mouth daily with breakfast. )  . metFORMIN (GLUCOPHAGE-XR) 500 MG 24 hr tablet Take 1,000 mg by mouth 2 (two) times daily with a meal.   . prednisoLONE acetate (PRED FORTE) 1 % ophthalmic suspension Place 1 drop into both eyes 4 (four) times daily.  Marland Kitchen albuterol (PROVENTIL HFA;VENTOLIN HFA) 108 (90 Base) MCG/ACT inhaler Inhale 2 puffs into the lungs every 4 (four) hours as needed for wheezing or shortness of breath (cough, shortness of breath or wheezing.). (Patient not taking: Reported on 12/17/2019)  . ketorolac (ACULAR) 0.5 % ophthalmic solution Place 1 drop into both eyes 4 (four) times daily.    Current Diagnosis/Assessment:  SDOH Interventions     Most Recent Value  SDOH Interventions  SDOH Interventions for the Following Domains  Financial Strain  Financial Strain Interventions  Other (Comment) [Patient assistance for Prolensa initiated]      Goals Addressed            This Visit's Progress   . Diabetes: Goal A1c <7%       CARE PLAN ENTRY  Current Barriers:  . Diabetes: Uncontrolled type 2; complicated by chronic medical conditions including kidney disease and hypertension Lab Results  Component Value Date   HGBA1C 7.9 (H) 04/30/2019 .   Lab Results  Component Value Date   CREATININE 1.24 09/25/2019   CREATININE 1.21 05/02/2019    CREATININE 1.43 (H) 04/30/2019   . Current antihyperglycemic regimen:  o Metformin XR 500mg  2 tablets twice daily o Humalog 50/50 Mix 25 units with breakfast, 22 units with supper . Denies hypoglycemic symptoms, including dizziness, lightheadedness, shaking, sweating . Denies hyperglycemic symptoms, including polyuria, polydipsia, polyphagia, nocturia, blurred vision, neuropathy . Current exercise: Mows lawn once a week, occasional walks . Current blood glucose readings: 192, 185, 179, 198, 180, 188, 179 . Cardiovascular risk reduction: o Current hypertensive regimen:  - Carvedilol 12.5mg  twice daily with a meal - Doxazosin 4mg  1/2 tablet daily (prostate) - Losartan/HCTZ 100-25mg  daily o Current hyperlipidemia regimen: N/A o Current antiplatelet regimen: N/A  Pharmacist Clinical Goal(s):  Marland Kitchen Over the next 6 weeks, patient will work with PharmD, endocrinologist, and primary care provider to address elevated blood sugars and A1c above goal.  Interventions: . Comprehensive medication review performed, medication list updated in electronic medical record  Patient Self Care Activities:  . Patient will check blood glucose twice daily over the next 6 weeks, document, and provide at future appointments . Patient will focus on medication adherence by continued use of weekly pill box over the next 6 weeks . Patient will increase exercise to 30 minutes 5 times weekly over the next 6 weeks . Patient will limit soda intake over the next 6 weeks . Patient will take medications as prescribed . Patient will contact provider with any episodes of hypoglycemia . Patient will report any questions or concerns to provider   Initial goal documentation     .  High Cholesterol Management       CARE PLAN ENTRY  Current Barriers:  . Controlled hyperlipidemia, complicated by diabetes . Current antihyperlipidemic regimen: N/A . Previous antihyperlipidemic medications: Patient has tried statins in the  past . Most recent lipid panel:  Lipid Panel     Component Value Date/Time   CHOL 158 03/23/2019 1339   TRIG 72 03/23/2019 1339   HDL 76 03/23/2019 1339   CHOLHDL 2.1 03/23/2019 1339   CHOLHDL 2.3 11/13/2016 0321   VLDL 11 11/13/2016 0321   LDLCALC 68 03/23/2019 1339   LABVLDL 14 03/23/2019 1339    . ASCVD risk enhancing conditions: age >70, DM, HTN, CKD  Pharmacist Clinical Goal(s):  Marland Kitchen Over the next 6 weeks patient will work with PharmD and providers towards optimized antihyperlipidemic therapy  Interventions: . Comprehensive medication review performed; medication list updated in electronic medical record.   Patient Self Care Activities:  . Patient will focus on heart healthy diet and 150 minutes of exercise per week over the next 6 weeks  Initial goal documentation     . Hypertension: Goal BP <140/90       CARE PLAN ENTRY  Current Barriers:  . Uncontrolled hypertension, complicated by diabetes and chronic kidney disease . Current antihypertensive regimen:  o Carvedilol 12.5mg  twice daily with a meal o Doxazosin 4mg  1/2 tablet daily (prostate) o Losartan/HCTZ 100-25mg  daily . Previous antihypertensives tried: amlodipine, diltiazem, hydralazine, metoprolol . Last practice recorded BP readings:  BP Readings from Last 3 Encounters:  12/17/19 (!) 186/91  11/01/19 (!) 204/91  09/25/19 (!) 162/84   . Current home BP readings: 157/105  Pharmacist Clinical Goal(s):  Marland Kitchen Over the next 6 weeks, patient will work with PharmD and providers to optimize antihypertensive regimen  Interventions: . Comprehensive medication review performed; medication list updated in the electronic medical record.   Patient Self Care Activities:  . Patient will continue to check BP daily and if symptomatic over the next 6 weeks, document, and provide at future appointments . Patient will focus on medication adherence by continued use of pillbox over the next 6 weeks  Initial goal documentation         Diabetes   Recent Relevant Labs: Lab Results  Component Value Date/Time   HGBA1C 7.9 (H) 04/30/2019 02:53 AM   HGBA1C 7.9 (H) 11/13/2016 03:21 AM   MICROALBUR 150 03/23/2019 04:38 PM     Kidney Function Lab Results  Component Value Date/Time   CREATININE 1.65 (H) 12/17/2019 09:36 PM   CREATININE 1.24 09/25/2019 02:02 PM   GFRNONAA 37 (L) 12/17/2019 09:36 PM   GFRAA 43 (L) 12/17/2019 09:36 PM  Stage 2 CKD  Checking BG: 2x per Day  Recent FBG Readings: 192 (4/30), 185 (4/29), 198 (4/28), 188 (4/27); 4 weeks ago: 183,172,142,152,118 Recent evening pre-meal BG readings: 179 (4/29), 180 (4/28), 179 (4/27); 4 weeks ago: 100,143,113,108,184  Patient has failed these meds in past: [place] Patient is currently uncontrolled on the following medications:  -Metformin XR 500mg  2 tablets twice daily -Humalog 50/50 Mix 25 units with breakfast, 22 units with supper  Last diabetic Foot exam: 08/28/19 Last diabetic Eye exam: 11/30/19  Diabetic retinopathy Currently treated with the following medications: -Prednisolone 1% eye drops 4 times daily -Prolensa eye drops 4 times daily -Eylea intravitreal injections   We discussed:  -Diet and exercise extensively  Limit snacks and reduce soda intake  Increase exercise to 30 minutes 5 days per week (Patient's  current exercise includes mowing lawn once a  week and  occasional walks) -Cost of Prolensa and difficulty using coupon provided by ophthalmologist at [facility] current medications  -Evaluate patient assistance options for Prolensa -Patient to follow up with endocrinology   Hypertension   Office blood pressures are  BP Readings from Last 3 Encounters:  12/17/19 (!) 186/91  11/01/19 (!) 204/91  09/25/19 (!) 162/84   Prior diagnosis of paroxysmal atrial tachycardia, s/p ablation 05/08/19  Patient has failed these meds in the past: Amlodipine, diltiazem, hydralazine, metoprolol tartrate  Patient checks BP at  home daily  Patient home BP readings are ranging: 157/105  Patient reports no current dizziness  Patient states he does not feel that he has white coat syndrome   Patient is currently uncontrolled on the following medications:  -Carvedilol 12.5mg  twice daily with a meal -Doxazosin 4mg  1/2 tablet daily (prostate) -Losartan/HCTZ 100-25mg  daily  We discussed: -Diet and exercise extensively  Plan -Continue current medications  -Heart healthy diet  Hyperlipidemia   Lipid Panel     Component Value Date/Time   CHOL 158 03/23/2019 1339   TRIG 72 03/23/2019 1339   HDL 76 03/23/2019 1339   CHOLHDL 2.1 03/23/2019 1339   CHOLHDL 2.3 11/13/2016 0321   VLDL 11 11/13/2016 0321   LDLCALC 68 03/23/2019 1339   LABVLDL 14 03/23/2019 1339     The ASCVD Risk score (Goff DC Jr., et al., 2013) failed to calculate for the following reasons:   The 2013 ASCVD risk score is only valid for ages 81 to 55   Patient has failed these meds in past: Statin allergy listed in chart  Patient is currently controlled on the following medications: N/A  We discussed:   -Diet and exercise extensively -The need for a statin if tolerated due to diabetes   Plan -Continue control with diet and exercise -Discuss history of statin allergy with PCP  Medication Management   Pt uses [facility] order pharmacy for all medications Uses pill box? Yes Pt endorses 100% compliance  We discussed:  -The importance of medication compliance  Plan -Continue current medication management strategy  Follow up: 6 weeks phone visit  Jannette Fogo, PharmD  Clinical Pharmacist [facility] [phone]

## 2019-12-17 ENCOUNTER — Observation Stay (HOSPITAL_COMMUNITY)
Admission: EM | Admit: 2019-12-17 | Discharge: 2019-12-20 | Disposition: A | Discharge status: Home / Self Care | Payer: Medicare Other | Attending: Internal Medicine | Admitting: Internal Medicine

## 2019-12-17 ENCOUNTER — Other Ambulatory Visit: Payer: Self-pay

## 2019-12-17 ENCOUNTER — Encounter (HOSPITAL_COMMUNITY): Payer: Self-pay | Admitting: Emergency Medicine

## 2019-12-17 DIAGNOSIS — I471 Supraventricular tachycardia: Secondary | ICD-10-CM | POA: Insufficient documentation

## 2019-12-17 DIAGNOSIS — E1122 Type 2 diabetes mellitus with diabetic chronic kidney disease: Secondary | ICD-10-CM | POA: Diagnosis not present

## 2019-12-17 DIAGNOSIS — N1831 Chronic kidney disease, stage 3a: Secondary | ICD-10-CM | POA: Diagnosis not present

## 2019-12-17 DIAGNOSIS — I169 Hypertensive crisis, unspecified: Secondary | ICD-10-CM | POA: Diagnosis not present

## 2019-12-17 DIAGNOSIS — I6381 Other cerebral infarction due to occlusion or stenosis of small artery: Secondary | ICD-10-CM | POA: Insufficient documentation

## 2019-12-17 DIAGNOSIS — E11319 Type 2 diabetes mellitus with unspecified diabetic retinopathy without macular edema: Secondary | ICD-10-CM | POA: Insufficient documentation

## 2019-12-17 DIAGNOSIS — Z79899 Other long term (current) drug therapy: Secondary | ICD-10-CM | POA: Diagnosis not present

## 2019-12-17 DIAGNOSIS — Z6830 Body mass index (BMI) 30.0-30.9, adult: Secondary | ICD-10-CM | POA: Insufficient documentation

## 2019-12-17 DIAGNOSIS — E7800 Pure hypercholesterolemia, unspecified: Secondary | ICD-10-CM | POA: Insufficient documentation

## 2019-12-17 DIAGNOSIS — R4701 Aphasia: Principal | ICD-10-CM | POA: Insufficient documentation

## 2019-12-17 DIAGNOSIS — Z888 Allergy status to other drugs, medicaments and biological substances status: Secondary | ICD-10-CM | POA: Diagnosis not present

## 2019-12-17 DIAGNOSIS — R29818 Other symptoms and signs involving the nervous system: Secondary | ICD-10-CM | POA: Diagnosis not present

## 2019-12-17 DIAGNOSIS — N179 Acute kidney failure, unspecified: Secondary | ICD-10-CM | POA: Diagnosis not present

## 2019-12-17 DIAGNOSIS — Z794 Long term (current) use of insulin: Secondary | ICD-10-CM | POA: Diagnosis not present

## 2019-12-17 DIAGNOSIS — I10 Essential (primary) hypertension: Secondary | ICD-10-CM | POA: Diagnosis present

## 2019-12-17 DIAGNOSIS — G459 Transient cerebral ischemic attack, unspecified: Secondary | ICD-10-CM | POA: Insufficient documentation

## 2019-12-17 DIAGNOSIS — E1159 Type 2 diabetes mellitus with other circulatory complications: Secondary | ICD-10-CM | POA: Insufficient documentation

## 2019-12-17 DIAGNOSIS — Z20822 Contact with and (suspected) exposure to covid-19: Secondary | ICD-10-CM | POA: Diagnosis not present

## 2019-12-17 DIAGNOSIS — E113299 Type 2 diabetes mellitus with mild nonproliferative diabetic retinopathy without macular edema, unspecified eye: Secondary | ICD-10-CM | POA: Insufficient documentation

## 2019-12-17 DIAGNOSIS — I1310 Hypertensive heart and chronic kidney disease without heart failure, with stage 1 through stage 4 chronic kidney disease, or unspecified chronic kidney disease: Secondary | ICD-10-CM | POA: Diagnosis not present

## 2019-12-17 DIAGNOSIS — Z66 Do not resuscitate: Secondary | ICD-10-CM | POA: Diagnosis not present

## 2019-12-17 DIAGNOSIS — E785 Hyperlipidemia, unspecified: Secondary | ICD-10-CM | POA: Diagnosis not present

## 2019-12-17 DIAGNOSIS — E119 Type 2 diabetes mellitus without complications: Secondary | ICD-10-CM

## 2019-12-17 DIAGNOSIS — Z8546 Personal history of malignant neoplasm of prostate: Secondary | ICD-10-CM | POA: Insufficient documentation

## 2019-12-17 DIAGNOSIS — Z03818 Encounter for observation for suspected exposure to other biological agents ruled out: Secondary | ICD-10-CM | POA: Diagnosis not present

## 2019-12-17 DIAGNOSIS — E669 Obesity, unspecified: Secondary | ICD-10-CM | POA: Diagnosis not present

## 2019-12-17 DIAGNOSIS — R5383 Other fatigue: Secondary | ICD-10-CM | POA: Insufficient documentation

## 2019-12-17 LAB — BASIC METABOLIC PANEL
Anion gap: 9 (ref 5–15)
BUN: 23 mg/dL (ref 8–23)
CO2: 23 mmol/L (ref 22–32)
Calcium: 9.2 mg/dL (ref 8.9–10.3)
Chloride: 107 mmol/L (ref 98–111)
Creatinine, Ser: 1.65 mg/dL — ABNORMAL HIGH (ref 0.61–1.24)
GFR calc Af Amer: 43 mL/min — ABNORMAL LOW (ref >60)
GFR calc non Af Amer: 37 mL/min — ABNORMAL LOW (ref >60)
Glucose, Bld: 97 mg/dL (ref 70–99)
Potassium: 4.3 mmol/L (ref 3.5–5.1)
Sodium: 139 mmol/L (ref 135–145)

## 2019-12-17 LAB — CBC
HCT: 46.7 % (ref 39.0–52.0)
Hemoglobin: 13.7 g/dL (ref 13.0–17.0)
MCH: 27.7 pg (ref 26.0–34.0)
MCHC: 29.3 g/dL — ABNORMAL LOW (ref 30.0–36.0)
MCV: 94.3 fL (ref 80.0–100.0)
Platelets: 202 10*3/uL (ref 150–400)
RBC: 4.95 MIL/uL (ref 4.22–5.81)
RDW: 15.3 % (ref 11.5–15.5)
WBC: 4.6 10*3/uL (ref 4.0–10.5)
nRBC: 0 % (ref 0.0–0.2)

## 2019-12-17 MED ORDER — SODIUM CHLORIDE 0.9% FLUSH: 3.0000 mL | Freq: Once | INTRAVENOUS | Status: DC

## 2019-12-17 NOTE — ED Triage Notes (Signed)
Pt states he started feeling very fatigue one hour ago. No fever, no chills. Neuro assessment completed during triage no neuro deficit noticed. Pt is AO x 4, able to speak in complete sentences, equal grip, able to stand up and walk with no unsteady gait.

## 2019-12-17 NOTE — Patient Instructions (Addendum)
Visit Information  Goals Addressed            This Visit's Progress   . Diabetes: Goal A1c <7%       CARE PLAN ENTRY  Current Barriers:  . Diabetes: Uncontrolled type 2; complicated by chronic medical conditions including kidney disease and hypertension Lab Results  Component Value Date   HGBA1C 7.9 (H) 04/30/2019 .   Lab Results  Component Value Date   CREATININE 1.24 09/25/2019   CREATININE 1.21 05/02/2019   CREATININE 1.43 (H) 04/30/2019   . Current antihyperglycemic regimen:  o Metformin XR 500mg  2 tablets twice daily o Humalog 50/50 Mix 25 units with breakfast, 22 units with supper . Denies hypoglycemic symptoms, including dizziness, lightheadedness, shaking, sweating . Denies hyperglycemic symptoms, including polyuria, polydipsia, polyphagia, nocturia, blurred vision, neuropathy . Current exercise: Mows lawn once a week, occasional walks . Current blood glucose readings: 192, 185, 179, 198, 180, 188, 179 . Cardiovascular risk reduction: o Current hypertensive regimen:  - Carvedilol 12.5mg  twice daily with a meal - Doxazosin 4mg  1/2 tablet daily (prostate) - Losartan/HCTZ 100-25mg  daily o Current hyperlipidemia regimen: N/A o Current antiplatelet regimen: N/A  Pharmacist Clinical Goal(s):  Marland Kitchen Over the next 6 weeks, patient will work with PharmD, endocrinologist, and primary care provider to address elevated blood sugars and A1c above goal.  Interventions: . Comprehensive medication review performed, medication list updated in electronic medical record  Patient Self Care Activities:  . Patient will check blood glucose twice daily over the next 6 weeks, document, and provide at future appointments . Patient will focus on medication adherence by continued use of weekly pill box over the next 6 weeks . Patient will increase exercise to 30 minutes 5 times weekly over the next 6 weeks . Patient will take medications as prescribed . Patient will contact provider with any  episodes of hypoglycemia . Patient will report any questions or concerns to provider   Initial goal documentation     . High Cholesterol Management       CARE PLAN ENTRY  Current Barriers:  . Controlled hyperlipidemia, complicated by diabetes . Current antihyperlipidemic regimen: N/A . Previous antihyperlipidemic medications: Patient has tried statins in the past . Most recent lipid panel:  Lipid Panel     Component Value Date/Time   CHOL 158 03/23/2019 1339   TRIG 72 03/23/2019 1339   HDL 76 03/23/2019 1339   CHOLHDL 2.1 03/23/2019 1339   CHOLHDL 2.3 11/13/2016 0321   VLDL 11 11/13/2016 0321   LDLCALC 68 03/23/2019 1339   LABVLDL 14 03/23/2019 1339    . ASCVD risk enhancing conditions: age >40, DM, HTN, CKD  Pharmacist Clinical Goal(s):  Marland Kitchen Over the next 6 weeks patient will work with PharmD and providers towards optimized antihyperlipidemic therapy  Interventions: . Comprehensive medication review performed; medication list updated in electronic medical record.   Patient Self Care Activities:  . Patient will focus on heart healthy diet and 150 minutes of exercise per week over the next 6 weeks  Initial goal documentation     . Hypertension: Goal BP <140/90       CARE PLAN ENTRY  Current Barriers:  . Uncontrolled hypertension, complicated by diabetes and chronic kidney disease . Current antihypertensive regimen:  o Carvedilol 12.5mg  twice daily with a meal o Doxazosin 4mg  1/2 tablet daily (prostate) o Losartan/HCTZ 100-25mg  daily . Previous antihypertensives tried: amlodipine, diltiazem, hydralazine, metoprolol . Last practice recorded BP readings:  BP Readings from Last 3 Encounters:  12/17/19 (!) 186/91  11/01/19 (!) 204/91  09/25/19 (!) 162/84   . Current home BP readings: 157/105  Pharmacist Clinical Goal(s):  Marland Kitchen Over the next 6 weeks, patient will work with PharmD and providers to optimize antihypertensive regimen  Interventions: . Comprehensive  medication review performed; medication list updated in the electronic medical record.   Patient Self Care Activities:  . Patient will continue to check BP daily and if symptomatic over the next 6 weeks, document, and provide at future appointments . Patient will focus on medication adherence by continued use of pillbox over the next 6 weeks  Initial goal documentation        Mr. Blend was given information about Chronic Care Management services today including:  1. CCM service includes personalized support from designated clinical staff supervised by his physician, including individualized plan of care and coordination with other care providers 2. 24/7 contact phone numbers for assistance for urgent and routine care needs. 3. Standard insurance, coinsurance, copays and deductibles apply for chronic care management only during months in which we provide at least 20 minutes of these services. Most insurances cover these services at 100%, however patients may be responsible for any copay, coinsurance and/or deductible if applicable. This service may help you avoid the need for more expensive face-to-face services. 4. Only one practitioner may furnish and bill the service in a calendar month. 5. The patient may stop CCM services at any time (effective at the end of the month) by phone call to the office staff.  Patient agreed to services and verbal consent obtained.   The patient verbalized understanding of instructions provided today and agreed to receive a mailed copy of patient instruction and/or educational materials. Telephone follow up appointment with pharmacy team member scheduled for: 02/25/20 @ 11:00AM  Jannette Fogo, PharmD Clinical Pharmacist [facility] [phone]   DASH Eating Plan DASH stands for "Dietary Approaches to Stop Hypertension." The DASH eating plan is a healthy eating plan that has been shown to reduce high blood pressure  (hypertension). It may also reduce your risk for type 2 diabetes, heart disease, and stroke. The DASH eating plan may also help with weight loss. What are tips for following this plan?  General guidelines  Avoid eating more than 2,300 mg (milligrams) of salt (sodium) a day. If you have hypertension, you may need to reduce your sodium intake to 1,500 mg a day.  Limit alcohol intake to no more than 1 drink a day for nonpregnant women and 2 drinks a day for men. One drink equals 12 oz of beer, 5 oz of wine, or 1 oz of hard liquor.  Work with your health care provider to maintain a healthy body weight or to lose weight. Ask what an ideal weight is for you.  Get at least 30 minutes of exercise that causes your heart to beat faster (aerobic exercise) most days of the week. Activities may include walking, swimming, or biking.  Work with your health care provider or diet and nutrition specialist (dietitian) to adjust your eating plan to your individual calorie needs. Reading food labels   Check food labels for the amount of sodium per serving. Choose foods with less than 5 percent of the Daily Value of sodium. Generally, foods with less than 300 mg of sodium per serving fit into this eating plan.  To find whole grains, look for the word "whole" as the first word in the ingredient list. Shopping  Buy products labeled as "low-sodium"  or "no salt added."  Buy fresh foods. Avoid canned foods and premade or frozen meals. Cooking  Avoid adding salt when cooking. Use salt-free seasonings or herbs instead of table salt or sea salt. Check with your health care provider or pharmacist before using salt substitutes.  Do not fry foods. Cook foods using healthy methods such as baking, boiling, grilling, and broiling instead.  Cook with heart-healthy oils, such as olive, canola, soybean, or sunflower oil. Meal planning  Eat a balanced diet that includes: ? 5 or more servings of fruits and vegetables  each day. At each meal, try to fill half of your plate with fruits and vegetables. ? Up to 6-8 servings of whole grains each day. ? Less than 6 oz of lean meat, poultry, or fish each day. A 3-oz serving of meat is about the same size as a deck of cards. One egg equals 1 oz. ? 2 servings of low-fat dairy each day. ? A serving of nuts, seeds, or beans 5 times each week. ? Heart-healthy fats. Healthy fats called Omega-3 fatty acids are found in foods such as flaxseeds and coldwater fish, like sardines, salmon, and mackerel.  Limit how much you eat of the following: ? Canned or prepackaged foods. ? Food that is high in trans fat, such as fried foods. ? Food that is high in saturated fat, such as fatty meat. ? Sweets, desserts, sugary drinks, and other foods with added sugar. ? Full-fat dairy products.  Do not salt foods before eating.  Try to eat at least 2 vegetarian meals each week.  Eat more home-cooked food and less restaurant, buffet, and fast food.  When eating at a restaurant, ask that your food be prepared with less salt or no salt, if possible. What foods are recommended? The items listed may not be a complete list. Talk with your dietitian about what dietary choices are best for you. Grains Whole-grain or whole-wheat bread. Whole-grain or whole-wheat pasta. Brown rice. [place]. Bulgur. Whole-grain and low-sodium cereals. Pita bread. Low-fat, low-sodium crackers. Whole-wheat flour tortillas. Vegetables Fresh or frozen vegetables (raw, steamed, roasted, or grilled). Low-sodium or reduced-sodium tomato and vegetable juice. Low-sodium or reduced-sodium tomato sauce and tomato paste. Low-sodium or reduced-sodium canned vegetables. Fruits All fresh, dried, or frozen fruit. Canned fruit in natural juice (without added sugar). Meat and other protein foods Skinless chicken or [place]. Ground chicken or [place]. Pork with fat trimmed off. Fish and seafood. Egg whites. Dried beans,  peas, or lentils. Unsalted nuts, nut butters, and seeds. Unsalted canned beans. Lean cuts of beef with fat trimmed off. Low-sodium, lean deli meat. Dairy Low-fat (1%) or fat-free (skim) milk. Fat-free, low-fat, or reduced-fat cheeses. Nonfat, low-sodium ricotta or cottage cheese. Low-fat or nonfat yogurt. Low-fat, low-sodium cheese. Fats and oils Soft margarine without trans fats. Vegetable oil. Low-fat, reduced-fat, or light mayonnaise and salad dressings (reduced-sodium). Canola, safflower, olive, soybean, and sunflower oils. Avocado. Seasoning and other foods Herbs. Spices. Seasoning mixes without salt. Unsalted popcorn and pretzels. Fat-free sweets. What foods are not recommended? The items listed may not be a complete list. Talk with your dietitian about what dietary choices are best for you. Grains Baked goods made with fat, such as croissants, muffins, or some breads. Dry pasta or rice meal packs. Vegetables Creamed or fried vegetables. Vegetables in a cheese sauce. Regular canned vegetables (not low-sodium or reduced-sodium). Regular canned tomato sauce and paste (not low-sodium or reduced-sodium). Regular tomato and vegetable juice (not low-sodium or reduced-sodium). Angie Fava. Olives.  Fruits Canned fruit in a light or heavy syrup. Fried fruit. Fruit in cream or butter sauce. Meat and other protein foods Fatty cuts of meat. Ribs. Fried meat. Berniece Salines. Sausage. Bologna and other processed lunch meats. Salami. Fatback. Hotdogs. Bratwurst. Salted nuts and seeds. Canned beans with added salt. Canned or smoked fish. Whole eggs or egg yolks. Chicken or [place] with skin. Dairy Whole or 2% milk, cream, and half-and-half. Whole or full-fat cream cheese. Whole-fat or sweetened yogurt. Full-fat cheese. Nondairy creamers. Whipped toppings. Processed cheese and cheese spreads. Fats and oils Butter. Stick margarine. Lard. Shortening. Ghee. Bacon fat. Tropical oils, such as coconut, palm kernel, or palm  oil. Seasoning and other foods Salted popcorn and pretzels. Onion salt, garlic salt, seasoned salt, table salt, and sea salt. Worcestershire sauce. Tartar sauce. Barbecue sauce. Teriyaki sauce. Soy sauce, including reduced-sodium. Steak sauce. Canned and packaged gravies. Fish sauce. Oyster sauce. Cocktail sauce. Horseradish that you find on the shelf. Ketchup. Mustard. Meat flavorings and tenderizers. Bouillon cubes. Hot sauce and Tabasco sauce. Premade or packaged marinades. Premade or packaged taco seasonings. Relishes. Regular salad dressings. Where to find more information:  National Heart, Lung, and [place]: https://wilson-eaton.com/  American Heart Association: www.heart.org Summary  The DASH eating plan is a healthy eating plan that has been shown to reduce high blood pressure (hypertension). It may also reduce your risk for type 2 diabetes, heart disease, and stroke.  With the DASH eating plan, you should limit salt (sodium) intake to 2,300 mg a day. If you have hypertension, you may need to reduce your sodium intake to 1,500 mg a day.  When on the DASH eating plan, aim to eat more fresh fruits and vegetables, whole grains, lean proteins, low-fat dairy, and heart-healthy fats.  Work with your health care provider or diet and nutrition specialist (dietitian) to adjust your eating plan to your individual calorie needs. This information is not intended to replace advice given to you by your health care provider. Make sure you discuss any questions you have with your health care provider. Document Revised: 07/16/2017 Document Reviewed: 07/27/2016 Elsevier Patient Education  2020 [employer].  Diabetes Mellitus and Exercise Exercising regularly is important for your overall health, especially when you have diabetes (diabetes mellitus). Exercising is not only about losing weight. It has many other health benefits, such as increasing muscle strength and bone density and reducing body fat  and stress. This leads to improved fitness, flexibility, and endurance, all of which result in better overall health. Exercise has additional benefits for people with diabetes, including:  Reducing appetite.  Helping to lower and control blood glucose.  Lowering blood pressure.  Helping to control amounts of fatty substances (lipids) in the blood, such as cholesterol and triglycerides.  Helping the body to respond better to insulin (improving insulin sensitivity).  Reducing how much insulin the body needs.  Decreasing the risk for heart disease by: ? Lowering cholesterol and triglyceride levels. ? Increasing the levels of good cholesterol. ? Lowering blood glucose levels. What is my activity plan? Your health care provider or certified diabetes educator can help you make a plan for the type and frequency of exercise (activity plan) that works for you. Make sure that you:  Do at least 150 minutes of moderate-intensity or vigorous-intensity exercise each week. This could be brisk walking, biking, or water aerobics. ? Do stretching and strength exercises, such as yoga or weightlifting, at least 2 times a week. ? Spread out your activity over at least  3 days of the week.  Get some form of physical activity every day. ? Do not go more than 2 days in a row without some kind of physical activity. ? Avoid being inactive for more than 30 minutes at a time. Take frequent breaks to walk or stretch.  Choose a type of exercise or activity that you enjoy, and set realistic goals.  Start slowly, and gradually increase the intensity of your exercise over time. What do I need to know about managing my diabetes?   Check your blood glucose before and after exercising. ? If your blood glucose is 240 mg/dL (13.3 mmol/L) or higher before you exercise, check your urine for ketones. If you have ketones in your urine, do not exercise until your blood glucose returns to normal. ? If your blood glucose is  100 mg/dL (5.6 mmol/L) or lower, eat a snack containing 15-20 grams of carbohydrate. Check your blood glucose 15 minutes after the snack to make sure that your level is above 100 mg/dL (5.6 mmol/L) before you start your exercise.  Know the symptoms of low blood glucose (hypoglycemia) and how to treat it. Your risk for hypoglycemia increases during and after exercise. Common symptoms of hypoglycemia can include: ? Hunger. ? Anxiety. ? Sweating and feeling clammy. ? Confusion. ? Dizziness or feeling light-headed. ? Increased heart rate or palpitations. ? Blurry vision. ? Tingling or numbness around the mouth, lips, or tongue. ? Tremors or shakes. ? Irritability.  Keep a rapid-acting carbohydrate snack available before, during, and after exercise to help prevent or treat hypoglycemia.  Avoid injecting insulin into areas of the body that are going to be exercised. For example, avoid injecting insulin into: ? The arms, when playing tennis. ? The legs, when jogging.  Keep records of your exercise habits. Doing this can help you and your health care provider adjust your diabetes management plan as needed. Write down: ? Food that you eat before and after you exercise. ? Blood glucose levels before and after you exercise. ? The type and amount of exercise you have done. ? When your insulin is expected to peak, if you use insulin. Avoid exercising at times when your insulin is peaking.  When you start a new exercise or activity, work with your health care provider to make sure the activity is safe for you, and to adjust your insulin, medicines, or food intake as needed.  Drink plenty of water while you exercise to prevent dehydration or heat stroke. Drink enough fluid to keep your urine clear or pale yellow. Summary  Exercising regularly is important for your overall health, especially when you have diabetes (diabetes mellitus).  Exercising has many health benefits, such as increasing muscle  strength and bone density and reducing body fat and stress.  Your health care provider or certified diabetes educator can help you make a plan for the type and frequency of exercise (activity plan) that works for you.  When you start a new exercise or activity, work with your health care provider to make sure the activity is safe for you, and to adjust your insulin, medicines, or food intake as needed. This information is not intended to replace advice given to you by your health care provider. Make sure you discuss any questions you have with your health care provider. Document Revised: 02/25/2017 Document Reviewed: 01/13/2016 Elsevier Patient Education  [place].

## 2019-12-18 ENCOUNTER — Other Ambulatory Visit (HOSPITAL_COMMUNITY): Payer: Medicare Other

## 2019-12-18 ENCOUNTER — Ambulatory Visit: Payer: Self-pay

## 2019-12-18 ENCOUNTER — Observation Stay (HOSPITAL_BASED_OUTPATIENT_CLINIC_OR_DEPARTMENT_OTHER): Payer: Medicare Other

## 2019-12-18 ENCOUNTER — Emergency Department (HOSPITAL_COMMUNITY): Payer: Medicare Other

## 2019-12-18 ENCOUNTER — Encounter (HOSPITAL_COMMUNITY): Payer: Self-pay | Admitting: Internal Medicine

## 2019-12-18 DIAGNOSIS — I169 Hypertensive crisis, unspecified: Secondary | ICD-10-CM

## 2019-12-18 DIAGNOSIS — G459 Transient cerebral ischemic attack, unspecified: Secondary | ICD-10-CM

## 2019-12-18 DIAGNOSIS — R29818 Other symptoms and signs involving the nervous system: Secondary | ICD-10-CM | POA: Diagnosis not present

## 2019-12-18 DIAGNOSIS — I10 Essential (primary) hypertension: Secondary | ICD-10-CM

## 2019-12-18 DIAGNOSIS — E1159 Type 2 diabetes mellitus with other circulatory complications: Secondary | ICD-10-CM

## 2019-12-18 LAB — HEMOGLOBIN A1C
Hgb A1c MFr Bld: 8 % — ABNORMAL HIGH (ref 4.8–5.6)
Mean Plasma Glucose: 182.9 mg/dL

## 2019-12-18 LAB — URINALYSIS, ROUTINE W REFLEX MICROSCOPIC
Bilirubin Urine: NEGATIVE
Glucose, UA: NEGATIVE mg/dL
Ketones, ur: 5 mg/dL — AB
Leukocytes,Ua: NEGATIVE
Nitrite: NEGATIVE
Protein, ur: 100 mg/dL — AB
Specific Gravity, Urine: 1.019 (ref 1.005–1.030)
pH: 5 (ref 5.0–8.0)

## 2019-12-18 LAB — HEPATIC FUNCTION PANEL
ALT: 12 U/L (ref 0–44)
AST: 16 U/L (ref 15–41)
Albumin: 3.6 g/dL (ref 3.5–5.0)
Alkaline Phosphatase: 67 U/L (ref 38–126)
Bilirubin, Direct: <0.1 mg/dL (ref 0.0–0.2)
Total Bilirubin: 0.6 mg/dL (ref 0.3–1.2)
Total Protein: 6.6 g/dL (ref 6.5–8.1)

## 2019-12-18 LAB — RESPIRATORY PANEL BY RT PCR (FLU A&B, COVID)
Influenza A by PCR: NEGATIVE
Influenza B by PCR: NEGATIVE
SARS Coronavirus 2 by RT PCR: NEGATIVE

## 2019-12-18 LAB — ECHOCARDIOGRAM COMPLETE
Height: 75 in
Weight: 3841.3 oz

## 2019-12-18 LAB — GLUCOSE, CAPILLARY: Glucose-Capillary: 250 mg/dL — ABNORMAL HIGH (ref 70–99)

## 2019-12-18 LAB — CBG MONITORING, ED
Glucose-Capillary: 118 mg/dL — ABNORMAL HIGH (ref 70–99)
Glucose-Capillary: 130 mg/dL — ABNORMAL HIGH (ref 70–99)
Glucose-Capillary: 220 mg/dL — ABNORMAL HIGH (ref 70–99)

## 2019-12-18 LAB — TROPONIN I (HIGH SENSITIVITY)
Troponin I (High Sensitivity): 6 ng/L (ref <18)
Troponin I (High Sensitivity): 5 ng/L (ref <18)

## 2019-12-18 MED ORDER — CARVEDILOL 12.5 MG PO TABS
12.5000 mg | ORAL_TABLET | Freq: Two times a day (BID) | ORAL | Status: DC
  Administered 2019-12-18 – 2019-12-20 (×4): 12.5 mg via ORAL
  Filled 2019-12-18 (×3): qty 1

## 2019-12-18 MED ORDER — HYDRALAZINE HCL 20 MG/ML IJ SOLN
5.0000 mg | INTRAMUSCULAR | Status: DC | PRN
  Administered 2019-12-18: 5 mg via INTRAVENOUS
  Filled 2019-12-18: qty 1

## 2019-12-18 MED ORDER — ACETAMINOPHEN 325 MG PO TABS: 650.0000 mg | ORAL_TABLET | Freq: Four times a day (QID) | ORAL | Status: DC | PRN

## 2019-12-18 MED ORDER — INSULIN ASPART 100 UNIT/ML ~~LOC~~ SOLN
0.0000 [IU] | Freq: Three times a day (TID) | SUBCUTANEOUS | Status: DC
  Administered 2019-12-18: 2 [IU] via SUBCUTANEOUS
  Administered 2019-12-18: 5 [IU] via SUBCUTANEOUS
  Administered 2019-12-19: 3 [IU] via SUBCUTANEOUS
  Administered 2019-12-19: 5 [IU] via SUBCUTANEOUS
  Administered 2019-12-19 – 2019-12-20 (×2): 3 [IU] via SUBCUTANEOUS

## 2019-12-18 MED ORDER — DOXAZOSIN MESYLATE 2 MG PO TABS
2.0000 mg | ORAL_TABLET | Freq: Every day | ORAL | Status: DC
  Administered 2019-12-18 – 2019-12-19 (×2): 2 mg via ORAL
  Filled 2019-12-18 (×3): qty 1

## 2019-12-18 MED ORDER — CARVEDILOL 12.5 MG PO TABS
12.5000 mg | ORAL_TABLET | Freq: Two times a day (BID) | ORAL | Status: DC
  Administered 2019-12-18: 12.5 mg via ORAL
  Filled 2019-12-18 (×2): qty 1

## 2019-12-18 MED ORDER — ASPIRIN 300 MG RE SUPP: 300.0000 mg | Freq: Every day | RECTAL | Status: DC

## 2019-12-18 MED ORDER — ONDANSETRON HCL 4 MG PO TABS: 4.0000 mg | ORAL_TABLET | Freq: Four times a day (QID) | ORAL | Status: DC | PRN

## 2019-12-18 MED ORDER — BISACODYL 5 MG PO TBEC
5.0000 mg | DELAYED_RELEASE_TABLET | Freq: Every day | ORAL | Status: DC | PRN
  Administered 2019-12-18: 5 mg via ORAL
  Filled 2019-12-18: qty 1

## 2019-12-18 MED ORDER — ONDANSETRON HCL 4 MG/2ML IJ SOLN: 4.0000 mg | Freq: Four times a day (QID) | INTRAMUSCULAR | Status: DC | PRN

## 2019-12-18 MED ORDER — SODIUM CHLORIDE 0.9 % IV SOLN
INTRAVENOUS | Status: DC
  Administered 2019-12-18: 1000 mL via INTRAVENOUS

## 2019-12-18 MED ORDER — KETOROLAC TROMETHAMINE 0.5 % OP SOLN
1.0000 [drp] | Freq: Four times a day (QID) | OPHTHALMIC | Status: DC
  Administered 2019-12-18 – 2019-12-20 (×8): 1 [drp] via OPHTHALMIC
  Filled 2019-12-18: qty 5

## 2019-12-18 MED ORDER — HYDROCHLOROTHIAZIDE 25 MG PO TABS
25.0000 mg | ORAL_TABLET | Freq: Every day | ORAL | Status: DC
  Filled 2019-12-18 (×5): qty 1

## 2019-12-18 MED ORDER — INSULIN ASPART 100 UNIT/ML ~~LOC~~ SOLN
0.0000 [IU] | Freq: Every day | SUBCUTANEOUS | Status: DC
  Administered 2019-12-18 – 2019-12-19 (×2): 2 [IU] via SUBCUTANEOUS

## 2019-12-18 MED ORDER — ASPIRIN 325 MG PO TABS
325.0000 mg | ORAL_TABLET | Freq: Every day | ORAL | Status: DC
  Administered 2019-12-18 – 2019-12-19 (×2): 325 mg via ORAL
  Filled 2019-12-18 (×3): qty 1

## 2019-12-18 MED ORDER — POLYETHYLENE GLYCOL 3350 17 G PO PACK: 17.0000 g | PACK | Freq: Every day | ORAL | Status: DC | PRN

## 2019-12-18 MED ORDER — LOSARTAN POTASSIUM 50 MG PO TABS
100.0000 mg | ORAL_TABLET | Freq: Every day | ORAL | Status: DC
  Administered 2019-12-18 – 2019-12-20 (×3): 100 mg via ORAL
  Filled 2019-12-18 (×4): qty 2

## 2019-12-18 MED ORDER — MORPHINE SULFATE (PF) 2 MG/ML IV SOLN: 2.0000 mg | INTRAVENOUS | Status: DC | PRN

## 2019-12-18 MED ORDER — ENOXAPARIN SODIUM 40 MG/0.4ML ~~LOC~~ SOLN
40.0000 mg | SUBCUTANEOUS | Status: DC
  Administered 2019-12-18 – 2019-12-19 (×2): 40 mg via SUBCUTANEOUS
  Filled 2019-12-18 (×2): qty 0.4

## 2019-12-18 MED ORDER — DOCUSATE SODIUM 100 MG PO CAPS
100.0000 mg | ORAL_CAPSULE | Freq: Two times a day (BID) | ORAL | Status: DC
  Administered 2019-12-18 – 2019-12-19 (×4): 100 mg via ORAL
  Filled 2019-12-18 (×4): qty 1

## 2019-12-18 MED ORDER — ACETAMINOPHEN 650 MG RE SUPP: 650.0000 mg | Freq: Four times a day (QID) | RECTAL | Status: DC | PRN

## 2019-12-18 MED ORDER — STROKE: EARLY STAGES OF RECOVERY BOOK
Freq: Once | Status: DC
  Filled 2019-12-18 (×2): qty 1

## 2019-12-18 MED ORDER — PREDNISOLONE ACETATE 1 % OP SUSP
1.0000 [drp] | Freq: Four times a day (QID) | OPHTHALMIC | Status: DC
  Administered 2019-12-18 – 2019-12-20 (×8): 1 [drp] via OPHTHALMIC
  Filled 2019-12-18: qty 5

## 2019-12-18 MED ORDER — HYDROCODONE-ACETAMINOPHEN 5-325 MG PO TABS: 1.0000 | ORAL_TABLET | ORAL | Status: DC | PRN

## 2019-12-18 MED ORDER — ZOLPIDEM TARTRATE 5 MG PO TABS: 5.0000 mg | ORAL_TABLET | Freq: Every evening | ORAL | Status: DC | PRN

## 2019-12-18 MED ORDER — LOSARTAN POTASSIUM-HCTZ 100-25 MG PO TABS: 1.0000 | ORAL_TABLET | Freq: Every day | ORAL | Status: DC

## 2019-12-18 NOTE — ED Provider Notes (Signed)
[facility] EMERGENCY DEPARTMENT Provider Note   CSN: [id number] Arrival date & time: 12/17/19  2103     History Chief Complaint  Patient presents with  . Fatigue    Marcus Johnston is a 86 y.o. male presenting for evaluation of speech difficulty, fatigue, and confusion.   Pt states last night around 8:30 PM he had a 5 minute episode of aphasia, where he couldn't get words out, but was able to think of them. He reports associated "weakness in his abdomen." pt's wife states pt was confused for many minutes following (wife states pt couldn't find his coat, right in front of him). Since presenting to the ER, he has been sx free. He denies recent fever, chills, CP, cough, SOB, n/v, and pain. He reports urinary frequency, but no dysuria or hematuria.  States his BGL has been well controlled recently. He has a h/o HTN, DM.   Additional history came for chart review.  Patient with a history of atrial fib/tachycardia status post ablation.  He is not on anticoagulation.  HPI     Past Medical History:  Diagnosis Date  . Atrial fibrillation ([place])   . Atypical atrial flutter ([place]) 10/17/2018  . Diabetes mellitus without complication ([place])   . Diabetic retinopathy ([place])    NPDR OU  . High cholesterol   . History of prostate cancer   . HTN (hypertension)   . Hypertensive retinopathy    OU  . Renal insufficiency   . Shingles     Patient Active Problem List   Diagnosis Date Noted  . Diabetic retinopathy ([place])   . Hypotension due to drugs 09/26/2019  . Groin swelling 05/17/2019  . Bruit (arterial) 05/17/2019  . SVT (supraventricular tachycardia) ([place]) 05/08/2019  . Atrial tachycardia ([place]) 05/02/2019  . Bradycardia 04/30/2019  . Syncope 04/30/2019  . Sick sinus syndrome ([place]) 04/30/2019  . Syncope and collapse 04/29/2019  . Leg edema 10/17/2018  . Type 2 diabetes mellitus with other circulatory complications ([place]) 0000000  . Hyperglycemia   . history of TIA  (transient ischemic attack) 11/12/2016  . White coat syndrome with hypertension 11/12/2016  . DM (diabetes mellitus) ([place]) 11/12/2016  . Renal insufficiency 11/12/2016    Past Surgical History:  Procedure Laterality Date  . ATRIAL TACH ABLATION N/A 05/08/2019   Procedure: ATRIAL TACH ABLATION;  Surgeon: [name], MD;  Location: [facility];  Service: Cardiovascular;  Laterality: N/A;  . CATARACT EXTRACTION Bilateral   . EYE SURGERY Bilateral    Cat Sx  . prostate implant         Family History  Problem Relation Age of Onset  . Cancer Mother   . Cancer Father     Social History   Tobacco Use  . Smoking status: Never Smoker  . Smokeless tobacco: Never Used  Substance Use Topics  . Alcohol use: No  . Drug use: No    Home Medications Prior to Admission medications   Medication Sig Start Date End Date Taking? Authorizing Provider  acetaminophen (TYLENOL) 325 MG tablet Take 650 mg by mouth every 6 (six) hours as needed for mild pain or fever.   Yes [provider]  Bromfenac Sodium (PROLENSA) 0.07 % SOLN Place 1 drop into both eyes 4 (four) times daily. 12/01/19  Yes [name], MD  carvedilol (COREG) 12.5 MG tablet Take 1 tablet (12.5 mg total) by mouth 2 (two) times daily with a meal. 09/18/19  Yes Patwardhan, Reynold Bowen, MD  Cholecalciferol (VITAMIN D3) 25 MCG (1000 UT) CAPS Take 1,000 Units by mouth daily.    Yes [provider]  doxazosin (CARDURA) 4 MG tablet TAKE 1 TABLET BY MOUTH AT  BEDTIME Patient taking differently: Take 2 mg by mouth daily with supper.  06/16/19  Yes Glendale Chard, MD  fexofenadine (ALLEGRA) 180 MG tablet Take 180 mg by mouth daily as needed (allergies.).    Yes [provider]  insulin lispro protamine-lispro (HUMALOG 50/50 MIX) (50-50) 100 UNIT/ML SUSP injection Inject 22-25 Units into the skin See admin instructions. Inject 25 units subcutaneously with breakfast and 22 units with supper   Yes [provider]  losartan-hydrochlorothiazide (HYZAAR) 100-25 MG tablet TAKE 1 TABLET BY MOUTH  DAILY Patient taking differently: Take 1 tablet by mouth daily with breakfast.  04/10/19  Yes Glendale Chard, MD  metFORMIN (GLUCOPHAGE-XR) 500 MG 24 hr tablet Take 1,000 mg by mouth 2 (two) times daily with a meal.    Yes [provider]  prednisoLONE acetate (PRED FORTE) 1 % ophthalmic suspension Place 1 drop into both eyes 4 (four) times daily. 12/01/19  Yes [name], MD  albuterol (PROVENTIL HFA;VENTOLIN HFA) 108 (90 Base) MCG/ACT inhaler Inhale 2 puffs into the lungs every 4 (four) hours as needed for wheezing or shortness of breath (cough, shortness of breath or wheezing.). Patient not taking: Reported on 12/17/2019 08/04/18   Robyn Haber, MD  Insulin Syringe-Needle U-100 (EASY TOUCH INSULIN SYRINGE) 30G X 5/16" 0.5 ML MISC USE 3 TIMES DAILY 09/20/19   Glendale Chard, MD    Allergies    Bidil [isosorb dinitrate-hydralazine] and Statins  Review of Systems   Review of Systems  Neurological: Positive for speech difficulty (resolved).  Psychiatric/Behavioral: Positive for confusion (resolved).  All other systems reviewed and are negative.   Physical Exam Updated Vital Signs BP (!) 186/91 (BP Location: Right Arm)   Pulse 68   Temp 98.3 F (36.8 C) (Oral)   Resp 19   Ht 6\' 3"  (1.905 m)   Wt 108.9 kg   SpO2 94%   BMI 30.01 kg/m   Physical Exam Vitals and nursing note reviewed.  Constitutional:      General: He is not in acute distress.    Appearance: He is well-developed.     Comments: Elderly male who appears nontoxic  HENT:     Head: Normocephalic and atraumatic.  Eyes:     Conjunctiva/sclera: Conjunctivae normal.     Pupils: Pupils are equal, round, and reactive to light.  Cardiovascular:     Rate and Rhythm: Normal rate and regular rhythm.     Pulses: Normal pulses.  Pulmonary:     Effort: Pulmonary effort is normal. No respiratory distress.     Breath  sounds: Normal breath sounds. No wheezing.  Abdominal:     General: There is no distension.     Palpations: Abdomen is soft. There is no mass.     Tenderness: There is no abdominal tenderness. There is no guarding or rebound.  Musculoskeletal:        General: Normal range of motion.     Cervical back: Normal range of motion and neck supple.     Right lower leg: No edema.     Left lower leg: No edema.     Comments: No leg pain or swelling  Skin:    General: Skin is warm and dry.     Capillary Refill: Capillary refill takes less than 2 seconds.  Neurological:  General: No focal deficit present.     Mental Status: He is alert and oriented to person, place, and time.     GCS: GCS eye subscore is 4. GCS verbal subscore is 5. GCS motor subscore is 6.     Cranial Nerves: Cranial nerves are intact.     Sensory: Sensation is intact.     Motor: Motor function is intact. No pronator drift.     Coordination: Coordination is intact. Finger-Nose-Finger Test normal.     Comments: No gross neuro deficits. Cn intact. Nose to finger intact. Negative pronator drift.      ED Results / Procedures / Treatments   Labs (all labs ordered are listed, but only abnormal results are displayed) Labs Reviewed  BASIC METABOLIC PANEL - Abnormal; Notable for the following components:      Result Value   Creatinine, Ser 1.65 (*)    GFR calc non Af Amer 37 (*)    GFR calc Af Amer 43 (*)    All other components within normal limits  CBC - Abnormal; Notable for the following components:   MCHC 29.3 (*)    All other components within normal limits  URINALYSIS, ROUTINE W REFLEX MICROSCOPIC  HEPATIC FUNCTION PANEL  CBG MONITORING, ED  TROPONIN I (HIGH SENSITIVITY)    EKG EKG Interpretation  Date/Time:  Sunday Dec 17 2019 21:21:10 EDT Ventricular Rate:  67 PR Interval:  146 QRS Duration: 84 QT Interval:  402 QTC Calculation: 424 R Axis:   89 Text Interpretation: Normal sinus rhythm Possible Inferior  infarct , age undetermined Anterior infarct , age undetermined Abnormal ECG When compared with ECG of 09/25/2019, changes of inferior infarct age undetermined are now present Confirmed by Delora Fuel ([id number]) on 12/17/2019 11:00:57 PM   Radiology No results found.  Procedures Procedures (including critical care time)  Medications Ordered in ED Medications  sodium chloride flush (NS) 0.9 % injection 3 mL (has no administration in time range)    ED Course  I have reviewed the triage vital signs and the nursing notes.  Pertinent labs & imaging results that were available during my care of the patient were reviewed by me and considered in my medical decision making (see chart for details).    MDM Rules/Calculators/A&P                      Patient presenting for evaluation of speech difficulties last night.  I saw patient approximately 9-1/2 hours after his arrival to the ER.  On my exam, patient had no neuro deficits.  Speech symptoms have since resolved.  Labs obtained from triage interpreted by me, overall reassuring.  Creatinine mildly elevated from baseline at 1.65, however not double his baseline.  Urine without obvious infection.  Electrolytes otherwise stable.  Due to patient's history of A. fib status post ablation, not on anticoagulation, concern for possible stroke.  Favor TIA as he is symptom-free at this time, however he will need further work-up.  EKG changed from previous, as such troponin was added on, though patient is chest pain-free and I doubt ACS at this time.  MRI negative for acute findings including stroke.  Of note, patient is hypertensive, but has not had his daily blood pressure medicine.  Home meds given.  Will consult with neuro, as I believe patient will need to be admitted for TIA work-up.  He has an ABCD 2 score of 4, showing moderate risk.  Discussed with Dr. Cheral Marker from neurology, who  agrees to admission.  Will call medicine to admit.  Discussed with Dr. Lorin Mercy  from triad hospitalist service, patient to be admitted.  Final Clinical Impression(s) / ED Diagnoses Final diagnoses:  Aphasia  TIA (transient ischemic attack)    Rx / DC Orders ED Discharge Orders    None       Franchot Heidelberg, PA-C 12/18/19 [id number]    Tegeler, Gwenyth Allegra, MD 12/18/19 1046

## 2019-12-18 NOTE — Progress Notes (Signed)
Echocardiogram 2D Echocardiogram has been performed.  Oneal Deputy Jakyia Gaccione 12/18/2019, 3:13 PM

## 2019-12-18 NOTE — Chronic Care Management (AMB) (Signed)
  Chronic Care Management   Inpatient Admit Review Note  12/18/2019 Name: Marcus Johnston MRN: [id number] DOB: 18-Mar-1931  Marcus Johnston is a 86 y.o. year old male who is a primary care patient of Glendale Chard, MD. Marcus Johnston is actively engaged with the embedded care management team in the primary care practice and is being followed by Pharmacist for assistance with disease management and care coordination needs related to HTN and DMII.   Marcus Johnston is currently admitted to the hospital for evaluation and treatment of aphasia and possible TIA. Current treatment plan is:  MRI negative for acute findings including stroke.  Of note, patient is hypertensive, but has not had his daily blood pressure medicine.  Home meds given.  Will consult with neuro, as I believe patient will need to be admitted for TIA work-up.  He has an ABCD 2 score of 4, showing moderate risk.  Discussed with Dr. Cheral Marker from neurology, who agrees to admission.  Will call medicine to admit.  Discussed with Dr. Lorin Mercy from triad hospitalist service, patient to be admitted.  Plan: CM team will collaborate with [facility] and will follow patient post discharge.    Daneen Schick, BSW, CDP Social Worker, Certified Dementia Practitioner [facility] [phone]

## 2019-12-18 NOTE — H&P (Signed)
History and Physical    Marcus Johnston [id number] DOB: [birth year] DOA: 12/17/2019  PCP: Glendale Chard, MD Consultants:  Elisha Ponder - podiatry; Coralyn Pear - retina; Patwardhan/Taylor - cardiology; Buddy Duty - endocrinology;  Patient coming from:  Home - lives with spouse; NOK: Wife, Marcus Johnston, [phone]  Chief Complaint: Fatigue  HPI: Marcus Johnston is a 86 y.o. male with medical history significant of HTN; HLD; DM; afib s/p ablation and not on [facility]; and h/o prostate CA presenting with fatigue.  He reports that he was sitting last night and when he attempted to talk he was stuttering and couldn't get his words out.  He knew what he wanted to say, felt ok mentally.  His wife thought he was a bit confused.  Symptoms lasted about 5 minutes and resolved completely.  No prior h/o similar.  He had an afib ablation last September.  He has "always had" an irregular heart beat but this has not continued since the ablation.  He did not notice cardiac symptoms yesterday.  He felt well yesterday before this event.  He fell and hit his head in the past and he "went through these things to fix that."  He had a routine cards visit after that and his BP was high and he was given a pill.  He was left alone in the room and he passed out in the chair and awakened in the chair.  This just happened a couple of weeks ago and he has had no problems since, until now.    He has been having problems with elevated BP.  He has been taking pills.  He bought a new BP meter and the last reading was 157/103.  It has been running high.  It has been "under 200, mainly."      ED Course:  TIA - 830 pm last night with aphasia, mild confusion.  Asymptomatic since in the ER.  MRI negative.  Neurology recommends admission.  ABCD2 score of 4.  Has not had BP meds.    Review of Systems: As per HPI; otherwise review of systems reviewed and negative.   Ambulatory Status:  Ambulates without assistance  COVID Vaccine Status:    Complete  Past Medical History:  Diagnosis Date  . Atrial fibrillation ([place])   . Atypical atrial flutter ([place]) 10/17/2018  . Diabetes mellitus without complication ([place])   . Diabetic retinopathy ([place])    NPDR OU  . High cholesterol   . History of prostate cancer   . HTN (hypertension)   . Hypertensive retinopathy    OU  . Renal insufficiency   . Shingles     Past Surgical History:  Procedure Laterality Date  . ATRIAL TACH ABLATION N/A 05/08/2019   Procedure: ATRIAL TACH ABLATION;  Surgeon: [name], MD;  Location: [place] [facility];  Service: Cardiovascular;  Laterality: N/A;  . CATARACT EXTRACTION Bilateral   . EYE SURGERY Bilateral    Cat Sx  . prostate implant      Social History   Socioeconomic History  . Marital status: Married    Spouse name: Not on file  . Number of children: 5  . Years of education: Not on file  . Highest education level: Not on file  Occupational History  . Occupation: retired  Tobacco Use  . Smoking status: Never Smoker  . Smokeless tobacco: Never Used  Substance and Sexual Activity  . Alcohol use: No  . Drug use: No  . Sexual activity: Yes  Other  Topics Concern  . Not on file  Social History Narrative  . Not on file   Social Determinants of Health   Financial Resource Strain: Low Risk   . Difficulty of Paying Living Expenses: Not very hard  Food Insecurity: No Food Insecurity  . Worried About Charity fundraiser in the Last Year: Never true  . Ran Out of Food in the Last Year: Never true  Transportation Needs: No Transportation Needs  . Lack of Transportation (Medical): No  . Lack of Transportation (Non-Medical): No  Physical Activity: Inactive  . Days of Exercise per Week: 0 days  . Minutes of Exercise per Session: 0 min  Stress: No Stress Concern Present  . Feeling of Stress : Not at all  Social Connections:   . Frequency of Communication with Friends and Family:   . Frequency of Social Gatherings with Friends and  Family:   . Attends Religious Services:   . Active Member of Clubs or Organizations:   . Attends Archivist Meetings:   Marland Kitchen Marital Status:   Intimate Partner Violence: Not At Risk  . Fear of Current or Ex-Partner: No  . Emotionally Abused: No  . Physically Abused: No  . Sexually Abused: No    Allergies  Allergen Reactions  . Bidil [Isosorb Dinitrate-Hydralazine] Other (See Comments)    Severe hypotension episode after administering one dose of Bidil 20-37.5 mg on 09/25/2019.   . Statins Other (See Comments)    Leg pain    Family History  Problem Relation Age of Onset  . Cancer Mother   . Cancer Father     Prior to Admission medications   Medication Sig Start Date End Date Taking? Authorizing Provider  acetaminophen (TYLENOL) 325 MG tablet Take 650 mg by mouth every 6 (six) hours as needed for mild pain or fever.   Yes [provider]  Bromfenac Sodium (PROLENSA) 0.07 % SOLN Place 1 drop into both eyes 4 (four) times daily. 12/01/19  Yes [name], MD  carvedilol (COREG) 12.5 MG tablet Take 1 tablet (12.5 mg total) by mouth 2 (two) times daily with a meal. 09/18/19  Yes Patwardhan, Manish J, MD  Cholecalciferol (VITAMIN D3) 25 MCG (1000 UT) CAPS Take 1,000 Units by mouth daily.    Yes [provider]  doxazosin (CARDURA) 4 MG tablet TAKE 1 TABLET BY MOUTH AT  BEDTIME Patient taking differently: Take 2 mg by mouth daily with supper.  06/16/19  Yes Glendale Chard, MD  fexofenadine (ALLEGRA) 180 MG tablet Take 180 mg by mouth daily as needed (allergies.).    Yes [provider]  insulin lispro protamine-lispro (HUMALOG 50/50 MIX) (50-50) 100 UNIT/ML SUSP injection Inject 22-25 Units into the skin See admin instructions. Inject 25 units subcutaneously with breakfast and 22 units with supper   Yes [provider]  losartan-hydrochlorothiazide (HYZAAR) 100-25 MG tablet TAKE 1 TABLET BY MOUTH  DAILY Patient taking differently: Take 1 tablet by  mouth daily with breakfast.  04/10/19  Yes Glendale Chard, MD  metFORMIN (GLUCOPHAGE-XR) 500 MG 24 hr tablet Take 1,000 mg by mouth 2 (two) times daily with a meal.    Yes [provider]  prednisoLONE acetate (PRED FORTE) 1 % ophthalmic suspension Place 1 drop into both eyes 4 (four) times daily. 12/01/19  Yes [name], MD  albuterol (PROVENTIL HFA;VENTOLIN HFA) 108 (90 Base) MCG/ACT inhaler Inhale 2 puffs into the lungs every 4 (four) hours as needed for wheezing or shortness of breath (  cough, shortness of breath or wheezing.). Patient not taking: Reported on 12/17/2019 08/04/18   Robyn Haber, MD  Insulin Syringe-Needle U-100 (EASY TOUCH INSULIN SYRINGE) 30G X 5/16" 0.5 ML MISC USE 3 TIMES DAILY 09/20/19   Glendale Chard, MD    Physical Exam: Vitals:   12/18/19 1200 12/18/19 1215 12/18/19 1230 12/18/19 1300  BP:   (!) 222/92 (!) 157/84  Pulse: (!) 58 63 72 67  Resp:      Temp:      TempSrc:      SpO2: 99% 100% 98% 99%  Weight:      Height:         . General:  Appears calm and comfortable and is NAD . Eyes:  PERRL, EOMI, normal lids, iris . ENT:  grossly normal hearing, lips & tongue, mmm; appropriate dentition . Neck:  no LAD, masses or thyromegaly; no carotid bruits . Cardiovascular:  RRR, no m/r/g. 1-2+ chronic LE edema.  Marland Kitchen Respiratory:   CTA bilaterally with no wheezes/rales/rhonchi.  Normal respiratory effort. . Abdomen:  soft, NT, ND, NABS . Skin:  no rash or induration seen on limited exam . Musculoskeletal:  grossly normal tone BUE/BLE, good ROM, no bony abnormality . Psychiatric:  grossly normal mood and affect, speech fluent and appropriate, AOx3 . Neurologic:  CN 2-12 grossly intact, moves all extremities in coordinated fashion    Radiological Exams on Admission: MR BRAIN WO CONTRAST  Result Date: 12/18/2019 CLINICAL DATA:  Neuro deficit, subacute. EXAM: MRI HEAD WITHOUT CONTRAST TECHNIQUE: Multiplanar, multiecho pulse sequences of the brain and  surrounding structures were obtained without intravenous contrast. COMPARISON:  MRI/MRA head 09/25/2019. FINDINGS: Brain: Stable appearance of moderate scattered T2/FLAIR hyperintensity within the cerebral white matter which is nonspecific, but consistent with chronic small vessel ischemic disease. Redemonstrated chronic right thalamic lacunar infarct. Stable, mild generalized parenchymal atrophy. Nonspecific chronic microhemorrhage within the left frontal lobe (series 7, image 69). There is no acute infarct. No evidence of intracranial mass. No extra-axial fluid collection. No midline shift. Vascular: Expected proximal arterial flow voids. Skull and upper cervical spine: No focal marrow lesion. Incompletely assessed upper cervical spondylosis. Sinuses/Orbits: Bilateral lens replacements. No significant paranasal sinus disease or mastoid effusion at the imaged levels. IMPRESSION: 1. No evidence of acute intracranial abnormality, including acute infarction. 2. Mild generalized parenchymal atrophy and moderate chronic small vessel ischemic disease, stable. 3. Redemonstrated chronic right thalamic lacunar infarct. Electronically Signed   By: Kellie Simmering DO   On: 12/18/2019 08:29    EKG: Independently reviewed.  NSR with rate 67; nonspecific ST changes which appear to be new compared to prior; no STEMI   Labs on Admission: I have personally reviewed the available labs and imaging studies at the time of the admission.  Pertinent labs:   BUN 23/Creatinine 1.65/GFR 43; 17/1.24/>60 on 09/25/19 HS troponin 6, 5 Unremarkable CBC UA: small Hgb, 5 ketones, 100 protein, rare bacteria   Assessment/Plan Principal Problem:   Hypertensive crisis Active Problems:   Essential hypertension   Dyslipidemia   DM (diabetes mellitus) (HCC)   Atrial tachycardia ([place])   Hypertensive crisis -Patient presenting with transient aphasia and markedly elevated BP, concerning for hypertensive crisis -TIA is a consideration,  but appears to be less likely given the description and also the negative MRI -He did have evidence of end organ failure, with aphasia and acute kidney injury -His home BP medications were resumed in the ER with improved BP control -Based on his BP control no longer in  a dangerous range, will observe patient on telemetry rather than placing in ICU -Will check echo and carotid doppler -Neurology was consulted in the ER but is comfortable with telephone consultation at this time; they have requested reconsultation if additional assistance is needed -Continue Coreg, Cardura, and Hyzaar -Of note, at recent cardiology clinic visit (2/8) he was given a one-time dose of Bidil that caused a hypotensive response (200/100 -> 68/54) with associated apnea; he was sent to the ER for evaluation of this exaggerated BP response and associated global hypoperfusion and possible vasovagal episode -Will not add prn hydralazine at this time based on above  AKI on stage 2 CKD -Thought to be 2* to hypertensive crisis -Will control BP and recheck BMP in AM -If ongoing concern about renal function, consider holding/stopping HCTZ and/or ACE  Atrial tachycardia -s/p ablation in 04/2019 -No recurrence since -If patient is found to be in afib on tele overnight, this may make TIA more likely  HLD -LDL < 70 in 03/2019 -He does not appear to be taking medication for this issue at this time  DM, uncontrolled -Will check A1c = 8.0 -hold Glucophage -Cover with moderate-scale SSI  Obesity Body mass index is 30.01 kg/m.  -Weight loss should be encouraged     Note: This patient has been tested and is negative for the novel coronavirus COVID-19.  DVT prophylaxis:  Lovenox  Code Status:  DNR - confirmed with patient Family Communication: None present; I spoke with the patient's wife by telephone at the time of admission. Disposition Plan:  The patient is from: home  Anticipated d/c is to: home without [facility]  services  Anticipated d/c date will depend on clinical response to treatment, but possibly as early as tomorrow if he has excellent response to treatment  Patient is currently: acutely ill Consults called: Neurology by telephone only; PT/OT/ST/Nutrition Admission status:  It is my clinical opinion that referral for OBSERVATION is reasonable and necessary in this patient based on the above information provided. The aforementioned taken together are felt to place the patient at high risk for further clinical deterioration. However it is anticipated that the patient may be medically stable for discharge from the hospital within 24 to 48 hours.    Karmen Bongo MD [facility]   How to contact the [facility] Attending or Consulting provider [place] or covering provider during after hours [place], for this patient?  1. Check the care team in [facility] and look for a) attending/consulting TRH provider listed and b) the [facility] team listed 2. Log into [email or web address] and use [place]'s universal password to access. If you do not have the password, please contact the hospital operator. 3. Locate the [facility] provider you are looking for under [facility] and page to a number that you can be directly reached. 4. If you still have difficulty reaching the provider, please page the [facility] (Director on Call) for the Hospitalists listed on amion for assistance.   12/18/2019, 1:41 PM

## 2019-12-19 ENCOUNTER — Telehealth: Payer: Self-pay

## 2019-12-19 DIAGNOSIS — I1310 Hypertensive heart and chronic kidney disease without heart failure, with stage 1 through stage 4 chronic kidney disease, or unspecified chronic kidney disease: Secondary | ICD-10-CM | POA: Diagnosis present

## 2019-12-19 DIAGNOSIS — I169 Hypertensive crisis, unspecified: Secondary | ICD-10-CM | POA: Diagnosis not present

## 2019-12-19 LAB — CBC WITH DIFFERENTIAL/PLATELET
Abs Immature Granulocytes: 0.02 10*3/uL (ref 0.00–0.07)
Basophils Absolute: 0 10*3/uL (ref 0.0–0.1)
Basophils Relative: 1 %
Eosinophils Absolute: 0.1 10*3/uL (ref 0.0–0.5)
Eosinophils Relative: 2 %
HCT: 41.8 % (ref 39.0–52.0)
Hemoglobin: 13.5 g/dL (ref 13.0–17.0)
Immature Granulocytes: 1 %
Lymphocytes Relative: 34 %
Lymphs Abs: 1.2 10*3/uL (ref 0.7–4.0)
MCH: 27.6 pg (ref 26.0–34.0)
MCHC: 32.3 g/dL (ref 30.0–36.0)
MCV: 85.3 fL (ref 80.0–100.0)
Monocytes Absolute: 0.4 10*3/uL (ref 0.1–1.0)
Monocytes Relative: 12 %
Neutro Abs: 1.8 10*3/uL (ref 1.7–7.7)
Neutrophils Relative %: 50 %
Platelets: 177 10*3/uL (ref 150–400)
RBC: 4.9 MIL/uL (ref 4.22–5.81)
RDW: 14.6 % (ref 11.5–15.5)
WBC: 3.6 10*3/uL — ABNORMAL LOW (ref 4.0–10.5)
nRBC: 0 % (ref 0.0–0.2)

## 2019-12-19 LAB — BASIC METABOLIC PANEL
Anion gap: 9 (ref 5–15)
BUN: 18 mg/dL (ref 8–23)
CO2: 22 mmol/L (ref 22–32)
Calcium: 9 mg/dL (ref 8.9–10.3)
Chloride: 103 mmol/L (ref 98–111)
Creatinine, Ser: 1.17 mg/dL (ref 0.61–1.24)
GFR calc Af Amer: >60 mL/min (ref >60)
GFR calc non Af Amer: 56 mL/min — ABNORMAL LOW (ref >60)
Glucose, Bld: 329 mg/dL — ABNORMAL HIGH (ref 70–99)
Potassium: 4 mmol/L (ref 3.5–5.1)
Sodium: 134 mmol/L — ABNORMAL LOW (ref 135–145)

## 2019-12-19 LAB — GLUCOSE, CAPILLARY
Glucose-Capillary: 184 mg/dL — ABNORMAL HIGH (ref 70–99)
Glucose-Capillary: 246 mg/dL — ABNORMAL HIGH (ref 70–99)
Glucose-Capillary: 195 mg/dL — ABNORMAL HIGH (ref 70–99)
Glucose-Capillary: 229 mg/dL — ABNORMAL HIGH (ref 70–99)

## 2019-12-19 LAB — LIPID PANEL
Cholesterol: 170 mg/dL (ref 0–200)
HDL: 62 mg/dL (ref >40)
LDL Cholesterol: 95 mg/dL (ref 0–99)
Total CHOL/HDL Ratio: 2.7 RATIO
Triglycerides: 63 mg/dL (ref <150)
VLDL: 13 mg/dL (ref 0–40)

## 2019-12-19 MED ORDER — LABETALOL HCL 5 MG/ML IV SOLN
10.0000 mg | INTRAVENOUS | Status: DC | PRN
  Administered 2019-12-19: 10 mg via INTRAVENOUS
  Filled 2019-12-19: qty 4

## 2019-12-19 MED ORDER — CARVEDILOL 12.5 MG PO TABS: 12.5000 mg | ORAL_TABLET | Freq: Two times a day (BID) | ORAL | Status: DC

## 2019-12-19 MED ORDER — AMLODIPINE BESYLATE 10 MG PO TABS
10.0000 mg | ORAL_TABLET | Freq: Every day | ORAL | Status: DC
  Administered 2019-12-20: 10 mg via ORAL
  Filled 2019-12-19 (×2): qty 1

## 2019-12-19 MED ORDER — INSULIN GLARGINE 100 UNIT/ML ~~LOC~~ SOLN
20.0000 [IU] | Freq: Every day | SUBCUTANEOUS | Status: DC
  Administered 2019-12-19 – 2019-12-20 (×2): 20 [IU] via SUBCUTANEOUS
  Filled 2019-12-19 (×2): qty 0.2

## 2019-12-19 MED ORDER — HYDRALAZINE HCL 20 MG/ML IJ SOLN: 10.0000 mg | Freq: Four times a day (QID) | INTRAMUSCULAR | Status: DC | PRN

## 2019-12-19 MED ORDER — AMLODIPINE BESYLATE 5 MG PO TABS
5.0000 mg | ORAL_TABLET | Freq: Once | ORAL | Status: AC
  Administered 2019-12-19: 5 mg via ORAL

## 2019-12-19 MED ORDER — AMLODIPINE BESYLATE 5 MG PO TABS
5.0000 mg | ORAL_TABLET | Freq: Every day | ORAL | Status: DC
  Administered 2019-12-19: 5 mg via ORAL
  Filled 2019-12-19: qty 1

## 2019-12-19 NOTE — Progress Notes (Signed)
Received order for Labetalol 10mg  IV. Monitoring for effects.

## 2019-12-19 NOTE — TOC Initial Note (Signed)
Transition of Care [facility]) - Initial/Assessment Note    Patient Details  Name: Marcus Johnston MRN: [id number] Date of Birth: 12-09-1930  Transition of Care [facility]) CM/SW Contact:    Marilu Favre, RN Phone Number: 12/19/2019, 5:00 PM  Clinical Narrative:                  Patient from home with wife who can provide 24 hour supervision. Confirmed face sheet information  Expected Discharge Plan: Home/Self Care Barriers to Discharge: Continued Medical Work up   Patient Goals and CMS Choice Patient states their goals for this hospitalization and ongoing recovery are:: to return to home CMS Medicare.gov Compare Post Acute Care list provided to:: Patient Choice offered to / list presented to : NA  Expected Discharge Plan and Services Expected Discharge Plan: Home/Self Care       Living arrangements for the past 2 months: Single Family Home                 DME Arranged: N/A         HH Arranged: NA          Prior Living Arrangements/Services Living arrangements for the past 2 months: Single Family Home Lives with:: Spouse Patient language and need for interpreter reviewed:: Yes Do you feel safe going back to the place where you live?: Yes      Need for Family Participation in Patient Care: Yes (Comment) Care giver support system in place?: Yes (comment)   Criminal Activity/Legal Involvement Pertinent to Current Situation/Hospitalization: No - Comment as needed  Activities of Daily Living      Permission Sought/Granted   Permission granted to share information with : No              Emotional Assessment Appearance:: Appears stated age Attitude/Demeanor/Rapport: Engaged Affect (typically observed): Accepting Orientation: : Oriented to Situation, Oriented to  Time, Oriented to Place, Oriented to Self Alcohol / Substance Use: Not Applicable Psych Involvement: No (comment)  Admission diagnosis:  Aphasia [R47.01] TIA (transient ischemic attack)  [G45.9] Malignant hypertension [I10] Patient Active Problem List   Diagnosis Date Noted  . Malignant hypertension 12/19/2019  . Hypertensive crisis 12/18/2019  . Diabetic retinopathy ([place])   . Hypotension due to drugs 09/26/2019  . Groin swelling 05/17/2019  . Bruit (arterial) 05/17/2019  . SVT (supraventricular tachycardia) ([place]) 05/08/2019  . Atrial tachycardia ([place]) 05/02/2019  . Bradycardia 04/30/2019  . Syncope 04/30/2019  . Sick sinus syndrome ([name]) 04/30/2019  . Syncope and collapse 04/29/2019  . Leg edema 10/17/2018  . Type 2 diabetes mellitus with other circulatory complications ([name]) 0000000  . Hyperglycemia   . Essential hypertension 11/12/2016  . Dyslipidemia 11/12/2016  . DM (diabetes mellitus) ([name]) 11/12/2016  . Renal insufficiency 11/12/2016   PCP:  Glendale Chard, MD Pharmacy:   [id number] - [place], [place] - [name] [address] [address] Phone: [phone] Fax: [phone]     Social Determinants of Health (SDOH) Interventions    Readmission Risk Interventions No flowsheet data found.

## 2019-12-19 NOTE — Progress Notes (Signed)
Nutrition Brief Note  RD consulted for assessment of nutritional requirements/ status (TIA).   Wt Readings from Last 15 Encounters:  12/17/19 108.9 kg  09/25/19 108.9 kg  08/28/19 107.8 kg  08/14/19 109.1 kg  08/09/19 108.9 kg  07/06/19 108.7 kg  06/22/19 108.2 kg  06/15/19 110.4 kg  06/05/19 109.3 kg  05/17/19 108.9 kg  05/15/19 109.7 kg  05/09/19 107.3 kg  04/30/19 109.3 kg  04/19/19 108.3 kg  03/23/19 108.3 kg   Marcus Johnston is a 86 y.o. male with medical history significant of HTN; HLD; DM; afib s/p ablation and not on AC; and h/o prostate CA presenting with fatigue.  Pt admitted with hypertensive crisis.   Reviewed I/O's: +720ml x 24 hours  Spoke with, pt who was sitting in recliner chair at time of visit. Pt was pleasant and in good spirits, smiling and joking with this RD. He reports feeling better and having a good appetite. His lunch just arrived and he is ready to eat. Noted meal completion 100%. Pt reports consuming some breakfast, but typically eats a small meal for breakfast at baseline.   PTA, pt consumes 3 meals per day (Breakfast: yogurt, half bagel, and sausage; Lunch: salami sandwich; Dinner: "anything from soup to steak- my wife is a great cook"). Pt indicates that dinner is usually his largest meal. He denies any changes in eating pattern or intake. He denies difficulty chewing or swallowing.   Per pt, UBW is around 230#. He noticed that his weight was a little down from usual, "it was 227# a while ago, but I think my scale need new batteries". He denies changes in activity or his clothing fitting differently. Per wt hx, pt wt has been stable over the past 6 months.   Nutrition-Focused physical exam completed. Findings are no fat depletion, no muscle depletion, and mild edema.   Medications reviewed and include colace.   Lab Results  Component Value Date   HGBA1C 8.0 (H) 12/18/2019  PTA DM medications are 25 units insulin lispro protamine-lispro with  breakfast, 22 units insulin lispro protamine-lispro with dinner, and 1000 mg metformin BID. Per ADA's Standards of Medical Care of Diabetes, glycemic targets for older adults who have multiple co-morbidities, cognitive impairments, and functional dependence should be less stringent (Hgb A1c <8.0-8.5).   Labs reviewed: CBGS: 184-250 (inpatient orders for glycemic control are 0-15 units insulin aspart TID and 0-5 units insulin aspart q HS).   Current diet order is heart healthy/ carb modified, patient is consuming approximately 100% of meals at this time. Labs and medications reviewed.   No nutrition interventions warranted at this time. If nutrition issues arise, please consult RD.   Loistine Chance, RD, LDN, [place] Registered Dietitian II Certified Diabetes Care and Education Specialist Please refer to [facility] for RD and/or RD on-call/weekend/after hours pager

## 2019-12-19 NOTE — Telephone Encounter (Signed)
The pt left a message that he was getting rid of his cardiologist and wanted to know if Dr. Baird Cancer could handle his cardiac issues.  I was calling the pt back to let him know that Dr. Baird Cancer said that he would have to have the cardiologist handle his cardiac issues.

## 2019-12-19 NOTE — Care Management CC44 (Signed)
Condition Code 44 Documentation Completed  Patient Details  Name: Marcus Johnston MRN: [id number] Date of Birth: 02-05-1931   Condition Code 44 given:  Yes Patient signature on Condition Code 44 notice:  Yes Documentation of 2 MD's agreement:  Yes Code 44 added to claim:  Yes    Marilu Favre, RN 12/19/2019, 4:57 PM

## 2019-12-19 NOTE — Progress Notes (Signed)
Oncall MD paged in order to receive PRN order for increased b/p. Awaiting new order.

## 2019-12-19 NOTE — Evaluation (Signed)
Physical Therapy Evaluation Patient Details Name: Marcus Johnston MRN: [id number] DOB: 1931/05/12 Today's Date: 12/19/2019   History of Present Illness  86 y.o. male with medical history significant of HTN; HLD; DM; afib s/p ablation and not on [facility]; and h/o prostate CA presenting with fatigue, TIA workup.  Clinical Impression   Pt presents with good tolerance for activity with [facility] strength, and mild unsteadiness in standing especially with challenges to gait. Pt to benefit from acute PT to address deficits. Pt ambulated great hallway distance with intermittent assist from IV pole for steadying. Pt with 2 periods of unsteadiness during gait requiring PT light assist to correct. PTA, pt was very independent and this PT feels pt is close to baseline other than mild unsteadiness. PT recommending acute PT continue following, but no follow up recommended at this time. PT encouraged pt to have wife with him when mobilizing upon d/c for the first couple of days, pt understands but states "I won't need help, but okay". PT to progress mobility as tolerated, and will continue to follow acutely.      Follow Up Recommendations No PT follow up;Supervision for mobility/OOB    Equipment Recommendations  None recommended by PT    Recommendations for Other Services       Precautions / Restrictions Precautions Precautions: Fall Restrictions Weight Bearing Restrictions: No      Mobility  Bed Mobility               General bed mobility comments: OOB in recliner  Transfers Overall transfer level: Needs assistance Equipment used: None Transfers: Sit to/from Stand Sit to Stand: Supervision         General transfer comment: for safety, good time to rise and steady self. Pt reaching for IV pole upon standing.  Ambulation/Gait Ambulation/Gait assistance: Supervision;Min guard Gait Distance (Feet): 450 Feet Assistive device: None;IV Pole Gait Pattern/deviations: Step-through pattern;Drifts  right/left Gait velocity: WFL for age   General Gait Details: Supervision for safety, occasional close guard as pt with 2 periods of unsteadiness requiring light assist to correct ( with stair navigation, distraction). Pt initially walking slowly, but when prompted pt assumes "normal walking speed". Walking speed [facility] for age.  Stairs Stairs: Yes Stairs assistance: Min guard Stair Management: One rail Left;Forwards;Alternating pattern Number of Stairs: 5 General stair comments: min guard for safety, pt unsteady without use of rail and PT had to encourage rail use. Improved performance after this  Wheelchair Mobility    Modified Rankin (Stroke Patients Only)       Balance Overall balance assessment: Needs assistance Sitting-balance support: No upper extremity supported Sitting balance-Leahy Scale: Normal     Standing balance support: No upper extremity supported;During functional activity Standing balance-Leahy Scale: Good Standing balance comment: mild unsteadiness with higher level balance challenges and distraction (see below)             High level balance activites: Head turns;Turns;Direction changes;Other (comment) High Level Balance Comments: WFL balance during directional changes, 180* turns. Mild unsteadiness with pt occasionally reaching for environment to correct during horizontal head turning, stair navigation.             Pertinent Vitals/Pain Pain Assessment: No/denies pain    Home Living Family/patient expects to be discharged to:: Private residence Living Arrangements: Spouse/significant other Available Help at Discharge: Family;Available 24 hours/day Type of Home: House Home Access: Stairs to enter   [employer] of Steps: 1 and 1 Home Layout: Two level;Bed/bath upstairs Home Equipment: None  Prior Function Level of Independence: Independent         Comments: pt reports no use of AD PTA, drives. Pt enjoys making music, and  has multiple woodwinds     Hand Dominance   Dominant Hand: Right    Extremity/Trunk Assessment   Upper Extremity Assessment Upper Extremity Assessment: Defer to OT evaluation    Lower Extremity Assessment Lower Extremity Assessment: Overall WFL for tasks assessed    Cervical / Trunk Assessment Cervical / Trunk Assessment: Normal  Communication   Communication: No difficulties  Cognition Arousal/Alertness: Awake/alert Behavior During Therapy: WFL for tasks assessed/performed Overall Cognitive Status: Within Functional Limits for tasks assessed Area of Impairment: Safety/judgement                         Safety/Judgement: Decreased awareness of deficits     General Comments: Pleasant, cooperative with PT. Pt lacking insight into deficits, as pt with 2 periods of stumbling requiring light steadying assist from PT but pt states "I won't need any help when I leave the hospital"      General Comments General comments (skin integrity, edema, etc.): HRmax 110 bpm during gait    Exercises Other Exercises Other Exercises: reviewed BE FAST s/s of CVA, pt with no recall of s/s of CVA even though Bre OT reviewed with pt 30 minutes prior to PT session   Assessment/Plan    PT Assessment Patient needs continued PT services  PT Problem List Decreased mobility;Decreased activity tolerance;Decreased balance       PT Treatment Interventions Therapeutic activities;Gait training;Therapeutic exercise;Patient/family education;Neuromuscular re-education;Balance training;Functional mobility training;Stair training    PT Goals (Current goals can be found in the Care Plan section)  Acute Rehab PT Goals Patient Stated Goal: go home PT Goal Formulation: With patient Time For Goal Achievement: 01/02/20 Potential to Achieve Goals: Good    Frequency Min 3X/week   Barriers to discharge        Co-evaluation               AM-PAC PT "6 Clicks" Mobility  Outcome Measure  Help needed turning from your back to your side while in a flat bed without using bedrails?: None Help needed moving from lying on your back to sitting on the side of a flat bed without using bedrails?: None Help needed moving to and from a bed to a chair (including a wheelchair)?: None Help needed standing up from a chair using your arms (e.g., wheelchair or bedside chair)?: None Help needed to walk in hospital room?: A Little Help needed climbing 3-5 steps with a railing? : A Little 6 Click Score: 22    End of Session   Activity Tolerance: Patient tolerated treatment well Patient left: in chair;with call bell/phone within reach Nurse Communication: Mobility status PT Visit Diagnosis: Unsteadiness on feet (R26.81);Other abnormalities of gait and mobility (R26.89)    Time: [id number] PT Time Calculation (min) (ACUTE ONLY): 11 min   Charges:   PT Evaluation $PT Eval Low Complexity: 1 Low         Arney Mayabb E, PT Acute Rehabilitation Services Pager [phone]  Office [phone]  Kaydin Labo D Elke Holtry 12/19/2019, 10:26 AM

## 2019-12-19 NOTE — Care Management Obs Status (Signed)
[place] NOTIFICATION   Patient Details  Name: Marcus Johnston MRN: [id number] Date of Birth: Feb 24, 1931   Medicare Observation Status Notification Given:  Yes    Marilu Favre, RN 12/19/2019, 4:57 PM

## 2019-12-19 NOTE — Evaluation (Signed)
Occupational Therapy Evaluation and Discharge Patient Details Name: Marcus Johnston MRN: [id number] DOB: 1930-10-01 Today's Date: 12/19/2019    History of Present Illness 86 y.o. male with medical history significant of HTN; HLD; DM; afib s/p ablation and not on [facility]; and h/o prostate CA presenting with fatigue, TIA workup.   Clinical Impression   OT eval completed with no further acute OT needs identified. Pt performing room and hallway level mobility tasks pushing IV pole today at distant supervision level and with no overt LOB; reports he has been up ad lib completing ADL tasks without difficulty. Pt appears at/close to his baseline (pt in agreement). Pt reports he lives at home with spouse, typically is independent with ADL, iADL and functional mobility tasks. He reports initial symptoms have since subsided since initial admit. Educated pt in signs/symptoms of stroke including BEFAST acronym with pt verbalizing understanding. Questions answered throughout. Do not anticipate pt will require follow up OT services at time of discharge; will sign off at this time. Thank you for this referral.     Follow Up Recommendations  No OT follow up;Supervision - Intermittent    Equipment Recommendations  None recommended by OT           Precautions / Restrictions Precautions Precautions: None Restrictions Weight Bearing Restrictions: No      Mobility Bed Mobility               General bed mobility comments: OOB in recliner  Transfers Overall transfer level: Modified independent                    Balance Overall balance assessment: No apparent balance deficits (not formally assessed)                                         ADL either performed or assessed with clinical judgement   ADL Overall ADL's : At baseline                                       General ADL Comments: pt performing room and hallway level mobility without AD at  distant supervision -mod independent level, reports has been up ad lib performing ADL without difficulty, endorses he feels at his baseline      Vision Baseline Vision/History: No visual deficits Patient Visual Report: No change from baseline Vision Assessment?: No apparent visual deficits     Perception     Praxis      Pertinent Vitals/Pain Pain Assessment: No/denies pain     Hand Dominance Right   Extremity/Trunk Assessment Upper Extremity Assessment Upper Extremity Assessment: Overall WFL for tasks assessed   Lower Extremity Assessment Lower Extremity Assessment: Defer to PT evaluation;Overall [facility] for tasks assessed   Cervical / Trunk Assessment Cervical / Trunk Assessment: Normal   Communication Communication Communication: No difficulties   Cognition Arousal/Alertness: Awake/alert Behavior During Therapy: WFL for tasks assessed/performed Overall Cognitive Status: Within Functional Limits for tasks assessed                                     General Comments       Exercises     Shoulder Instructions      Home Living Family/patient  expects to be discharged to:: Private residence Living Arrangements: Spouse/significant other Available Help at Discharge: Family;Available 24 hours/day Type of Home: House Home Access: Stairs to enter [employer] of Steps: 1 and 1   Home Layout: Two level;Bed/bath upstairs Alternate Level Stairs-Number of Steps: stairs-landing-stairs (6, 9) Alternate Level Stairs-Rails: Right Bathroom Shower/Tub: Walk-in shower;Door   [employer] Toilet: Handicapped height     Home Equipment: None          Prior Functioning/Environment Level of Independence: Independent        Comments: driving        OT Problem List: Decreased activity tolerance;Decreased knowledge of precautions      OT Treatment/Interventions:      OT Goals(Current goals can be found in the care plan section) Acute Rehab OT  Goals OT Goal Formulation: All assessment and education complete, DC therapy  OT Frequency:     Barriers to D/C:            Co-evaluation              AM-PAC OT "6 Clicks" Daily Activity     Outcome Measure Help from another person eating meals?: None Help from another person taking care of personal grooming?: None Help from another person toileting, which includes using toliet, bedpan, or urinal?: None Help from another person bathing (including washing, rinsing, drying)?: None Help from another person to put on and taking off regular upper body clothing?: None Help from another person to put on and taking off regular lower body clothing?: None 6 Click Score: 24   End of Session Nurse Communication: Mobility status  Activity Tolerance: Patient tolerated treatment well Patient left: in chair;with call bell/phone within reach  OT Visit Diagnosis: Other abnormalities of gait and mobility (R26.89);Other symptoms and signs involving the nervous system (R29.898)                Time: [id number] OT Time Calculation (min): 18 min Charges:  OT General Charges $OT Visit: 1 Visit OT Evaluation $OT Eval Moderate Complexity: Russell, OT Acute Rehabilitation Services Pager [phone] Office [place] 12/19/2019, 9:17 AM

## 2019-12-19 NOTE — Progress Notes (Signed)
PROGRESS NOTE    Marcus Johnston  [id number] DOB: [birth year] DOA: 12/17/2019 PCP: Glendale Chard, MD   Brief Narrative:  HPI: Marcus Johnston is a 86 y.o. male with medical history significant of HTN; HLD; DM; afib s/p ablation and not on [facility]; and h/o prostate CA presenting with fatigue.  He reports that he was sitting last night and when he attempted to talk he was stuttering and couldn't get his words out.  He knew what he wanted to say, felt ok mentally.  His wife thought he was a bit confused.  Symptoms lasted about 5 minutes and resolved completely.  No prior h/o similar.  He had an afib ablation last September.  He has "always had" an irregular heart beat but this has not continued since the ablation.  He did not notice cardiac symptoms yesterday.  He felt well yesterday before this event.  He fell and hit his head in the past and he "went through these things to fix that."  He had a routine cards visit after that and his BP was high and he was given a pill.  He was left alone in the room and he passed out in the chair and awakened in the chair.  This just happened a couple of weeks ago and he has had no problems since, until now.    He has been having problems with elevated BP.  He has been taking pills.  He bought a new BP meter and the last reading was 157/103.  It has been running high.  It has been "under 200, mainly."      ED Course:  TIA - 830 pm last night with aphasia, mild confusion.  Asymptomatic since in the ER.  MRI negative.  Neurology recommends admission.  ABCD2 score of 4.  Has not had BP meds.    Assessment & Plan:   Principal Problem:   Hypertensive crisis Active Problems:   Essential hypertension   Dyslipidemia   DM (diabetes mellitus) (HCC)   Atrial tachycardia (HCC)   Hypertensive crisis/malignant hypertension:  -Patient presenting with transient aphasia (resolved prior to arriving to ED) and markedly elevated BP, concerning for hypertensive crisis.   MRI brain negative for any acute stroke.  Patient had MR angiogram head and neck in February this year which was unremarkable so there is no indication to repeat vascular study.  Will cancel carotid Doppler. His home BP medications were resumed in the ER with improved BP control -Neurology was consulted in the ER but is comfortable with telephone consultation at this time; they have requested reconsultation if additional assistance is needed His home medications were resumed which include Cardura, Hyzaar and Cardura.  His blood pressure has remained elevated despite of receiving his home dose of Coreg.  I initiated him on amlodipine 5 mg with no improvement.  Gave him another 5 mg of amlodipine with improvement in blood pressure down to 153/81. -Of note, at recent cardiology clinic visit (2/8) he was given a one-time dose of Bidil that caused a hypotensive response (200/100 -> 68/54) with associated apnea; he was sent to the ER for evaluation of this exaggerated BP response and associated global hypoperfusion and possible vasovagal episode-Will not add prn hydralazine at this time based on above.  Continue labetalol as needed.  Next option could be increasing carvedilol or adding clonidine.  AKI on CKD stage IIIa: Likely secondary to hypertensive crisis.  Now back to baseline.  Atrial tachycardia -s/p ablation in 04/2019 -No recurrence  since -If patient is found to be in afib on tele overnight, this may make TIA more likely  HLD -LDL < 70 in 03/2019 -He does not appear to be taking medication for this issue at this time.  He is allergic to statins.  Type 2 diabetes mellitus: Hemoglobin A1c 8.  Seems to be taking Humalog 50/50 at home.  Here he is been only on sliding scale swelling.  Blood sugar elevated.  Will start him on 20 units Lantus.  And continue SSI.  Obesity Body mass index is 30.01 kg/m.  -Weight loss should be encourage  DVT prophylaxis: Lovenox   Code Status: DNR  Family  Communication:  None present at bedside.  Plan of care discussed with patient in length and he verbalized understanding and agreed with it. Patient is from: Home Disposition Plan: Home likely tomorrow Barriers to discharge: Uncontrolled/malignant hypertension  Status is: Observation  The patient will require care spanning > 2 midnights and should be moved to inpatient because: Uncontrolled/malignant hypertension  Dispo: The patient is from: Home              Anticipated d/c is to: Home              Anticipated d/c date is: 1 day              Patient currently is not medically stable to d/c.        Estimated body mass index is 30.01 kg/m as calculated from the following:   Height as of this encounter: 6\' 3"  (1.905 m).   Weight as of this encounter: 108.9 kg.      Nutritional status:               Consultants:   None  Procedures:   None  Antimicrobials:  Anti-infectives (From admission, onward)   None         Subjective: Patient seen and examined.  Sitting in the chair.  Feeling much better.  No complaint.  Denied any headache, swallowing problem, problem with vision or any weakness.  Fluent speech.  Objective: Vitals:   12/19/19 0534 12/19/19 1107 12/19/19 1254 12/19/19 1412  BP: (!) 179/98 (!) 186/90 (!) 191/89 (!) 153/81  Pulse: 76 (!) 58    Resp: 17 17    Temp: 98 F (36.7 C) 98 F (36.7 C)    TempSrc: Oral Oral    SpO2: 97% 99%    Weight:      Height:        Intake/Output Summary (Last 24 hours) at 12/19/2019 1415 Last data filed at 12/19/2019 1105 Gross per 24 hour  Intake 1180.84 ml  Output --  Net 1180.84 ml   Filed Weights   12/17/19 2116  Weight: 108.9 kg    Examination:  General exam: Appears calm and comfortable  Respiratory system: Clear to auscultation. Respiratory effort normal. Cardiovascular system: S1 & S2 heard, RRR. No JVD, murmurs, rubs, gallops or clicks. No pedal edema. Gastrointestinal system: Abdomen is  nondistended, soft and nontender. No organomegaly or masses felt. Normal bowel sounds heard. Central nervous system: Alert and oriented. No focal neurological deficits. Extremities: Symmetric 5 x 5 power. Skin: No rashes, lesions or ulcers Psychiatry: Judgement and insight appear normal. Mood & affect appropriate.    Data Reviewed: I have personally reviewed following labs and imaging studies  CBC: Recent Labs  Lab 12/17/19 2136 12/19/19 0845  WBC 4.6 3.6*  NEUTROABS  --  1.8  HGB 13.7 13.5  HCT 46.7 41.8  MCV 94.3 85.3  PLT 202 [id number]   Basic Metabolic Panel: Recent Labs  Lab 12/17/19 2136 12/19/19 0845  NA 139 134*  K 4.3 4.0  CL 107 103  CO2 23 22  GLUCOSE 97 329*  BUN 23 18  CREATININE 1.65* 1.17  CALCIUM 9.2 9.0   GFR: Estimated Creatinine Clearance: 60.4 mL/min (by C-G formula based on SCr of 1.17 mg/dL). Liver Function Tests: Recent Labs  Lab 12/18/19 0650  AST 16  ALT 12  ALKPHOS 67  BILITOT 0.6  PROT 6.6  ALBUMIN 3.6   No results for input(s): LIPASE, AMYLASE in the last 168 hours. No results for input(s): AMMONIA in the last 168 hours. Coagulation Profile: No results for input(s): INR, PROTIME in the last 168 hours. Cardiac Enzymes: No results for input(s): CKTOTAL, CKMB, CKMBINDEX, TROPONINI in the last 168 hours. BNP (last 3 results) No results for input(s): PROBNP in the last 8760 hours. HbA1C: Recent Labs    12/18/19 1034  HGBA1C 8.0*   CBG: Recent Labs  Lab 12/18/19 1212 12/18/19 1705 12/18/19 2159 12/19/19 0535 12/19/19 1106  GLUCAP 130* 220* 250* 184* 246*   Lipid Profile: Recent Labs    12/19/19 0135  CHOL 170  HDL 62  LDLCALC 95  TRIG 63  CHOLHDL 2.7   Thyroid Function Tests: No results for input(s): TSH, T4TOTAL, FREET4, T3FREE, THYROIDAB in the last 72 hours. Anemia Panel: No results for input(s): VITAMINB12, FOLATE, FERRITIN, TIBC, IRON, RETICCTPCT in the last 72 hours. Sepsis Labs: No results for input(s):  PROCALCITON, LATICACIDVEN in the last 168 hours.  Recent Results (from the past 240 hour(s))  Respiratory Panel by RT PCR (Flu A&B, Covid) - Nasopharyngeal Swab     Status: None   Collection Time: 12/18/19  8:56 AM   Specimen: Nasopharyngeal Swab  Result Value Ref Range Status   SARS Coronavirus 2 by RT PCR NEGATIVE NEGATIVE Final    Comment: (NOTE) SARS-CoV-2 target nucleic acids are NOT DETECTED. The SARS-CoV-2 RNA is generally detectable in upper respiratoy specimens during the acute phase of infection. The lowest concentration of SARS-CoV-2 viral copies this assay can detect is 131 copies/mL. A negative result does not preclude SARS-Cov-2 infection and should not be used as the sole basis for treatment or other patient management decisions. A negative result may occur with  improper specimen collection/handling, submission of specimen other than nasopharyngeal swab, presence of viral mutation(s) within the areas targeted by this assay, and inadequate number of viral copies (<131 copies/mL). A negative result must be combined with clinical observations, patient history, and epidemiological information. The expected result is Negative. Fact Sheet for Patients:  PinkCheek.be Fact Sheet for Healthcare Providers:  GravelBags.it This test is not yet ap proved or cleared by the [place] FDA and  has been authorized for detection and/or diagnosis of SARS-CoV-2 by FDA under an Emergency Use Authorization (EUA). This EUA will remain  in effect (meaning this test can be used) for the duration of the COVID-19 declaration under Section 564(b)(1) of the Act, 21 U.S.C. section 360bbb-3(b)(1), unless the authorization is terminated or revoked sooner.    Influenza A by PCR NEGATIVE NEGATIVE Final   Influenza B by PCR NEGATIVE NEGATIVE Final    Comment: (NOTE) The Xpert Xpress SARS-CoV-2/FLU/RSV assay is intended as an aid in  the  diagnosis of influenza from Nasopharyngeal swab specimens and  should not be used as a sole basis for treatment. Nasal washings and  aspirates are  unacceptable for Xpert Xpress SARS-CoV-2/FLU/RSV  testing. Fact Sheet for Patients: PinkCheek.be Fact Sheet for Healthcare Providers: GravelBags.it This test is not yet approved or cleared by the [place] FDA and  has been authorized for detection and/or diagnosis of SARS-CoV-2 by  FDA under an Emergency Use Authorization (EUA). This EUA will remain  in effect (meaning this test can be used) for the duration of the  Covid-19 declaration under Section 564(b)(1) of the Act, 21  U.S.C. section 360bbb-3(b)(1), unless the authorization is  terminated or revoked. Performed at [facility], [place] [address] [place], [place] [address]       Radiology Studies: MR BRAIN WO CONTRAST  Result Date: 12/18/2019 CLINICAL DATA:  Neuro deficit, subacute. EXAM: MRI HEAD WITHOUT CONTRAST TECHNIQUE: Multiplanar, multiecho pulse sequences of the brain and surrounding structures were obtained without intravenous contrast. COMPARISON:  MRI/MRA head 09/25/2019. FINDINGS: Brain: Stable appearance of moderate scattered T2/FLAIR hyperintensity within the cerebral white matter which is nonspecific, but consistent with chronic small vessel ischemic disease. Redemonstrated chronic right thalamic lacunar infarct. Stable, mild generalized parenchymal atrophy. Nonspecific chronic microhemorrhage within the left frontal lobe (series 7, image 69). There is no acute infarct. No evidence of intracranial mass. No extra-axial fluid collection. No midline shift. Vascular: Expected proximal arterial flow voids. Skull and upper cervical spine: No focal marrow lesion. Incompletely assessed upper cervical spondylosis. Sinuses/Orbits: Bilateral lens replacements. No significant paranasal sinus disease or mastoid effusion at the  imaged levels. IMPRESSION: 1. No evidence of acute intracranial abnormality, including acute infarction. 2. Mild generalized parenchymal atrophy and moderate chronic small vessel ischemic disease, stable. 3. Redemonstrated chronic right thalamic lacunar infarct. Electronically Signed   By: Kellie Simmering DO   On: 12/18/2019 08:29   ECHOCARDIOGRAM COMPLETE  Result Date: 12/18/2019    ECHOCARDIOGRAM REPORT   Patient Name:   Marcus Johnston Date of Exam: 12/18/2019 Medical Rec #:  [id number]        Height:       75.0 in Accession #:    [id number]       Weight:       240.1 lb Date of Birth:  04-06-1931         BSA:          2.372 m Patient Age:    89 years         BP:           171/87 mmHg Patient Gender: M                HR:           70 bpm. Exam Location:  Inpatient Procedure: 2D Echo, Color Doppler and Cardiac Doppler Indications:    TIA  History:        Patient has prior history of Echocardiogram examinations, most                 recent 01/22/2019. Risk Factors:Hypertension, Diabetes and                 Dyslipidemia.  Sonographer:    Raquel Sarna Senior RDCS Referring Phys: [id number] JENNIFER YATES  Sonographer Comments: Technically difficult windows. IMPRESSIONS  1. Left ventricular ejection fraction, by estimation, is 60 to 65%. The left ventricle has normal function. The left ventricle has no regional wall motion abnormalities. There is moderate left ventricular hypertrophy. Left ventricular diastolic parameters are consistent with Grade I diastolic dysfunction (impaired relaxation).  2. Right ventricular systolic function is normal. The right  ventricular size is normal.  3. The mitral valve is normal in structure. No evidence of mitral valve regurgitation. No evidence of mitral stenosis.  4. The aortic valve is normal in structure. Aortic valve regurgitation is not visualized. No aortic stenosis is present.  5. The inferior vena cava is normal in size with greater than 50% respiratory variability, suggesting right atrial  pressure of 3 mmHg. Comparison(s): No significant change from prior study. Prior images reviewed side by side. Conclusion(s)/Recommendation(s): No intracardiac source of embolism detected on this transthoracic study. A transesophageal echocardiogram is recommended to exclude cardiac source of embolism if clinically indicated. FINDINGS  Left Ventricle: Left ventricular ejection fraction, by estimation, is 60 to 65%. The left ventricle has normal function. The left ventricle has no regional wall motion abnormalities. The left ventricular internal cavity size was normal in size. There is  moderate left ventricular hypertrophy. Left ventricular diastolic parameters are consistent with Grade I diastolic dysfunction (impaired relaxation). Right Ventricle: The right ventricular size is normal. No increase in right ventricular wall thickness. Right ventricular systolic function is normal. Left Atrium: Left atrial size was normal in size. Right Atrium: Right atrial size was normal in size. Pericardium: There is no evidence of pericardial effusion. Mitral Valve: The mitral valve is normal in structure. Normal mobility of the mitral valve leaflets. No evidence of mitral valve regurgitation. No evidence of mitral valve stenosis. Tricuspid Valve: The tricuspid valve is normal in structure. Tricuspid valve regurgitation is trivial. No evidence of tricuspid stenosis. Aortic Valve: The aortic valve is normal in structure. Aortic valve regurgitation is not visualized. No aortic stenosis is present. Pulmonic Valve: The pulmonic valve was normal in structure. Pulmonic valve regurgitation is not visualized. No evidence of pulmonic stenosis. Aorta: The aortic root is normal in size and structure. Venous: The inferior vena cava is normal in size with greater than 50% respiratory variability, suggesting right atrial pressure of 3 mmHg. IAS/Shunts: No atrial level shunt detected by color flow Doppler.  LEFT VENTRICLE PLAX 2D LVIDd:          4.55 cm  Diastology LVIDs:         2.60 cm  LV e' lateral:   7.29 cm/s LV PW:         1.81 cm  LV E/e' lateral: 7.4 LV IVS:        1.50 cm  LV e' medial:    9.57 cm/s LVOT diam:     2.20 cm  LV E/e' medial:  5.7 LV SV:         110 LV SV Index:   46 LVOT Area:     3.80 cm  RIGHT VENTRICLE RV S prime:     12.60 cm/s TAPSE (M-mode): 2.3 cm LEFT ATRIUM           Index       RIGHT ATRIUM           Index LA diam:      3.50 cm 1.48 cm/m  RA Area:     20.80 cm LA Vol (A2C): 74.8 ml 31.54 ml/m RA Volume:   57.20 ml  24.12 ml/m LA Vol (A4C): 70.7 ml 29.81 ml/m  AORTIC VALVE LVOT Vmax:   123.00 cm/s LVOT Vmean:  84.200 cm/s LVOT VTI:    0.290 m  AORTA Ao Root diam: 3.30 cm MITRAL VALVE MV Area (PHT): 2.76 cm    SHUNTS MV Decel Time: 275 msec    Systemic VTI:  0.29 m MV E  velocity: 54.30 cm/s  Systemic Diam: 2.20 cm MV A velocity: 72.80 cm/s MV E/A ratio:  0.75 Candee Furbish MD Electronically signed by Candee Furbish MD Signature Date/Time: 12/18/2019/3:24:52 PM    Final     Scheduled Meds: .  stroke: mapping our early stages of recovery book   Does not apply Once  . [START ON 12/20/2019] amLODipine  10 mg Oral Daily  . aspirin  300 mg Rectal Daily   Or  . aspirin  325 mg Oral Daily  . carvedilol  12.5 mg Oral BID WC  . docusate sodium  100 mg Oral BID  . doxazosin  2 mg Oral Q supper  . enoxaparin (LOVENOX) injection  40 mg Subcutaneous Q24H  . losartan  100 mg Oral Daily   Or  . hydrochlorothiazide  25 mg Oral Daily  . insulin aspart  0-15 Units Subcutaneous TID WC  . insulin aspart  0-5 Units Subcutaneous QHS  . ketorolac  1 drop Both Eyes QID  . prednisoLONE acetate  1 drop Both Eyes QID   Continuous Infusions: . sodium chloride 50 mL/hr at 12/19/19 1105     LOS: 0 days   Time spent: 35 minutes   Darliss Cheney, MD [facility]  12/19/2019, 2:15 PM   To contact the attending provider between 7A-7P or the covering provider during after hours 7P-7A, please log into the web site  [email or web address].

## 2019-12-20 DIAGNOSIS — I169 Hypertensive crisis, unspecified: Secondary | ICD-10-CM | POA: Diagnosis not present

## 2019-12-20 LAB — CBC WITH DIFFERENTIAL/PLATELET
Abs Immature Granulocytes: 0.01 10*3/uL (ref 0.00–0.07)
Basophils Absolute: 0 10*3/uL (ref 0.0–0.1)
Basophils Relative: 1 %
Eosinophils Absolute: 0.1 10*3/uL (ref 0.0–0.5)
Eosinophils Relative: 2 %
HCT: 42.3 % (ref 39.0–52.0)
Hemoglobin: 13.9 g/dL (ref 13.0–17.0)
Immature Granulocytes: 0 %
Lymphocytes Relative: 43 %
Lymphs Abs: 1.4 10*3/uL (ref 0.7–4.0)
MCH: 27.9 pg (ref 26.0–34.0)
MCHC: 32.9 g/dL (ref 30.0–36.0)
MCV: 84.8 fL (ref 80.0–100.0)
Monocytes Absolute: 0.4 10*3/uL (ref 0.1–1.0)
Monocytes Relative: 13 %
Neutro Abs: 1.4 10*3/uL — ABNORMAL LOW (ref 1.7–7.7)
Neutrophils Relative %: 41 %
Platelets: 152 10*3/uL (ref 150–400)
RBC: 4.99 MIL/uL (ref 4.22–5.81)
RDW: 14.6 % (ref 11.5–15.5)
WBC: 3.3 10*3/uL — ABNORMAL LOW (ref 4.0–10.5)
nRBC: 0 % (ref 0.0–0.2)

## 2019-12-20 LAB — BASIC METABOLIC PANEL
Anion gap: 11 (ref 5–15)
BUN: 16 mg/dL (ref 8–23)
CO2: 25 mmol/L (ref 22–32)
Calcium: 9.3 mg/dL (ref 8.9–10.3)
Chloride: 101 mmol/L (ref 98–111)
Creatinine, Ser: 1.08 mg/dL (ref 0.61–1.24)
GFR calc Af Amer: >60 mL/min (ref >60)
GFR calc non Af Amer: >60 mL/min (ref >60)
Glucose, Bld: 273 mg/dL — ABNORMAL HIGH (ref 70–99)
Potassium: 3.8 mmol/L (ref 3.5–5.1)
Sodium: 137 mmol/L (ref 135–145)

## 2019-12-20 LAB — GLUCOSE, CAPILLARY: Glucose-Capillary: 152 mg/dL — ABNORMAL HIGH (ref 70–99)

## 2019-12-20 MED ORDER — AMLODIPINE BESYLATE 10 MG PO TABS: 10.0000 mg | ORAL_TABLET | Freq: Every day | ORAL | 0 refills | Status: DC

## 2019-12-20 MED ORDER — DOXAZOSIN MESYLATE 4 MG PO TABS: 2.0000 mg | ORAL_TABLET | Freq: Every day | ORAL | Status: DC

## 2019-12-20 MED FILL — AMLODIPINE BESYLATE 10 MG T: 10 | 30 days supply | Qty: 30 | Fill #0

## 2019-12-20 NOTE — Discharge Summary (Signed)
Physician Discharge Summary  Marcus Johnston [id number] DOB: 25-Jun-1931 DOA: 12/17/2019  PCP: Glendale Chard, MD  Admit date: 12/17/2019 Discharge date: 12/20/2019  Admitted From: Home Discharge disposition: Home   Recommendations for Outpatient Follow-Up:   1. Continue titration of blood pressure medications as well as insulin   Discharge Diagnosis:   Principal Problem:   Hypertensive crisis Active Problems:   Essential hypertension   Dyslipidemia   Type 2 diabetes mellitus with other circulatory complications (HCC)   Malignant hypertension    Discharge Condition: Improved.  Diet recommendation: Low sodium, heart healthy.  Carbohydrate-modified.  Regular.  Wound care: None.  Code status: DNR.   History of Present Illness:   Marcus B Baxteris a 86 y.o.malewith medical history significant ofHTN; HLD; DM; afib s/p ablation and not on AC; and h/o prostate CA presenting with fatigue.He reports that he was sitting last night and when he attempted to talk he was stuttering and couldn't get his words out. He knew what he wanted to say, felt ok mentally. His wife thought he was a bit confused. Symptoms lasted about 5 minutes and resolved completely. No prior h/o similar. He had an afib ablation last September. He has "always had" an irregular heart beat but this has not continued since the ablation. He did not notice cardiac symptoms yesterday. He felt well yesterday before this event.  He fell and hit his head in the past and he "went through these things to fix that." He had a routine cards visit after that and his BP was high and he was given a pill. He was left alone in the room and he passed out in the chair and awakened in the chair. This just happened a couple of weeks ago and he has had no problems since, until now.   He has been having problems with elevated BP. He has been taking pills. He bought a new BP meter and the last reading was  157/103. It has been running high. It has been "under 200, mainly."     Hospital Course by Problem:   Hypertensive crisis/malignant hypertension: (TIA doubted by admitting MD) -Patient presenting with transient aphasia (resolved prior to arriving to ED) and markedly elevated BP, concerning for hypertensive crisis.  MRI brain negative for any acute stroke.  Patient had MR angiogram head and neck in February this year which was unremarkable so there is no indication to repeat vascular study.   His home BP medications were resumed in the ER with improved BP control -Pressure markedly improved with titration of meds -Of note, at recent cardiology clinic visit (2/8) he was given a one-time dose of Bidil that caused a hypotensive response (200/100 -> 68/54) with associated apnea; he was sent to the ER for evaluation of this exaggerated BP response and associated global hypoperfusion and possible vasovagal episode (cardiologist also made mention of whitecoat hypertension)  AKI on CKD stage IIIa: Likely secondary to hypertensive crisis.  Now back to baseline.  Atrial tachycardia -s/p ablation in 04/2019 -No recurrence since - no a fib on tele  HLD -LDL < 70 in 03/2019 -He does not appear to be taking medication for this issue at this time.  He is allergic to statins.  Type 2 diabetes mellitus: Hemoglobin A1c 8.   -Resume home meds  Obesity Estimated body mass index is 30.01 kg/m as calculated from the following:   Height as of this encounter: 6\' 3"  (1.905 m).   Weight as of this  encounter: 108.9 kg.    Medical Consultants:   Neuro (telephone)   Discharge Exam:   Vitals:   12/20/19 0933 12/20/19 1053  BP: (!) 181/101 138/88  Pulse: 67 70  Resp: 20 18  Temp: 97.7 F (36.5 C)   SpO2: 100% 100%   Vitals:   12/20/19 0616 12/20/19 0729 12/20/19 0933 12/20/19 1053  BP: (!) 181/90 (!) 175/73 (!) 181/101 138/88  Pulse: 63 68 67 70  Resp: 16 18 20 18   Temp: 97.6 F (36.4  C) 97.6 F (36.4 C) 97.7 F (36.5 C)   TempSrc: Oral Oral Oral   SpO2: 98% 95% 100% 100%  Weight:      Height:        General exam: Appears calm and comfortable.  Sitting in chair with pants and shoes on  The results of significant diagnostics from this hospitalization (including imaging, microbiology, ancillary and laboratory) are listed below for reference.     Procedures and Diagnostic Studies:   MR BRAIN WO CONTRAST  Result Date: 12/18/2019 CLINICAL DATA:  Neuro deficit, subacute. EXAM: MRI HEAD WITHOUT CONTRAST TECHNIQUE: Multiplanar, multiecho pulse sequences of the brain and surrounding structures were obtained without intravenous contrast. COMPARISON:  MRI/MRA head 09/25/2019. FINDINGS: Brain: Stable appearance of moderate scattered T2/FLAIR hyperintensity within the cerebral white matter which is nonspecific, but consistent with chronic small vessel ischemic disease. Redemonstrated chronic right thalamic lacunar infarct. Stable, mild generalized parenchymal atrophy. Nonspecific chronic microhemorrhage within the left frontal lobe (series 7, image 69). There is no acute infarct. No evidence of intracranial mass. No extra-axial fluid collection. No midline shift. Vascular: Expected proximal arterial flow voids. Skull and upper cervical spine: No focal marrow lesion. Incompletely assessed upper cervical spondylosis. Sinuses/Orbits: Bilateral lens replacements. No significant paranasal sinus disease or mastoid effusion at the imaged levels. IMPRESSION: 1. No evidence of acute intracranial abnormality, including acute infarction. 2. Mild generalized parenchymal atrophy and moderate chronic small vessel ischemic disease, stable. 3. Redemonstrated chronic right thalamic lacunar infarct. Electronically Signed   By: Kellie Simmering DO   On: 12/18/2019 08:29   ECHOCARDIOGRAM COMPLETE  Result Date: 12/18/2019    ECHOCARDIOGRAM REPORT   Patient Name:   Marcus Johnston Date of Exam: 12/18/2019 Medical  Rec #:  [id number]        Height:       75.0 in Accession #:    [id number]       Weight:       240.1 lb Date of Birth:  02/18/1931         BSA:          2.372 m Patient Age:    69 years         BP:           171/87 mmHg Patient Gender: M                HR:           70 bpm. Exam Location:  Inpatient Procedure: 2D Echo, Color Doppler and Cardiac Doppler Indications:    TIA  History:        Patient has prior history of Echocardiogram examinations, most                 recent 01/22/2019. Risk Factors:Hypertension, Diabetes and                 Dyslipidemia.  Sonographer:    Raquel Sarna Senior RDCS Referring Phys: Gutierrez  Sonographer Comments: Technically  difficult windows. IMPRESSIONS  1. Left ventricular ejection fraction, by estimation, is 60 to 65%. The left ventricle has normal function. The left ventricle has no regional wall motion abnormalities. There is moderate left ventricular hypertrophy. Left ventricular diastolic parameters are consistent with Grade I diastolic dysfunction (impaired relaxation).  2. Right ventricular systolic function is normal. The right ventricular size is normal.  3. The mitral valve is normal in structure. No evidence of mitral valve regurgitation. No evidence of mitral stenosis.  4. The aortic valve is normal in structure. Aortic valve regurgitation is not visualized. No aortic stenosis is present.  5. The inferior vena cava is normal in size with greater than 50% respiratory variability, suggesting right atrial pressure of 3 mmHg. Comparison(s): No significant change from prior study. Prior images reviewed side by side. Conclusion(s)/Recommendation(s): No intracardiac source of embolism detected on this transthoracic study. A transesophageal echocardiogram is recommended to exclude cardiac source of embolism if clinically indicated. FINDINGS  Left Ventricle: Left ventricular ejection fraction, by estimation, is 60 to 65%. The left ventricle has normal function. The left ventricle  has no regional wall motion abnormalities. The left ventricular internal cavity size was normal in size. There is  moderate left ventricular hypertrophy. Left ventricular diastolic parameters are consistent with Grade I diastolic dysfunction (impaired relaxation). Right Ventricle: The right ventricular size is normal. No increase in right ventricular wall thickness. Right ventricular systolic function is normal. Left Atrium: Left atrial size was normal in size. Right Atrium: Right atrial size was normal in size. Pericardium: There is no evidence of pericardial effusion. Mitral Valve: The mitral valve is normal in structure. Normal mobility of the mitral valve leaflets. No evidence of mitral valve regurgitation. No evidence of mitral valve stenosis. Tricuspid Valve: The tricuspid valve is normal in structure. Tricuspid valve regurgitation is trivial. No evidence of tricuspid stenosis. Aortic Valve: The aortic valve is normal in structure. Aortic valve regurgitation is not visualized. No aortic stenosis is present. Pulmonic Valve: The pulmonic valve was normal in structure. Pulmonic valve regurgitation is not visualized. No evidence of pulmonic stenosis. Aorta: The aortic root is normal in size and structure. Venous: The inferior vena cava is normal in size with greater than 50% respiratory variability, suggesting right atrial pressure of 3 mmHg. IAS/Shunts: No atrial level shunt detected by color flow Doppler.  LEFT VENTRICLE PLAX 2D LVIDd:         4.55 cm  Diastology LVIDs:         2.60 cm  LV e' lateral:   7.29 cm/s LV PW:         1.81 cm  LV E/e' lateral: 7.4 LV IVS:        1.50 cm  LV e' medial:    9.57 cm/s LVOT diam:     2.20 cm  LV E/e' medial:  5.7 LV SV:         110 LV SV Index:   46 LVOT Area:     3.80 cm  RIGHT VENTRICLE RV S prime:     12.60 cm/s TAPSE (M-mode): 2.3 cm LEFT ATRIUM           Index       RIGHT ATRIUM           Index LA diam:      3.50 cm 1.48 cm/m  RA Area:     20.80 cm LA Vol (A2C):  74.8 ml 31.54 ml/m RA Volume:   57.20 ml  24.12 ml/m LA Vol (  A4C): 70.7 ml 29.81 ml/m  AORTIC VALVE LVOT Vmax:   123.00 cm/s LVOT Vmean:  84.200 cm/s LVOT VTI:    0.290 m  AORTA Ao Root diam: 3.30 cm MITRAL VALVE MV Area (PHT): 2.76 cm    SHUNTS MV Decel Time: 275 msec    Systemic VTI:  0.29 m MV E velocity: 54.30 cm/s  Systemic Diam: 2.20 cm MV A velocity: 72.80 cm/s MV E/A ratio:  0.75 Candee Furbish MD Electronically signed by Candee Furbish MD Signature Date/Time: 12/18/2019/3:24:52 PM    Final      Labs:   Basic Metabolic Panel: Recent Labs  Lab 12/17/19 2136 12/17/19 2136 12/19/19 0845 12/20/19 0750  NA 139  --  134* 137  K 4.3   < > 4.0 3.8  CL 107  --  103 101  CO2 23  --  22 25  GLUCOSE 97  --  329* 273*  BUN 23  --  18 16  CREATININE 1.65*  --  1.17 1.08  CALCIUM 9.2  --  9.0 9.3   < > = values in this interval not displayed.   GFR Estimated Creatinine Clearance: 65.5 mL/min (by C-G formula based on SCr of 1.08 mg/dL). Liver Function Tests: Recent Labs  Lab 12/18/19 0650  AST 16  ALT 12  ALKPHOS 67  BILITOT 0.6  PROT 6.6  ALBUMIN 3.6   No results for input(s): LIPASE, AMYLASE in the last 168 hours. No results for input(s): AMMONIA in the last 168 hours. Coagulation profile No results for input(s): INR, PROTIME in the last 168 hours.  CBC: Recent Labs  Lab 12/17/19 2136 12/19/19 0845 12/20/19 0750  WBC 4.6 3.6* 3.3*  NEUTROABS  --  1.8 1.4*  HGB 13.7 13.5 13.9  HCT 46.7 41.8 42.3  MCV 94.3 85.3 84.8  PLT 202 177 152   Cardiac Enzymes: No results for input(s): CKTOTAL, CKMB, CKMBINDEX, TROPONINI in the last 168 hours. BNP: Invalid input(s): POCBNP CBG: Recent Labs  Lab 12/19/19 0535 12/19/19 1106 12/19/19 1630 12/19/19 2151 12/20/19 0618  GLUCAP 184* 246* 195* 229* 152*   D-Dimer No results for input(s): DDIMER in the last 72 hours. Hgb A1c Recent Labs    12/18/19 1034  HGBA1C 8.0*   Lipid Profile Recent Labs    12/19/19 0135  CHOL  170  HDL 62  LDLCALC 95  TRIG 63  CHOLHDL 2.7   Thyroid function studies No results for input(s): TSH, T4TOTAL, T3FREE, THYROIDAB in the last 72 hours.  Invalid input(s): FREET3 Anemia work up No results for input(s): VITAMINB12, FOLATE, FERRITIN, TIBC, IRON, RETICCTPCT in the last 72 hours. Microbiology Recent Results (from the past 240 hour(s))  Respiratory Panel by RT PCR (Flu A&B, Covid) - Nasopharyngeal Swab     Status: None   Collection Time: 12/18/19  8:56 AM   Specimen: Nasopharyngeal Swab  Result Value Ref Range Status   SARS Coronavirus 2 by RT PCR NEGATIVE NEGATIVE Final    Comment: (NOTE) SARS-CoV-2 target nucleic acids are NOT DETECTED. The SARS-CoV-2 RNA is generally detectable in upper respiratoy specimens during the acute phase of infection. The lowest concentration of SARS-CoV-2 viral copies this assay can detect is 131 copies/mL. A negative result does not preclude SARS-Cov-2 infection and should not be used as the sole basis for treatment or other patient management decisions. A negative result may occur with  improper specimen collection/handling, submission of specimen other than nasopharyngeal swab, presence of viral mutation(s) within the areas targeted by  this assay, and inadequate number of viral copies (<131 copies/mL). A negative result must be combined with clinical observations, patient history, and epidemiological information. The expected result is Negative. Fact Sheet for Patients:  PinkCheek.be Fact Sheet for Healthcare Providers:  GravelBags.it This test is not yet ap proved or cleared by the [place] FDA and  has been authorized for detection and/or diagnosis of SARS-CoV-2 by FDA under an Emergency Use Authorization (EUA). This EUA will remain  in effect (meaning this test can be used) for the duration of the COVID-19 declaration under Section 564(b)(1) of the Act, 21  U.S.C. section 360bbb-3(b)(1), unless the authorization is terminated or revoked sooner.    Influenza A by PCR NEGATIVE NEGATIVE Final   Influenza B by PCR NEGATIVE NEGATIVE Final    Comment: (NOTE) The Xpert Xpress SARS-CoV-2/FLU/RSV assay is intended as an aid in  the diagnosis of influenza from Nasopharyngeal swab specimens and  should not be used as a sole basis for treatment. Nasal washings and  aspirates are unacceptable for Xpert Xpress SARS-CoV-2/FLU/RSV  testing. Fact Sheet for Patients: PinkCheek.be Fact Sheet for Healthcare Providers: GravelBags.it This test is not yet approved or cleared by the [place] FDA and  has been authorized for detection and/or diagnosis of SARS-CoV-2 by  FDA under an Emergency Use Authorization (EUA). This EUA will remain  in effect (meaning this test can be used) for the duration of the  Covid-19 declaration under Section 564(b)(1) of the Act, 21  U.S.C. section 360bbb-3(b)(1), unless the authorization is  terminated or revoked. Performed at [facility], [place] [address] [place], [place] [address]      Discharge Instructions:   Discharge Instructions    Diet - low sodium heart healthy   Complete by: As directed    Diet Carb Modified   Complete by: As directed    Increase activity slowly   Complete by: As directed      Allergies as of 12/20/2019      Reactions   Bidil [isosorb Dinitrate-hydralazine] Other (See Comments)   Severe hypotension episode after administering one dose of Bidil 20-37.5 mg on 09/25/2019.    Statins Other (See Comments)   Leg pain      Medication List    TAKE these medications   acetaminophen 325 MG tablet Commonly known as: TYLENOL Take 650 mg by mouth every 6 (six) hours as needed for mild pain or fever.   amLODipine 10 MG tablet Commonly known as: NORVASC Take 1 tablet (10 mg total) by mouth daily. Notes to patient: 12/21/19    carvedilol 12.5 MG tablet Commonly known as: COREG Take 1 tablet (12.5 mg total) by mouth 2 (two) times daily with a meal. Notes to patient: 12/20/19   doxazosin 4 MG tablet Commonly known as: CARDURA Take 0.5 tablets (2 mg total) by mouth daily with supper. Notes to patient: 12/20/19   fexofenadine 180 MG tablet Commonly known as: ALLEGRA Take 180 mg by mouth daily as needed (allergies.).   insulin lispro protamine-lispro (50-50) 100 UNIT/ML Susp injection Commonly known as: HUMALOG 50/50 MIX Inject 22-25 Units into the skin See admin instructions. Inject 25 units subcutaneously with breakfast and 22 units with supper Notes to patient: Continue home schedule   Insulin Syringe-Needle U-100 30G X 5/16" 0.5 ML Misc Commonly known as: Easy Touch Insulin Syringe USE 3 TIMES DAILY Notes to patient: Continue home schedule   losartan-hydrochlorothiazide 100-25 MG tablet Commonly known as: HYZAAR TAKE 1 TABLET BY MOUTH  DAILY What changed: when to take this Notes to patient: Continue home schedule   metFORMIN 500 MG 24 hr tablet Commonly known as: GLUCOPHAGE-XR Take 1,000 mg by mouth 2 (two) times daily with a meal. Notes to patient: Continue home schedule   prednisoLONE acetate 1 % ophthalmic suspension Commonly known as: PRED FORTE Place 1 drop into both eyes 4 (four) times daily. Notes to patient: 12/20/19   Prolensa 0.07 % Soln Generic drug: Bromfenac Sodium Place 1 drop into both eyes 4 (four) times daily. Notes to patient: Continue home schedule   Vitamin D3 25 MCG (1000 UT) Caps Take 1,000 Units by mouth daily. Notes to patient: Continue home schedule         Time coordinating discharge: 25 min  Signed:  Geradine Girt DO  [facility] 12/20/2019, 11:19 AM

## 2019-12-20 NOTE — Progress Notes (Signed)
Nsg Discharge Note  Patient to be discharged home. TOC dropped off medication. Discharge instructions provided. Patient waiting for wife to provide transportation.  Admit Date:  12/17/2019 Discharge date: 12/20/2019   Khizar B Odowd to be D/C'd Home per MD order.  AVS completed.  Copy for chart, and copy for patient signed, and dated. Patient/caregiver able to verbalize understanding.  Discharge Medication: Allergies as of 12/20/2019      Reactions   Bidil [isosorb Dinitrate-hydralazine] Other (See Comments)   Severe hypotension episode after administering one dose of Bidil 20-37.5 mg on 09/25/2019.    Statins Other (See Comments)   Leg pain      Medication List    TAKE these medications   acetaminophen 325 MG tablet Commonly known as: TYLENOL Take 650 mg by mouth every 6 (six) hours as needed for mild pain or fever.   amLODipine 10 MG tablet Commonly known as: NORVASC Take 1 tablet (10 mg total) by mouth daily. Notes to patient: 12/21/19   carvedilol 12.5 MG tablet Commonly known as: COREG Take 1 tablet (12.5 mg total) by mouth 2 (two) times daily with a meal. Notes to patient: 12/20/19   doxazosin 4 MG tablet Commonly known as: CARDURA Take 0.5 tablets (2 mg total) by mouth daily with supper. Notes to patient: 12/20/19   fexofenadine 180 MG tablet Commonly known as: ALLEGRA Take 180 mg by mouth daily as needed (allergies.).   insulin lispro protamine-lispro (50-50) 100 UNIT/ML Susp injection Commonly known as: HUMALOG 50/50 MIX Inject 22-25 Units into the skin See admin instructions. Inject 25 units subcutaneously with breakfast and 22 units with supper Notes to patient: Continue home schedule   Insulin Syringe-Needle U-100 30G X 5/16" 0.5 ML Misc Commonly known as: Easy Touch Insulin Syringe USE 3 TIMES DAILY Notes to patient: Continue home schedule   losartan-hydrochlorothiazide 100-25 MG tablet Commonly known as: HYZAAR TAKE 1 TABLET BY MOUTH  DAILY What changed:  when to take this Notes to patient: Continue home schedule   metFORMIN 500 MG 24 hr tablet Commonly known as: GLUCOPHAGE-XR Take 1,000 mg by mouth 2 (two) times daily with a meal. Notes to patient: Continue home schedule   prednisoLONE acetate 1 % ophthalmic suspension Commonly known as: PRED FORTE Place 1 drop into both eyes 4 (four) times daily. Notes to patient: 12/20/19   Prolensa 0.07 % Soln Generic drug: Bromfenac Sodium Place 1 drop into both eyes 4 (four) times daily. Notes to patient: Continue home schedule   Vitamin D3 25 MCG (1000 UT) Caps Take 1,000 Units by mouth daily. Notes to patient: Continue home schedule       Discharge Assessment: Vitals:   12/20/19 0933 12/20/19 1053  BP: (!) 181/101 138/88  Pulse: 67 70  Resp: 20 18  Temp: 97.7 F (36.5 C)   SpO2: 100% 100%   Skin clean, dry and intact without evidence of skin break down, no evidence of skin tears noted. IV catheter discontinued intact. Site without signs and symptoms of complications - no redness or edema noted at insertion site, patient denies c/o pain - only slight tenderness at site.  Dressing with slight pressure applied.  D/c Instructions-Education: Discharge instructions given to patient/family with verbalized understanding. D/c education completed with patient/family including follow up instructions, medication list, d/c activities limitations if indicated, with other d/c instructions as indicated by MD - patient able to verbalize understanding, all questions fully answered. Patient instructed to return to ED, call 911, or call MD for any  changes in condition.  Patient escorted via [place], and D/C home via private auto.  Erasmo Leventhal, RN 12/20/2019 11:32 AM

## 2019-12-21 ENCOUNTER — Telehealth: Payer: Self-pay

## 2019-12-21 NOTE — Progress Notes (Signed)
[facility] Note  12/28/2019     CHIEF COMPLAINT Patient presents for Retina Follow Up   HISTORY OF PRESENT ILLNESS: Marcus Johnston is a 86 y.o. male who presents to the clinic today for:   HPI    Retina Follow Up    Patient presents with  Diabetic Retinopathy.  In both eyes.  Severity is moderate.  Duration of 4 weeks.  Since onset it is stable.  I, the attending physician,  performed the HPI with the patient and updated documentation appropriately.          Comments    4 week retina follow up- Vision appears stable since last visit OU. Patient denies any new problems. Patient was in the hospital 5/2-5th for speech problems, denies stroke, possible BP problems.  Amlodipine added. BS: 150 this am A1C: 7.3       Last edited by [name], MD on 12/28/2019  8:46 PM. (History)    Patient    Referring physician: Glendale Chard, MD [address] [place],  [place] [address]  HISTORICAL INFORMATION:   Selected notes from the MEDICAL RECORD NUMBER Referred by Dr. [name] for CME OU LEE: 12.01.20 (S. Groat) [BCVA: OD: 20/60-- OS: 20/150+] Ocular Hx-glc suspect (former pt of Dr. Sammie Bench / Dr. Venetia Maxon) PMH-A fib, DM (A1c: 7.9 [09.13.20] takes metformin .and humalog), prostate cancer, HTN, HLD   CURRENT MEDICATIONS: Current Outpatient Medications (Ophthalmic Drugs)  Medication Sig  . Bromfenac Sodium (PROLENSA) 0.07 % SOLN Place 1 drop into both eyes 4 (four) times daily.  . prednisoLONE acetate (PRED FORTE) 1 % ophthalmic suspension Place 1 drop into both eyes 4 (four) times daily.   No current facility-administered medications for this visit. (Ophthalmic Drugs)   Current Outpatient Medications (Other)  Medication Sig  . acetaminophen (TYLENOL) 325 MG tablet Take 650 mg by mouth every 6 (six) hours as needed for mild pain or fever.  Marland Kitchen amLODipine (NORVASC) 10 MG tablet Take 1 tablet (10 mg total) by mouth daily.  . carvedilol  (COREG) 12.5 MG tablet Take 1 tablet (12.5 mg total) by mouth 2 (two) times daily with a meal.  . Cholecalciferol (VITAMIN D3) 25 MCG (1000 UT) CAPS Take 1,000 Units by mouth daily.   Marland Kitchen doxazosin (CARDURA) 4 MG tablet Take 0.5 tablets (2 mg total) by mouth daily with supper.  . fexofenadine (ALLEGRA) 180 MG tablet Take 180 mg by mouth daily as needed (allergies.).   Marland Kitchen insulin lispro protamine-lispro (HUMALOG 50/50 MIX) (50-50) 100 UNIT/ML SUSP injection Inject 22-25 Units into the skin See admin instructions. Inject 25 units subcutaneously with breakfast and 22 units with supper  . Insulin Syringe-Needle U-100 (EASY TOUCH INSULIN SYRINGE) 30G X 5/16" 0.5 ML MISC USE 3 TIMES DAILY  . losartan-hydrochlorothiazide (HYZAAR) 100-25 MG tablet TAKE 1 TABLET BY MOUTH  DAILY (Patient taking differently: Take 1 tablet by mouth daily with breakfast. )  . metFORMIN (GLUCOPHAGE-XR) 500 MG 24 hr tablet Take 1,000 mg by mouth 2 (two) times daily with a meal.    No current facility-administered medications for this visit. (Other)      REVIEW OF SYSTEMS: ROS    Positive for: Neurological, Genitourinary, Endocrine, Cardiovascular, Eyes, Respiratory   Negative for: Constitutional, Gastrointestinal, Skin, Musculoskeletal, HENT, Psychiatric, Allergic/Imm, Heme/Lymph   Last edited by Leonie Douglas, COA on 12/28/2019  9:39 AM. (History)       ALLERGIES Allergies  Allergen Reactions  . Bidil [Isosorb Dinitrate-Hydralazine]  Other (See Comments)    Severe hypotension episode after administering one dose of Bidil 20-37.5 mg on 09/25/2019.   . Statins Other (See Comments)    Leg pain    PAST MEDICAL HISTORY Past Medical History:  Diagnosis Date  . Atrial fibrillation ([address])   . Atypical atrial flutter ([place]) 10/17/2018  . Diabetes mellitus without complication ([place])   . Diabetic retinopathy ([place])    NPDR OU  . High cholesterol   . History of prostate cancer   . HTN (hypertension)   . Hypertensive retinopathy     OU  . Renal insufficiency   . Shingles    Past Surgical History:  Procedure Laterality Date  . ATRIAL TACH ABLATION N/A 05/08/2019   Procedure: ATRIAL TACH ABLATION;  Surgeon: [name], MD;  Location: [facility];  Service: Cardiovascular;  Laterality: N/A;  . CATARACT EXTRACTION Bilateral   . EYE SURGERY Bilateral    Cat Sx  . prostate implant      FAMILY HISTORY Family History  Problem Relation Age of Onset  . Cancer Mother   . Cancer Father     SOCIAL HISTORY Social History   Tobacco Use  . Smoking status: Never Smoker  . Smokeless tobacco: Never Used  Substance Use Topics  . Alcohol use: No  . Drug use: No         OPHTHALMIC EXAM:  Base Eye Exam    Visual Acuity (Snellen - Linear)      Right Left   Dist cc 20/40 20/70 +2   Dist ph cc NI NI   Correction: Glasses       Tonometry (Tonopen, 9:44 AM)      Right Left   Pressure 15 16       Pupils      Dark Light Shape React APD   Right 3 2 Round Brisk None   Left 3 2 Round Brisk None       Visual Fields (Counting fingers)      Left Right    Full Full       Extraocular Movement      Right Left    Full Full       Neuro/Psych    Oriented x3: Yes   Mood/Affect: Normal       Dilation    Both eyes: 1.0% Mydriacyl, 2.5% Phenylephrine @ 9:45 AM        Slit Lamp and Fundus Exam    Slit Lamp Exam      Right Left   Lids/Lashes Dermatochalasis - upper lid, mild Meibomian gland dysfunction Dermatochalasis - upper lid, mild Meibomian gland dysfunction   Conjunctiva/Sclera Mild Melanosis Mild Melanosis   Cornea Trace Punctate epithelial erosions 1+ Punctate epithelial erosions. endopigment   Anterior Chamber Deep and quiet Deep and quiet   Iris Round and dilated, No NVI Round and dilated, No NVI   Lens Toric PC IOL with marks at 1200 and 0600 PC IOL in good position with PC folds   Vitreous Vitreous syneresis, PVD and vitreous condensations, no residual IVTA Vitreous syneresis        Fundus Exam      Right Left   Disc Mild Pallor, mild, temporal Peripapillary atrophy, Sharp rim temporal Peripapillary atrophy, +cupping, mild Pallor, Sharp rim   C/D Ratio 0.6 0.65   Macula Blunted foveal reflex, central IRH -- persistent, pigment clumping, Microaneurysms, +central cystic changes--improved Blunted foveal reflex, Retinal pigment epithelial mottling and clumping, focal Exudates, +IRH, +  central Cystic changes -- slightly improved, scattered Microaneurysms   Vessels Vascular attenuation, Tortuous Vascular attenuation, mild Copper wiring, AV crossing changes   Periphery Attached, large HST at 0500 with surrounding SRF, +pigmented barrier laser scar from 0430-0600    Attached, small pigmented operculum at 0730 midzone, no SRF             IMAGING AND PROCEDURES  Imaging and Procedures for @TODAY @  OCT, Retina - OU - Both Eyes       Right Eye Quality was good. Central Foveal Thickness: 376. Progression has improved. Findings include abnormal foveal contour, intraretinal fluid, no SRF, intraretinal hyper-reflective material (Interval improvement in IRF; Trace ERM, vitreous opacities).   Left Eye Quality was good. Central Foveal Thickness: 313. Progression has been stable. Findings include intraretinal fluid, abnormal foveal contour, intraretinal hyper-reflective material, no SRF, outer retinal atrophy (Mild ERM; persistent IRF -- mild interval improvement).   Notes *Images captured and stored on drive  Diagnosis / Impression:  OD: Interval improvement in IRF; Trace ERM, vitreous opacities OS: Mild ERM; persistent IRF -- mild interval improvement  Clinical management:  See below  Abbreviations: NFP - Normal foveal profile. CME - cystoid macular edema. PED - pigment epithelial detachment. IRF - intraretinal fluid. SRF - subretinal fluid. EZ - ellipsoid zone. ERM - epiretinal membrane. ORA - outer retinal atrophy. ORT - outer retinal tubulation. SRHM - subretinal  hyper-reflective material        Intravitreal Injection, Pharmacologic Agent - OS - Left Eye       Time Out 12/28/2019. 11:38 AM. Confirmed correct patient, procedure, site, and patient consented.   Anesthesia Topical anesthesia was used. Anesthetic medications included Lidocaine 2%, Proparacaine 0.5%.   Procedure Preparation included 5% betadine to ocular surface, eyelid speculum. A 27 gauge needle was used.   Injection:  4 mg Triescence 40mg /mlL injection   NDC: [phone], Lot: [id number], Expiration date: 03/16/2021   Route: Intravitreal, Site: Left Eye, Waste: 0.9 mL  Post-op Post injection exam found visual acuity of at least counting fingers. The patient tolerated the procedure well. There were no complications. The patient received written and verbal post procedure care education.   Notes An AC tap was performed following injection due to elevated IOP using a 30 gauge needle on a syringe with the plunger removed. The needle was placed at the limbus at 5 oclock and approximately 0.07 cc of aqueous was removed from the anterior chamber. Betadine was applied to the tap area before and after the paracentesis was performed. There were no complications. The patient tolerated the procedure well. The IOP was rechecked and was found to be ~10 mmHg by palpation.        Intravitreal Injection, Pharmacologic Agent - OD - Right Eye       Time Out 12/28/2019. 11:39 AM. Confirmed correct patient, procedure, site, and patient consented.   Anesthesia Topical anesthesia was used. Anesthetic medications included Lidocaine 2%, Proparacaine 0.5%.   Procedure Preparation included 5% betadine to ocular surface, eyelid speculum. A (33g) needle was used.   Injection:  2 mg aflibercept Alfonse Flavors) SOLN   NDC: [id number], Lot: [id number], Expiration date: 05/13/2020   Route: Intravitreal, Site: Right Eye, Waste: 0.05 mL  Post-op Post injection exam found visual acuity of at least counting  fingers. The patient tolerated the procedure well. There were no complications. The patient received written and verbal post procedure care education.  ASSESSMENT/PLAN:    ICD-10-CM   1. Moderate nonproliferative diabetic retinopathy of both eyes with macular edema associated with type 2 diabetes mellitus (HCC)  [id number] Intravitreal Injection, Pharmacologic Agent - OS - Left Eye    Intravitreal Injection, Pharmacologic Agent - OD - Right Eye    triamcinolone acetonide (TRIESENCE) 40 MG/ML subtenons injection 4 mg    aflibercept (EYLEA) SOLN 2 mg  2. Retinal edema  H35.81 OCT, Retina - OU - Both Eyes  3. History of retinal detachment  Z86.69   4. Essential hypertension  I10   5. Hypertensive retinopathy of both eyes  H35.033   6. Pseudophakia of both eyes  Z96.1     1,2.  Non-proliferative diabetic retinopathy w/ macular edema OU  - patient with long standing history of macular edema -- was seen by Dr. Zigmund Daniel back in 2013  - has also seen Dr. Baird Cancer, Dr. Posey Pronto, [facility] at [facility]  - last retina f/u in 2019 with Dr. Posey Pronto and at [facility]  - pt has previously received intravitreal injections OU, but does not like them and felt like benefits from injections were minmal  - at initial visit, was using PF and ketorolac QID OU -- pt self d/c  - exam shows scattered/minimal MA but central edema OU  - FA (12.11.20) shows late leaking MA, OS shows capillary nonperfusion, OD with ?petaloid central hyperfluorescence -- ?CME component  - s/p IVA# 1 OD (12.11.20)  - s/p IVE #1 OD (01.20.21), #2 (02.17.21), #3 (03.17.21)  - s/p IVE# 1 OS (12.11.20), #2 (01.20.21), #3 (02.17.21), #4 (03.17.21), #5 (04.15.21), #6 (4.15.21)             - s/p IVTA OD #1 (04.15.21)  - BCVA OD: 20/40; OS: 20/60  - OCT shows mild interval improvement in IRF OD (good response to IVTA); persistent IRF OS   - Eylea4U benefits investigation initiated 12.11.20 -- approved for 2021  -  ?resistance to IVE             - Recommend IVE OD #4 and IVTA OS #1 today, 12/28/19  - pt wishes to proceed  - RBA of procedure discussed, questions answered  - informed consent obtained  - see procedure notes  - Eylea informed consent form signed and scanned on 01.20.21  - IVTA informed consent form signed and scanned on 04.15.20  - cont PF QID OU and add Prolensa QID OU -- sample given in office  - f/u in 4 wks -- DFE/OCT/possible injection  3. History of inferior HST w/ focal RD OD s/p laser retinopexy  - HST at 0500 w/ surrounding SRF  - good barricade laser in place spanning 0430-0600  - stable  4,5. Hypertensive retinopathy OU  - discussed importance of tight BP control  - monitor   6. Pseudophakia OU  - s/p CE/IOL OU  - beautiful surgeries, doing well  - monitor   Ophthalmic Meds Ordered this visit:  Meds ordered this encounter  Medications  . triamcinolone acetonide (TRIESENCE) 40 MG/ML subtenons injection 4 mg  . aflibercept (EYLEA) SOLN 2 mg       Return in about 4 weeks (around 01/25/2020) for 4 wk f/u for mod NPDR OU w/DFE & OCT.  There are no Patient Instructions on file for this visit.   Explained the diagnoses, plan, and follow up with the patient and they expressed understanding.  Patient expressed understanding of the importance of proper follow up care.   This document serves  as a record of services personally performed by Gardiner Sleeper, MD, PhD. It was created on their behalf by Leeann Must, [place], a certified ophthalmic assistant. The creation of this record is the provider's dictation and/or activities during the visit.    Electronically signed by: Leeann Must, COA @TODAY @ 9:13 PM   This document serves as a record of services personally performed by Gardiner Sleeper, MD, PhD. It was created on their behalf by Ernest Mallick, OA, an ophthalmic assistant. The creation of this record is the provider's dictation and/or activities during the visit.     Electronically signed by: Ernest Mallick, OA 05.13.2021 9:13 PM   Gardiner Sleeper, M.D., Ph.D. Diseases & Surgery of the Retina and Vitreous [facility]  I have reviewed the above documentation for accuracy and completeness, and I agree with the above. Gardiner Sleeper, M.D., Ph.D. 12/28/19 9:13 PM    Abbreviations: M myopia (nearsighted); A astigmatism; H hyperopia (farsighted); P presbyopia; Mrx spectacle prescription;  CTL contact lenses; OD right eye; OS left eye; OU both eyes  XT exotropia; ET esotropia; PEK punctate epithelial keratitis; PEE punctate epithelial erosions; DES dry eye syndrome; MGD meibomian gland dysfunction; ATs artificial tears; PFAT's preservative free artificial tears; [place] nuclear sclerotic cataract; PSC posterior subcapsular cataract; ERM epi-retinal membrane; PVD posterior vitreous detachment; RD retinal detachment; DM diabetes mellitus; DR diabetic retinopathy; NPDR non-proliferative diabetic retinopathy; PDR proliferative diabetic retinopathy; CSME clinically significant macular edema; DME diabetic macular edema; dbh dot blot hemorrhages; CWS cotton wool spot; POAG primary open angle glaucoma; C/D cup-to-disc ratio; HVF humphrey visual field; GVF goldmann visual field; OCT optical coherence tomography; IOP intraocular pressure; BRVO Branch retinal vein occlusion; CRVO central retinal vein occlusion; CRAO central retinal artery occlusion; BRAO branch retinal artery occlusion; RT retinal tear; SB scleral buckle; PPV pars plana vitrectomy; VH Vitreous hemorrhage; PRP panretinal laser photocoagulation; IVK intravitreal kenalog; VMT vitreomacular traction; MH Macular hole;  NVD neovascularization of the disc; NVE neovascularization elsewhere; AREDS age related eye disease study; ARMD age related macular degeneration; POAG primary open angle glaucoma; EBMD epithelial/anterior basement membrane dystrophy; ACIOL anterior chamber intraocular lens; IOL  intraocular lens; PCIOL posterior chamber intraocular lens; Phaco/IOL phacoemulsification with intraocular lens placement; [place] photorefractive keratectomy; LASIK laser assisted in situ keratomileusis; HTN hypertension; DM diabetes mellitus; COPD chronic obstructive pulmonary disease

## 2019-12-21 NOTE — Telephone Encounter (Signed)
Transition Care Management Follow-up Telephone Call  Date of discharge and from where: 12/[facility]  How have you been since you were released from the hospital? The patient said that he is doing ok.  Any questions or concerns? The patient said that he wanted a referral for a different cardiologist.  Items Reviewed:  Did the pt receive and understand the discharge instructions provided? Yes  Medications obtained and verified?Yes  Any new allergies since your discharge? No  Dietary orders reviewed? Yes A Healthy heart diet.  Do you have support at home? Yes  Other (ie: DME, Home Health, etc) No  Functional Questionnaire: (I = Independent and D = Dependent) ADL's: I  Bathing/Dressing- I   Meal Prep-I  Eating- I  Maintaining continence- I  Transferring/Ambulation- I  Managing Meds-I   Follow up appointments reviewed:    PCP Hospital f/u appt confirmed? Yes Scheduled to see Dr. Baird Cancer on 12/26/2019 at [facility] f/u appt confirmed?N/A  Are transportation arrangements needed? No  If their condition worsens, is the pt aware to call  their PCP or go to the ED? Yes  Was the patient provided with contact information for the PCP's office or ED? Yes  Was the pt encouraged to call back with questions or concerns? Yes

## 2019-12-25 ENCOUNTER — Other Ambulatory Visit: Payer: Self-pay | Admitting: Internal Medicine

## 2019-12-25 DIAGNOSIS — E6609 Other obesity due to excess calories: Secondary | ICD-10-CM

## 2019-12-25 DIAGNOSIS — R351 Nocturia: Secondary | ICD-10-CM

## 2019-12-25 DIAGNOSIS — I495 Sick sinus syndrome: Secondary | ICD-10-CM

## 2019-12-25 DIAGNOSIS — I10 Essential (primary) hypertension: Secondary | ICD-10-CM

## 2019-12-25 DIAGNOSIS — E1159 Type 2 diabetes mellitus with other circulatory complications: Secondary | ICD-10-CM

## 2019-12-26 ENCOUNTER — Ambulatory Visit (INDEPENDENT_AMBULATORY_CARE_PROVIDER_SITE_OTHER): Payer: Medicare Other | Admitting: Internal Medicine

## 2019-12-26 ENCOUNTER — Encounter: Payer: Self-pay | Admitting: Internal Medicine

## 2019-12-26 ENCOUNTER — Other Ambulatory Visit: Payer: Self-pay

## 2019-12-26 VITALS — BP 146/78 | HR 74 | Temp 97.4°F | Ht 75.0 in | Wt 232.6 lb

## 2019-12-26 DIAGNOSIS — Z79899 Other long term (current) drug therapy: Secondary | ICD-10-CM

## 2019-12-26 DIAGNOSIS — E1159 Type 2 diabetes mellitus with other circulatory complications: Secondary | ICD-10-CM | POA: Diagnosis not present

## 2019-12-26 DIAGNOSIS — I1310 Hypertensive heart and chronic kidney disease without heart failure, with stage 1 through stage 4 chronic kidney disease, or unspecified chronic kidney disease: Secondary | ICD-10-CM | POA: Diagnosis not present

## 2019-12-26 DIAGNOSIS — E7800 Pure hypercholesterolemia, unspecified: Secondary | ICD-10-CM

## 2019-12-26 NOTE — Patient Instructions (Addendum)
Please start amlodipine 10mg  1/2 tab twice daily ------ START 5/12  Call with COVID vaccine information

## 2019-12-26 NOTE — Progress Notes (Signed)
This visit occurred during the SARS-CoV-2 public health emergency.  Safety protocols were in place, including screening questions prior to the visit, additional usage of staff PPE, and extensive cleaning of exam room while observing appropriate contact time as indicated for disinfecting solutions.  Subjective:     Patient ID: Marcus Johnston , male    DOB: 1930/08/21 , 86 y.o.   MRN: [id number]   Chief Complaint  Patient presents with  . Hypertension    HPI  He presents today for hospital f/u. He was admitted to [facility] on 5/2 for hypertensive emergency and discharged on 5/5 in stable condition. He presented to ER on 5/2 for further evaluation of transient aphasia. This had resolved by the time he got to the ER. MRI brain was negative for acute stroke. It was decided that MRA did not need to be performed since done in Feb 2021. His pressure gradually improved when his medications were titrated during his hospitalization.  His BP had greatly improved by the time of discharge.  He reports compliance with meds since discharge. He denies chest pain, shortness of breath and palpitations.    Hypertension This is a chronic problem. The current episode started more than 1 year ago. The problem has been gradually improving since onset. The problem is uncontrolled. Pertinent negatives include no blurred vision, chest pain, palpitations or shortness of breath. Risk factors for coronary artery disease include diabetes mellitus, dyslipidemia, male gender and sedentary lifestyle. Past treatments include beta blockers.     Past Medical History:  Diagnosis Date  . Atrial fibrillation ([place])   . Atypical atrial flutter ([place]) 10/17/2018  . Diabetes mellitus without complication ([place])   . Diabetic retinopathy ([place])    NPDR OU  . High cholesterol   . History of prostate cancer   . HTN (hypertension)   . Hypertensive retinopathy    OU  . Renal insufficiency   . Shingles      Family History  Problem  Relation Age of Onset  . Cancer Mother   . Cancer Father      Current Outpatient Medications:  .  acetaminophen (TYLENOL) 325 MG tablet, Take 650 mg by mouth every 6 (six) hours as needed for mild pain or fever., Disp: , Rfl:  .  amLODipine (NORVASC) 10 MG tablet, Take 1 tablet (10 mg total) by mouth daily., Disp: 30 tablet, Rfl: 0 .  Bromfenac Sodium (PROLENSA) 0.07 % SOLN, Place 1 drop into both eyes 4 (four) times daily., Disp: 6 mL, Rfl: 3 .  carvedilol (COREG) 12.5 MG tablet, Take 1 tablet (12.5 mg total) by mouth 2 (two) times daily with a meal., Disp: 60 tablet, Rfl: 2 .  Cholecalciferol (VITAMIN D3) 25 MCG (1000 UT) CAPS, Take 1,000 Units by mouth daily. , Disp: , Rfl:  .  doxazosin (CARDURA) 4 MG tablet, Take 0.5 tablets (2 mg total) by mouth daily with supper., Disp: , Rfl:  .  fexofenadine (ALLEGRA) 180 MG tablet, Take 180 mg by mouth daily as needed (allergies.). , Disp: , Rfl:  .  insulin lispro protamine-lispro (HUMALOG 50/50 MIX) (50-50) 100 UNIT/ML SUSP injection, Inject 22-25 Units into the skin See admin instructions. Inject 25 units subcutaneously with breakfast and 22 units with supper, Disp: , Rfl:  .  Insulin Syringe-Needle U-100 (EASY TOUCH INSULIN SYRINGE) 30G X 5/16" 0.5 ML MISC, USE 3 TIMES DAILY, Disp: 270 each, Rfl: 3 .  losartan-hydrochlorothiazide (HYZAAR) 100-25 MG tablet, TAKE 1 TABLET BY MOUTH  DAILY (Patient  taking differently: Take 1 tablet by mouth daily with breakfast. ), Disp: 90 tablet, Rfl: 3 .  metFORMIN (GLUCOPHAGE-XR) 500 MG 24 hr tablet, Take 1,000 mg by mouth 2 (two) times daily with a meal. , Disp: , Rfl:  .  prednisoLONE acetate (PRED FORTE) 1 % ophthalmic suspension, Place 1 drop into both eyes 4 (four) times daily., Disp: 15 mL, Rfl: 0   Allergies  Allergen Reactions  . Bidil [Isosorb Dinitrate-Hydralazine] Other (See Comments)    Severe hypotension episode after administering one dose of Bidil 20-37.5 mg on 09/25/2019.   . Statins Other (See  Comments)    Leg pain     Review of Systems  Constitutional: Negative.   Eyes: Negative for blurred vision.  Respiratory: Negative.  Negative for shortness of breath.   Cardiovascular: Negative.  Negative for chest pain and palpitations.  Gastrointestinal: Negative.   Neurological: Negative.   Psychiatric/Behavioral: Negative.      Today's Vitals   12/26/19 1512  BP: (!) 146/78  Pulse: 74  Temp: (!) 97.4 F (36.3 C)  TempSrc: Oral  Weight: 232 lb 9.6 oz (105.5 kg)  Height: 6\' 3"  (1.905 m)  PainSc: 0-No pain   Body mass index is 29.07 kg/m.   Objective:  Physical Exam Vitals and nursing note reviewed.  Constitutional:      Appearance: Normal appearance.  Cardiovascular:     Rate and Rhythm: Normal rate and regular rhythm.     Heart sounds: Normal heart sounds.  Pulmonary:     Effort: Pulmonary effort is normal.     Breath sounds: Normal breath sounds.  Skin:    General: Skin is warm.  Neurological:     General: No focal deficit present.     Mental Status: He is alert.  Psychiatric:        Mood and Affect: Mood normal.         Assessment And Plan:     1. Malignant HTN with heart disease, w/o CHF, with chronic kidney disease  TCM PERFORMED. A MEMBER OF THE CLINICAL TEAM SPOKE WITH THE PATIENT UPON DISCHARGE. DISCHARGE SUMMARY WAS REVIEWED IN FULL DETAIL DURING THE VISIT. MEDS RECONCILED AND COMPARED TO DISCHARGE MEDS. MEDICATION LIST WAS UPDATED AND REVIEWED WITH THE PATIENT. GREATER THAN 50% FACE TO FACE TIME WAS SPENT IN COUNSELING AND COORDINATION OF CARE. ALL QUESTIONS WERE ANSWERED TO THE SATISFACTION OF THE PATIENT. Importance of medication and dietary compliance was discussed with the patient in full detail. He is encouraged to avoid adding salt to his foods. He is encouraged to come back to office in 1-2 weeks for nurse visit, and to bring his home meter to ensure that it is accurate.   2. Type 2 diabetes mellitus with other circulatory complications  (HCC)  Chronic, he plans to return to endocrinology for further evaluation. He is advised to gradually increase his daily activity as well.  He plans to see Dr. Buddy Duty in the near future.   3. Pure hypercholesterolemia  Chronic. He has been intolerant of statins in the past. I will consider adding Nexlitol to his regimen in the near future. He is encouraged to avoid fried foods.   4. Drug therapy    [name], MD    THE PATIENT IS ENCOURAGED TO PRACTICE SOCIAL DISTANCING DUE TO THE COVID-19 PANDEMIC.

## 2019-12-28 ENCOUNTER — Ambulatory Visit (INDEPENDENT_AMBULATORY_CARE_PROVIDER_SITE_OTHER): Payer: Medicare Other | Admitting: Ophthalmology

## 2019-12-28 ENCOUNTER — Encounter (INDEPENDENT_AMBULATORY_CARE_PROVIDER_SITE_OTHER): Payer: Self-pay | Admitting: Ophthalmology

## 2019-12-28 ENCOUNTER — Other Ambulatory Visit: Payer: Self-pay

## 2019-12-28 ENCOUNTER — Ambulatory Visit: Payer: Self-pay

## 2019-12-28 DIAGNOSIS — H35033 Hypertensive retinopathy, bilateral: Secondary | ICD-10-CM | POA: Diagnosis not present

## 2019-12-28 DIAGNOSIS — H3581 Retinal edema: Secondary | ICD-10-CM | POA: Diagnosis not present

## 2019-12-28 DIAGNOSIS — Z961 Presence of intraocular lens: Secondary | ICD-10-CM

## 2019-12-28 DIAGNOSIS — I10 Essential (primary) hypertension: Secondary | ICD-10-CM

## 2019-12-28 DIAGNOSIS — E113313 Type 2 diabetes mellitus with moderate nonproliferative diabetic retinopathy with macular edema, bilateral: Secondary | ICD-10-CM | POA: Diagnosis not present

## 2019-12-28 DIAGNOSIS — Z8669 Personal history of other diseases of the nervous system and sense organs: Secondary | ICD-10-CM

## 2019-12-28 DIAGNOSIS — E1159 Type 2 diabetes mellitus with other circulatory complications: Secondary | ICD-10-CM

## 2019-12-28 MED ORDER — TRIAMCINOLONE ACETONIDE 40 MG/ML IO SUSP
4.0000 mg | INTRAOCULAR | Status: AC | PRN
  Administered 2019-12-28: 4 mg via INTRAVITREAL

## 2019-12-28 MED ORDER — AFLIBERCEPT 2MG/0.05ML IZ SOLN FOR KALEIDOSCOPE
2.0000 mg | INTRAVITREAL | Status: AC | PRN
  Administered 2019-12-28: 2 mg via INTRAVITREAL

## 2019-12-28 NOTE — Chronic Care Management (AMB) (Signed)
  Chronic Care Management    Social Work General Note  12/28/2019 Name: Marcus Johnston MRN: [id number] DOB: [birth year]  Marcus Johnston is a 86 y.o. year old male who is a primary care patient of Glendale Chard, MD. The CCM was consulted to assist the patient with care coordination.   Review of patient status, including review of consultants reports, relevant laboratory and other test results, and collaboration with appropriate care team members and the patient's provider was performed as part of comprehensive patient evaluation and provision of chronic care management services.    SDOH (Social Determinants of Health) assessments and interventions performed:  Yes, no acute needs present at this time.    Outpatient Encounter Medications as of 12/28/2019  Medication Sig  . acetaminophen (TYLENOL) 325 MG tablet Take 650 mg by mouth every 6 (six) hours as needed for mild pain or fever.  Marland Kitchen amLODipine (NORVASC) 10 MG tablet Take 1 tablet (10 mg total) by mouth daily.  . Bromfenac Sodium (PROLENSA) 0.07 % SOLN Place 1 drop into both eyes 4 (four) times daily.  . carvedilol (COREG) 12.5 MG tablet Take 1 tablet (12.5 mg total) by mouth 2 (two) times daily with a meal.  . Cholecalciferol (VITAMIN D3) 25 MCG (1000 UT) CAPS Take 1,000 Units by mouth daily.   Marland Kitchen doxazosin (CARDURA) 4 MG tablet Take 0.5 tablets (2 mg total) by mouth daily with supper.  . fexofenadine (ALLEGRA) 180 MG tablet Take 180 mg by mouth daily as needed (allergies.).   Marland Kitchen insulin lispro protamine-lispro (HUMALOG 50/50 MIX) (50-50) 100 UNIT/ML SUSP injection Inject 22-25 Units into the skin See admin instructions. Inject 25 units subcutaneously with breakfast and 22 units with supper  . Insulin Syringe-Needle U-100 (EASY TOUCH INSULIN SYRINGE) 30G X 5/16" 0.5 ML MISC USE 3 TIMES DAILY  . losartan-hydrochlorothiazide (HYZAAR) 100-25 MG tablet TAKE 1 TABLET BY MOUTH  DAILY (Patient taking differently: Take 1 tablet by mouth daily with  breakfast. )  . metFORMIN (GLUCOPHAGE-XR) 500 MG 24 hr tablet Take 1,000 mg by mouth 2 (two) times daily with a meal.   . prednisoLONE acetate (PRED FORTE) 1 % ophthalmic suspension Place 1 drop into both eyes 4 (four) times daily.   No facility-administered encounter medications on file as of 12/28/2019.    Follow Up Plan: No SW follow up needed at this time. Next PharmD appointment planned for 01/26/20. SW collaborated with PharmD, Jannette Fogo to inform of recent inpatient stay.       Daneen Schick, BSW, CDP Social Worker, Certified Dementia Practitioner [facility] [phone]

## 2020-01-03 ENCOUNTER — Other Ambulatory Visit: Payer: Self-pay

## 2020-01-03 ENCOUNTER — Encounter: Payer: Self-pay | Admitting: Internal Medicine

## 2020-01-03 DIAGNOSIS — I10 Essential (primary) hypertension: Secondary | ICD-10-CM

## 2020-01-03 MED ORDER — CARVEDILOL 12.5 MG PO TABS: 12.5000 mg | ORAL_TABLET | Freq: Two times a day (BID) | ORAL | 2 refills | Status: DC

## 2020-01-11 DIAGNOSIS — E1165 Type 2 diabetes mellitus with hyperglycemia: Secondary | ICD-10-CM | POA: Diagnosis not present

## 2020-01-24 NOTE — Chronic Care Management (AMB) (Signed)
Chronic Care Management Pharmacy  Name: Marcus Johnston  MRN: [id number] DOB: 1930/10/25  Chief Complaint/ HPI  Marcus Johnston,  86 y.o. , male presents for his Follow-Up CCM visit with the clinical pharmacist via telephone due to COVID-19 Pandemic.  PCP : Glendale Chard, MD  His chronic conditions include: Diabetes, hypertension, pure hypercholesterolemia  Office Visits: 12/26/19 OV: Presented for hospital follow up after hypertensive emergency. Pt reported compliance with medications since hospital discharge. Encouraged to return in 1-2 weeks for nurse visit and to bring in home BP meter to ensure accuracy. Will consider adding Nexletol for hypercholesterolemia in the near future (pt intolerant of statins). Reduced amlodipine to 10mg  1/2 tablet daily.  2/24-3/2: Multiple telephone calls regarding doxazosin- Patient did not remember who prescribed this medication initially, but that it was a prostate specialist. Dr. Baird Cancer recommended patient cut dose in half (to 2 mg daily) because it might be contributing to dizziness.   07/06/19 OV: HTN evaluation, encourage medication compliance, carvedilol recently increased by cardiologist, BP 162/7mmHg in office.  06/22/19 OV: BP 146/42mmHg, follow up after cardiology visit, encouraged to take new carvedilol dose that cardio prescribed.  Consult Visit: 01/15/20 Ophthalmology OV w/ Dr. Coralyn Pear: Prolensa samples given in office. Eye injections administered. Follow up in 4 weeks.   5/2-12/20/19 ED admission: Presented with transient aphasia (resolved prior to arriving at ED) and markedly elevated BP concerning for hypertensive crisis. MRI brain negative for stroke. BP control improved after resuming home meds. Start amlodipine 10mg  daily.   12/04/19 Podiatry OV w/ Dr. Elisha Ponder: Presented for toenail debridement (pain due to onychomycosis of toenail), patient education on diabetic foot care given, follow up in 3 months for  debridement.  11/30/19-Ophthalmology OV w/ Dr. Coralyn Pear- Presented for non-proliferative diabetic retinopathy w/ macular edema. Patient stopped prednisolone and ketorolac eye drops. Restart prednisolone eye drops and add Prolensa eye drops. Samples given in office. Eylea intravitreal injections administered. Follow up in 4 weeks.  11/01/19 Ophthalmology OV w/ Dr. Coralyn Pear: Presented for non-proliferative diabetic retinopathy w/ macular edema.Using ketorolac and prednisolone eye drops. Eylea intravitreal injections administered. Follow up in 4 weeks.  10/04/19- Ophthalmology OV w/ Dr. Coralyn Pear- Presented for non-proliferative diabetic retinopathy w/ macular edema.Using ketorolac and prednisolone eye drops. Eylea intravitreal injections administered. Follow up in 4 weeks.  09/25/19 ED visit: Syncope and collapse; no acute abnormalities on MRI. Was given dose of Bidil in cardiologist office and 15 minutes later had syncopal event.   09/25/19 Cardiology OV w/ Dr. Virgina Jock: Patient's blood pressure was 206/161mmHg in office so patient was given 1 dose of Bidil 20-37.5mg . 15 minutes later patient was confused, pulse became thready and BP dropped to 68/71mmHg. Patient stopped breathing for a few seconds but then regained consciousness. BP increased to 200/100 again and then decreased to 140/100 5 minutes later. Sent to ED for further evaluation  08/28/19 Cardiology OV: BP 189/97 in office; patient instructed to check BP at home and bring log to next visit in 4 weeks  08/14/19 Cardiology OV: BP 204/98 and 184/81, restarted carvedilol (stopped previously because of dizziness)  08/09/19- Shingles ED visit  07/31/19- Endocrinology OV w/ Dr. Buddy Duty  07/28/19 Ophthalmology OV w/ Dr. Coralyn Pear Presented for non-proliferative diabetic retinopathy w/ macular edema.Using ketorolac and prednisolone eye drops. Eylea intravitreal injections administered. Patient education on chronic nature of condition and need for close monitoring  of blood pressure, blood sugar, and lipids. Follow up in 4 weeks.  06/19/19 Cardiology OV: Carvedilol increased to 12.5mg  twice daily due  to uncontrolled blood pressure. Recommended close follow up with PCP.   Medications: Outpatient Encounter Medications as of 01/25/2020  Medication Sig  . acetaminophen (TYLENOL) 325 MG tablet Take 650 mg by mouth every 6 (six) hours as needed for mild pain or fever.  Marland Kitchen amLODipine (NORVASC) 10 MG tablet Take 1 tablet (10 mg total) by mouth daily. (Patient taking differently: Take 5 mg by mouth in the morning and at bedtime. 1/2 tablet twice daily)  . Bromfenac Sodium (PROLENSA) 0.07 % SOLN Place 1 drop into both eyes 4 (four) times daily.  . carvedilol (COREG) 12.5 MG tablet Take 1 tablet (12.5 mg total) by mouth 2 (two) times daily with a meal.  . Cholecalciferol (VITAMIN D3) 25 MCG (1000 UT) CAPS Take 1,000 Units by mouth daily.   Marland Kitchen doxazosin (CARDURA) 4 MG tablet Take 0.5 tablets (2 mg total) by mouth daily with supper.  . fexofenadine (ALLEGRA) 180 MG tablet Take 180 mg by mouth daily as needed (allergies.).   Marland Kitchen insulin lispro protamine-lispro (HUMALOG 50/50 MIX) (50-50) 100 UNIT/ML SUSP injection Inject 22-25 Units into the skin See admin instructions. Inject 25 units subcutaneously with breakfast and 22 units with supper  . Insulin Syringe-Needle U-100 (EASY TOUCH INSULIN SYRINGE) 30G X 5/16" 0.5 ML MISC USE 3 TIMES DAILY  . losartan-hydrochlorothiazide (HYZAAR) 100-25 MG tablet TAKE 1 TABLET BY MOUTH  DAILY (Patient taking differently: Take 1 tablet by mouth daily with breakfast. )  . metFORMIN (GLUCOPHAGE-XR) 500 MG 24 hr tablet Take 1,000 mg by mouth 2 (two) times daily with a meal.   . prednisoLONE acetate (PRED FORTE) 1 % ophthalmic suspension Place 1 drop into both eyes 4 (four) times daily.   No facility-administered encounter medications on file as of 01/25/2020.   Current Diagnosis/Assessment:  SDOH Interventions     Most Recent Value  SDOH  Interventions  Financial Strain Interventions Other (Comment)  [Offered patient assistance for Prolensa, but patient is already receiving through ophthalmologist]      Goals Addressed            This Visit's Progress   . Diabetes: Goal A1c <7%   On track    CARE PLAN ENTRY  Current Barriers:  . Diabetes: Uncontrolled type 2; complicated by chronic medical conditions including kidney disease and hypertension Lab Results  Component Value Date   HGBA1C 7.9 (H) 04/30/2019 .   Lab Results  Component Value Date   CREATININE 1.24 09/25/2019   CREATININE 1.21 05/02/2019   CREATININE 1.43 (H) 04/30/2019   . Current antihyperglycemic regimen:  o Metformin XR 500mg  2 tablets twice daily o Humalog 50/50 Mix 25 units with breakfast, 22 units with supper . Denies hypoglycemic symptoms, including dizziness, lightheadedness, shaking, sweating . Denies hyperglycemic symptoms, including polyuria, polydipsia, polyphagia, nocturia, blurred vision, neuropathy . Current exercise: Not much . Current blood glucose readings: 119,115,129,167,162,129,157,123,109,118, [phone] . Cardiovascular risk reduction: o Current hypertensive regimen:  - Carvedilol 12.5mg  twice daily with a meal - Doxazosin 4mg  1/2 tablet daily (prostate) - Losartan/HCTZ 100-25mg  daily - Amlodipine 10mg  1/2 tablet twice daily o Current hyperlipidemia regimen: N/A o Current antiplatelet regimen: N/A  Pharmacist Clinical Goal(s):  Marland Kitchen Over the next 6 weeks, patient will work with PharmD, endocrinologist, and primary care provider to address elevated blood sugars and A1c above goal.  Interventions: . Comprehensive medication review performed, medication list updated in electronic medical record . Patient's blood sugars have improved since our last appointment . Encouraged healthy diet and 150 minutes of  exercise per week  Patient Self Care Activities:  . Patient will check blood glucose twice  daily over the next 6 weeks, document, and provide at future appointments . Patient will focus on medication adherence by continued use of weekly pill box over the next 6 weeks . Patient will increase exercise to 30 minutes 5 times weekly over the next 6 weeks . Patient will limit soda intake over the next 6 weeks . Patient will take medications as prescribed . Patient will contact provider with any episodes of hypoglycemia . Patient will report any questions or concerns to provider   Please see past updates related to this goal by clicking on the "Past Updates" button in the selected goal      . High Cholesterol Management   Not on track    CARE PLAN ENTRY  Current Barriers:  . Controlled hyperlipidemia, complicated by diabetes . Current antihyperlipidemic regimen: N/A . Previous antihyperlipidemic medications: Patient has tried statins in the past . Most recent lipid panel:  Lipid Panel     Component Value Date/Time   CHOL 170 12/19/2019 0135   CHOL 158 03/23/2019 1339   TRIG 63 12/19/2019 0135   HDL 62 12/19/2019 0135   HDL 76 03/23/2019 1339   CHOLHDL 2.7 12/19/2019 0135   VLDL 13 12/19/2019 0135   LDLCALC 95 12/19/2019 0135   LDLCALC 68 03/23/2019 1339   LABVLDL 14 03/23/2019 1339    . ASCVD risk enhancing conditions: age >25, DM, HTN, CKD  Pharmacist Clinical Goal(s):  Marland Kitchen Over the next 6 weeks patient will work with PharmD and providers towards optimized antihyperlipidemic therapy  Interventions: . Comprehensive medication review performed; medication list updated in electronic medical record.  . Discussed retrial of statin versus alternative therapy with PCP. Per PCP, will consider Nexletol at future appointment  Patient Self Care Activities:  . Patient will focus on heart healthy diet and 150 minutes of exercise per week over the next 6 weeks  Please see past updates related to this goal by clicking on the "Past Updates" button in the selected goal      .  Hypertension: Goal BP <140/90   Not on track    CARE PLAN ENTRY  Current Barriers:  . Uncontrolled hypertension, complicated by diabetes and chronic kidney disease . Current antihypertensive regimen:  o Carvedilol 12.5mg  twice daily with a meal o Doxazosin 4mg  1/2 tablet daily (prostate) o Losartan/HCTZ 100-25mg  daily o Amlodipine 10mg  1/2 tablet twice daily . Previous antihypertensives tried: Diltiazem, hydralazine, metoprolol . Last practice recorded BP readings:  BP Readings from Last 3 Encounters:  12/26/19 (!) 146/78  12/20/19 138/88  11/01/19 (!) 204/91   . Current home BP readings:  153/89, 159/93, 169/91, 138/70, 162/100, 156/82, 178/97, 147/81, 153/85, 144/75, 165/88, 132/71, 159/89, 138/79  Pharmacist Clinical Goal(s):  Marland Kitchen Over the next 6 weeks, patient will work with PharmD and providers to optimize antihypertensive regimen  Interventions: . Comprehensive medication review performed; medication list updated in the electronic medical record.  . Counseled patient on importance of diet and exercise modifications  Patient Self Care Activities:  . Patient will continue to check BP daily and if symptomatic over the next 6 weeks, document, and provide at future appointments . Patient will focus on medication adherence by continued use of pillbox over the next 6 weeks  Please see past updates related to this goal by clicking on the "Past Updates" button in the selected goal         Diabetes  Recent Relevant Labs: Lab Results  Component Value Date/Time   HGBA1C 8.0 (H) 12/18/2019 10:34 AM   HGBA1C 7.9 (H) 04/30/2019 02:53 AM   MICROALBUR 150 03/23/2019 04:38 PM     Kidney Function Lab Results  Component Value Date/Time   CREATININE 1.08 12/20/2019 07:50 AM   CREATININE 1.17 12/19/2019 08:45 AM   GFRNONAA >60 12/20/2019 07:50 AM   GFRAA >60 12/20/2019 07:50 AM  Stage 2 CKD  Checking BG: 2x per Day  Recent FBG Readings:  119,115,129,167,162,129,157,123,109,118 Recent evening pre-meal BG readings: 168,146,93,135,149,99,143,109,108,149  Patient has failed these meds in past: [place] Patient is currently uncontrolled on the following medications:  -Metformin XR 500mg  2 tablets twice daily -Humalog 50/50 Mix 25 units with breakfast, 22 units with supper  Last diabetic Foot exam: 08/28/19 Last diabetic Eye exam: 11/30/19  We discussed:  -Pt reported that when BG was 93, he only took 15 units of Humalog 50/50. The following morning BG was 167 -Instructed pt to take the full dose of insulin prescribed (as long as he is planning on eating as usual) unless BG is low (ie.70) and then a lower dose may be warranted -Addressed appropriate fasting and peak post prandial BG goals 80-130 and <180, respectively. Also explained that pre-meal BG may be higher if patient ate previous meal within a few hours.  -Diet and exercise extensively  Limit snacks and reduce soda intake  Pt states that he is not currently during much exercising  Recommended he increase exercise to 30 minutes 5 days per  week   Plan -Continue current medications   Diabetic Retinopathy   Patient has failed these meds in past: Ketorolac Patient is currently controlled on the following medications:  -Prednisolone 1% eye drops 4 times daily -Prolensa eye drops 4 times daily -Eylea intravitreal injections   We discussed:   -Pt mentioned the high cost of Prolensa and difficulty using coupon provided by ophthalmologist at pharmacy during our last appointment -Reviewed patient assistance options for Prolensa prior to today's appointment -Upon discussing with pt today, he is already receiving assistance through his ophthalmologist and the cost of Prolensa is no longer an issue.  Plan -Continue current medications  Hypertension   Office blood pressures are  BP Readings from Last 3 Encounters:  12/26/19 (!) 146/78  12/20/19 138/88  11/01/19 (!)  204/91   Prior diagnosis of paroxysmal atrial tachycardia, s/p ablation 05/08/19  Patient has failed these meds in the past: Diltiazem, hydralazine, metoprolol tartrate  Patient checks BP at home twice daily  Patient home BP readings are ranging: 153/89, 159/93, 169/91, 138/70, 162/100, 156/82, 178/97, 147/81, 153/85, 144/75, 165/88, 132/71, 159/89, 138/79  Patient reports no current dizziness  Patient states he does not feel that he has white coat syndrome   Patient is currently uncontrolled on the following medications:  -Carvedilol 12.5mg  twice daily with a meal -Doxazosin 4mg  1/2 tablet daily (prostate) -Losartan/HCTZ 100-25mg  daily -Amlodipine 10mg  1/2 tablet twice daily  We discussed: -Diet and exercise extensively  Breakfast: 1/2 bagel, sausage patty, yogurt, water  Lunch: "Light", hot dog, hamburger, peanut butter crackers and milk  Dinner: Meatloaf, pork chop, chicken, vegetable (broccoli, etc)  Drinks: Primarily water (3- 16oz bottles), lemonade, and tea  Not much snacking  Recommend limit red meat, eat more lean meats like chicken and  fish  Recommend increase to 4 bottles of water per day   Recommend salads for lunch  -Recommend pt check BP daily -Pt states he is going to be referred to  Cardiology by PCP  Plan -Continue current medications  -Heart healthy diet  Hyperlipidemia   Lipid Panel     Component Value Date/Time   CHOL 170 12/19/2019 0135   CHOL 158 03/23/2019 1339   TRIG 63 12/19/2019 0135   HDL 62 12/19/2019 0135   HDL 76 03/23/2019 1339   CHOLHDL 2.7 12/19/2019 0135   VLDL 13 12/19/2019 0135   LDLCALC 95 12/19/2019 0135   LDLCALC 68 03/23/2019 1339   LABVLDL 14 03/23/2019 1339     The ASCVD Risk score (Goff DC Jr., et al., 2013) failed to calculate for the following reasons:   The 2013 ASCVD risk score is only valid for ages 57 to 3   Patient has failed these meds in past: Statin allergy listed in chart  Patient is currently controlled  on the following medications: N/A  We discussed:   -Diet and exercise extensively  Plan -Continue control with diet and exercise -Provider will consider Nexletol in the future for LDL reduction  Medication Management   Pt uses [facility] order pharmacy for all medications Uses pill box? Yes Pt endorses 100% compliance  We discussed:  -The importance of medication compliance  Plan -Continue current medication management strategy  Follow up: 6 weeks phone visit  Jannette Fogo, PharmD  Clinical Pharmacist [facility] [phone]

## 2020-01-24 NOTE — Progress Notes (Signed)
[facility] Note  01/26/2020     CHIEF COMPLAINT Patient presents for Retina Follow Up   HISTORY OF PRESENT ILLNESS: Marcus Johnston is a 86 y.o. male who presents to the clinic today for:   HPI    Retina Follow Up    Patient presents with  Diabetic Retinopathy.  In both eyes.  This started 4 weeks ago.  Severity is moderate.  I, the attending physician,  performed the HPI with the patient and updated documentation appropriately.          Comments    Patient here for 4 weeks retina follow up for NPDR OU. Patient states vision about the same. No eye pain.        Last edited by [name], MD on 01/26/2020 11:53 AM. (History)    Patient    Referring physician: Glendale Chard, MD [address] [place],  [place] [address]  HISTORICAL INFORMATION:   Selected notes from the MEDICAL RECORD NUMBER Referred by Dr. [name] for CME OU   CURRENT MEDICATIONS: Current Outpatient Medications (Ophthalmic Drugs)  Medication Sig  . Bromfenac Sodium (PROLENSA) 0.07 % SOLN Place 1 drop into both eyes 4 (four) times daily.  . prednisoLONE acetate (PRED FORTE) 1 % ophthalmic suspension Place 1 drop into both eyes 4 (four) times daily.   No current facility-administered medications for this visit. (Ophthalmic Drugs)   Current Outpatient Medications (Other)  Medication Sig  . acetaminophen (TYLENOL) 325 MG tablet Take 650 mg by mouth every 6 (six) hours as needed for mild pain or fever.  Marland Kitchen amLODipine (NORVASC) 10 MG tablet Take 1 tablet (10 mg total) by mouth daily. (Patient taking differently: Take 5 mg by mouth in the morning and at bedtime. 1/2 tablet twice daily)  . carvedilol (COREG) 12.5 MG tablet Take 1 tablet (12.5 mg total) by mouth 2 (two) times daily with a meal.  . Cholecalciferol (VITAMIN D3) 25 MCG (1000 UT) CAPS Take 1,000 Units by mouth daily.   Marland Kitchen doxazosin (CARDURA) 4 MG tablet Take 0.5 tablets (2 mg total) by mouth daily with  supper.  . fexofenadine (ALLEGRA) 180 MG tablet Take 180 mg by mouth daily as needed (allergies.).   Marland Kitchen insulin lispro protamine-lispro (HUMALOG 50/50 MIX) (50-50) 100 UNIT/ML SUSP injection Inject 22-25 Units into the skin See admin instructions. Inject 25 units subcutaneously with breakfast and 22 units with supper  . Insulin Syringe-Needle U-100 (EASY TOUCH INSULIN SYRINGE) 30G X 5/16" 0.5 ML MISC USE 3 TIMES DAILY  . losartan-hydrochlorothiazide (HYZAAR) 100-25 MG tablet TAKE 1 TABLET BY MOUTH  DAILY (Patient taking differently: Take 1 tablet by mouth daily with breakfast. )  . metFORMIN (GLUCOPHAGE-XR) 500 MG 24 hr tablet Take 1,000 mg by mouth 2 (two) times daily with a meal.    No current facility-administered medications for this visit. (Other)      REVIEW OF SYSTEMS: ROS    Positive for: Neurological, Genitourinary, Endocrine, Cardiovascular, Eyes, Respiratory   Negative for: Constitutional, Gastrointestinal, Skin, Musculoskeletal, HENT, Psychiatric, Allergic/Imm, Heme/Lymph   Last edited by Theodore Demark, COA on 01/26/2020  9:38 AM. (History)       ALLERGIES Allergies  Allergen Reactions  . Bidil [Isosorb Dinitrate-Hydralazine] Other (See Comments)    Severe hypotension episode after administering one dose of Bidil 20-37.5 mg on 09/25/2019.   . Statins Other (See Comments)    Leg pain    PAST MEDICAL HISTORY Past Medical History:  Diagnosis Date  . Atrial fibrillation ([place])   . Atypical atrial flutter ([place]) 10/17/2018  . Diabetes mellitus without complication ([place])   . Diabetic retinopathy ([place])    NPDR OU  . High cholesterol   . History of prostate cancer   . HTN (hypertension)   . Hypertensive retinopathy    OU  . Renal insufficiency   . Shingles    Past Surgical History:  Procedure Laterality Date  . ATRIAL TACH ABLATION N/A 05/08/2019   Procedure: ATRIAL TACH ABLATION;  Surgeon: [name], MD;  Location: [facility];  Service: Cardiovascular;   Laterality: N/A;  . CATARACT EXTRACTION Bilateral   . EYE SURGERY Bilateral    Cat Sx  . prostate implant      FAMILY HISTORY Family History  Problem Relation Age of Onset  . Cancer Mother   . Cancer Father     SOCIAL HISTORY Social History   Tobacco Use  . Smoking status: Never Smoker  . Smokeless tobacco: Never Used  Vaping Use  . Vaping Use: Never used  Substance Use Topics  . Alcohol use: No  . Drug use: No         OPHTHALMIC EXAM:  Base Eye Exam    Visual Acuity (Snellen - Linear)      Right Left   Dist cc 20/40 -2 20/100 -2   Dist ph cc 20/40 +2 20/80 -2   Correction: Glasses       Tonometry (Tonopen, 9:35 AM)      Right Left   Pressure 15 17       Pupils      Dark Light Shape React APD   Right 3 2 Round Brisk None   Left 3 2 Round Brisk None       Visual Fields (Counting fingers)      Left Right    Full        Extraocular Movement      Right Left    Full, Ortho Full, Ortho       Neuro/Psych    Oriented x3: Yes   Mood/Affect: Normal       Dilation    Both eyes: 1.0% Mydriacyl, 2.5% Phenylephrine @ 9:35 AM        Slit Lamp and Fundus Exam    Slit Lamp Exam      Right Left   Lids/Lashes Dermatochalasis - upper lid, mild Meibomian gland dysfunction Dermatochalasis - upper lid, mild Meibomian gland dysfunction   Conjunctiva/Sclera Mild Melanosis Mild Melanosis   Cornea Trace Punctate epithelial erosions 1+ Punctate epithelial erosions. endopigment   Anterior Chamber Deep and quiet Deep and quiet   Iris Round and dilated, No NVI Round and dilated, No NVI   Lens Toric PC IOL with marks at 1200 and 0600 PC IOL in good position with PC folds   Vitreous Vitreous syneresis, PVD and vitreous condensations, no residual IVTA Vitreous syneresis, no residual IVTA       Fundus Exam      Right Left   Disc Mild Pallor, mild, temporal Peripapillary atrophy, Sharp rim temporal Peripapillary atrophy, +cupping, mild Pallor, Sharp rim   C/D Ratio  0.6 0.65   Macula Blunted foveal reflex, central IRH -- persistent, pigment clumping, Microaneurysms, +central cystic changes--improved Blunted foveal reflex, Retinal pigment epithelial mottling and clumping, focal Exudates, +IRH, +central Cystic changes -- slightly improved, scattered Microaneurysms   Vessels Vascular attenuation, Tortuous Vascular attenuation, mild Copper wiring, AV crossing changes  Periphery Attached, large HST at 0500 with surrounding SRF, +pigmented barrier laser scar from 0430-0600    Attached, small pigmented operculum at 0730 midzone--stable, no SRF             IMAGING AND PROCEDURES  Imaging and Procedures for @TODAY @  OCT, Retina - OU - Both Eyes       Right Eye Quality was good. Central Foveal Thickness: 314. Progression has improved. Findings include abnormal foveal contour, intraretinal fluid, no SRF, intraretinal hyper-reflective material (Interval decrease in IRF; Trace ERM, vitreous opacities also improved).   Left Eye Quality was good. Central Foveal Thickness: 303. Progression has been stable. Findings include intraretinal fluid, abnormal foveal contour, intraretinal hyper-reflective material, no SRF, outer retinal atrophy (Mild ERM; persistent IRF/IRHM -- ?mild interval improvement).   Notes *Images captured and stored on drive  Diagnosis / Impression:  OD: Interval decrease in IRF; Trace ERM, vitreous opacities also improved OS: Mild ERM; persistent IRF/IRHM -- ?mild interval improvement  Clinical management:  See below  Abbreviations: NFP - Normal foveal profile. CME - cystoid macular edema. PED - pigment epithelial detachment. IRF - intraretinal fluid. SRF - subretinal fluid. EZ - ellipsoid zone. ERM - epiretinal membrane. ORA - outer retinal atrophy. ORT - outer retinal tubulation. SRHM - subretinal hyper-reflective material        Intravitreal Injection, Pharmacologic Agent - OD - Right Eye       Time Out 01/26/2020. 10:44 AM.  Confirmed correct patient, procedure, site, and patient consented.   Anesthesia Topical anesthesia was used. Anesthetic medications included Lidocaine 2%, Proparacaine 0.5%.   Procedure Preparation included 5% betadine to ocular surface, eyelid speculum. A (32g) needle was used.   Injection:  2 mg aflibercept Alfonse Flavors) SOLN   NDC: A3590391, Lot: [id number], Expiration date: 05/13/2020   Route: Intravitreal, Site: Right Eye, Waste: 0.05 mL  Post-op Post injection exam found visual acuity of at least counting fingers. The patient tolerated the procedure well. There were no complications. The patient received written and verbal post procedure care education.        Intravitreal Injection, Pharmacologic Agent - OS - Left Eye       Time Out 01/26/2020. 10:45 AM. Confirmed correct patient, procedure, site, and patient consented.   Anesthesia Topical anesthesia was used. Anesthetic medications included Lidocaine 2%.   Procedure Preparation included 5% betadine to ocular surface, eyelid speculum. A (32g) needle was used.   Injection:  2 mg aflibercept Alfonse Flavors) SOLN   NDC: A3590391, Lot: [id number], Expiration date: 05/13/2020   Route: Intravitreal, Site: Left Eye, Waste: 0.05 mL  Post-op Post injection exam found visual acuity of at least counting fingers. The patient tolerated the procedure well. There were no complications. The patient received written and verbal post procedure care education.                 ASSESSMENT/PLAN:    ICD-10-CM   1. Moderate nonproliferative diabetic retinopathy of both eyes with macular edema associated with type 2 diabetes mellitus (HCC)  W73.7106 Intravitreal Injection, Pharmacologic Agent - OD - Right Eye    Intravitreal Injection, Pharmacologic Agent - OS - Left Eye    aflibercept (EYLEA) SOLN 2 mg    aflibercept (EYLEA) SOLN 2 mg  2. Retinal edema  H35.81 OCT, Retina - OU - Both Eyes  3. History of retinal detachment  Z86.69   4.  Essential hypertension  I10   5. Hypertensive retinopathy of both eyes  H35.033   6. Pseudophakia of both  eyes  Z96.1     1,2.  Non-proliferative diabetic retinopathy w/ macular edema OU  - patient with long standing history of macular edema -- was seen by Dr. Zigmund Daniel back in 2013  - has also seen Dr. Baird Cancer, Dr. Posey Pronto, [place] [facility] at [facility]  - last retina f/u in 2019 with Dr. Posey Pronto and at [facility]  - pt has previously received intravitreal injections OU, but does not like them and felt like benefits from injections were minmal  - at initial visit, was using PF and ketorolac QID OU -- pt self d/c  - exam shows scattered/minimal MA but central edema OU  - FA (12.11.20) shows late leaking MA, OS shows capillary nonperfusion, OD with ?petaloid central hyperfluorescence -- ?CME component  - s/p IVA# 1 OD (12.11.20)  - s/p IVE #1 OD (01.20.21), #2 (02.17.21), #3 (03.17.21), #4 (05.13.21)  - s/p IVE# 1 OS (12.11.20), #2 (01.20.21), #3 (02.17.21), #4 (03.17.21), #5 (04.15.21), #6 (4.15.21)             - s/p IVTA OD #1 (04.15.21)  - s/p IVTA OS #1 (05.13.21)  - BCVA OD: 20/40; OS: 20/80  - OCT shows mild interval improvement in IRF OD (good response to IVTA); persistent IRF OS   - Eylea4U benefits investigation initiated 12.11.20 -- approved for 2021  - ?resistance to [place]  today, 06.11.21  - pt wishes to proceed  - RBA of procedure discussed, questions answered  - informed consent obtained  - see procedure notes  - Eylea informed consent form signed and scanned on 01.20.21  - IVTA informed consent form signed and scanned on 04.15.20  - cont PF QID OU and add Prolensa QID OU -- sample given in office  - f/u in 4 wks -- DFE/OCT/possible injection  3. History of inferior HST w/ focal RD OD s/p laser retinopexy  - HST at 0500 w/ surrounding SRF  - good barricade laser in place spanning 0430-0600  - stable  4,5. Hypertensive retinopathy OU  -  discussed importance of tight BP control  - monitor   6. Pseudophakia OU  - s/p CE/IOL OU  - beautiful surgeries, doing well  - monitor   Ophthalmic Meds Ordered this visit:  Meds ordered this encounter  Medications  . aflibercept (EYLEA) SOLN 2 mg  . aflibercept (EYLEA) SOLN 2 mg       Return in about 4 weeks (around 02/23/2020) for 4 wk f/u for mod NPDR OU w/DFE&OCT.  There are no Patient Instructions on file for this visit.   Explained the diagnoses, plan, and follow up with the patient and they expressed understanding.  Patient expressed understanding of the importance of proper follow up care.   This document serves as a record of services personally performed by Gardiner Sleeper, MD, PhD. It was created on their behalf by Ernest Mallick, OA, an ophthalmic assistant. The creation of this record is the provider's dictation and/or activities during the visit.    Electronically signed by: Ernest Mallick, OA 06.09.2021 12:19 AM   Gardiner Sleeper, M.D., Ph.D. Diseases & Surgery of the Retina and Vitreous [facility]  I have reviewed the above documentation for accuracy and completeness, and I agree with the above. Gardiner Sleeper, M.D., Ph.D. 01/31/20 12:19 AM    Abbreviations: M myopia (nearsighted); A astigmatism; H hyperopia (farsighted); P presbyopia; Mrx  spectacle prescription;  CTL contact lenses; OD right eye; OS left eye; OU both eyes  XT exotropia; ET esotropia; PEK punctate epithelial keratitis; PEE punctate epithelial erosions; DES dry eye syndrome; MGD meibomian gland dysfunction; ATs artificial tears; PFAT's preservative free artificial tears; [place] nuclear sclerotic cataract; PSC posterior subcapsular cataract; ERM epi-retinal membrane; PVD posterior vitreous detachment; RD retinal detachment; DM diabetes mellitus; DR diabetic retinopathy; NPDR non-proliferative diabetic retinopathy; PDR proliferative diabetic retinopathy; CSME clinically significant  macular edema; DME diabetic macular edema; dbh dot blot hemorrhages; CWS cotton wool spot; POAG primary open angle glaucoma; C/D cup-to-disc ratio; HVF humphrey visual field; GVF goldmann visual field; OCT optical coherence tomography; IOP intraocular pressure; BRVO Branch retinal vein occlusion; CRVO central retinal vein occlusion; CRAO central retinal artery occlusion; BRAO branch retinal artery occlusion; RT retinal tear; SB scleral buckle; PPV pars plana vitrectomy; VH Vitreous hemorrhage; PRP panretinal laser photocoagulation; IVK intravitreal kenalog; VMT vitreomacular traction; MH Macular hole;  NVD neovascularization of the disc; NVE neovascularization elsewhere; AREDS age related eye disease study; ARMD age related macular degeneration; POAG primary open angle glaucoma; EBMD epithelial/anterior basement membrane dystrophy; ACIOL anterior chamber intraocular lens; IOL intraocular lens; PCIOL posterior chamber intraocular lens; Phaco/IOL phacoemulsification with intraocular lens placement; [place] photorefractive keratectomy; LASIK laser assisted in situ keratomileusis; HTN hypertension; DM diabetes mellitus; COPD chronic obstructive pulmonary disease

## 2020-01-25 ENCOUNTER — Ambulatory Visit: Payer: Self-pay

## 2020-01-25 ENCOUNTER — Other Ambulatory Visit: Payer: Self-pay

## 2020-01-25 DIAGNOSIS — I10 Essential (primary) hypertension: Secondary | ICD-10-CM

## 2020-01-25 DIAGNOSIS — E7800 Pure hypercholesterolemia, unspecified: Secondary | ICD-10-CM

## 2020-01-25 DIAGNOSIS — E1159 Type 2 diabetes mellitus with other circulatory complications: Secondary | ICD-10-CM

## 2020-01-25 NOTE — Patient Instructions (Signed)
Visit Information  Goals Addressed            This Visit's Progress   . Diabetes: Goal A1c <7%   On track    CARE PLAN ENTRY  Current Barriers:  . Diabetes: Uncontrolled type 2; complicated by chronic medical conditions including kidney disease and hypertension Lab Results  Component Value Date   HGBA1C 7.9 (H) 04/30/2019 .   Lab Results  Component Value Date   CREATININE 1.24 09/25/2019   CREATININE 1.21 05/02/2019   CREATININE 1.43 (H) 04/30/2019   . Current antihyperglycemic regimen:  o Metformin XR 500mg  2 tablets twice daily o Humalog 50/50 Mix 25 units with breakfast, 22 units with supper . Denies hypoglycemic symptoms, including dizziness, lightheadedness, shaking, sweating . Denies hyperglycemic symptoms, including polyuria, polydipsia, polyphagia, nocturia, blurred vision, neuropathy . Current exercise: Not much . Current blood glucose readings: 119,115,129,167,162,129,157,123,109,118, [phone] . Cardiovascular risk reduction: o Current hypertensive regimen:  - Carvedilol 12.5mg  twice daily with a meal - Doxazosin 4mg  1/2 tablet daily (prostate) - Losartan/HCTZ 100-25mg  daily - Amlodipine 10mg  1/2 tablet twice daily o Current hyperlipidemia regimen: N/A o Current antiplatelet regimen: N/A  Pharmacist Clinical Goal(s):  Marland Kitchen Over the next 6 weeks, patient will work with PharmD, endocrinologist, and primary care provider to address elevated blood sugars and A1c above goal.  Interventions: . Comprehensive medication review performed, medication list updated in electronic medical record . Patient's blood sugars have improved since our last appointment . Encouraged healthy diet and 150 minutes of exercise per week  Patient Self Care Activities:  . Patient will check blood glucose twice daily over the next 6 weeks, document, and provide at future appointments . Patient will focus on medication adherence by continued use of weekly pill box  over the next 6 weeks . Patient will increase exercise to 30 minutes 5 times weekly over the next 6 weeks . Patient will limit soda intake over the next 6 weeks . Patient will take medications as prescribed . Patient will contact provider with any episodes of hypoglycemia . Patient will report any questions or concerns to provider   Please see past updates related to this goal by clicking on the "Past Updates" button in the selected goal      . High Cholesterol Management   Not on track    CARE PLAN ENTRY  Current Barriers:  . Controlled hyperlipidemia, complicated by diabetes . Current antihyperlipidemic regimen: N/A . Previous antihyperlipidemic medications: Patient has tried statins in the past . Most recent lipid panel:  Lipid Panel     Component Value Date/Time   CHOL 170 12/19/2019 0135   CHOL 158 03/23/2019 1339   TRIG 63 12/19/2019 0135   HDL 62 12/19/2019 0135   HDL 76 03/23/2019 1339   CHOLHDL 2.7 12/19/2019 0135   VLDL 13 12/19/2019 0135   LDLCALC 95 12/19/2019 0135   LDLCALC 68 03/23/2019 1339   LABVLDL 14 03/23/2019 1339    . ASCVD risk enhancing conditions: age >50, DM, HTN, CKD  Pharmacist Clinical Goal(s):  Marland Kitchen Over the next 6 weeks patient will work with PharmD and providers towards optimized antihyperlipidemic therapy  Interventions: . Comprehensive medication review performed; medication list updated in electronic medical record.  . Discussed retrial of statin versus alternative therapy with PCP. Per PCP, will consider Nexletol at future appointment  Patient Self Care Activities:  . Patient will focus on heart healthy diet and 150 minutes of exercise per week over the next 6 weeks  Please see  past updates related to this goal by clicking on the "Past Updates" button in the selected goal      . Hypertension: Goal BP <140/90   Not on track    CARE PLAN ENTRY  Current Barriers:  . Uncontrolled hypertension, complicated by diabetes and chronic kidney  disease . Current antihypertensive regimen:  o Carvedilol 12.5mg  twice daily with a meal o Doxazosin 4mg  1/2 tablet daily (prostate) o Losartan/HCTZ 100-25mg  daily o Amlodipine 10mg  1/2 tablet twice daily . Previous antihypertensives tried: Diltiazem, hydralazine, metoprolol . Last practice recorded BP readings:  BP Readings from Last 3 Encounters:  12/26/19 (!) 146/78  12/20/19 138/88  11/01/19 (!) 204/91   . Current home BP readings:  153/89, 159/93, 169/91, 138/70, 162/100, 156/82, 178/97, 147/81, 153/85, 144/75, 165/88, 132/71, 159/89, 138/79  Pharmacist Clinical Goal(s):  Marland Kitchen Over the next 6 weeks, patient will work with PharmD and providers to optimize antihypertensive regimen  Interventions: . Comprehensive medication review performed; medication list updated in the electronic medical record.  . Counseled patient on importance of diet and exercise modifications  Patient Self Care Activities:  . Patient will continue to check BP daily and if symptomatic over the next 6 weeks, document, and provide at future appointments . Patient will focus on medication adherence by continued use of pillbox over the next 6 weeks  Please see past updates related to this goal by clicking on the "Past Updates" button in the selected goal         Patient verbalizes understanding of instructions provided today.   Telephone follow up appointment with pharmacy team member scheduled for: 03/07/20 @ 8:30 AM  Jannette Fogo, PharmD Clinical Pharmacist [facility] [phone]

## 2020-01-26 ENCOUNTER — Telehealth: Payer: Self-pay

## 2020-01-26 ENCOUNTER — Ambulatory Visit (INDEPENDENT_AMBULATORY_CARE_PROVIDER_SITE_OTHER): Payer: Medicare Other | Admitting: Ophthalmology

## 2020-01-26 ENCOUNTER — Encounter (INDEPENDENT_AMBULATORY_CARE_PROVIDER_SITE_OTHER): Payer: Self-pay | Admitting: Ophthalmology

## 2020-01-26 DIAGNOSIS — I10 Essential (primary) hypertension: Secondary | ICD-10-CM

## 2020-01-26 DIAGNOSIS — Z8669 Personal history of other diseases of the nervous system and sense organs: Secondary | ICD-10-CM | POA: Diagnosis not present

## 2020-01-26 DIAGNOSIS — E113313 Type 2 diabetes mellitus with moderate nonproliferative diabetic retinopathy with macular edema, bilateral: Secondary | ICD-10-CM

## 2020-01-26 DIAGNOSIS — Z961 Presence of intraocular lens: Secondary | ICD-10-CM

## 2020-01-26 DIAGNOSIS — H3581 Retinal edema: Secondary | ICD-10-CM | POA: Diagnosis not present

## 2020-01-26 DIAGNOSIS — H35033 Hypertensive retinopathy, bilateral: Secondary | ICD-10-CM | POA: Diagnosis not present

## 2020-01-31 ENCOUNTER — Encounter (INDEPENDENT_AMBULATORY_CARE_PROVIDER_SITE_OTHER): Payer: Self-pay | Admitting: Ophthalmology

## 2020-01-31 DIAGNOSIS — N1831 Chronic kidney disease, stage 3a: Secondary | ICD-10-CM | POA: Diagnosis not present

## 2020-01-31 DIAGNOSIS — E113313 Type 2 diabetes mellitus with moderate nonproliferative diabetic retinopathy with macular edema, bilateral: Secondary | ICD-10-CM | POA: Diagnosis not present

## 2020-01-31 DIAGNOSIS — H35033 Hypertensive retinopathy, bilateral: Secondary | ICD-10-CM | POA: Diagnosis not present

## 2020-01-31 DIAGNOSIS — E1122 Type 2 diabetes mellitus with diabetic chronic kidney disease: Secondary | ICD-10-CM | POA: Diagnosis not present

## 2020-01-31 DIAGNOSIS — Z794 Long term (current) use of insulin: Secondary | ICD-10-CM | POA: Diagnosis not present

## 2020-01-31 DIAGNOSIS — I10 Essential (primary) hypertension: Secondary | ICD-10-CM | POA: Diagnosis not present

## 2020-01-31 MED ORDER — AFLIBERCEPT 2MG/0.05ML IZ SOLN FOR KALEIDOSCOPE
2.0000 mg | INTRAVITREAL | Status: AC | PRN
  Administered 2020-01-31: 2 mg via INTRAVITREAL

## 2020-01-31 MED ORDER — AFLIBERCEPT 2MG/0.05ML IZ SOLN FOR KALEIDOSCOPE
2.0000 mg | INTRAVITREAL | Status: AC | PRN
Start: 2020-01-31 — End: 2020-01-31
  Administered 2020-01-31: 2 mg via INTRAVITREAL

## 2020-02-01 ENCOUNTER — Telehealth: Payer: Self-pay

## 2020-02-01 DIAGNOSIS — I10 Essential (primary) hypertension: Secondary | ICD-10-CM

## 2020-02-01 MED ORDER — CARVEDILOL 12.5 MG PO TABS: 12.5000 mg | ORAL_TABLET | Freq: Two times a day (BID) | ORAL | 1 refills | Status: DC

## 2020-02-01 NOTE — Telephone Encounter (Signed)
The pt wants to know if he is supposed to still take amlodipine 5 mg in the morning and 5 mg at night?

## 2020-02-08 ENCOUNTER — Other Ambulatory Visit: Payer: Self-pay

## 2020-02-08 MED ORDER — AMLODIPINE BESYLATE 5 MG PO TABS: ORAL_TABLET | ORAL | 1 refills | Status: DC

## 2020-02-08 NOTE — Telephone Encounter (Signed)
The pt returned a call to the office and confirmed that he takes 5 mg 2 times per day

## 2020-02-08 NOTE — Telephone Encounter (Signed)
The pt's wife Mrs. Ricklefs was asked if she could have the pt to call back so that I can verify how he has been taking the amlodipine medication before I sent the refill to [facility].

## 2020-02-12 ENCOUNTER — Encounter: Payer: Self-pay | Admitting: Internal Medicine

## 2020-02-12 ENCOUNTER — Telehealth: Payer: Self-pay | Admitting: Podiatry

## 2020-02-12 ENCOUNTER — Other Ambulatory Visit: Payer: Self-pay

## 2020-02-12 DIAGNOSIS — I10 Essential (primary) hypertension: Secondary | ICD-10-CM

## 2020-02-12 NOTE — Telephone Encounter (Signed)
error 

## 2020-02-23 NOTE — Progress Notes (Signed)
[facility] Note  02/28/2020     CHIEF COMPLAINT Patient presents for Retina Follow Up   HISTORY OF PRESENT ILLNESS: Marcus Johnston is a 86 y.o. male who presents to the clinic today for:   HPI    Retina Follow Up    Patient presents with  Diabetic Retinopathy.  In both eyes.  Severity is moderate.  Duration of 4 weeks.  Since onset it is stable.  I, the attending physician,  performed the HPI with the patient and updated documentation appropriately.          Comments    Patient states vision the same OU. BS was 140 this am. Last a1c was 7.2, checked 6 months ago. Using Pred Forte and prolensa qid OU.        Last edited by [name], MD on 02/28/2020  9:36 AM. (History)    Patient    Referring physician: Glendale Chard, MD [address] [place],  [place] [address]  HISTORICAL INFORMATION:   Selected notes from the MEDICAL RECORD NUMBER Referred by Dr. [name] for CME OU   CURRENT MEDICATIONS: Current Outpatient Medications (Ophthalmic Drugs)  Medication Sig  . Bromfenac Sodium (PROLENSA) 0.07 % SOLN Place 1 drop into both eyes 4 (four) times daily.  . prednisoLONE acetate (PRED FORTE) 1 % ophthalmic suspension Place 1 drop into both eyes 4 (four) times daily.   No current facility-administered medications for this visit. (Ophthalmic Drugs)   Current Outpatient Medications (Other)  Medication Sig  . acetaminophen (TYLENOL) 325 MG tablet Take 650 mg by mouth every 6 (six) hours as needed for mild pain or fever.  Marland Kitchen amLODipine (NORVASC) 5 MG tablet Take 1 tablet by mouth 2 times per day  . carvedilol (COREG) 12.5 MG tablet Take 1 tablet (12.5 mg total) by mouth 2 (two) times daily with a meal.  . Cholecalciferol (VITAMIN D3) 25 MCG (1000 UT) CAPS Take 1,000 Units by mouth daily.   Marland Kitchen doxazosin (CARDURA) 4 MG tablet Take 0.5 tablets (2 mg total) by mouth daily with supper.  . fexofenadine (ALLEGRA) 180 MG tablet Take 180 mg  by mouth daily as needed (allergies.).   Marland Kitchen insulin lispro protamine-lispro (HUMALOG 50/50 MIX) (50-50) 100 UNIT/ML SUSP injection Inject 22-25 Units into the skin See admin instructions. Inject 25 units subcutaneously with breakfast and 22 units with supper  . Insulin Syringe-Needle U-100 (EASY TOUCH INSULIN SYRINGE) 30G X 5/16" 0.5 ML MISC USE 3 TIMES DAILY  . losartan-hydrochlorothiazide (HYZAAR) 100-25 MG tablet TAKE 1 TABLET BY MOUTH  DAILY  . metFORMIN (GLUCOPHAGE-XR) 500 MG 24 hr tablet Take 1,000 mg by mouth 2 (two) times daily with a meal.    No current facility-administered medications for this visit. (Other)      REVIEW OF SYSTEMS: ROS    Positive for: Neurological, Genitourinary, Endocrine, Cardiovascular, Eyes, Respiratory   Negative for: Constitutional, Gastrointestinal, Skin, Musculoskeletal, HENT, Psychiatric, Allergic/Imm, Heme/Lymph   Last edited by Roselee Nova D, COT on 02/28/2020  9:11 AM. (History)       ALLERGIES Allergies  Allergen Reactions  . Bidil [Isosorb Dinitrate-Hydralazine] Other (See Comments)    Severe hypotension episode after administering one dose of Bidil 20-37.5 mg on 09/25/2019.   . Statins Other (See Comments)    Leg pain    PAST MEDICAL HISTORY Past Medical History:  Diagnosis Date  . Atrial fibrillation ([place])   . Atypical atrial flutter ([place]) 10/17/2018  . Diabetes  mellitus without complication ([place])   . Diabetic retinopathy ([place])    NPDR OU  . High cholesterol   . History of prostate cancer   . HTN (hypertension)   . Hypertensive retinopathy    OU  . Renal insufficiency   . Shingles    Past Surgical History:  Procedure Laterality Date  . ATRIAL TACH ABLATION N/A 05/08/2019   Procedure: ATRIAL TACH ABLATION;  Surgeon: [name], MD;  Location: [facility];  Service: Cardiovascular;  Laterality: N/A;  . CATARACT EXTRACTION Bilateral   . EYE SURGERY Bilateral    Cat Sx  . prostate implant      FAMILY  HISTORY Family History  Problem Relation Age of Onset  . Cancer Mother   . Cancer Father     SOCIAL HISTORY Social History   Tobacco Use  . Smoking status: Never Smoker  . Smokeless tobacco: Never Used  Vaping Use  . Vaping Use: Never used  Substance Use Topics  . Alcohol use: No  . Drug use: No         OPHTHALMIC EXAM:  Base Eye Exam    Visual Acuity (Snellen - Linear)      Right Left   Dist cc 20/60 +2 20/100 -2   Dist ph cc 20/30 -2 20/80 -2   Correction: Glasses       Tonometry (Tonopen, 9:22 AM)      Right Left   Pressure 13 13       Pupils      Dark Light Shape React APD   Right 3 2 Round Brisk None   Left 3 2 Round Brisk None       Visual Fields (Counting fingers)      Left Right    Full Full       Extraocular Movement      Right Left    Full, Ortho Full, Ortho       Neuro/Psych    Oriented x3: Yes   Mood/Affect: Normal       Dilation    Both eyes: 1.0% Mydriacyl, 2.5% Phenylephrine @ 9:22 AM        Slit Lamp and Fundus Exam    Slit Lamp Exam      Right Left   Lids/Lashes Dermatochalasis - upper lid, mild Meibomian gland dysfunction Dermatochalasis - upper lid, mild Meibomian gland dysfunction   Conjunctiva/Sclera Mild Melanosis Mild Melanosis   Cornea Trace Punctate epithelial erosions 1+ Punctate epithelial erosions. endopigment   Anterior Chamber Deep and quiet Deep and quiet   Iris Round and dilated, No NVI Round and dilated, No NVI   Lens Toric PC IOL with marks at 1200 and 0600 PC IOL in good position with PC folds   Vitreous Vitreous syneresis, PVD and vitreous condensations, no residual IVTA Vitreous syneresis, no residual IVTA       Fundus Exam      Right Left   Disc Mild Pallor, mild, temporal Peripapillary atrophy, Sharp rim temporal Peripapillary atrophy, +cupping, mild Pallor, Sharp rim   C/D Ratio 0.6 0.65   Macula Blunted foveal reflex, central IRH improved; pigment clumping, Microaneurysms, persistent central  cystic changes--improved Blunted foveal reflex, Retinal pigment epithelial mottling and clumping, focal Exudates, +IRH improved, +central Cystic changes -- improved, scattered Microaneurysms   Vessels Vascular attenuation, Tortuous Vascular attenuation, mild Copper wiring, AV crossing changes   Periphery Attached, large HST at 0500 with surrounding SRF, +pigmented barrier laser scar from 0430-0600  Attached, small pigmented operculum at 0730 midzone--stable, no SRF           Refraction    Wearing Rx      Sphere Cylinder Axis Add   Right -2.25 +0.75 143 +2.25   Left -3.50 +2.00 090 +2.25          IMAGING AND PROCEDURES  Imaging and Procedures for @TODAY @  OCT, Retina - OU - Both Eyes       Right Eye Quality was good. Central Foveal Thickness: 304. Progression has improved. Findings include abnormal foveal contour, intraretinal fluid, no SRF, intraretinal hyper-reflective material (Mild Interval improvement in IRF; Trace ERM, vitreous opacities also improved).   Left Eye Quality was good. Central Foveal Thickness: 258. Progression has improved. Findings include intraretinal fluid, abnormal foveal contour, intraretinal hyper-reflective material, no SRF, outer retinal atrophy (Mild ERM; interval improvement in IRF -- stable IRHM).   Notes *Images captured and stored on drive  Diagnosis / Impression:  OD: Mild Interval improvement in IRF; Trace ERM, vitreous opacities also improved OS: Mild ERM; interval improvement in IRF -- stable IRHM  Clinical management:  See below  Abbreviations: NFP - Normal foveal profile. CME - cystoid macular edema. PED - pigment epithelial detachment. IRF - intraretinal fluid. SRF - subretinal fluid. EZ - ellipsoid zone. ERM - epiretinal membrane. ORA - outer retinal atrophy. ORT - outer retinal tubulation. SRHM - subretinal hyper-reflective material        Intravitreal Injection, Pharmacologic Agent - OD - Right Eye       Time Out 02/28/2020.  9:43 AM. Confirmed correct patient, procedure, site, and patient consented.   Anesthesia Topical anesthesia was used. Anesthetic medications included Lidocaine 2%, Proparacaine 0.5%.   Procedure Preparation included 5% betadine to ocular surface, eyelid speculum. A (32g) needle was used.   Injection:  2 mg aflibercept Alfonse Flavors) SOLN   NDC: A3590391, Lot: [id number], Expiration date: 05/17/2020   Route: Intravitreal, Site: Right Eye, Waste: 0.05 mL  Post-op Post injection exam found visual acuity of at least counting fingers. The patient tolerated the procedure well. There were no complications. The patient received written and verbal post procedure care education.        Intravitreal Injection, Pharmacologic Agent - OS - Left Eye       Time Out 02/28/2020. 9:43 AM. Confirmed correct patient, procedure, site, and patient consented.   Anesthesia Topical anesthesia was used. Anesthetic medications included Lidocaine 2%.   Procedure Preparation included 5% betadine to ocular surface, eyelid speculum. A (32g) needle was used.   Injection:  2 mg aflibercept Alfonse Flavors) SOLN   NDC: A3590391, Lot: [id number], Expiration date: 04/17/2020   Route: Intravitreal, Site: Left Eye, Waste: 0.05 mL  Post-op Post injection exam found visual acuity of at least counting fingers. The patient tolerated the procedure well. There were no complications. The patient received written and verbal post procedure care education.                 ASSESSMENT/PLAN:    ICD-10-CM   1. Moderate nonproliferative diabetic retinopathy of both eyes with macular edema associated with type 2 diabetes mellitus (HCC)  J24.2683 Intravitreal Injection, Pharmacologic Agent - OD - Right Eye    Intravitreal Injection, Pharmacologic Agent - OS - Left Eye    aflibercept (EYLEA) SOLN 2 mg    aflibercept (EYLEA) SOLN 2 mg  2. Retinal edema  H35.81 OCT, Retina - OU - Both Eyes  3. History of retinal detachment  Z86.69  4. Essential hypertension  I10   5. Hypertensive retinopathy of both eyes  H35.033   6. Pseudophakia of both eyes  Z96.1     1,2.  Non-proliferative diabetic retinopathy w/ macular edema OU  - patient with long standing history of macular edema -- was seen by Dr. Zigmund Daniel back in 2013  - has also seen Dr. Baird Cancer, Dr. Posey Pronto, [place] [facility] at [facility]  - last retina f/u in 2019 with Dr. Posey Pronto and at [facility]  - pt has previously received intravitreal injections OU, but does not like them and felt like benefits from injections were minmal  - at initial visit, was using PF and ketorolac QID OU -- pt self d/c  - exam shows scattered/minimal MA but central edema OU  - FA (12.11.20) shows late leaking MA, OS shows capillary nonperfusion, OD with ?petaloid central hyperfluorescence -- ?CME component  - s/p IVA# 1 OD (12.11.20)  - s/p IVE #1 OD (01.20.21), #2 (02.17.21), #3 (03.17.21), #4 (05.13.21), #5 (06.11.21)  - s/p IVE# 1 OS (12.11.20), #2 (01.20.21), #3 (02.17.21), #4 (03.17.21), #5 (04.15.21), #6 (4.15.21), #7 (06.11.21)             - s/p IVTA OD #1 (04.15.21)  - s/p IVTA OS #1 (05.13.21)  - BCVA OD: 20/40; OS: 20/80  - OCT shows mild interval improvement in IRF OU -- good response to IVE  - Eylea4U benefits investigation initiated 12.11.20 -- approved for 2021             - Recommend IVE OU  today, 07.14.21  - pt wishes to proceed  - RBA of procedure discussed, questions answered  - informed consent obtained  - see procedure notes  - Eylea informed consent form signed and scanned on 01.20.21  - IVTA informed consent form signed and scanned on 04.15.20  - cont PF QID OU and Prolensa QID OU  - f/u in 4 wks -- DFE/OCT/possible injection **will go back to IVTA if IVE fails to improve IRF/DME**  3. History of inferior HST w/ focal RD OD s/p laser retinopexy  - HST at 0500 w/ surrounding SRF  - good barricade laser in place spanning 0430-0600  - stable  4,5. Hypertensive  retinopathy OU  - discussed importance of tight BP control  - monitor   6. Pseudophakia OU  - s/p CE/IOL OU  - beautiful surgeries, doing well  - monitor   Ophthalmic Meds Ordered this visit:  Meds ordered this encounter  Medications  . aflibercept (EYLEA) SOLN 2 mg  . aflibercept (EYLEA) SOLN 2 mg       Return for 4-5 wks - f/u NPDR w/ DME OU -- Dilated Exam, OCT, Possible Injxn.  There are no Patient Instructions on file for this visit.   Explained the diagnoses, plan, and follow up with the patient and they expressed understanding.  Patient expressed understanding of the importance of proper follow up care.   This document serves as a record of services personally performed by Gardiner Sleeper, MD, PhD. It was created on their behalf by Roselee Nova, COMT. The creation of this record is the provider's dictation and/or activities during the visit.  Electronically signed by: Roselee Nova, COMT 02/28/20 12:09 PM  Gardiner Sleeper, M.D., Ph.D. Diseases & Surgery of the Retina and Vitreous [facility]  I have reviewed the above documentation for accuracy and completeness, and I agree with the above. Gardiner Sleeper, M.D., Ph.D. 02/28/20 12:09  PM   Abbreviations: M myopia (nearsighted); A astigmatism; H hyperopia (farsighted); P presbyopia; Mrx spectacle prescription;  CTL contact lenses; OD right eye; OS left eye; OU both eyes  XT exotropia; ET esotropia; PEK punctate epithelial keratitis; PEE punctate epithelial erosions; DES dry eye syndrome; MGD meibomian gland dysfunction; ATs artificial tears; PFAT's preservative free artificial tears; [place] nuclear sclerotic cataract; PSC posterior subcapsular cataract; ERM epi-retinal membrane; PVD posterior vitreous detachment; RD retinal detachment; DM diabetes mellitus; DR diabetic retinopathy; NPDR non-proliferative diabetic retinopathy; PDR proliferative diabetic retinopathy; CSME clinically significant macular  edema; DME diabetic macular edema; dbh dot blot hemorrhages; CWS cotton wool spot; POAG primary open angle glaucoma; C/D cup-to-disc ratio; HVF humphrey visual field; GVF goldmann visual field; OCT optical coherence tomography; IOP intraocular pressure; BRVO Branch retinal vein occlusion; CRVO central retinal vein occlusion; CRAO central retinal artery occlusion; BRAO branch retinal artery occlusion; RT retinal tear; SB scleral buckle; PPV pars plana vitrectomy; VH Vitreous hemorrhage; PRP panretinal laser photocoagulation; IVK intravitreal kenalog; VMT vitreomacular traction; MH Macular hole;  NVD neovascularization of the disc; NVE neovascularization elsewhere; AREDS age related eye disease study; ARMD age related macular degeneration; POAG primary open angle glaucoma; EBMD epithelial/anterior basement membrane dystrophy; ACIOL anterior chamber intraocular lens; IOL intraocular lens; PCIOL posterior chamber intraocular lens; Phaco/IOL phacoemulsification with intraocular lens placement; [place] photorefractive keratectomy; LASIK laser assisted in situ keratomileusis; HTN hypertension; DM diabetes mellitus; COPD chronic obstructive pulmonary disease

## 2020-02-26 ENCOUNTER — Other Ambulatory Visit: Payer: Self-pay | Admitting: Internal Medicine

## 2020-02-28 ENCOUNTER — Ambulatory Visit (INDEPENDENT_AMBULATORY_CARE_PROVIDER_SITE_OTHER): Payer: Medicare Other | Admitting: Ophthalmology

## 2020-02-28 ENCOUNTER — Other Ambulatory Visit: Payer: Self-pay

## 2020-02-28 ENCOUNTER — Encounter (INDEPENDENT_AMBULATORY_CARE_PROVIDER_SITE_OTHER): Payer: Self-pay | Admitting: Ophthalmology

## 2020-02-28 DIAGNOSIS — H3581 Retinal edema: Secondary | ICD-10-CM

## 2020-02-28 DIAGNOSIS — E113313 Type 2 diabetes mellitus with moderate nonproliferative diabetic retinopathy with macular edema, bilateral: Secondary | ICD-10-CM | POA: Diagnosis not present

## 2020-02-28 DIAGNOSIS — H35033 Hypertensive retinopathy, bilateral: Secondary | ICD-10-CM

## 2020-02-28 DIAGNOSIS — Z8669 Personal history of other diseases of the nervous system and sense organs: Secondary | ICD-10-CM | POA: Diagnosis not present

## 2020-02-28 DIAGNOSIS — I10 Essential (primary) hypertension: Secondary | ICD-10-CM | POA: Diagnosis not present

## 2020-02-28 DIAGNOSIS — Z961 Presence of intraocular lens: Secondary | ICD-10-CM

## 2020-02-28 MED ORDER — AFLIBERCEPT 2MG/0.05ML IZ SOLN FOR KALEIDOSCOPE
2.0000 mg | INTRAVITREAL | Status: AC | PRN
  Administered 2020-02-28: 2 mg via INTRAVITREAL

## 2020-03-04 ENCOUNTER — Ambulatory Visit (INDEPENDENT_AMBULATORY_CARE_PROVIDER_SITE_OTHER): Payer: Medicare Other | Admitting: Podiatry

## 2020-03-04 ENCOUNTER — Encounter: Payer: Self-pay | Admitting: Podiatry

## 2020-03-04 ENCOUNTER — Other Ambulatory Visit: Payer: Self-pay

## 2020-03-04 DIAGNOSIS — M79676 Pain in unspecified toe(s): Secondary | ICD-10-CM | POA: Diagnosis not present

## 2020-03-04 DIAGNOSIS — Z794 Long term (current) use of insulin: Secondary | ICD-10-CM

## 2020-03-04 DIAGNOSIS — E119 Type 2 diabetes mellitus without complications: Secondary | ICD-10-CM

## 2020-03-04 DIAGNOSIS — B351 Tinea unguium: Secondary | ICD-10-CM | POA: Diagnosis not present

## 2020-03-04 NOTE — Progress Notes (Signed)
Subjective: Marcus Johnston presents today for follow up of preventative diabetic foot care and painful mycotic nails b/l that are difficult to trim. Pain interferes with ambulation. Aggravating factors include wearing enclosed shoe gear. Pain is relieved with periodic professional debridement.   He is concerned about length of time between visits. States toenails are painful due to be long. He is asking to be allowed to come back sooner.  Dr. Glendale Chard is his PCP. Last visit was 12/26/2019.  Allergies  Allergen Reactions  . Bidil [Isosorb Dinitrate-Hydralazine] Other (See Comments)    Severe hypotension episode after administering one dose of Bidil 20-37.5 mg on 09/25/2019.   . Statins Other (See Comments)    Leg pain     Objective: There were no vitals filed for this visit.  Pt is a pleasant 86 y.o. year old AA male  in NAD. AAO x 3.   Vascular Examination:  Capillary refill time to digits immediate b/l. Palpable DP pulses b/l. Palpable PT pulses b/l. Pedal hair sparse b/l. Skin temperature gradient within normal limits b/l.  Dermatological Examination: Pedal skin with normal turgor, texture and tone bilaterally. No open wounds bilaterally. No interdigital macerations bilaterally. Toenails 1-5 b/l elongated, dystrophic, thickened, crumbly with subungual debris and tenderness to dorsal palpation.  Musculoskeletal: Normal muscle strength 5/5 to all lower extremity muscle groups bilaterally, no gross bony deformities bilaterally and no pain crepitus or joint limitation noted with ROM b/l  Neurological: Protective sensation intact 5/5 intact bilaterally with 10g monofilament b/l. Vibratory sensation intact b/l. Babinski reflex negative b/l. Clonus negative b/l.  Assessment: 1. Pain due to onychomycosis of toenail   2. Type 2 diabetes mellitus without complication, with long-term current use of insulin ([place])    Plan: -Continue diabetic foot care principles. Literature dispensed on  today.  -Toenails 1-5 b/l were debrided in length and girth with sterile nail nippers and dremel without iatrogenic bleeding.  -Patient to continue soft, supportive shoe gear daily. -Patient to report any pedal injuries to medical professional immediately. -Patient/POA to call should there be question/concern in the interim. -My schedule is booked, but he will get a call for 9 weeks should someone cancel their appointment in early October. He related understanding.  Return in about 3 months (around 06/04/2020) for diabetic nail trim.

## 2020-03-04 NOTE — Patient Instructions (Signed)
Diabetes Mellitus and Foot Care Foot care is an important part of your health, especially when you have diabetes. Diabetes may cause you to have problems because of poor blood flow (circulation) to your feet and legs, which can cause your skin to:  Become thinner and drier.  Break more easily.  Heal more slowly.  Peel and crack. You may also have nerve damage (neuropathy) in your legs and feet, causing decreased feeling in them. This means that you may not notice minor injuries to your feet that could lead to more serious problems. Noticing and addressing any potential problems early is the best way to prevent future foot problems. How to care for your feet Foot hygiene  Wash your feet daily with warm water and mild soap. Do not use hot water. Then, pat your feet and the areas between your toes until they are completely dry. Do not soak your feet as this can dry your skin.  Trim your toenails straight across. Do not dig under them or around the cuticle. File the edges of your nails with an emery board or nail file.  Apply a moisturizing lotion or petroleum jelly to the skin on your feet and to dry, brittle toenails. Use lotion that does not contain alcohol and is unscented. Do not apply lotion between your toes. Shoes and socks  Wear clean socks or stockings every day. Make sure they are not too tight. Do not wear knee-high stockings since they may decrease blood flow to your legs.  Wear shoes that fit properly and have enough cushioning. Always look in your shoes before you put them on to be sure there are no objects inside.  To break in new shoes, wear them for just a few hours a day. This prevents injuries on your feet. Wounds, scrapes, corns, and calluses  Check your feet daily for blisters, cuts, bruises, sores, and redness. If you cannot see the bottom of your feet, use a mirror or ask someone for help.  Do not cut corns or calluses or try to remove them with medicine.  If you  find a minor scrape, cut, or break in the skin on your feet, keep it and the skin around it clean and dry. You may clean these areas with mild soap and water. Do not clean the area with peroxide, alcohol, or iodine.  If you have a wound, scrape, corn, or callus on your foot, look at it several times a day to make sure it is healing and not infected. Check for: ? Redness, swelling, or pain. ? Fluid or blood. ? Warmth. ? Pus or a bad smell. General instructions  Do not cross your legs. This may decrease blood flow to your feet.  Do not use heating pads or hot water bottles on your feet. They may burn your skin. If you have lost feeling in your feet or legs, you may not know this is happening until it is too late.  Protect your feet from hot and cold by wearing shoes, such as at the beach or on hot pavement.  Schedule a complete foot exam at least once a year (annually) or more often if you have foot problems. If you have foot problems, report any cuts, sores, or bruises to your health care provider immediately. Contact a health care provider if:  You have a medical condition that increases your risk of infection and you have any cuts, sores, or bruises on your feet.  You have an injury that is not   healing.  You have redness on your legs or feet.  You feel burning or tingling in your legs or feet.  You have pain or cramps in your legs and feet.  Your legs or feet are numb.  Your feet always feel cold.  You have pain around a toenail. Get help right away if:  You have a wound, scrape, corn, or callus on your foot and: ? You have pain, swelling, or redness that gets worse. ? You have fluid or blood coming from the wound, scrape, corn, or callus. ? Your wound, scrape, corn, or callus feels warm to the touch. ? You have pus or a bad smell coming from the wound, scrape, corn, or callus. ? You have a fever. ? You have a red line going up your leg. Summary  Check your feet every day  for cuts, sores, red spots, swelling, and blisters.  Moisturize feet and legs daily.  Wear shoes that fit properly and have enough cushioning.  If you have foot problems, report any cuts, sores, or bruises to your health care provider immediately.  Schedule a complete foot exam at least once a year (annually) or more often if you have foot problems. This information is not intended to replace advice given to you by your health care provider. Make sure you discuss any questions you have with your health care provider. Document Revised: 04/26/2019 Document Reviewed: 09/04/2016 Elsevier Patient Education  2020 [employer]  

## 2020-03-04 NOTE — Progress Notes (Signed)
Cardiology Office Note:    Date:  03/04/2020   ID:  Marcus Johnston, DOB 02/25/1931, MRN [id number]  PCP:  Glendale Chard, MD  Cardiologist:  Buford Dresser, MD  EP: Dr. Lovena Le  Referring MD: Glendale Chard, MD   CC: new patient evaluation  History of Present Illness:    Marcus Johnston is a 86 y.o. male with a hx of hypertension, hyperlipidemia, type II diabetes, prior CVA, chronic kidney disease, atrial tachycardia s/p ablation who is seen as a new consult at the request of Glendale Chard, MD for the evaluation and management of hypertension and cardiovascular risk. He was previously followed by [facility]. He sees Dr. Lovena Le in EP for history of atrial tachycardia s/p ablation. He has not been seen by general cardiology in our practice.  Today: Concerned about his medications, as they have come from multiple different sources. He is also concerned about his BP readings. Had his meter tested at last visit with Dr. Baird Cancer, agreed well with office cuff. Always runs 315V systolic at home. Lowest 761 systolic, nothing over 607 systolic in the last several weeks.   Had a episode in 12/2019, notes reviewed, where he had about 5 minutes of stuttering/difficulty getting his words out.  Did med rec today, now updated. No issues with any of these medications.   ROS positive for sensation of "contraction" in his chest occasionally at night.  Denies chest pain, shortness of breath at rest or with normal exertion. No PND, orthopnea, LE edema or unexpected weight gain. No syncope or palpitations.  Has been on jardiance for several weeks, very expensive but tolerating.   Past Medical History:  Diagnosis Date   Atrial fibrillation ([place])    Atypical atrial flutter ([place]) 10/17/2018   Diabetes mellitus without complication (HCC)    Diabetic retinopathy (HCC)    NPDR OU   High cholesterol    History of prostate cancer    HTN (hypertension)    Hypertensive retinopathy     OU   Renal insufficiency    Shingles     Past Surgical History:  Procedure Laterality Date   ATRIAL TACH ABLATION N/A 05/08/2019   Procedure: ATRIAL TACH ABLATION;  Surgeon: [name], MD;  Location: [facility];  Service: Cardiovascular;  Laterality: N/A;   CATARACT EXTRACTION Bilateral    EYE SURGERY Bilateral    Cat Sx   prostate implant      Current Medications: Current Outpatient Medications on File Prior to Visit  Medication Sig   acetaminophen (TYLENOL) 325 MG tablet Take 650 mg by mouth every 6 (six) hours as needed for mild pain or fever.   amLODipine (NORVASC) 5 MG tablet Take 1 tablet by mouth 2 times per day   Bromfenac Sodium (PROLENSA) 0.07 % SOLN Place 1 drop into both eyes 4 (four) times daily.   carvedilol (COREG) 12.5 MG tablet Take 1 tablet (12.5 mg total) by mouth 2 (two) times daily with a meal.   Cholecalciferol (VITAMIN D3) 25 MCG (1000 UT) CAPS Take 1,000 Units by mouth daily.    doxazosin (CARDURA) 4 MG tablet Take 0.5 tablets (2 mg total) by mouth daily with supper.   fexofenadine (ALLEGRA) 180 MG tablet Take 180 mg by mouth daily as needed (allergies.).    insulin lispro protamine-lispro (HUMALOG 50/50 MIX) (50-50) 100 UNIT/ML SUSP injection Inject 22-25 Units into the skin See admin instructions. Inject 25 units subcutaneously with breakfast and 22 units with supper   Insulin Syringe-Needle  U-100 (EASY TOUCH INSULIN SYRINGE) 30G X 5/16" 0.5 ML MISC USE 3 TIMES DAILY   losartan-hydrochlorothiazide (HYZAAR) 100-25 MG tablet TAKE 1 TABLET BY MOUTH  DAILY   metFORMIN (GLUCOPHAGE-XR) 500 MG 24 hr tablet Take 1,000 mg by mouth 2 (two) times daily with a meal.    prednisoLONE acetate (PRED FORTE) 1 % ophthalmic suspension Place 1 drop into both eyes 4 (four) times daily.   No current facility-administered medications on file prior to visit.     Allergies:   Bidil [isosorb dinitrate-hydralazine] and Statins   Social History    Tobacco Use   Smoking status: Never Smoker   Smokeless tobacco: Never Used  Vaping Use   Vaping Use: Never used  Substance Use Topics   Alcohol use: No   Drug use: No    Family History: family history includes Cancer in his father and mother.  ROS:   Please see the history of present illness.  Additional pertinent ROS: Constitutional: Negative for chills, fever, night sweats, unintentional weight loss  HENT: Negative for ear pain and hearing loss.   Eyes: Negative for loss of vision and eye pain.  Respiratory: Negative for cough, sputum, wheezing.   Cardiovascular: See HPI. Gastrointestinal: Negative for abdominal pain, melena, and hematochezia.  Genitourinary: Negative for dysuria and hematuria.  Musculoskeletal: Negative for falls and myalgias.  Skin: Negative for itching and rash.  Neurological: Negative for focal weakness, focal sensory changes and loss of consciousness.  Endo/Heme/Allergies: Does not bruise/bleed easily.     EKGs/Labs/Other Studies Reviewed:    The following studies were reviewed today: Echo 12/18/19 1. Left ventricular ejection fraction, by estimation, is 60 to 65%. The  left ventricle has normal function. The left ventricle has no regional  wall motion abnormalities. There is moderate left ventricular hypertrophy.  Left ventricular diastolic  parameters are consistent with Grade I diastolic dysfunction (impaired  relaxation).  2. Right ventricular systolic function is normal. The right ventricular  size is normal.  3. The mitral valve is normal in structure. No evidence of mitral valve  regurgitation. No evidence of mitral stenosis.  4. The aortic valve is normal in structure. Aortic valve regurgitation is  not visualized. No aortic stenosis is present.  5. The inferior vena cava is normal in size with greater than 50%  respiratory variability, suggesting right atrial pressure of 3 mmHg.   EKG:  EKG is personally reviewed.  The ekg  ordered 12/20/19 demonstrates NSR  Recent Labs: 04/30/2019: TSH 2.437 12/18/2019: ALT 12 12/20/2019: BUN 16; Creatinine, Ser 1.08; Hemoglobin 13.9; Platelets 152; Potassium 3.8; Sodium 137  Recent Lipid Panel    Component Value Date/Time   CHOL 170 12/19/2019 0135   CHOL 158 03/23/2019 1339   TRIG 63 12/19/2019 0135   HDL 62 12/19/2019 0135   HDL 76 03/23/2019 1339   CHOLHDL 2.7 12/19/2019 0135   VLDL 13 12/19/2019 0135   LDLCALC 95 12/19/2019 0135   LDLCALC 68 03/23/2019 1339    Physical Exam:    VS:  BP (!) 146/74    Pulse 72    Temp (!) 97.5 F (36.4 C)    Ht 6\' 3"  (1.905 m)    Wt 226 lb (102.5 kg)    SpO2 96%    BMI 28.25 kg/m     Wt Readings from Last 3 Encounters:  12/26/19 232 lb 9.6 oz (105.5 kg)  12/17/19 240 lb 1.3 oz (108.9 kg)  09/25/19 240 lb (108.9 kg)    GEN: Well  nourished, well developed in no acute distress HEENT: Normal, moist mucous membranes NECK: No JVD CARDIAC: regular rhythm, normal S1 and S2, no rubs or gallops. No murmurs. VASCULAR: Radial and DP pulses 2+ bilaterally. No carotid bruits RESPIRATORY:  Clear to auscultation without rales, wheezing or rhonchi  ABDOMEN: Soft, non-tender, non-distended MUSCULOSKELETAL:  Ambulates independently SKIN: Warm and dry, trace bilateral LE edema NEUROLOGIC:  Alert and oriented x 3. No focal neuro deficits noted. PSYCHIATRIC:  Normal affect    ASSESSMENT:    1. Essential hypertension   2. Paroxysmal atrial tachycardia ([place])   3. H/O: CVA (cerebrovascular accident)   4. Pure hypercholesterolemia   5. Type 2 diabetes mellitus with other circulatory complications (HCC)   6. Cardiac risk counseling   7. Counseling on health promotion and disease prevention    PLAN:    Hypertension: -elevated in office today, but reports near goal at home. Home cuff has been validated. -reviewed how to check home BP. He will call if consistently elevate -with history of presyncope and given his age, would avoid  overtreating -continue amlodipine 5 mg BID (patient preference), carvedilol 12.5 mg BID, doxazosin 2 mg daily  Hyperlipidemia Type II diabetes: -given ASCVD and diabetes, agree with SGLT2i (empagliflozin) for reduction of risk -also on insulin -given diabetes, would generally recommend aspirin/statin. We discussed today, and given his age, prior issues with statins, we both agreed to not start new medication at this time  History of CVA: -MRI reviewed, moderate chronic microvascular ischemic changes, scattered punctate foci c/w chronic hypertenion  Paroxysmal atrial tachycardia: S/p ablation by Dr. Lovena Le Per Dr. Tanna Furry note 06/15/19, his rhythm does not require anticoagulation  Cardiac risk counseling and prevention recommendations: -recommend heart healthy/Mediterranean diet, with whole grains, fruits, vegetable, fish, lean meats, nuts, and olive oil. Limit salt. -recommend moderate walking, 3-5 times/week for 30-50 minutes each session. Aim for at least 150 minutes.week. Goal should be pace of 3 miles/hours, or walking 1.5 miles in 30 minutes -recommend avoidance of tobacco products. Avoid excess alcohol.  Plan for follow up: 6 mos or sooner as needed  Buford Dresser, MD, PhD [place]   [facility]    Medication Adjustments/Labs and Tests Ordered: Current medicines are reviewed at length with the patient today.  Concerns regarding medicines are outlined above.  No orders of the defined types were placed in this encounter.  No orders of the defined types were placed in this encounter.   Patient Instructions  Medication Instructions:  Your physician recommends that you continue on your current medications as directed. Please refer to the Current Medication list given to you today.  *If you need a refill on your cardiac medications before your next appointment, please call your pharmacy*   Follow-Up: At [facility], you and your health needs are our  priority.  As part of our continuing mission to provide you with exceptional heart care, we have created designated Provider Care Teams.  These Care Teams include your primary Cardiologist (physician) and Advanced Practice Providers (APPs -  Physician Assistants and Nurse Practitioners) who all work together to provide you with the care you need, when you need it.  We recommend signing up for the patient portal called "MyChart".  Sign up information is provided on this After Visit Summary.  MyChart is used to connect with patients for Virtual Visits (Telemedicine).  Patients are able to view lab/test results, encounter notes, upcoming appointments, etc.  Non-urgent messages can be sent to your provider as well.  To learn more about what you can do with MyChart, go to [email or web address].    Your next appointment:   6 month(s)  The format for your next appointment:   In Person  Provider:   You may see Buford Dresser, MD or one of the following Advanced Practice Providers on your designated Care Team:    Almyra Deforest, PA-C  Fabian Sharp, [place] or   Roby Lofts, [place]    Other Instructions     Signed, Buford Dresser, MD PhD 03/04/2020     [place]

## 2020-03-05 ENCOUNTER — Ambulatory Visit: Payer: Medicare Other | Admitting: Cardiology

## 2020-03-05 ENCOUNTER — Encounter: Payer: Self-pay | Admitting: Cardiology

## 2020-03-05 VITALS — BP 146/74 | HR 72 | Temp 97.5°F | Ht 75.0 in | Wt 226.0 lb

## 2020-03-05 DIAGNOSIS — I471 Supraventricular tachycardia: Secondary | ICD-10-CM

## 2020-03-05 DIAGNOSIS — Z7189 Other specified counseling: Secondary | ICD-10-CM

## 2020-03-05 DIAGNOSIS — E7800 Pure hypercholesterolemia, unspecified: Secondary | ICD-10-CM

## 2020-03-05 DIAGNOSIS — Z8673 Personal history of transient ischemic attack (TIA), and cerebral infarction without residual deficits: Secondary | ICD-10-CM

## 2020-03-05 DIAGNOSIS — E1159 Type 2 diabetes mellitus with other circulatory complications: Secondary | ICD-10-CM

## 2020-03-05 DIAGNOSIS — I10 Essential (primary) hypertension: Secondary | ICD-10-CM | POA: Diagnosis not present

## 2020-03-05 NOTE — Patient Instructions (Signed)
Medication Instructions:  Your physician recommends that you continue on your current medications as directed. Please refer to the Current Medication list given to you today.  *If you need a refill on your cardiac medications before your next appointment, please call your pharmacy*   Follow-Up: At [facility], you and your health needs are our priority.  As part of our continuing mission to provide you with exceptional heart care, we have created designated Provider Care Teams.  These Care Teams include your primary Cardiologist (physician) and Advanced Practice Providers (APPs -  Physician Assistants and Nurse Practitioners) who all work together to provide you with the care you need, when you need it.  We recommend signing up for the patient portal called "MyChart".  Sign up information is provided on this After Visit Summary.  MyChart is used to connect with patients for Virtual Visits (Telemedicine).  Patients are able to view lab/test results, encounter notes, upcoming appointments, etc.  Non-urgent messages can be sent to your provider as well.   To learn more about what you can do with MyChart, go to [email or web address].    Your next appointment:   6 month(s)  The format for your next appointment:   In Person  Provider:   You may see Buford Dresser, MD or one of the following Advanced Practice Providers on your designated Care Team:    Almyra Deforest, PA-C  Fabian Sharp, [place] or   Roby Lofts, [place]    Other Instructions

## 2020-03-07 ENCOUNTER — Ambulatory Visit: Payer: Self-pay

## 2020-03-07 ENCOUNTER — Other Ambulatory Visit: Payer: Self-pay

## 2020-03-07 DIAGNOSIS — I10 Essential (primary) hypertension: Secondary | ICD-10-CM

## 2020-03-07 DIAGNOSIS — E7800 Pure hypercholesterolemia, unspecified: Secondary | ICD-10-CM

## 2020-03-07 DIAGNOSIS — E1159 Type 2 diabetes mellitus with other circulatory complications: Secondary | ICD-10-CM

## 2020-03-07 NOTE — Patient Instructions (Signed)
Visit Information  Goals Addressed            This Visit's Progress   . Diabetes: Goal A1c <7%       CARE PLAN ENTRY  Current Barriers:  . Diabetes: Uncontrolled type 2; complicated by chronic medical conditions including kidney disease and hypertension Lab Results  Component Value Date   HGBA1C 8.0 (H) 12/18/2019 .   Lab Results  Component Value Date   CREATININE 1.08 12/20/2019   CREATININE 1.17 12/19/2019   CREATININE 1.65 (H) 12/17/2019   . Current antihyperglycemic regimen:  o Metformin XR 500mg  2 tablets twice daily o Humalog 50/50 Mix 25 units with breakfast, 22 units with supper . Denies hypoglycemic symptoms, including dizziness, lightheadedness, shaking, sweating . Denies hyperglycemic symptoms, including polyuria, polydipsia, polyphagia, nocturia, blurred vision, neuropathy . Current exercise: 30 minutes daily, 3 days a week . Current blood glucose readings:  o Recent FBG Readings: 147, 145, 174, 163, 135, 131, 205, 129, 116, 116, 113, 116 o Recent evening pre-meal BG readings (6/7pm): 121, 109, 93, 135, 160, 93, 140, 119, 112, 193, 110, 161 . Cardiovascular risk reduction: o Current hypertensive regimen:  - Carvedilol 12.5mg  twice daily with a meal - Doxazosin 4mg  1/2 tablet daily (prostate) - Losartan/HCTZ 100-25mg  daily - Amlodipine 10mg  1/2 tablet twice daily o Current hyperlipidemia regimen: N/A o Current antiplatelet regimen: N/A  Pharmacist Clinical Goal(s):  Marland Kitchen Over the next 90 days, patient will work with PharmD, endocrinologist, and primary care provider to address elevated blood sugars and A1c above goal.  Interventions: . Comprehensive medication review performed, medication list updated in electronic medical record . Patient's blood sugars have improved since our last appointment, but morning blood sugars are still elevated . Encouraged patient to record any low records that occur at night . Encouraged healthy diet and 150 minutes of exercise per  week  Patient Self Care Activities:  . Patient will check blood glucose twice daily over the next 90 days document, and provide at future appointments . Patient will focus on medication adherence by continued use of weekly pill box over the next 90 days . Patient will increase exercise to 30 minutes 5 times weekly over the next 90 days . Patient will limit soda intake over the next 90 days . Patient will take medications as prescribed . Patient will contact provider with any episodes of hypoglycemia . Patient will report any questions or concerns to provider   Please see past updates related to this goal by clicking on the "Past Updates" button in the selected goal      . High Cholesterol Management       CARE PLAN ENTRY  Current Barriers:  . Controlled hyperlipidemia, complicated by diabetes . Current antihyperlipidemic regimen: N/A . Previous antihyperlipidemic medications: Patient has tried statins in the past . Most recent lipid panel:  Lipid Panel     Component Value Date/Time   CHOL 170 12/19/2019 0135   CHOL 158 03/23/2019 1339   TRIG 63 12/19/2019 0135   HDL 62 12/19/2019 0135   HDL 76 03/23/2019 1339   CHOLHDL 2.7 12/19/2019 0135   VLDL 13 12/19/2019 0135   LDLCALC 95 12/19/2019 0135   LDLCALC 68 03/23/2019 1339   LABVLDL 14 03/23/2019 1339    . ASCVD risk enhancing conditions: age >43, DM, HTN, CKD  Pharmacist Clinical Goal(s):  Marland Kitchen Over the next 90 days patient will work with PharmD and providers towards optimized antihyperlipidemic therapy  Interventions: . Comprehensive medication review performed;  medication list updated in electronic medical record.  . Discussed diet and exercise as a way to manage cholesterol  Patient Self Care Activities:  . Patient will focus on heart healthy diet and 150 minutes of exercise per week over the next 90 days  Please see past updates related to this goal by clicking on the "Past Updates" button in the selected goal      .  Hypertension: Goal BP <140/90       CARE PLAN ENTRY  Current Barriers:  . Uncontrolled hypertension, complicated by diabetes and chronic kidney disease . Current antihypertensive regimen:  o Carvedilol 12.5mg  twice daily with a meal o Doxazosin 4mg  1/2 tablet daily (prostate) o Losartan/HCTZ 100-25mg  daily o Amlodipine 10mg  1/2 tablet twice daily . Previous antihypertensives tried: Diltiazem, hydralazine, metoprolol . Last practice recorded BP readings:  BP Readings from Last 3 Encounters:  12/26/19 (!) 146/78  12/20/19 138/88  11/01/19 (!) 204/91   . Current home BP readings:  130/70, 135/75, 130/60, 110/60, 135/65  Pharmacist Clinical Goal(s):  Marland Kitchen Over the next 90 days, patient will work with PharmD and providers to optimize antihypertensive regimen  Interventions: . Comprehensive medication review performed; medication list updated in the electronic medical record.  . Counseled patient on importance of diet and exercise modifications . Encouraged patient on blood pressure improvement since last visit  Patient Self Care Activities:  . Patient will continue to check BP 3 times weekly and if symptomatic over the next 90 days, document, and provide at future appointments . Patient will focus on medication adherence by continued use of pillbox over the next 90 days  Please see past updates related to this goal by clicking on the "Past Updates" button in the selected goal         The patient verbalized understanding of instructions provided today and agreed to receive a mailed copy of patient instruction and/or educational materials.  Telephone follow up appointment with pharmacy team member scheduled for: 06/07/20 @ 8:30 AM  Jannette Fogo, PharmD Clinical Pharmacist [facility] [phone]

## 2020-03-07 NOTE — Chronic Care Management (AMB) (Signed)
Chronic Care Management Pharmacy  Name: NAVEED HUMPHRES  MRN: [id number] DOB: Jan 13, 1931  Chief Complaint/ HPI  Jemari B Mcginn,  86 y.o. , male presents for his Follow-Up CCM visit with the clinical pharmacist via telephone due to COVID-19 Pandemic.  PCP : Glendale Chard, MD  His chronic conditions include: Diabetes, hypertension, pure hypercholesterolemia  Office Visits: 12/26/19 OV: Presented for hospital follow up after hypertensive emergency. Pt reported compliance with medications since hospital discharge. Encouraged to return in 1-2 weeks for nurse visit and to bring in home BP meter to ensure accuracy. Will consider adding Nexletol for hypercholesterolemia in the near future (pt intolerant of statins). Reduced amlodipine to 10mg  1/2 tablet daily.  2/24-3/2: Multiple telephone calls regarding doxazosin- Patient did not remember who prescribed this medication initially, but that it was a prostate specialist. Dr. Baird Cancer recommended patient cut dose in half (to 2 mg daily) because it might be contributing to dizziness.   07/06/19 OV: HTN evaluation, encourage medication compliance, carvedilol recently increased by cardiologist, BP 162/64mmHg in office.  06/22/19 OV: BP 146/61mmHg, follow up after cardiology visit, encouraged to take new carvedilol dose that cardio prescribed.  Consult Visits: 03/04/20 Podiatry OV w/ Dr. Elisha Ponder: Follow up for preventative foot wear. Toenail debridement performed. Pt requested to be seen sooner than 3 months, instructed to call for 9 week appt.   02/28/20 Ophthalmology OV w/ Dr. Coralyn Pear: Alfonse Flavors injections administered. Follow up in 4-5 weeks.   01/26/20 Ophthalmology OV w/ Dr. Coralyn Pear: Alfonse Flavors injections administered. Follow up in 4 weeks.   01/15/20 Ophthalmology OV w/ Dr. Coralyn Pear: Prolensa samples given in office. Eye injections administered. Follow up in 4 weeks.   5/2-12/20/19 ED admission: Presented with transient aphasia (resolved prior to arriving at  ED) and markedly elevated BP concerning for hypertensive crisis. MRI brain negative for stroke. BP control improved after resuming home meds. Start amlodipine 10mg  daily.   12/04/19 Podiatry OV w/ Dr. Elisha Ponder: Presented for toenail debridement (pain due to onychomycosis of toenail), patient education on diabetic foot care given, follow up in 3 months for debridement.  11/30/19-Ophthalmology OV w/ Dr. Coralyn Pear- Presented for non-proliferative diabetic retinopathy w/ macular edema. Patient stopped prednisolone and ketorolac eye drops. Restart prednisolone eye drops and add Prolensa eye drops. Samples given in office. Eylea intravitreal injections administered. Follow up in 4 weeks.  11/01/19 Ophthalmology OV w/ Dr. Coralyn Pear: Presented for non-proliferative diabetic retinopathy w/ macular edema.Using ketorolac and prednisolone eye drops. Eylea intravitreal injections administered. Follow up in 4 weeks.  10/04/19- Ophthalmology OV w/ Dr. Coralyn Pear- Presented for non-proliferative diabetic retinopathy w/ macular edema.Using ketorolac and prednisolone eye drops. Eylea intravitreal injections administered. Follow up in 4 weeks.  09/25/19 ED visit: Syncope and collapse; no acute abnormalities on MRI. Was given dose of Bidil in cardiologist office and 15 minutes later had syncopal event.   09/25/19 Cardiology OV w/ Dr. Virgina Jock: Patient's blood pressure was 206/177mmHg in office so patient was given 1 dose of Bidil 20-37.5mg . 15 minutes later patient was confused, pulse became thready and BP dropped to 68/78mmHg. Patient stopped breathing for a few seconds but then regained consciousness. BP increased to 200/100 again and then decreased to 140/100 5 minutes later. Sent to ED for further evaluation  08/28/19 Cardiology OV: BP 189/97 in office; patient instructed to check BP at home and bring log to next visit in 4 weeks  08/14/19 Cardiology OV: BP 204/98 and 184/81, restarted carvedilol (stopped previously because of  dizziness)  08/09/19- Shingles ED visit  07/31/19- Endocrinology OV w/ Dr. Buddy Duty  07/28/19 Ophthalmology OV w/ Dr. Coralyn Pear Presented for non-proliferative diabetic retinopathy w/ macular edema.Using ketorolac and prednisolone eye drops. Eylea intravitreal injections administered. Patient education on chronic nature of condition and need for close monitoring of blood pressure, blood sugar, and lipids. Follow up in 4 weeks.  06/19/19 Cardiology OV: Carvedilol increased to 12.5mg  twice daily due to uncontrolled blood pressure. Recommended close follow up with PCP.   Medications: Outpatient Encounter Medications as of 03/07/2020  Medication Sig  . acetaminophen (TYLENOL) 325 MG tablet Take 650 mg by mouth every 6 (six) hours as needed for mild pain or fever.  Marland Kitchen amLODipine (NORVASC) 5 MG tablet Take 1 tablet by mouth 2 times per day  . Bromfenac Sodium (PROLENSA) 0.07 % SOLN Place 1 drop into both eyes 4 (four) times daily.  . carvedilol (COREG) 12.5 MG tablet Take 1 tablet (12.5 mg total) by mouth 2 (two) times daily with a meal.  . Cholecalciferol (VITAMIN D3) 25 MCG (1000 UT) CAPS Take 1,000 Units by mouth daily.   Marland Kitchen doxazosin (CARDURA) 4 MG tablet Take 0.5 tablets (2 mg total) by mouth daily with supper.  . Empagliflozin (JARDIANCE PO) Take 25 mg by mouth once. 1 Tablet Daily with Breakfast  . fexofenadine (ALLEGRA) 180 MG tablet Take 180 mg by mouth daily as needed (allergies.).   Marland Kitchen insulin lispro protamine-lispro (HUMALOG 50/50 MIX) (50-50) 100 UNIT/ML SUSP injection Inject 22-25 Units into the skin See admin instructions. Inject 25 units subcutaneously with breakfast and 22 units with supper  . Insulin Syringe-Needle U-100 (EASY TOUCH INSULIN SYRINGE) 30G X 5/16" 0.5 ML MISC USE 3 TIMES DAILY  . losartan-hydrochlorothiazide (HYZAAR) 100-25 MG tablet TAKE 1 TABLET BY MOUTH  DAILY  . metFORMIN (GLUCOPHAGE-XR) 500 MG 24 hr tablet Take 1,000 mg by mouth 2 (two) times daily with a meal.   .  prednisoLONE acetate (PRED FORTE) 1 % ophthalmic suspension Place 1 drop into both eyes 4 (four) times daily.   No facility-administered encounter medications on file as of 03/07/2020.   Current Diagnosis/Assessment:  SDOH Interventions     Most Recent Value  SDOH Interventions  Financial Strain Interventions Intervention Not Indicated      Goals Addressed            This Visit's Progress   . Diabetes: Goal A1c <7%       CARE PLAN ENTRY  Current Barriers:  . Diabetes: Uncontrolled type 2; complicated by chronic medical conditions including kidney disease and hypertension Lab Results  Component Value Date   HGBA1C 8.0 (H) 12/18/2019 .   Lab Results  Component Value Date   CREATININE 1.08 12/20/2019   CREATININE 1.17 12/19/2019   CREATININE 1.65 (H) 12/17/2019   . Current antihyperglycemic regimen:  o Metformin XR 500mg  2 tablets twice daily o Humalog 50/50 Mix 25 units with breakfast, 22 units with supper . Denies hypoglycemic symptoms, including dizziness, lightheadedness, shaking, sweating . Denies hyperglycemic symptoms, including polyuria, polydipsia, polyphagia, nocturia, blurred vision, neuropathy . Current exercise: 30 minutes daily, 3 days a week . Current blood glucose readings:  o Recent FBG Readings: 147, 145, 174, 163, 135, 131, 205, 129, 116, 116, 113, 116 o Recent evening pre-meal BG readings (6/7pm): 121, 109, 93, 135, 160, 93, 140, 119, 112, 193, 110, 161 . Cardiovascular risk reduction: o Current hypertensive regimen:  - Carvedilol 12.5mg  twice daily with a meal - Doxazosin 4mg  1/2 tablet daily (prostate) - Losartan/HCTZ 100-25mg  daily - Amlodipine  10mg  1/2 tablet twice daily o Current hyperlipidemia regimen: N/A o Current antiplatelet regimen: N/A  Pharmacist Clinical Goal(s):  Marland Kitchen Over the next 90 days, patient will work with PharmD, endocrinologist, and primary care provider to address elevated blood sugars and A1c above  goal.  Interventions: . Comprehensive medication review performed, medication list updated in electronic medical record . Patient's blood sugars have improved since our last appointment, but morning blood sugars are still elevated . Encouraged patient to record any low records that occur at night . Encouraged healthy diet and 150 minutes of exercise per week  Patient Self Care Activities:  . Patient will check blood glucose twice daily over the next 90 days document, and provide at future appointments . Patient will focus on medication adherence by continued use of weekly pill box over the next 90 days . Patient will increase exercise to 30 minutes 5 times weekly over the next 90 days . Patient will limit soda intake over the next 90 days . Patient will take medications as prescribed . Patient will contact provider with any episodes of hypoglycemia . Patient will report any questions or concerns to provider   Please see past updates related to this goal by clicking on the "Past Updates" button in the selected goal      . High Cholesterol Management       CARE PLAN ENTRY  Current Barriers:  . Controlled hyperlipidemia, complicated by diabetes . Current antihyperlipidemic regimen: N/A . Previous antihyperlipidemic medications: Patient has tried statins in the past . Most recent lipid panel:  Lipid Panel     Component Value Date/Time   CHOL 170 12/19/2019 0135   CHOL 158 03/23/2019 1339   TRIG 63 12/19/2019 0135   HDL 62 12/19/2019 0135   HDL 76 03/23/2019 1339   CHOLHDL 2.7 12/19/2019 0135   VLDL 13 12/19/2019 0135   LDLCALC 95 12/19/2019 0135   LDLCALC 68 03/23/2019 1339   LABVLDL 14 03/23/2019 1339    . ASCVD risk enhancing conditions: age >22, DM, HTN, CKD  Pharmacist Clinical Goal(s):  Marland Kitchen Over the next 90 days patient will work with PharmD and providers towards optimized antihyperlipidemic therapy  Interventions: . Comprehensive medication review performed; medication  list updated in electronic medical record.  . Discussed diet and exercise as a way to manage cholesterol  Patient Self Care Activities:  . Patient will focus on heart healthy diet and 150 minutes of exercise per week over the next 90 days  Please see past updates related to this goal by clicking on the "Past Updates" button in the selected goal      . Hypertension: Goal BP <140/90       CARE PLAN ENTRY  Current Barriers:  . Uncontrolled hypertension, complicated by diabetes and chronic kidney disease . Current antihypertensive regimen:  o Carvedilol 12.5mg  twice daily with a meal o Doxazosin 4mg  1/2 tablet daily (prostate) o Losartan/HCTZ 100-25mg  daily o Amlodipine 10mg  1/2 tablet twice daily . Previous antihypertensives tried: Diltiazem, hydralazine, metoprolol . Last practice recorded BP readings:  BP Readings from Last 3 Encounters:  12/26/19 (!) 146/78  12/20/19 138/88  11/01/19 (!) 204/91   . Current home BP readings:  130/70, 135/75, 130/60, 110/60, 135/65  Pharmacist Clinical Goal(s):  Marland Kitchen Over the next 90 days, patient will work with PharmD and providers to optimize antihypertensive regimen  Interventions: . Comprehensive medication review performed; medication list updated in the electronic medical record.  . Counseled patient on importance of diet and  exercise modifications . Encouraged patient on blood pressure improvement since last visit  Patient Self Care Activities:  . Patient will continue to check BP 3 times weekly and if symptomatic over the next 90 days, document, and provide at future appointments . Patient will focus on medication adherence by continued use of pillbox over the next 90 days  Please see past updates related to this goal by clicking on the "Past Updates" button in the selected goal         Diabetes   Recent Relevant Labs: Lab Results  Component Value Date/Time   HGBA1C 8.0 (H) 12/18/2019 10:34 AM   HGBA1C 7.9 (H) 04/30/2019 02:53 AM    MICROALBUR 150 03/23/2019 04:38 PM     Kidney Function Lab Results  Component Value Date/Time   CREATININE 1.08 12/20/2019 07:50 AM   CREATININE 1.17 12/19/2019 08:45 AM   GFRNONAA >60 12/20/2019 07:50 AM   GFRAA >60 12/20/2019 07:50 AM  Stage 2 CKD  Checking BG: 2x per Day  Recent FBG Readings: 147, 145, 174, 163, 135, 131, 205, 129, 116, 116, 113, 116 Recent evening pre-meal BG readings (6/7pm): 121, 109, 93, 135, 160, 93, 140, 119, 112, 193, 110, 161  Patient has failed these meds in past: Januvia Patient is currently uncontrolled on the following medications:  -Metformin XR 500mg  2 tablets twice daily -Humalog 50/50 Mix 25 units with breakfast, 22 units with supper  Last diabetic Foot exam: 08/28/19 Last diabetic Eye exam: 11/30/19  We discussed:  . Diet extensively  o No snacking . Exercise extensively o 3 days a weeks for 30 minutes (every other day) o Feels a little better, more alert o Working towards 5 days a week o Encouraged pt to keep up the good work! . Seeing Dr. Buddy Duty (endocrinologist) at [place] (no access to this notes) o Pt says Dr. Buddy Duty feels that his numbers are on target . Pt mentioned having a few low readings (50-60s in the middle of the night) after a reading in the 90s before dinner o Der pt, Dr. Buddy Duty advised him to maybe skip insulin dose if he has low numbers . Discussed that the morning FBG readings were higher than we would like (Goal <130)  Plan -Continue current medications   Diabetic Retinopathy   Patient has failed these meds in past: Ketorolac Patient is currently controlled on the following medications:  -Prednisolone 1% eye drops 4 times daily -Prolensa eye drops 4 times daily -Eylea intravitreal injections   We discussed:   -Pt receiving Prolensa assistance  Plan -Continue current medications  Hypertension   Office blood pressures are  BP Readings from Last 3 Encounters:  03/05/20 (!) 146/74  12/26/19 (!) 146/78   12/20/19 138/88   Prior diagnosis of paroxysmal atrial tachycardia, s/p ablation 05/08/19  Patient has failed these meds in the past: Diltiazem, hydralazine, metoprolol tartrate  Patient checks BP at home 1-2x per week  Patient home BP readings are ranging: 130/70, 135/75, 130/60, 110/60, 135/65  Patient reports no current dizziness  Patient states he does not feel that he has white coat syndrome   Patient is currently controlled on the following medications:  -Carvedilol 12.5mg  twice daily with a meal -Doxazosin 4mg  1/2 tablet daily (prostate) -Losartan/HCTZ 100-25mg  daily -Amlodipine 10mg  1/2 tablet twice daily  We discussed:  Pt saw new cardiologist yesterday and stated that no changes were made to medications  BP is improved since last visit  Encouraged pt to continue working out and eating healthy  Plan -Continue current medications   Hyperlipidemia   Lipid Panel     Component Value Date/Time   CHOL 170 12/19/2019 0135   CHOL 158 03/23/2019 1339   TRIG 63 12/19/2019 0135   HDL 62 12/19/2019 0135   HDL 76 03/23/2019 1339   CHOLHDL 2.7 12/19/2019 0135   VLDL 13 12/19/2019 0135   LDLCALC 95 12/19/2019 0135   LDLCALC 68 03/23/2019 1339   LABVLDL 14 03/23/2019 1339     The ASCVD Risk score (Goff DC Jr., et al., 2013) failed to calculate for the following reasons:   The 2013 ASCVD risk score is only valid for ages 44 to 10   Patient has failed these meds in past: Statin allergy listed in chart  Patient is currently controlled on the following medications: N/A  We discussed:   -Diet and exercise extensively  Plan -Continue control with diet and exercise  Medication Management   Pt uses [facility]Mail order pharmacy for all medications Uses pill box? Yes Pt endorses 100% compliance  We discussed:  -The importance of medication compliance  Plan -Continue current medication management strategy  Follow up: 3 month phone visit  Jannette Fogo, PharmD   Clinical Pharmacist [facility] [phone]

## 2020-03-25 ENCOUNTER — Ambulatory Visit: Payer: Medicare Other | Admitting: Cardiology

## 2020-03-27 NOTE — Progress Notes (Addendum)
[facility] Note  03/29/2020     CHIEF COMPLAINT Patient presents for Retina Follow Up   HISTORY OF PRESENT ILLNESS: Marcus Johnston is a 86 y.o. male who presents to the clinic today for:   HPI    Retina Follow Up    Patient presents with  Diabetic Retinopathy.  In both eyes.  This started weeks ago.  Severity is moderate.  Duration of weeks.  Since onset it is stable.  I, the attending physician,  performed the HPI with the patient and updated documentation appropriately.          Comments    Pt states vision is about the same OU.  Patient denies eye pain or discomfort and denies any new or worsening floaters or fol OU.       Last edited by [name], MD on 03/29/2020 11:12 AM. (History)    Patient states he is doing "okay", nothing has changed with his vision, no new health concerns   Referring physician: [name], MD [address] [place],  [place] [address]  HISTORICAL INFORMATION:   Selected notes from the MEDICAL RECORD NUMBER Referred by Dr. [name] for CME OU   CURRENT MEDICATIONS: Current Outpatient Medications (Ophthalmic Drugs)  Medication Sig  . Bromfenac Sodium (PROLENSA) 0.07 % SOLN Place 1 drop into both eyes 4 (four) times daily.  . prednisoLONE acetate (PRED FORTE) 1 % ophthalmic suspension Place 1 drop into both eyes 4 (four) times daily.   No current facility-administered medications for this visit. (Ophthalmic Drugs)   Current Outpatient Medications (Other)  Medication Sig  . acetaminophen (TYLENOL) 325 MG tablet Take 650 mg by mouth every 6 (six) hours as needed for mild pain or fever.  Marland Kitchen amLODipine (NORVASC) 5 MG tablet Take 1 tablet by mouth 2 times per day  . carvedilol (COREG) 12.5 MG tablet Take 1 tablet (12.5 mg total) by mouth 2 (two) times daily with a meal.  . Cholecalciferol (VITAMIN D3) 25 MCG (1000 UT) CAPS Take 1,000 Units by mouth daily.   Marland Kitchen doxazosin (CARDURA) 4 MG tablet Take  0.5 tablets (2 mg total) by mouth daily with supper.  . Empagliflozin (JARDIANCE PO) Take 25 mg by mouth once. 1 Tablet Daily with Breakfast  . fexofenadine (ALLEGRA) 180 MG tablet Take 180 mg by mouth daily as needed (allergies.).   Marland Kitchen insulin lispro protamine-lispro (HUMALOG 50/50 MIX) (50-50) 100 UNIT/ML SUSP injection Inject 22-25 Units into the skin See admin instructions. Inject 25 units subcutaneously with breakfast and 22 units with supper  . Insulin Syringe-Needle U-100 (EASY TOUCH INSULIN SYRINGE) 30G X 5/16" 0.5 ML MISC USE 3 TIMES DAILY  . losartan-hydrochlorothiazide (HYZAAR) 100-25 MG tablet TAKE 1 TABLET BY MOUTH  DAILY  . metFORMIN (GLUCOPHAGE-XR) 500 MG 24 hr tablet Take 1,000 mg by mouth 2 (two) times daily with a meal.    No current facility-administered medications for this visit. (Other)      REVIEW OF SYSTEMS: ROS    Positive for: Neurological, Genitourinary, Endocrine, Cardiovascular, Eyes, Respiratory   Negative for: Constitutional, Gastrointestinal, Skin, Musculoskeletal, HENT, Psychiatric, Allergic/Imm, Heme/Lymph   Last edited by Doneen Poisson on 03/29/2020  9:17 AM. (History)       ALLERGIES Allergies  Allergen Reactions  . Bidil [Isosorb Dinitrate-Hydralazine] Other (See Comments)    Severe hypotension episode after administering one dose of Bidil 20-37.5 mg on 09/25/2019.   . Statins Other (See Comments)  Leg pain    PAST MEDICAL HISTORY Past Medical History:  Diagnosis Date  . Atrial fibrillation ([place])   . Atypical atrial flutter ([place]) 10/17/2018  . Diabetes mellitus without complication ([place])   . Diabetic retinopathy ([place])    NPDR OU  . High cholesterol   . History of prostate cancer   . HTN (hypertension)   . Hypertensive retinopathy    OU  . Renal insufficiency   . Shingles    Past Surgical History:  Procedure Laterality Date  . ATRIAL TACH ABLATION N/A 05/08/2019   Procedure: ATRIAL TACH ABLATION;  Surgeon: [name], MD;   Location: [facility];  Service: Cardiovascular;  Laterality: N/A;  . CATARACT EXTRACTION Bilateral   . EYE SURGERY Bilateral    Cat Sx  . prostate implant      FAMILY HISTORY Family History  Problem Relation Age of Onset  . Cancer Mother   . Cancer Father     SOCIAL HISTORY Social History   Tobacco Use  . Smoking status: Never Smoker  . Smokeless tobacco: Never Used  Vaping Use  . Vaping Use: Never used  Substance Use Topics  . Alcohol use: No  . Drug use: No         OPHTHALMIC EXAM:  Base Eye Exam    Visual Acuity (Snellen - Linear)      Right Left   Dist [place] 20/150 -1 20/150 -2   Dist ph [place] 20/30 -2 20/80 -2  Forgot glasses       Tonometry (Tonopen, 9:22 AM)      Right Left   Pressure 15 18       Pupils      Dark Light Shape React APD   Right 3 2 Round Brisk 0   Left 3 2 Round Brisk 0       Visual Fields      Left Right    Full Full       Extraocular Movement      Right Left    Full Full       Neuro/Psych    Oriented x3: Yes   Mood/Affect: Normal       Dilation    Both eyes: 1.0% Mydriacyl, 2.5% Phenylephrine @ 9:22 AM        Slit Lamp and Fundus Exam    Slit Lamp Exam      Right Left   Lids/Lashes Dermatochalasis - upper lid, mild Meibomian gland dysfunction Dermatochalasis - upper lid, mild Meibomian gland dysfunction   Conjunctiva/Sclera Mild Melanosis Mild Melanosis   Cornea Trace Punctate epithelial erosions 1+ Punctate epithelial erosions. endopigment   Anterior Chamber Deep and quiet Deep and quiet   Iris Round and dilated, No NVI Round and dilated, No NVI   Lens Toric PC IOL with marks at 1200 and 0600 PC IOL in good position with PC folds   Vitreous Vitreous syneresis, PVD and vitreous condensations, no residual IVTA Vitreous syneresis, no residual IVTA       Fundus Exam      Right Left   Disc Mild Pallor, mild, temporal Peripapillary atrophy, Sharp rim temporal Peripapillary atrophy, +cupping, mild Pallor, Sharp  rim   C/D Ratio 0.6 0.65   Macula Blunted foveal reflex, central IRH improved; pigment clumping, Microaneurysms, persistent central cystic changes Blunted foveal reflex, Retinal pigment epithelial mottling and clumping, focal Exudates - improved, +IRH improved, +central Cystic changes -- persistent, scattered Microaneurysms   Vessels Vascular attenuation, Tortuous Vascular  attenuation, mild Copper wiring, AV crossing changes   Periphery Attached, large HST at 0500 with surrounding SRF, +pigmented barrier laser scar from 0430-0600    Attached, small pigmented operculum at 0730 midzone--stable, no SRF             IMAGING AND PROCEDURES  Imaging and Procedures for @TODAY @  OCT, Retina - OU - Both Eyes       Right Eye Quality was good. Central Foveal Thickness: 322. Progression has been stable. Findings include abnormal foveal contour, intraretinal fluid, no SRF, intraretinal hyper-reflective material (Persistent IRF/IRHM; Trace ERM).   Left Eye Quality was good. Central Foveal Thickness: 260. Progression has been stable. Findings include intraretinal fluid, abnormal foveal contour, intraretinal hyper-reflective material, no SRF, outer retinal atrophy (Mild ERM; persistent IRF/IRHM).   Notes *Images captured and stored on drive  Diagnosis / Impression:  OD: persistent RF/IRHM; Trace ERM OS: Mild ERM; persistent IRF/IRHM  Clinical management:  See below  Abbreviations: NFP - Normal foveal profile. CME - cystoid macular edema. PED - pigment epithelial detachment. IRF - intraretinal fluid. SRF - subretinal fluid. EZ - ellipsoid zone. ERM - epiretinal membrane. ORA - outer retinal atrophy. ORT - outer retinal tubulation. SRHM - subretinal hyper-reflective material        Intravitreal Injection, Pharmacologic Agent - OD - Right Eye       Time Out 03/29/2020. 9:51 AM. Confirmed correct patient, procedure, site, and patient consented.   Anesthesia Topical anesthesia was used.  Anesthetic medications included Lidocaine 2%, Proparacaine 0.5%.   Procedure Preparation included 5% betadine to ocular surface, eyelid speculum. A (32g) needle was used.   Injection:  2 mg aflibercept Alfonse Flavors) SOLN   NDC: A3590391, Lot: [id number], Expiration date: 06/16/2020   Route: Intravitreal, Site: Right Eye, Waste: 0.05 mL  Post-op Post injection exam found visual acuity of at least counting fingers. The patient tolerated the procedure well. There were no complications. The patient received written and verbal post procedure care education. Post injection medications were not given.        Intravitreal Injection, Pharmacologic Agent - OS - Left Eye       Time Out 03/29/2020. 9:51 AM. Confirmed correct patient, procedure, site, and patient consented.   Anesthesia Topical anesthesia was used. Anesthetic medications included Lidocaine 2%, Proparacaine 0.5%.   Procedure Preparation included 5% betadine to ocular surface, eyelid speculum. A (32g) needle was used.   Injection:  2 mg aflibercept Alfonse Flavors) SOLN   NDC: M7179715, Lot: [id number], Expiration date: 01/14/2021   Route: Intravitreal, Site: Left Eye, Waste: 0.05 mL  Post-op Post injection exam found visual acuity of at least counting fingers. The patient tolerated the procedure well. There were no complications. The patient received written and verbal post procedure care education. Post injection medications were not given.                 ASSESSMENT/PLAN:    ICD-10-CM   1. Moderate nonproliferative diabetic retinopathy of both eyes with macular edema associated with type 2 diabetes mellitus (HCC)  Z56.3875 Intravitreal Injection, Pharmacologic Agent - OD - Right Eye    Intravitreal Injection, Pharmacologic Agent - OS - Left Eye    aflibercept (EYLEA) SOLN 2 mg    aflibercept (EYLEA) SOLN 2 mg  2. Retinal edema  H35.81 OCT, Retina - OU - Both Eyes  3. History of retinal detachment  Z86.69   4.  Essential hypertension  I10   5. Hypertensive retinopathy of both eyes  H35.033  6. Pseudophakia of both eyes  Z96.1     1,2.  Non-proliferative diabetic retinopathy w/ macular edema OU  - patient with long standing history of macular edema -- was seen by Dr. Zigmund Daniel back in 2013  - has also seen Dr. Baird Cancer, Dr. Posey Pronto, [place] service at [facility]  - last retina f/u in 2019 with Dr. Posey Pronto and at [facility]  - pt has previously received intravitreal injections OU, but does not like them and felt like benefits from injections were minmal  - at initial visit, was using PF and ketorolac QID OU -- pt self d/c  - exam shows scattered/minimal MA but central edema OU  - FA (12.11.20) shows late leaking MA, OS shows capillary nonperfusion, OD with ?petaloid central hyperfluorescence -- ?CME component  - s/p IVA# 1 OD (12.11.20)  - s/p IVE #1 OD (01.20.21), #2 (02.17.21), #3 (03.17.21), #4 (05.13.21), #5 (06.11.21), #6 (07.14.21)  - s/p IVE# 1 OS (12.11.20), #2 (01.20.21), #3 (02.17.21), #4 (03.17.21), #5 (04.15.21), #6 (4.15.21), #7 (06.11.21), #8 (07.14.21)             - s/p IVTA OD #1 (04.15.21)  - s/p IVTA OS #1 (05.13.21)  - BCVA OD: 20/30; OS: 20/80  - OCT shows mild persistent IRF/IRHM OU  - Eylea4U benefits investigation initiated 12.11.20 -- approved for 2021             - Recommend IVE OU (OD #7, OS #9) today, 08.13.21  - pt wishes to proceed  - RBA of procedure discussed, questions answered  - informed consent obtained  - see procedure notes  - Eylea informed consent form signed and scanned on 01.20.21  - IVTA informed consent form signed and scanned on 04.15.20  - cont PF QID OU and Prolensa QID OU  - f/u in 4 wks -- DFE/OCT/possible injection **will go back to IVTA if IVE fails to improve IRF/DME**  3. History of inferior HST w/ focal RD OD s/p laser retinopexy  - HST at 0500 w/ surrounding SRF  - good barricade laser in place spanning 0430-0600  - stable  4,5. Hypertensive  retinopathy OU  - discussed importance of tight BP control  - monitor   6. Pseudophakia OU  - s/p CE/IOL OU  - beautiful surgeries, doing well  - monitor   Ophthalmic Meds Ordered this visit:  Meds ordered this encounter  Medications  . aflibercept (EYLEA) SOLN 2 mg  . aflibercept (EYLEA) SOLN 2 mg       Return in about 4 weeks (around 04/26/2020) for f/u NPDR OU, DFE, OCT.  There are no Patient Instructions on file for this visit.   Explained the diagnoses, plan, and follow up with the patient and they expressed understanding.  Patient expressed understanding of the importance of proper follow up care.   This document serves as a record of services personally performed by Gardiner Sleeper, MD, PhD. It was created on their behalf by San Jetty. Owens Shark, OA an ophthalmic technician. The creation of this record is the provider's dictation and/or activities during the visit.    Electronically signed by: San Jetty. Owens Shark, [place] 08.11.2021 11:25 AM  Gardiner Sleeper, M.D., Ph.D. Diseases & Surgery of the Retina and Vitreous [facility]  I have reviewed the above documentation for accuracy and completeness, and I agree with the above. Gardiner Sleeper, M.D., Ph.D. 03/29/20 11:25 AM   Abbreviations: M myopia (nearsighted); A astigmatism; H hyperopia (farsighted); P  presbyopia; Mrx spectacle prescription;  CTL contact lenses; OD right eye; OS left eye; OU both eyes  XT exotropia; ET esotropia; PEK punctate epithelial keratitis; PEE punctate epithelial erosions; DES dry eye syndrome; MGD meibomian gland dysfunction; ATs artificial tears; PFAT's preservative free artificial tears; [place] nuclear sclerotic cataract; PSC posterior subcapsular cataract; ERM epi-retinal membrane; PVD posterior vitreous detachment; RD retinal detachment; DM diabetes mellitus; DR diabetic retinopathy; NPDR non-proliferative diabetic retinopathy; PDR proliferative diabetic retinopathy; CSME clinically  significant macular edema; DME diabetic macular edema; dbh dot blot hemorrhages; CWS cotton wool spot; POAG primary open angle glaucoma; C/D cup-to-disc ratio; HVF humphrey visual field; GVF goldmann visual field; OCT optical coherence tomography; IOP intraocular pressure; BRVO Branch retinal vein occlusion; CRVO central retinal vein occlusion; CRAO central retinal artery occlusion; BRAO branch retinal artery occlusion; RT retinal tear; SB scleral buckle; PPV pars plana vitrectomy; VH Vitreous hemorrhage; PRP panretinal laser photocoagulation; IVK intravitreal kenalog; VMT vitreomacular traction; MH Macular hole;  NVD neovascularization of the disc; NVE neovascularization elsewhere; AREDS age related eye disease study; ARMD age related macular degeneration; POAG primary open angle glaucoma; EBMD epithelial/anterior basement membrane dystrophy; ACIOL anterior chamber intraocular lens; IOL intraocular lens; PCIOL posterior chamber intraocular lens; Phaco/IOL phacoemulsification with intraocular lens placement; [place] photorefractive keratectomy; LASIK laser assisted in situ keratomileusis; HTN hypertension; DM diabetes mellitus; COPD chronic obstructive pulmonary disease

## 2020-03-29 ENCOUNTER — Encounter (INDEPENDENT_AMBULATORY_CARE_PROVIDER_SITE_OTHER): Payer: Self-pay | Admitting: Ophthalmology

## 2020-03-29 ENCOUNTER — Ambulatory Visit (INDEPENDENT_AMBULATORY_CARE_PROVIDER_SITE_OTHER): Payer: Medicare Other | Admitting: Ophthalmology

## 2020-03-29 ENCOUNTER — Other Ambulatory Visit: Payer: Self-pay

## 2020-03-29 DIAGNOSIS — E113313 Type 2 diabetes mellitus with moderate nonproliferative diabetic retinopathy with macular edema, bilateral: Secondary | ICD-10-CM

## 2020-03-29 DIAGNOSIS — H35033 Hypertensive retinopathy, bilateral: Secondary | ICD-10-CM

## 2020-03-29 DIAGNOSIS — H3581 Retinal edema: Secondary | ICD-10-CM | POA: Diagnosis not present

## 2020-03-29 DIAGNOSIS — I10 Essential (primary) hypertension: Secondary | ICD-10-CM

## 2020-03-29 DIAGNOSIS — Z961 Presence of intraocular lens: Secondary | ICD-10-CM

## 2020-03-29 DIAGNOSIS — Z8669 Personal history of other diseases of the nervous system and sense organs: Secondary | ICD-10-CM

## 2020-03-29 MED ORDER — AFLIBERCEPT 2MG/0.05ML IZ SOLN FOR KALEIDOSCOPE
2.0000 mg | INTRAVITREAL | Status: AC | PRN
  Administered 2020-03-29: 2 mg via INTRAVITREAL

## 2020-04-04 ENCOUNTER — Other Ambulatory Visit: Payer: Self-pay

## 2020-04-04 ENCOUNTER — Encounter: Payer: Self-pay | Admitting: Internal Medicine

## 2020-04-04 ENCOUNTER — Ambulatory Visit (INDEPENDENT_AMBULATORY_CARE_PROVIDER_SITE_OTHER): Payer: Medicare Other

## 2020-04-04 ENCOUNTER — Ambulatory Visit (INDEPENDENT_AMBULATORY_CARE_PROVIDER_SITE_OTHER): Payer: Medicare Other | Admitting: Internal Medicine

## 2020-04-04 VITALS — BP 152/80 | HR 73 | Temp 97.5°F | Ht 75.0 in | Wt 230.2 lb

## 2020-04-04 VITALS — BP 152/80 | HR 73 | Temp 97.5°F | Ht 75.0 in | Wt 230.0 lb

## 2020-04-04 DIAGNOSIS — Z0000 Encounter for general adult medical examination without abnormal findings: Secondary | ICD-10-CM

## 2020-04-04 DIAGNOSIS — R351 Nocturia: Secondary | ICD-10-CM

## 2020-04-04 DIAGNOSIS — E1159 Type 2 diabetes mellitus with other circulatory complications: Secondary | ICD-10-CM | POA: Diagnosis not present

## 2020-04-04 DIAGNOSIS — E559 Vitamin D deficiency, unspecified: Secondary | ICD-10-CM | POA: Diagnosis not present

## 2020-04-04 DIAGNOSIS — E663 Overweight: Secondary | ICD-10-CM

## 2020-04-04 DIAGNOSIS — I10 Essential (primary) hypertension: Secondary | ICD-10-CM

## 2020-04-04 DIAGNOSIS — I1310 Hypertensive heart and chronic kidney disease without heart failure, with stage 1 through stage 4 chronic kidney disease, or unspecified chronic kidney disease: Secondary | ICD-10-CM | POA: Diagnosis not present

## 2020-04-04 DIAGNOSIS — Z6828 Body mass index (BMI) 28.0-28.9, adult: Secondary | ICD-10-CM

## 2020-04-04 LAB — POCT UA - MICROALBUMIN
Creatinine, POC: 200 mg/dL
Microalbumin Ur, POC: 80 mg/L

## 2020-04-04 NOTE — Patient Instructions (Signed)
Marcus Johnston , Thank you for taking time to come for your Medicare Wellness Visit. I appreciate your ongoing commitment to your health goals. Please review the following plan we discussed and let me know if I can assist you in the future.   Screening recommendations/referrals: Colonoscopy: not required Recommended yearly ophthalmology/optometry visit for glaucoma screening and checkup Recommended yearly dental visit for hygiene and checkup  Vaccinations: Influenza vaccine: due Pneumococcal vaccine: decline Tdap vaccine: completed 07/21/2011 Shingles vaccine: discussed   Covid-19:  10/13/2019, 09/22/2019  Advanced directives: Advance directive discussed with you today. Even though you declined this today please call our office should you change your mind and we can give you the proper paperwork for you to fill out.  Conditions/risks identified: none  Next appointment: Follow up in one year for your annual wellness visit. 04/16/2021 at 8:30  Preventive Care 65 Years and Older, Male Preventive care refers to lifestyle choices and visits with your health care provider that can promote health and wellness. What does preventive care include?  A yearly physical exam. This is also called an annual well check.  Dental exams once or twice a year.  Routine eye exams. Ask your health care provider how often you should have your eyes checked.  Personal lifestyle choices, including:  Daily care of your teeth and gums.  Regular physical activity.  Eating a healthy diet.  Avoiding tobacco and drug use.  Limiting alcohol use.  Practicing safe sex.  Taking low doses of aspirin every day.  Taking vitamin and mineral supplements as recommended by your health care provider. What happens during an annual well check? The services and screenings done by your health care provider during your annual well check will depend on your age, overall health, lifestyle risk factors, and family history of  disease. Counseling  Your health care provider may ask you questions about your:  Alcohol use.  Tobacco use.  Drug use.  Emotional well-being.  Home and relationship well-being.  Sexual activity.  Eating habits.  History of falls.  Memory and ability to understand (cognition).  Work and work Statistician. Screening  You may have the following tests or measurements:  Height, weight, and BMI.  Blood pressure.  Lipid and cholesterol levels. These may be checked every 5 years, or more frequently if you are over 87 years old.  Skin check.  Lung cancer screening. You may have this screening every year starting at age 64 if you have a 30-pack-year history of smoking and currently smoke or have quit within the past 15 years.  Fecal occult blood test (FOBT) of the stool. You may have this test every year starting at age 44.  Flexible sigmoidoscopy or colonoscopy. You may have a sigmoidoscopy every 5 years or a colonoscopy every 10 years starting at age 55.  Prostate cancer screening. Recommendations will vary depending on your family history and other risks.  Hepatitis C blood test.  Hepatitis B blood test.  Sexually transmitted disease (STD) testing.  Diabetes screening. This is done by checking your blood sugar (glucose) after you have not eaten for a while (fasting). You may have this done every 1-3 years.  Abdominal aortic aneurysm (AAA) screening. You may need this if you are a current or former smoker.  Osteoporosis. You may be screened starting at age 31 if you are at high risk. Talk with your health care provider about your test results, treatment options, and if necessary, the need for more tests. Vaccines  Your health care  provider may recommend certain vaccines, such as:  Influenza vaccine. This is recommended every year.  Tetanus, diphtheria, and acellular pertussis (Tdap, Td) vaccine. You may need a Td booster every 10 years.  Zoster vaccine. You may  need this after age 60.  Pneumococcal 13-valent conjugate (PCV13) vaccine. One dose is recommended after age 1.  Pneumococcal polysaccharide (PPSV23) vaccine. One dose is recommended after age 37. Talk to your health care provider about which screenings and vaccines you need and how often you need them. This information is not intended to replace advice given to you by your health care provider. Make sure you discuss any questions you have with your health care provider. Document Released: 08/30/2015 Document Revised: 04/22/2016 Document Reviewed: 06/04/2015 Elsevier Interactive Patient Education  2017 [place] Prevention in the Home Falls can cause injuries. They can happen to people of all ages. There are many things you can do to make your home safe and to help prevent falls. What can I do on the outside of my home?  Regularly fix the edges of walkways and driveways and fix any cracks.  Remove anything that might make you trip as you walk through a door, such as a raised step or threshold.  Trim any bushes or trees on the path to your home.  Use bright outdoor lighting.  Clear any walking paths of anything that might make someone trip, such as rocks or tools.  Regularly check to see if handrails are loose or broken. Make sure that both sides of any steps have handrails.  Any raised decks and porches should have guardrails on the edges.  Have any leaves, snow, or ice cleared regularly.  Use sand or salt on walking paths during winter.  Clean up any spills in your garage right away. This includes oil or grease spills. What can I do in the bathroom?  Use night lights.  Install grab bars by the toilet and in the tub and shower. Do not use towel bars as grab bars.  Use non-skid mats or decals in the tub or shower.  If you need to sit down in the shower, use a plastic, non-slip stool.  Keep the floor dry. Clean up any water that spills on the floor as soon as it  happens.  Remove soap buildup in the tub or shower regularly.  Attach bath mats securely with double-sided non-slip rug tape.  Do not have throw rugs and other things on the floor that can make you trip. What can I do in the bedroom?  Use night lights.  Make sure that you have a light by your bed that is easy to reach.  Do not use any sheets or blankets that are too big for your bed. They should not hang down onto the floor.  Have a firm chair that has side arms. You can use this for support while you get dressed.  Do not have throw rugs and other things on the floor that can make you trip. What can I do in the kitchen?  Clean up any spills right away.  Avoid walking on wet floors.  Keep items that you use a lot in easy-to-reach places.  If you need to reach something above you, use a strong step stool that has a grab bar.  Keep electrical cords out of the way.  Do not use floor polish or wax that makes floors slippery. If you must use wax, use non-skid floor wax.  Do not have throw  rugs and other things on the floor that can make you trip. What can I do with my stairs?  Do not leave any items on the stairs.  Make sure that there are handrails on both sides of the stairs and use them. Fix handrails that are broken or loose. Make sure that handrails are as long as the stairways.  Check any carpeting to make sure that it is firmly attached to the stairs. Fix any carpet that is loose or worn.  Avoid having throw rugs at the top or bottom of the stairs. If you do have throw rugs, attach them to the floor with carpet tape.  Make sure that you have a light switch at the top of the stairs and the bottom of the stairs. If you do not have them, ask someone to add them for you. What else can I do to help prevent falls?  Wear shoes that:  Do not have high heels.  Have rubber bottoms.  Are comfortable and fit you well.  Are closed at the toe. Do not wear sandals.  If you  use a stepladder:  Make sure that it is fully opened. Do not climb a closed stepladder.  Make sure that both sides of the stepladder are locked into place.  Ask someone to hold it for you, if possible.  Clearly mark and make sure that you can see:  Any grab bars or handrails.  First and last steps.  Where the edge of each step is.  Use tools that help you move around (mobility aids) if they are needed. These include:  Canes.  Walkers.  Scooters.  Crutches.  Turn on the lights when you go into a dark area. Replace any light bulbs as soon as they burn out.  Set up your furniture so you have a clear path. Avoid moving your furniture around.  If any of your floors are uneven, fix them.  If there are any pets around you, be aware of where they are.  Review your medicines with your doctor. Some medicines can make you feel dizzy. This can increase your chance of falling. Ask your doctor what other things that you can do to help prevent falls. This information is not intended to replace advice given to you by your health care provider. Make sure you discuss any questions you have with your health care provider. Document Released: 05/30/2009 Document Revised: 01/09/2016 Document Reviewed: 09/07/2014 Elsevier Interactive Patient Education  2017 [employer].

## 2020-04-04 NOTE — Progress Notes (Signed)
I,Marcus Johnston,acting as a Education administrator for Marcus Greenland, MD.,have documented all relevant documentation on the behalf of Marcus Greenland, MD,as directed by  Marcus Greenland, MD while in the presence of Marcus Greenland, MD.  This visit occurred during the SARS-CoV-2 public health emergency.  Safety protocols were in place, including screening questions prior to the visit, additional usage of staff PPE, and extensive cleaning of exam room while observing appropriate contact time as indicated for disinfecting solutions.  Subjective:     Patient ID: Marcus Johnston , male    DOB: 1930-12-21 , 86 y.o.   MRN: [id number]   Chief Complaint  Patient presents with  . Annual Exam  . Diabetes  . Hypertension    HPI  Pt presents for full physical exam. He states that he is compliant with medication. He is followed by Dr [name] as his cardiologist. He does not have any complaints at this time.  Diabetes He presents for his follow-up diabetic visit. He has type 2 diabetes mellitus. There are no hypoglycemic associated symptoms. Pertinent negatives for diabetes include no blurred vision and no chest pain. There are no hypoglycemic complications. Risk factors for coronary artery disease include diabetes mellitus, dyslipidemia, hypertension and male sex. He is compliant with treatment some of the time. He is following a diabetic diet. He participates in exercise intermittently. An ACE inhibitor/angiotensin II receptor blocker is being taken.  Hypertension This is a chronic problem. The current episode started more than 1 year ago. The problem has been gradually improving since onset. The problem is controlled. Pertinent negatives include no blurred vision, chest pain, palpitations or shortness of breath. Risk factors for coronary artery disease include diabetes mellitus, dyslipidemia, male gender and sedentary lifestyle. There are no compliance problems.      Past Medical History:  Diagnosis Date  .  Atrial fibrillation ([place])   . Atypical atrial flutter ([place]) 10/17/2018  . Diabetes mellitus without complication ([place])   . Diabetic retinopathy ([place])    NPDR OU  . High cholesterol   . History of prostate cancer   . HTN (hypertension)   . Hypertensive retinopathy    OU  . Renal insufficiency   . Shingles      Family History  Problem Relation Age of Onset  . Cancer Mother   . Cancer Father      Current Outpatient Medications:  .  acetaminophen (TYLENOL) 325 MG tablet, Take 650 mg by mouth every 6 (six) hours as needed for mild pain or fever., Disp: , Rfl:  .  amLODipine (NORVASC) 5 MG tablet, Take 1 tablet by mouth 2 times per day, Disp: 180 tablet, Rfl: 1 .  Bromfenac Sodium (PROLENSA) 0.07 % SOLN, Place 1 drop into both eyes 4 (four) times daily., Disp: 6 mL, Rfl: 3 .  carvedilol (COREG) 12.5 MG tablet, Take 1 tablet (12.5 mg total) by mouth 2 (two) times daily with a meal., Disp: 180 tablet, Rfl: 1 .  Cholecalciferol (VITAMIN D3) 25 MCG (1000 UT) CAPS, Take 1,000 Units by mouth daily. , Disp: , Rfl:  .  doxazosin (CARDURA) 4 MG tablet, Take 0.5 tablets (2 mg total) by mouth daily with supper., Disp: , Rfl:  .  Empagliflozin (JARDIANCE PO), Take 25 mg by mouth once. 1 Tablet Daily with Breakfast, Disp: , Rfl:  .  fexofenadine (ALLEGRA) 180 MG tablet, Take 180 mg by mouth daily as needed (allergies.). , Disp: , Rfl:  .  insulin lispro protamine-lispro (HUMALOG 50/50  MIX) (50-50) 100 UNIT/ML SUSP injection, Inject 22-25 Units into the skin See admin instructions. Inject 25 units subcutaneously with breakfast and 22 units with supper, Disp: , Rfl:  .  Insulin Syringe-Needle U-100 (EASY TOUCH INSULIN SYRINGE) 30G X 5/16" 0.5 ML MISC, USE 3 TIMES DAILY, Disp: 270 each, Rfl: 3 .  losartan-hydrochlorothiazide (HYZAAR) 100-25 MG tablet, TAKE 1 TABLET BY MOUTH  DAILY, Disp: 90 tablet, Rfl: 1 .  metFORMIN (GLUCOPHAGE-XR) 500 MG 24 hr tablet, Take 1,000 mg by mouth 2 (two) times daily with a  meal. , Disp: , Rfl:  .  prednisoLONE acetate (PRED FORTE) 1 % ophthalmic suspension, Place 1 drop into both eyes 4 (four) times daily., Disp: 15 mL, Rfl: 0 .  doxazosin (CARDURA) 2 MG tablet, Take 2 mg by mouth daily. (Patient not taking: Reported on 04/26/2020), Disp: , Rfl:  .  LANTUS 100 UNIT/ML injection, Inject into the skin., Disp: , Rfl:  .  losartan (COZAAR) 50 MG tablet, , Disp: , Rfl:  .  NOVOLOG 100 UNIT/ML injection, Inject into the skin., Disp: , Rfl:    Allergies  Allergen Reactions  . Bidil [Isosorb Dinitrate-Hydralazine] Other (See Comments)    Severe hypotension episode after administering one dose of Bidil 20-37.5 mg on 09/25/2019.   . Statins Other (See Comments)    Leg pain     Men's preventive visit. Patient Health Questionnaire (PHQ-2) is    Office Visit from 04/04/2020 in [facility]  PHQ-2 Total Score 0    . Patient is on a healthy diet. Marital status: Married. Relevant history for alcohol use is:  Social History   Substance and Sexual Activity  Alcohol Use No  . Relevant history for tobacco use is:  Social History   Tobacco Use  Smoking Status Never Smoker  Smokeless Tobacco Never Used  .   Review of Systems  Constitutional: Negative.   HENT: Negative.   Eyes: Negative.  Negative for blurred vision.  Respiratory: Negative.  Negative for shortness of breath.   Cardiovascular: Negative.  Negative for chest pain and palpitations.  Gastrointestinal: Negative.   Endocrine: Negative.   Genitourinary: Negative.   Musculoskeletal: Negative.   Skin: Negative.   Allergic/Immunologic: Negative.   Neurological: Negative.   Hematological: Negative.   Psychiatric/Behavioral: Negative.      Today's Vitals   04/04/20 0914  BP: (!) 152/80  Pulse: 73  Temp: (!) 97.5 F (36.4 C)  TempSrc: Oral  Weight: 230 lb (104.3 kg)  Height: 6' 3"  (1.905 m)  PainSc: 0-No pain   Body mass index is 28.75 kg/m.   Objective:  Physical  Exam Vitals and nursing note reviewed.  Constitutional:      Appearance: Normal appearance.  HENT:     Head: Normocephalic and atraumatic.     Right Ear: Tympanic membrane, ear canal and external ear normal.     Left Ear: Tympanic membrane, ear canal and external ear normal.     Nose:     Comments: Deferred, masked    Mouth/Throat:     Comments: Deferred, masked Eyes:     Extraocular Movements: Extraocular movements intact.     Conjunctiva/sclera: Conjunctivae normal.     Pupils: Pupils are equal, round, and reactive to light.  Cardiovascular:     Rate and Rhythm: Normal rate and regular rhythm.     Pulses:          Dorsalis pedis pulses are 1+ on the right side and 1+ on the  left side.     Heart sounds: Normal heart sounds.  Pulmonary:     Effort: Pulmonary effort is normal.     Breath sounds: Normal breath sounds.  Chest:     Breasts:        Right: Normal. No swelling, bleeding, inverted nipple, mass or nipple discharge.        Left: Normal. No swelling, bleeding, inverted nipple, mass or nipple discharge.  Abdominal:     General: Bowel sounds are normal.     Palpations: Abdomen is soft.  Genitourinary:    Comments: Deferred, as per patient Musculoskeletal:        General: Normal range of motion.     Cervical back: Normal range of motion and neck supple.  Feet:     Right foot:     Protective Sensation: 5 sites tested. 5 sites sensed.     Skin integrity: Callus and dry skin present.     Toenail Condition: Right toenails are abnormally thick.     Left foot:     Protective Sensation: 5 sites tested. 5 sites sensed.     Skin integrity: Callus and dry skin present.     Toenail Condition: Left toenails are abnormally thick.  Skin:    General: Skin is warm.  Neurological:     General: No focal deficit present.     Mental Status: He is alert.  Psychiatric:        Mood and Affect: Mood normal.        Behavior: Behavior normal.         Assessment And Plan:    1.  Routine general medical examination at a health care facility Comments: A full exam was performed. DRE deferred, per patient's request.  PATIENT IS ADVISED TO GET 30-45 MINUTES REGULAR EXERCISE NO LESS THAN FOUR TO FIVE DAYS PER WEEK - BOTH WEIGHTBEARING EXERCISES AND AEROBIC ARE RECOMMENDED.  PATIENT IS ADVISED TO FOLLOW A HEALTHY DIET WITH AT LEAST SIX FRUITS/VEGGIES PER DAY, DECREASE INTAKE OF RED MEAT, AND TO INCREASE FISH INTAKE TO TWO DAYS PER WEEK.  MEATS/FISH SHOULD NOT BE FRIED, BAKED OR BROILED IS PREFERABLE.  I SUGGEST WEARING SPF 50 SUNSCREEN ON EXPOSED PARTS AND ESPECIALLY WHEN IN THE DIRECT SUNLIGHT FOR AN EXTENDED PERIOD OF TIME.  PLEASE AVOID FAST FOOD RESTAURANTS AND INCREASE YOUR WATER INTAKE.   2. Type 2 diabetes mellitus with other circulatory complications ([place]) Comments: Diabetic foot exam was performed.  He is still under the care of Dr. Buddy Duty. I DISCUSSED WITH THE PATIENT AT LENGTH REGARDING THE GOALS OF GLYCEMIC CONTROL AND POSSIBLE LONG-TERM COMPLICATIONS.  I  ALSO STRESSED THE IMPORTANCE OF COMPLIANCE WITH HOME GLUCOSE MONITORING, DIETARY RESTRICTIONS INCLUDING AVOIDANCE OF SUGARY DRINKS/PROCESSED FOODS,  ALONG WITH REGULAR EXERCISE.  I  ALSO STRESSED THE IMPORTANCE OF ANNUAL EYE EXAMS, SELF FOOT CARE AND COMPLIANCE WITH OFFICE VISITS.  - CBC - Hemoglobin A1c - CMP14+EGFR - VITAMIN D 25 Hydroxy (Vit-D Deficiency, Fractures)  3. Malignant HTN with heart disease, w/o CHF, with chronic kidney disease Comments: Chronic, uncontrolled. He will continue with current meds for now. He appears to be sensitive to slight adjustments in BP meds. Encouraged to follow low sodium diet. EKG performed, NSR, low voltage - no acute changes noted. He will rto in six months for re-evaluation.   4. Vitamin D deficiency Comments: I will check a vitamin D level and supplement as needed.   5. Overweight with body mass index (BMI) of 28 to 28.9 in  adult Comments: His weight is stable for his  demographic. Encouraged to gradually build up to exercising 30 minutes five days per week .   6. Nocturia Comments: He declined DRE.  - PSA   Patient was given opportunity to ask questions. Patient verbalized understanding of the plan and was able to repeat key elements of the plan. All questions were answered to their satisfaction.   Marcus Greenland, MD   I, Marcus Greenland, MD, have reviewed all documentation for this visit. The documentation on 04/28/20 for the exam, diagnosis, procedures, and orders are all accurate and complete.  THE PATIENT IS ENCOURAGED TO PRACTICE SOCIAL DISTANCING DUE TO THE COVID-19 PANDEMIC.

## 2020-04-04 NOTE — Patient Instructions (Signed)

## 2020-04-04 NOTE — Progress Notes (Signed)
This visit occurred during the SARS-CoV-2 public health emergency.  Safety protocols were in place, including screening questions prior to the visit, additional usage of staff PPE, and extensive cleaning of exam room while observing appropriate contact time as indicated for disinfecting solutions.  Subjective:   Marcus Johnston is a 86 y.o. male who presents for Medicare Annual/Subsequent preventive examination.  Review of Systems     Cardiac Risk Factors include: advanced age (>84men, >25 women);diabetes mellitus;hypertension;dyslipidemia;male gender;sedentary lifestyle     Objective:    Today's Vitals   04/04/20 0855  BP: (!) 152/80  Pulse: 73  Temp: (!) 97.5 F (36.4 C)  TempSrc: Oral  SpO2: 97%  Weight: 230 lb 3.2 oz (104.4 kg)  Height: 6\' 3"  (1.905 m)   Body mass index is 28.77 kg/m.  Advanced Directives 04/04/2020 12/17/2019 05/08/2019 04/30/2019 03/23/2019 10/28/2018 10/09/2018  Does Patient Have a Medical Advance Directive? No No No No No No No  Would patient like information on creating a medical advance directive? No - Patient declined No - Patient declined No - Patient declined No - Patient declined No - Patient declined No - Patient declined No - Patient declined    Current Medications (verified) Outpatient Encounter Medications as of 04/04/2020  Medication Sig  . acetaminophen (TYLENOL) 325 MG tablet Take 650 mg by mouth every 6 (six) hours as needed for mild pain or fever.  Marland Kitchen amLODipine (NORVASC) 5 MG tablet Take 1 tablet by mouth 2 times per day  . Bromfenac Sodium (PROLENSA) 0.07 % SOLN Place 1 drop into both eyes 4 (four) times daily.  . carvedilol (COREG) 12.5 MG tablet Take 1 tablet (12.5 mg total) by mouth 2 (two) times daily with a meal.  . Cholecalciferol (VITAMIN D3) 25 MCG (1000 UT) CAPS Take 1,000 Units by mouth daily.   Marland Kitchen doxazosin (CARDURA) 4 MG tablet Take 0.5 tablets (2 mg total) by mouth daily with supper.  . Empagliflozin (JARDIANCE PO) Take 25 mg by  mouth once. 1 Tablet Daily with Breakfast  . fexofenadine (ALLEGRA) 180 MG tablet Take 180 mg by mouth daily as needed (allergies.).   Marland Kitchen insulin lispro protamine-lispro (HUMALOG 50/50 MIX) (50-50) 100 UNIT/ML SUSP injection Inject 22-25 Units into the skin See admin instructions. Inject 25 units subcutaneously with breakfast and 22 units with supper  . Insulin Syringe-Needle U-100 (EASY TOUCH INSULIN SYRINGE) 30G X 5/16" 0.5 ML MISC USE 3 TIMES DAILY  . losartan-hydrochlorothiazide (HYZAAR) 100-25 MG tablet TAKE 1 TABLET BY MOUTH  DAILY  . metFORMIN (GLUCOPHAGE-XR) 500 MG 24 hr tablet Take 1,000 mg by mouth 2 (two) times daily with a meal.   . prednisoLONE acetate (PRED FORTE) 1 % ophthalmic suspension Place 1 drop into both eyes 4 (four) times daily.   No facility-administered encounter medications on file as of 04/04/2020.    Allergies (verified) Bidil [isosorb dinitrate-hydralazine] and Statins   History: Past Medical History:  Diagnosis Date  . Atrial fibrillation ([place])   . Atypical atrial flutter ([place]) 10/17/2018  . Diabetes mellitus without complication ([place])   . Diabetic retinopathy ([place])    NPDR OU  . High cholesterol   . History of prostate cancer   . HTN (hypertension)   . Hypertensive retinopathy    OU  . Renal insufficiency   . Shingles    Past Surgical History:  Procedure Laterality Date  . ATRIAL TACH ABLATION N/A 05/08/2019   Procedure: ATRIAL TACH ABLATION;  Surgeon: [name], MD;  Location: [facility];  Service: Cardiovascular;  Laterality: N/A;  . CATARACT EXTRACTION Bilateral   . EYE SURGERY Bilateral    Cat Sx  . prostate implant     Family History  Problem Relation Age of Onset  . Cancer Mother   . Cancer Father    Social History   Socioeconomic History  . Marital status: Married    Spouse name: Not on file  . Number of children: 5  . Years of education: Not on file  . Highest education level: Not on file  Occupational History  .  Occupation: retired  Tobacco Use  . Smoking status: Never Smoker  . Smokeless tobacco: Never Used  Vaping Use  . Vaping Use: Never used  Substance and Sexual Activity  . Alcohol use: No  . Drug use: No  . Sexual activity: Yes  Other Topics Concern  . Not on file  Social History Narrative  . Not on file   Social Determinants of Health   Financial Resource Strain: Low Risk   . Difficulty of Paying Living Expenses: Not hard at all  Food Insecurity: No Food Insecurity  . Worried About Charity fundraiser in the Last Year: Never true  . Ran Out of Food in the Last Year: Never true  Transportation Needs: No Transportation Needs  . Lack of Transportation (Medical): No  . Lack of Transportation (Non-Medical): No  Physical Activity: Insufficiently Active  . Days of Exercise per Week: 3 days  . Minutes of Exercise per Session: 30 min  Stress: No Stress Concern Present  . Feeling of Stress : Not at all  Social Connections:   . Frequency of Communication with Friends and Family: Not on file  . Frequency of Social Gatherings with Friends and Family: Not on file  . Attends Religious Services: Not on file  . Active Member of Clubs or Organizations: Not on file  . Attends Archivist Meetings: Not on file  . Marital Status: Not on file    Tobacco Counseling Counseling given: Not Answered   Clinical Intake:  Pre-visit preparation completed: Yes  Pain : No/denies pain     Nutritional Status: BMI 25 -29 Overweight Nutritional Risks: None Diabetes: Yes  How often do you need to have someone help you when you read instructions, pamphlets, or other written materials from your doctor or pharmacy?: 1 - Never What is the last grade level you completed in school?: associate degree  Diabetic? Yes Nutrition Risk Assessment:  Has the patient had any N/V/D within the last 2 months?  No  Does the patient have any non-healing wounds?  No  Has the patient had any unintentional  weight loss or weight gain?  No   Diabetes:  Is the patient diabetic?  Yes  If diabetic, was a CBG obtained today?  No  Did the patient bring in their glucometer from home?  No  How often do you monitor your CBG's? Twice daily.   Financial Strains and Diabetes Management:  Are you having any financial strains with the device, your supplies or your medication? No .  Does the patient want to be seen by Chronic Care Management for management of their diabetes?  No  Would the patient like to be referred to a Nutritionist or for Diabetic Management?  No   Diabetic Exams:  Diabetic Eye Exam: Completed 8/31/20201 Diabetic Foot Exam: Completed today  Interpreter Needed?: No  Information entered by :: NAllen LPN   Activities of Daily Living  In your present state of health, do you have any difficulty performing the following activities: 04/04/2020 05/09/2019  Hearing? N N  Vision? N N  Difficulty concentrating or making decisions? N N  Walking or climbing stairs? N N  Dressing or bathing? N N  Doing errands, shopping? N Y  Conservation officer, nature and eating ? N -  Using the Toilet? N -  In the past six months, have you accidently leaked urine? N -  Do you have problems with loss of bowel control? N -  Managing your Medications? N -  Managing your Finances? N -  Housekeeping or managing your Housekeeping? N -  Some recent data might be hidden    Patient Care Team: Glendale Chard, MD as PCP - General (Internal Medicine) Buford Dresser, MD as PCP - Cardiology (Cardiology) Caudill, Kennieth Francois, [facility] (Pharmacist) Daneen Schick as Social Worker  Indicate any recent Medical Services you may have received from other than [facility] providers in the past year (date may be approximate).     Assessment:   This is a routine wellness examination for Marcus Johnston.  Hearing/Vision screen  Hearing Screening   125Hz  250Hz  500Hz  1000Hz  2000Hz  3000Hz  4000Hz  6000Hz  8000Hz   Right ear:           Left  ear:           Vision Screening Comments: Regular eye exams, Dr. Adria Dill  Dietary issues and exercise activities discussed: Current Exercise Habits: Home exercise routine, Type of exercise: walking, Time (Minutes): 30, Frequency (Times/Week): 3, Weekly Exercise (Minutes/Week): 90  Goals    . Diabetes: Goal A1c <7%     CARE PLAN ENTRY  Current Barriers:  . Diabetes: Uncontrolled type 2; complicated by chronic medical conditions including kidney disease and hypertension Lab Results  Component Value Date   HGBA1C 8.0 (H) 12/18/2019 .   Lab Results  Component Value Date   CREATININE 1.08 12/20/2019   CREATININE 1.17 12/19/2019   CREATININE 1.65 (H) 12/17/2019   . Current antihyperglycemic regimen:  o Metformin XR 500mg  2 tablets twice daily o Humalog 50/50 Mix 25 units with breakfast, 22 units with supper . Denies hypoglycemic symptoms, including dizziness, lightheadedness, shaking, sweating . Denies hyperglycemic symptoms, including polyuria, polydipsia, polyphagia, nocturia, blurred vision, neuropathy . Current exercise: 30 minutes daily, 3 days a week . Current blood glucose readings:  o Recent FBG Readings: 147, 145, 174, 163, 135, 131, 205, 129, 116, 116, 113, 116 o Recent evening pre-meal BG readings (6/7pm): 121, 109, 93, 135, 160, 93, 140, 119, 112, 193, 110, 161 . Cardiovascular risk reduction: o Current hypertensive regimen:  - Carvedilol 12.5mg  twice daily with a meal - Doxazosin 4mg  1/2 tablet daily (prostate) - Losartan/HCTZ 100-25mg  daily - Amlodipine 10mg  1/2 tablet twice daily o Current hyperlipidemia regimen: N/A o Current antiplatelet regimen: N/A  Pharmacist Clinical Goal(s):  Marland Kitchen Over the next 90 days, patient will work with PharmD, endocrinologist, and primary care provider to address elevated blood sugars and A1c above goal.  Interventions: . Comprehensive medication review performed, medication list updated in electronic medical record . Patient's blood  sugars have improved since our last appointment, but morning blood sugars are still elevated . Encouraged patient to record any low records that occur at night . Encouraged healthy diet and 150 minutes of exercise per week  Patient Self Care Activities:  . Patient will check blood glucose twice daily over the next 90 days document, and provide at future appointments . Patient will focus on medication  adherence by continued use of weekly pill box over the next 90 days . Patient will increase exercise to 30 minutes 5 times weekly over the next 90 days . Patient will limit soda intake over the next 90 days . Patient will take medications as prescribed . Patient will contact provider with any episodes of hypoglycemia . Patient will report any questions or concerns to provider   Please see past updates related to this goal by clicking on the "Past Updates" button in the selected goal      . High Cholesterol Management     CARE PLAN ENTRY  Current Barriers:  . Controlled hyperlipidemia, complicated by diabetes . Current antihyperlipidemic regimen: N/A . Previous antihyperlipidemic medications: Patient has tried statins in the past . Most recent lipid panel:  Lipid Panel     Component Value Date/Time   CHOL 170 12/19/2019 0135   CHOL 158 03/23/2019 1339   TRIG 63 12/19/2019 0135   HDL 62 12/19/2019 0135   HDL 76 03/23/2019 1339   CHOLHDL 2.7 12/19/2019 0135   VLDL 13 12/19/2019 0135   LDLCALC 95 12/19/2019 0135   LDLCALC 68 03/23/2019 1339   LABVLDL 14 03/23/2019 1339    . ASCVD risk enhancing conditions: age >87, DM, HTN, CKD  Pharmacist Clinical Goal(s):  Marland Kitchen Over the next 90 days patient will work with PharmD and providers towards optimized antihyperlipidemic therapy  Interventions: . Comprehensive medication review performed; medication list updated in electronic medical record.  . Discussed diet and exercise as a way to manage cholesterol  Patient Self Care Activities:   . Patient will focus on heart healthy diet and 150 minutes of exercise per week over the next 90 days  Please see past updates related to this goal by clicking on the "Past Updates" button in the selected goal      . Hypertension: Goal BP <140/90     CARE PLAN ENTRY  Current Barriers:  . Uncontrolled hypertension, complicated by diabetes and chronic kidney disease . Current antihypertensive regimen:  o Carvedilol 12.5mg  twice daily with a meal o Doxazosin 4mg  1/2 tablet daily (prostate) o Losartan/HCTZ 100-25mg  daily o Amlodipine 10mg  1/2 tablet twice daily . Previous antihypertensives tried: Diltiazem, hydralazine, metoprolol . Last practice recorded BP readings:  BP Readings from Last 3 Encounters:  12/26/19 (!) 146/78  12/20/19 138/88  11/01/19 (!) 204/91   . Current home BP readings:  130/70, 135/75, 130/60, 110/60, 135/65  Pharmacist Clinical Goal(s):  Marland Kitchen Over the next 90 days, patient will work with PharmD and providers to optimize antihypertensive regimen  Interventions: . Comprehensive medication review performed; medication list updated in the electronic medical record.  . Counseled patient on importance of diet and exercise modifications . Encouraged patient on blood pressure improvement since last visit  Patient Self Care Activities:  . Patient will continue to check BP 3 times weekly and if symptomatic over the next 90 days, document, and provide at future appointments . Patient will focus on medication adherence by continued use of pillbox over the next 90 days  Please see past updates related to this goal by clicking on the "Past Updates" button in the selected goal      . Patient Stated     03/23/2019, no goals    . Patient Stated     04/04/2020, no goals      Depression Screen PHQ 2/9 Scores 04/04/2020 04/03/2020 03/23/2019 10/17/2018 09/12/2018  PHQ - 2 Score 0 0 0 0 0  PHQ- 9  Score 0 - 0 - -    Fall Risk Fall Risk  04/04/2020 05/15/2019 03/23/2019 10/17/2018  09/12/2018  Falls in the past year? 0 1 0 0 0  Number falls in past yr: - 0 - - -  Injury with Fall? - 0 - - -  Risk for fall due to : Medication side effect - Medication side effect - -  Follow up Falls evaluation completed;Education provided;Falls prevention discussed - Falls evaluation completed;Education provided;Falls prevention discussed - -    Any stairs in or around the home? Yes  If so, are there any without handrails? No  Home free of loose throw rugs in walkways, pet beds, electrical cords, etc? Yes  Adequate lighting in your home to reduce risk of falls? Yes   ASSISTIVE DEVICES UTILIZED TO PREVENT FALLS:  Life alert? No  Use of a cane, walker or w/c? No  Grab bars in the bathroom? No  Shower chair or bench in shower? No  Elevated toilet seat or a handicapped toilet? No   TIMED UP AND GO:  Was the test performed? No . .   Gait steady and fast without use of assistive device  Cognitive Function:     6CIT Screen 04/04/2020 03/23/2019  What Year? 0 points 0 points  What month? 0 points 0 points  What time? 0 points 0 points  Count back from 20 0 points 0 points  Months in reverse 0 points 2 points  Repeat phrase 4 points 0 points  Total Score 4 2    Immunizations Immunization History  Administered Date(s) Administered  . Fluad Quad(high Dose 65+) 05/02/2019  . PFIZER SARS-COV-2 Vaccination 09/22/2019, 10/13/2019    TDAP status: Up to date Flu Vaccine status: Up to date Pneumococcal vaccine status: Declined,  Education has been provided regarding the importance of this vaccine but patient still declined. Advised may receive this vaccine at local pharmacy or Health Dept. Aware to provide a copy of the vaccination record if obtained from local pharmacy or Health Dept. Verbalized acceptance and understanding.  Covid-19 vaccine status: Completed vaccines  Qualifies for Shingles Vaccine? Yes   Zostavax completed No   Shingrix Completed?: No.    Education has been  provided regarding the importance of this vaccine. Patient has been advised to call insurance company to determine out of pocket expense if they have not yet received this vaccine. Advised may also receive vaccine at local pharmacy or Health Dept. Verbalized acceptance and understanding.  Screening Tests Health Maintenance  Topic Date Due  . FOOT EXAM  11/16/2019  . INFLUENZA VACCINE  03/17/2020  . PNA vac Low Risk Adult (1 of 2 - PCV13) 04/04/2021 (Originally 04/22/1998)  . HEMOGLOBIN A1C  06/19/2020  . OPHTHALMOLOGY EXAM  03/29/2021  . TETANUS/TDAP  07/20/2021  . COVID-19 Vaccine  Completed    Health Maintenance  Health Maintenance Due  Topic Date Due  . FOOT EXAM  11/16/2019  . INFLUENZA VACCINE  03/17/2020    Colorectal cancer screening: No longer required.   Lung Cancer Screening: (Low Dose CT Chest recommended if Age 68-80 years, 30 pack-year currently smoking OR have quit w/in 15years.) does not qualify.   Lung Cancer Screening Referral: no  Additional Screening:  Hepatitis C Screening: does not qualify;   Vision Screening: Recommended annual ophthalmology exams for early detection of glaucoma and other disorders of the eye. Is the patient up to date with their annual eye exam?  Yes  Who is the provider or what  is the name of the office in which the patient attends annual eye exams? Dr. Adria Dill If pt is not established with a provider, would they like to be referred to a provider to establish care? No .   Dental Screening: Recommended annual dental exams for proper oral hygiene  Community Resource Referral / Chronic Care Management: CRR required this visit?  No   CCM required this visit?  No      Plan:     I have personally reviewed and noted the following in the patient's chart:   . Medical and social history . Use of alcohol, tobacco or illicit drugs  . Current medications and supplements . Functional ability and status . Nutritional status . Physical  activity . Advanced directives . List of other physicians . Hospitalizations, surgeries, and ER visits in previous 12 months . Vitals . Screenings to include cognitive, depression, and falls . Referrals and appointments  In addition, I have reviewed and discussed with patient certain preventive protocols, quality metrics, and best practice recommendations. A written personalized care plan for preventive services as well as general preventive health recommendations were provided to patient.     Kellie Simmering, LPN   3/46/[id number]   Nurse Notes:

## 2020-04-05 LAB — CMP14+EGFR
ALT: 10 IU/L (ref 0–44)
AST: 13 IU/L (ref 0–40)
Albumin/Globulin Ratio: 1.7 (ref 1.2–2.2)
Albumin: 4.6 g/dL (ref 3.6–4.6)
Alkaline Phosphatase: 91 IU/L (ref 48–121)
BUN/Creatinine Ratio: 18 (ref 10–24)
BUN: 26 mg/dL (ref 8–27)
Bilirubin Total: 0.5 mg/dL (ref 0.0–1.2)
CO2: 24 mmol/L (ref 20–29)
Calcium: 9.8 mg/dL (ref 8.6–10.2)
Chloride: 102 mmol/L (ref 96–106)
Creatinine, Ser: 1.44 mg/dL — ABNORMAL HIGH (ref 0.76–1.27)
GFR calc Af Amer: 50 mL/min/{1.73_m2} — ABNORMAL LOW (ref >59)
GFR calc non Af Amer: 44 mL/min/{1.73_m2} — ABNORMAL LOW (ref >59)
Globulin, Total: 2.7 g/dL (ref 1.5–4.5)
Glucose: 135 mg/dL — ABNORMAL HIGH (ref 65–99)
Potassium: 4.5 mmol/L (ref 3.5–5.2)
Sodium: 140 mmol/L (ref 134–144)
Total Protein: 7.3 g/dL (ref 6.0–8.5)

## 2020-04-05 LAB — CBC
Hematocrit: 44.9 % (ref 37.5–51.0)
Hemoglobin: 14.8 g/dL (ref 13.0–17.7)
MCH: 27.7 pg (ref 26.6–33.0)
MCHC: 33 g/dL (ref 31.5–35.7)
MCV: 84 fL (ref 79–97)
Platelets: 177 10*3/uL (ref 150–450)
RBC: 5.35 x10E6/uL (ref 4.14–5.80)
RDW: 13 % (ref 11.6–15.4)
WBC: 4.3 10*3/uL (ref 3.4–10.8)

## 2020-04-05 LAB — HEMOGLOBIN A1C
Est. average glucose Bld gHb Est-mCnc: 174 mg/dL
Hgb A1c MFr Bld: 7.7 % — ABNORMAL HIGH (ref 4.8–5.6)

## 2020-04-05 LAB — VITAMIN D 25 HYDROXY (VIT D DEFICIENCY, FRACTURES): Vit D, 25-Hydroxy: 31.5 ng/mL (ref 30.0–100.0)

## 2020-04-05 LAB — PSA: Prostate Specific Ag, Serum: 0.3 ng/mL (ref 0.0–4.0)

## 2020-04-11 DIAGNOSIS — E1165 Type 2 diabetes mellitus with hyperglycemia: Secondary | ICD-10-CM | POA: Diagnosis not present

## 2020-04-19 ENCOUNTER — Other Ambulatory Visit: Payer: Self-pay

## 2020-04-25 NOTE — Progress Notes (Signed)
[facility] Note  04/26/2020     CHIEF COMPLAINT Patient presents for Retina Follow Up   HISTORY OF PRESENT ILLNESS: Marcus Johnston is a 86 y.o. male who presents to the clinic today for:   HPI    Retina Follow Up    Patient presents with  Diabetic Retinopathy.  In both eyes.  This started months ago.  Severity is moderate.  Duration of 4 weeks.  Since onset it is stable.  I, the attending physician,  performed the HPI with the patient and updated documentation appropriately.          Comments    86 y/o male pt here for 4 wk f/u for NPDR w/DME OU.  No change in [place] OU.  Denies pain, FOL, floaters.  Prolensa and Pred QID OU.  BS 135 this a.m.  A1C 7.7.       Last edited by [name], MD on 04/26/2020 12:14 PM. (History)    Patient states he feels like his vision is holding its own, there might be small changes he can see, he states his reading glasses aren't working for him  Referring physician: [name], MD [address] [place],  [place] [address]  HISTORICAL INFORMATION:   Selected notes from the [facility] Referred by Dr. [name] for CME OU   CURRENT MEDICATIONS: Current Outpatient Medications (Ophthalmic Drugs)  Medication Sig  . Bromfenac Sodium (PROLENSA) 0.07 % SOLN Place 1 drop into both eyes 4 (four) times daily.  . prednisoLONE acetate (PRED FORTE) 1 % ophthalmic suspension Place 1 drop into both eyes 4 (four) times daily.   No current facility-administered medications for this visit. (Ophthalmic Drugs)   Current Outpatient Medications (Other)  Medication Sig  . acetaminophen (TYLENOL) 325 MG tablet Take 650 mg by mouth every 6 (six) hours as needed for mild pain or fever.  Marland Kitchen amLODipine (NORVASC) 5 MG tablet Take 1 tablet by mouth 2 times per day  . carvedilol (COREG) 12.5 MG tablet Take 1 tablet (12.5 mg total) by mouth 2 (two) times daily with a meal.  . Cholecalciferol (VITAMIN D3) 25 MCG (1000  UT) CAPS Take 1,000 Units by mouth daily.   Marland Kitchen doxazosin (CARDURA) 4 MG tablet Take 0.5 tablets (2 mg total) by mouth daily with supper.  . Empagliflozin (JARDIANCE PO) Take 25 mg by mouth once. 1 Tablet Daily with Breakfast  . fexofenadine (ALLEGRA) 180 MG tablet Take 180 mg by mouth daily as needed (allergies.).   Marland Kitchen insulin lispro protamine-lispro (HUMALOG 50/50 MIX) (50-50) 100 UNIT/ML SUSP injection Inject 22-25 Units into the skin See admin instructions. Inject 25 units subcutaneously with breakfast and 22 units with supper  . Insulin Syringe-Needle U-100 (EASY TOUCH INSULIN SYRINGE) 30G X 5/16" 0.5 ML MISC USE 3 TIMES DAILY  . LANTUS 100 UNIT/ML injection Inject into the skin.  Marland Kitchen losartan (COZAAR) 50 MG tablet   . losartan-hydrochlorothiazide (HYZAAR) 100-25 MG tablet TAKE 1 TABLET BY MOUTH  DAILY  . metFORMIN (GLUCOPHAGE-XR) 500 MG 24 hr tablet Take 1,000 mg by mouth 2 (two) times daily with a meal.   . NOVOLOG 100 UNIT/ML injection Inject into the skin.  Marland Kitchen doxazosin (CARDURA) 2 MG tablet Take 2 mg by mouth daily. (Patient not taking: Reported on 04/26/2020)   No current facility-administered medications for this visit. (Other)      REVIEW OF SYSTEMS: ROS    Positive for: Endocrine, Cardiovascular, Eyes  Negative for: Constitutional, Gastrointestinal, Neurological, Skin, Genitourinary, Musculoskeletal, HENT, Respiratory, Psychiatric, Allergic/Imm, Heme/Lymph   Last edited by [name], COA on 04/26/2020 10:01 AM. (History)       ALLERGIES Allergies  Allergen Reactions  . Bidil [Isosorb Dinitrate-Hydralazine] Other (See Comments)    Severe hypotension episode after administering one dose of Bidil 20-37.5 mg on 09/25/2019.   . Statins Other (See Comments)    Leg pain    PAST MEDICAL HISTORY Past Medical History:  Diagnosis Date  . Atrial fibrillation ([place])   . Atypical atrial flutter ([place]) 10/17/2018  . Diabetes mellitus without complication ([place])   . Diabetic  retinopathy ([place])    NPDR OU  . High cholesterol   . History of prostate cancer   . HTN (hypertension)   . Hypertensive retinopathy    OU  . Renal insufficiency   . Shingles    Past Surgical History:  Procedure Laterality Date  . ATRIAL TACH ABLATION N/A 05/08/2019   Procedure: ATRIAL TACH ABLATION;  Surgeon: [name], MD;  Location: [facility];  Service: Cardiovascular;  Laterality: N/A;  . CATARACT EXTRACTION Bilateral   . EYE SURGERY Bilateral    Cat Sx  . prostate implant      FAMILY HISTORY Family History  Problem Relation Age of Onset  . Cancer Mother   . Cancer Father     SOCIAL HISTORY Social History   Tobacco Use  . Smoking status: Never Smoker  . Smokeless tobacco: Never Used  Vaping Use  . Vaping Use: Never used  Substance Use Topics  . Alcohol use: No  . Drug use: No         OPHTHALMIC EXAM:  Base Eye Exam    Visual Acuity (Snellen - Linear)      Right Left   Dist [place] 20/150 - 20/150 -2   Dist ph [place] 20/40 +2 20/70 +2       Tonometry (Tonopen, 10:05 AM)      Right Left   Pressure 15 13       Pupils      Dark Light Shape React APD   Right 3 2 Round Brisk None   Left 3 2 Round Brisk None       Visual Fields (Counting fingers)      Left Right    Full Full       Extraocular Movement      Right Left    Full, Ortho Full, Ortho       Neuro/Psych    Oriented x3: Yes   Mood/Affect: Normal       Dilation    Both eyes: 1.0% Mydriacyl, 2.5% Phenylephrine @ 10:05 AM        Slit Lamp and Fundus Exam    Slit Lamp Exam      Right Left   Lids/Lashes Dermatochalasis - upper lid, mild Meibomian gland dysfunction Dermatochalasis - upper lid, mild Meibomian gland dysfunction   Conjunctiva/Sclera Mild Melanosis Mild Melanosis   Cornea Trace Punctate epithelial erosions 1+ Punctate epithelial erosions. endopigment   Anterior Chamber Deep and quiet Deep and quiet   Iris Round and dilated, No NVI Round and dilated, No NVI    Lens Toric PC IOL with marks at 1200 and 0600 PC IOL in good position with PC folds   Vitreous Vitreous syneresis, PVD and vitreous condensations Vitreous syneresis       Fundus Exam      Right Left   Disc  Mild Pallor, mild, temporal Peripapillary atrophy, Sharp rim temporal Peripapillary atrophy, +cupping, mild Pallor, Sharp rim   C/D Ratio 0.6 0.65   Macula Blunted foveal reflex, +central IRH; pigment clumping, Microaneurysms, persistent central cystic changes -- slightly increased Blunted foveal reflex, Retinal pigment epithelial mottling and clumping, focal Exudates - improved, +IRH resolved, +central Cystic changes -- increased, scattered Microaneurysms   Vessels Vascular attenuation, Tortuous Vascular attenuation, mild Copper wiring, AV crossing changes   Periphery Attached, large HST at 0500 with surrounding SRF, +pigmented barrier laser scar from 0430-0600    Attached, small pigmented operculum at 0730 midzone--stable, no SRF             IMAGING AND PROCEDURES  Imaging and Procedures for @TODAY @  OCT, Retina - OU - Both Eyes       Right Eye Quality was good. Central Foveal Thickness: 423. Progression has worsened. Findings include abnormal foveal contour, intraretinal fluid, no SRF, intraretinal hyper-reflective material (Interval increase in IRF/IRHM; Trace ERM).   Left Eye Quality was good. Central Foveal Thickness: 287. Progression has worsened. Findings include intraretinal fluid, abnormal foveal contour, intraretinal hyper-reflective material, no SRF, outer retinal atrophy (Mild ERM; mild interval increase in IRF/IRHM).   Notes *Images captured and stored on drive  Diagnosis / Impression:  OD: interval increase in IRF/IRHM; Trace ERM OS: Mild ERM; interval increase in IRF/IRHM  Clinical management:  See below  Abbreviations: NFP - Normal foveal profile. CME - cystoid macular edema. PED - pigment epithelial detachment. IRF - intraretinal fluid. SRF - subretinal  fluid. EZ - ellipsoid zone. ERM - epiretinal membrane. ORA - outer retinal atrophy. ORT - outer retinal tubulation. SRHM - subretinal hyper-reflective material        Intravitreal Injection, Pharmacologic Agent - OD - Right Eye       Time Out 04/26/2020. 11:03 AM. Confirmed correct patient, procedure, site, and patient consented.   Anesthesia Topical anesthesia was used. Anesthetic medications included Lidocaine 2%, Proparacaine 0.5%.   Procedure Preparation included 5% betadine to ocular surface, eyelid speculum. A (32g) needle was used.   Injection:  4 mg Triescence 40mg /mlL injection   NDC: [phone], Lot: [id number], Expiration date: 04/16/2021   Route: Intravitreal, Site: Right Eye, Waste: 0.9 mL  Post-op Post injection exam found visual acuity of at least counting fingers. The patient tolerated the procedure well. There were no complications. The patient received written and verbal post procedure care education. Post injection medications were not given.   Notes An AC tap was performed following injection due to elevated IOP using a 30 gauge needle on a syringe with the plunger removed. The needle was placed at the limbus at 7 oclock and approximately 0.06 cc of aqueous was removed from the anterior chamber. Betadine was applied to the tap area before and after the paracentesis was performed. There were no complications. The patient tolerated the procedure well. The IOP was rechecked and was found to be 8 mmHg by palpation.        Intravitreal Injection, Pharmacologic Agent - OS - Left Eye       Time Out 04/26/2020. 11:04 AM. Confirmed correct patient, procedure, site, and patient consented.   Anesthesia Topical anesthesia was used. Anesthetic medications included Lidocaine 2%, Proparacaine 0.5%.   Procedure Preparation included 5% betadine to ocular surface, eyelid speculum. A (32g) needle was used.   Injection:  2 mg aflibercept Alfonse Flavors) SOLN   NDC: 74163-845-36, Lot:  [id number], Expiration date: 06/17/2020   Route: Intravitreal, Site: Left Eye, Waste:  0.05 mL  Post-op Post injection exam found visual acuity of at least counting fingers. The patient tolerated the procedure well. There were no complications. The patient received written and verbal post procedure care education. Post injection medications were not given.                 ASSESSMENT/PLAN:    ICD-10-CM   1. Moderate nonproliferative diabetic retinopathy of both eyes with macular edema associated with type 2 diabetes mellitus (HCC)  Q22.9798 Intravitreal Injection, Pharmacologic Agent - OD - Right Eye    Intravitreal Injection, Pharmacologic Agent - OS - Left Eye    triamcinolone acetonide (TRIESENCE) 40 MG/ML subtenons injection 4 mg    aflibercept (EYLEA) SOLN 2 mg  2. Retinal edema  H35.81 OCT, Retina - OU - Both Eyes  3. History of retinal detachment  Z86.69   4. Essential hypertension  I10   5. Hypertensive retinopathy of both eyes  H35.033   6. Pseudophakia of both eyes  Z96.1     1,2.  Non-proliferative diabetic retinopathy w/ macular edema OU  - patient with long standing history of macular edema -- was seen by Dr. Zigmund Daniel back in 2013  - has also seen Dr. Baird Cancer, Dr. Posey Pronto, [place] service at [facility]  - last retina f/u in 2019 with Dr. Posey Pronto and at [facility]  - pt has previously received intravitreal injections OU, but does not like them and felt like benefits from injections were minmal  - at initial visit, was using PF and ketorolac QID OU -- pt self d/c  - exam shows scattered/minimal MA but central edema OU  - FA (12.11.20) shows late leaking MA, OS shows capillary nonperfusion, OD with ?petaloid central hyperfluorescence -- ?CME component  - s/p IVA# 1 OD (12.11.20)  - s/p IVE #1 OD (01.20.21), #2 (02.17.21), #3 (03.17.21), #4 (05.13.21), #5 (06.11.21), #6 (07.14.21), #7 (08.13.21)  - s/p IVE# 1 OS (12.11.20), #2 (01.20.21), #3 (02.17.21), #4 (03.17.21), #5  (04.15.21), #6 (4.15.21), #7 (06.11.21), #8 (07.14.21), #9 (08.13.21)             - s/p IVTA OD #1 (04.15.21)  - s/p IVTA OS #1 (05.13.21)  - good response to IVTA  - BCVA OD: 20/40 (worse); OS: 20/70 (improved)  - OCT shows interval increase in IRF/IRHM OU  - Eylea4U benefits investigation initiated 12.11.20 -- approved for 2021             - Recommend IVTA OD #2 and IVE OS #10 today, 09.10.21  - pt wishes to proceed  - RBA of procedure discussed, questions answered  - informed consent obtained  - see procedure notes -- AC tap OD post injection  - Eylea informed consent form signed and scanned on 01.20.21  - IVTA informed consent form signed and scanned on 04.15.21  - cont PF QID OU and Prolensa QID OU  - f/u in 4 wks -- DFE/OCT/possible injection **will go back to IVTA if IVE fails to improve IRF/DME**  3. History of inferior HST w/ focal RD OD s/p laser retinopexy  - HST at 0500 w/ surrounding SRF  - good barricade laser in place spanning 0430-0600  - stable  4,5. Hypertensive retinopathy OU  - discussed importance of tight BP control  - monitor   6. Pseudophakia OU  - s/p CE/IOL OU  - beautiful surgeries, doing well  - monitor   Ophthalmic Meds Ordered this visit:  Meds ordered this encounter  Medications  . triamcinolone  acetonide (TRIESENCE) 40 MG/ML subtenons injection 4 mg  . aflibercept (EYLEA) SOLN 2 mg       Return in about 4 weeks (around 05/24/2020) for f/u NPDR OU, DFE, OCT.  There are no Patient Instructions on file for this visit.   Explained the diagnoses, plan, and follow up with the patient and they expressed understanding.  Patient expressed understanding of the importance of proper follow up care.   This document serves as a record of services personally performed by Gardiner Sleeper, MD, PhD. It was created on their behalf by San Jetty. Owens Shark, OA an ophthalmic technician. The creation of this record is the provider's dictation and/or activities  during the visit.    Electronically signed by: San Jetty. Owens Shark, [place] 09.09.2021 12:18 PM  Gardiner Sleeper, M.D., Ph.D. Diseases & Surgery of the Retina and Vitreous [facility]  I have reviewed the above documentation for accuracy and completeness, and I agree with the above. Gardiner Sleeper, M.D., Ph.D. 04/26/20 12:21 PM    Abbreviations: M myopia (nearsighted); A astigmatism; H hyperopia (farsighted); P presbyopia; Mrx spectacle prescription;  CTL contact lenses; OD right eye; OS left eye; OU both eyes  XT exotropia; ET esotropia; PEK punctate epithelial keratitis; PEE punctate epithelial erosions; DES dry eye syndrome; MGD meibomian gland dysfunction; ATs artificial tears; PFAT's preservative free artificial tears; [place] nuclear sclerotic cataract; PSC posterior subcapsular cataract; ERM epi-retinal membrane; PVD posterior vitreous detachment; RD retinal detachment; DM diabetes mellitus; DR diabetic retinopathy; NPDR non-proliferative diabetic retinopathy; PDR proliferative diabetic retinopathy; CSME clinically significant macular edema; DME diabetic macular edema; dbh dot blot hemorrhages; CWS cotton wool spot; POAG primary open angle glaucoma; C/D cup-to-disc ratio; HVF humphrey visual field; GVF goldmann visual field; OCT optical coherence tomography; IOP intraocular pressure; BRVO Branch retinal vein occlusion; CRVO central retinal vein occlusion; CRAO central retinal artery occlusion; BRAO branch retinal artery occlusion; RT retinal tear; SB scleral buckle; PPV pars plana vitrectomy; VH Vitreous hemorrhage; PRP panretinal laser photocoagulation; IVK intravitreal kenalog; VMT vitreomacular traction; MH Macular hole;  NVD neovascularization of the disc; NVE neovascularization elsewhere; AREDS age related eye disease study; ARMD age related macular degeneration; POAG primary open angle glaucoma; EBMD epithelial/anterior basement membrane dystrophy; ACIOL anterior chamber  intraocular lens; IOL intraocular lens; PCIOL posterior chamber intraocular lens; Phaco/IOL phacoemulsification with intraocular lens placement; [place] photorefractive keratectomy; LASIK laser assisted in situ keratomileusis; HTN hypertension; DM diabetes mellitus; COPD chronic obstructive pulmonary disease

## 2020-04-26 ENCOUNTER — Other Ambulatory Visit: Payer: Self-pay

## 2020-04-26 ENCOUNTER — Ambulatory Visit (INDEPENDENT_AMBULATORY_CARE_PROVIDER_SITE_OTHER): Payer: Medicare Other | Admitting: Ophthalmology

## 2020-04-26 ENCOUNTER — Encounter (INDEPENDENT_AMBULATORY_CARE_PROVIDER_SITE_OTHER): Payer: Self-pay | Admitting: Ophthalmology

## 2020-04-26 DIAGNOSIS — Z8669 Personal history of other diseases of the nervous system and sense organs: Secondary | ICD-10-CM

## 2020-04-26 DIAGNOSIS — H3581 Retinal edema: Secondary | ICD-10-CM

## 2020-04-26 DIAGNOSIS — E113313 Type 2 diabetes mellitus with moderate nonproliferative diabetic retinopathy with macular edema, bilateral: Secondary | ICD-10-CM | POA: Diagnosis not present

## 2020-04-26 DIAGNOSIS — H35033 Hypertensive retinopathy, bilateral: Secondary | ICD-10-CM

## 2020-04-26 DIAGNOSIS — I10 Essential (primary) hypertension: Secondary | ICD-10-CM

## 2020-04-26 DIAGNOSIS — Z961 Presence of intraocular lens: Secondary | ICD-10-CM

## 2020-04-26 MED ORDER — AFLIBERCEPT 2MG/0.05ML IZ SOLN FOR KALEIDOSCOPE
2.0000 mg | INTRAVITREAL | Status: AC | PRN
  Administered 2020-04-26: 2 mg via INTRAVITREAL

## 2020-04-26 MED ORDER — TRIAMCINOLONE ACETONIDE 40 MG/ML IO SUSP
4.0000 mg | INTRAOCULAR | Status: AC | PRN
  Administered 2020-04-26: 4 mg via INTRAVITREAL

## 2020-05-02 DIAGNOSIS — Z794 Long term (current) use of insulin: Secondary | ICD-10-CM | POA: Diagnosis not present

## 2020-05-02 DIAGNOSIS — E1122 Type 2 diabetes mellitus with diabetic chronic kidney disease: Secondary | ICD-10-CM | POA: Diagnosis not present

## 2020-05-02 DIAGNOSIS — I10 Essential (primary) hypertension: Secondary | ICD-10-CM | POA: Diagnosis not present

## 2020-05-02 DIAGNOSIS — N1831 Chronic kidney disease, stage 3a: Secondary | ICD-10-CM | POA: Diagnosis not present

## 2020-05-02 NOTE — Progress Notes (Signed)
[facility] Note  05/03/2020     CHIEF COMPLAINT Patient presents for Retina Follow Up   HISTORY OF PRESENT ILLNESS: Marcus Johnston is a 86 y.o. male who presents to the clinic today for:   HPI    Retina Follow Up    Patient presents with  Diabetic Retinopathy.  I, the attending physician,  performed the HPI with the patient and updated documentation appropriately.          Comments    Since injection last week something has covered his eye OD like a film with debris trapped inside.  Big decrease in vision as well.  After all the injections he has had this is different. Denies using eye drops. BS: 145 this am, A1C: 7.7       Last edited by [name], MD on 05/03/2020  9:55 AM. (History)    Patient states he has had steroid injection in the past in his right eye and has never had decreased vision as a result. Patient states his vision is foggy and states it seems as though there is a film over his vision in his right eye.  Referring physician: [name], MD [address] [place],  [place] [address]  HISTORICAL INFORMATION:   Selected notes from the MEDICAL RECORD NUMBER Referred by Dr. [name] for CME OU   CURRENT MEDICATIONS: Current Outpatient Medications (Ophthalmic Drugs)  Medication Sig  . Bromfenac Sodium (PROLENSA) 0.07 % SOLN Place 1 drop into both eyes 4 (four) times daily.  . prednisoLONE acetate (PRED FORTE) 1 % ophthalmic suspension Place 1 drop into both eyes 4 (four) times daily.   No current facility-administered medications for this visit. (Ophthalmic Drugs)   Current Outpatient Medications (Other)  Medication Sig  . acetaminophen (TYLENOL) 325 MG tablet Take 650 mg by mouth every 6 (six) hours as needed for mild pain or fever.  Marland Kitchen amLODipine (NORVASC) 5 MG tablet Take 1 tablet by mouth 2 times per day  . carvedilol (COREG) 12.5 MG tablet Take 1 tablet (12.5 mg total) by mouth 2 (two) times daily with a  meal.  . Cholecalciferol (VITAMIN D3) 25 MCG (1000 UT) CAPS Take 1,000 Units by mouth daily.   Marland Kitchen doxazosin (CARDURA) 2 MG tablet Take 2 mg by mouth daily. (Patient not taking: Reported on 04/26/2020)  . doxazosin (CARDURA) 4 MG tablet Take 0.5 tablets (2 mg total) by mouth daily with supper.  . Empagliflozin (JARDIANCE PO) Take 25 mg by mouth once. 1 Tablet Daily with Breakfast  . fexofenadine (ALLEGRA) 180 MG tablet Take 180 mg by mouth daily as needed (allergies.).   Marland Kitchen insulin lispro protamine-lispro (HUMALOG 50/50 MIX) (50-50) 100 UNIT/ML SUSP injection Inject 22-25 Units into the skin See admin instructions. Inject 25 units subcutaneously with breakfast and 22 units with supper  . Insulin Syringe-Needle U-100 (EASY TOUCH INSULIN SYRINGE) 30G X 5/16" 0.5 ML MISC USE 3 TIMES DAILY  . LANTUS 100 UNIT/ML injection Inject into the skin.  Marland Kitchen losartan (COZAAR) 50 MG tablet   . losartan-hydrochlorothiazide (HYZAAR) 100-25 MG tablet TAKE 1 TABLET BY MOUTH  DAILY  . metFORMIN (GLUCOPHAGE-XR) 500 MG 24 hr tablet Take 1,000 mg by mouth 2 (two) times daily with a meal.   . NOVOLOG 100 UNIT/ML injection Inject into the skin.   No current facility-administered medications for this visit. (Other)      REVIEW OF SYSTEMS: ROS    Positive for: Endocrine,  Cardiovascular, Eyes   Negative for: Constitutional, Gastrointestinal, Neurological, Skin, Genitourinary, Musculoskeletal, HENT, Respiratory, Psychiatric, Allergic/Imm, Heme/Lymph   Last edited by Leonie Douglas, COA on 05/03/2020  8:54 AM. (History)       ALLERGIES Allergies  Allergen Reactions  . Bidil [Isosorb Dinitrate-Hydralazine] Other (See Comments)    Severe hypotension episode after administering one dose of Bidil 20-37.5 mg on 09/25/2019.   . Statins Other (See Comments)    Leg pain    PAST MEDICAL HISTORY Past Medical History:  Diagnosis Date  . Atrial fibrillation ([place])   . Atypical atrial flutter ([place]) 10/17/2018  . Diabetes mellitus  without complication ([place])   . Diabetic retinopathy ([place])    NPDR OU  . High cholesterol   . History of prostate cancer   . HTN (hypertension)   . Hypertensive retinopathy    OU  . Renal insufficiency   . Shingles    Past Surgical History:  Procedure Laterality Date  . ATRIAL TACH ABLATION N/A 05/08/2019   Procedure: ATRIAL TACH ABLATION;  Surgeon: [name], MD;  Location: [facility];  Service: Cardiovascular;  Laterality: N/A;  . CATARACT EXTRACTION Bilateral   . EYE SURGERY Bilateral    Cat Sx  . prostate implant      FAMILY HISTORY Family History  Problem Relation Age of Onset  . Cancer Mother   . Cancer Father     SOCIAL HISTORY Social History   Tobacco Use  . Smoking status: Never Smoker  . Smokeless tobacco: Never Used  Vaping Use  . Vaping Use: Never used  Substance Use Topics  . Alcohol use: No  . Drug use: No         OPHTHALMIC EXAM:  Base Eye Exam    Visual Acuity (Snellen - Linear)      Right Left   Dist [place] 20/200 +1 20/150 -1   Dist ph [place] 20/100 +1 20/70 +1       Tonometry (Tonopen, 9:04 AM)      Right Left   Pressure 13 18       Pupils      Dark Light Shape React APD   Right 3 2 Round Minimal None   Left 3 2 Round Brisk None       Visual Fields (Counting fingers)      Left Right    Full Full       Extraocular Movement      Right Left    Full Full       Neuro/Psych    Oriented x3: Yes   Mood/Affect: Normal       Dilation    Both eyes: 1.0% Mydriacyl, 2.5% Phenylephrine @ 9:04 AM        Slit Lamp and Fundus Exam    Slit Lamp Exam      Right Left   Lids/Lashes Dermatochalasis - upper lid, mild Meibomian gland dysfunction Dermatochalasis - upper lid, mild Meibomian gland dysfunction   Conjunctiva/Sclera Mild Melanosis Mild Melanosis   Cornea Trace Punctate epithelial erosions, 1+, Descemet's folds, well healed cataract wounds 1+ Punctate epithelial erosions. endopigment   Anterior Chamber Deep and quiet  Deep and quiet   Iris Round and dilated, No NVI Round and dilated, No NVI   Lens Toric PC IOL with marks at 1200 and 0600 PC IOL in good position with PC folds   Vitreous Vitreous syneresis, PVD and vitreous condensations, Triescence inferiorly Vitreous syneresis       Fundus  Exam      Right Left   Disc Mild Pallor, mild, temporal Peripapillary atrophy, Sharp rim temporal Peripapillary atrophy, +cupping, mild Pallor, Sharp rim   C/D Ratio 0.6 0.65   Macula Hazy view, blunted foveal reflex, +central IRH; pigment clumping, Microaneurysms, persistent central cystic changes -- slightly increased Blunted foveal reflex, Retinal pigment epithelial mottling and clumping, focal Exudates - improved, +IRH resolved, +central Cystic changes -- increased, scattered Microaneurysms   Vessels Vascular attenuation, Tortuous Vascular attenuation, mild Copper wiring, AV crossing changes   Periphery Attached, large HST at 0500 with surrounding SRF, +pigmented barrier laser scar from 0430-0600    Attached, small pigmented operculum at 0730 midzone--stable, no SRF             IMAGING AND PROCEDURES  Imaging and Procedures for @TODAY @  OCT, Retina - OU - Both Eyes       Right Eye Quality was borderline. Central Foveal Thickness: 514. Progression has worsened. Findings include abnormal foveal contour, intraretinal fluid, no SRF, intraretinal hyper-reflective material (Interval increase in IRF/IRHM; Trace ERM, +vitreous opacities).   Left Eye Quality was good. Central Foveal Thickness: 295. Progression has been stable. Findings include intraretinal fluid, abnormal foveal contour, intraretinal hyper-reflective material, no SRF, outer retinal atrophy (Mild ERM; mild interval increase in IRF/IRHM).   Notes *Images captured and stored on drive  Diagnosis / Impression:  OD: interval increase in IRF/IRHM; Trace ERM, +vitreous opacities OS: Mild ERM; interval increase in IRF/IRHM  Clinical management:  See  below  Abbreviations: NFP - Normal foveal profile. CME - cystoid macular edema. PED - pigment epithelial detachment. IRF - intraretinal fluid. SRF - subretinal fluid. EZ - ellipsoid zone. ERM - epiretinal membrane. ORA - outer retinal atrophy. ORT - outer retinal tubulation. SRHM - subretinal hyper-reflective material                 ASSESSMENT/PLAN:    ICD-10-CM   1. Moderate nonproliferative diabetic retinopathy of both eyes with macular edema associated with type 2 diabetes mellitus ([place])  W41.3244   2. Retinal edema  H35.81 OCT, Retina - OU - Both Eyes  3. History of retinal detachment  Z86.69   4. Essential hypertension  I10   5. Hypertensive retinopathy of both eyes  H35.033   6. Pseudophakia of both eyes  Z96.1    **Pt presents acutely today (1 wk post IVTA OD, 9.10.21) for decreased vision OD following injection  - BCVA OD down to 20/100 from 20/40  - exam shows intravitreal triamcinolone encroaching on central vision  - OCT shows interval increase in central IRF/edema  - discussed findings  - recommend keeping head elevated and sleeping with head elevated  - f/u in 1 wk  1,2.  Non-proliferative diabetic retinopathy w/ macular edema OU  - patient with long standing history of macular edema -- was seen by Dr. Zigmund Daniel back in 2013  - has also seen Dr. Baird Cancer, Dr. Posey Pronto, [place] service at [facility]  - last retina f/u in 2019 with Dr. Posey Pronto and at [facility]  - pt has previously received intravitreal injections OU, but does not like them and felt like benefits from injections were minmal  - at initial visit, was using PF and ketorolac QID OU -- pt self d/c  - exam shows scattered/minimal MA but central edema OU  - FA (12.11.20) shows late leaking MA, OS shows capillary nonperfusion, OD with ?petaloid central hyperfluorescence -- ?CME component  - s/p IVA# 1 OD (12.11.20)  -  s/p IVE #1 OD (01.20.21), #2 (02.17.21), #3 (03.17.21), #4 (05.13.21), #5 (06.11.21), #6  (07.14.21), #7 (08.13.21)  - s/p IVE# 1 OS (12.11.20), #2 (01.20.21), #3 (02.17.21), #4 (03.17.21), #5 (04.15.21), #6 (4.15.21), #7 (06.11.21), #8 (07.14.21), #9 (08.13.21), #10 (9.10.21)             - s/p IVTA OD #1 (04.15.21), #2 (9.10.21)  - s/p IVTA OS #1 (05.13.21)  - good response to IVTA previously  - BCVA OD: 20/200+1 PH 20/100+1 (worse); OS: 20/70 (improved)  - OCT shows interval increase in IRF/IRHM OU  - [employer] benefits investigation initiated 12.11.20 -- approved for 2021  [facility] informed consent form signed and scanned on 01.20.21  - IVTA informed consent form signed and scanned on 04.15.21  - cont PF QID OU and Prolensa QID OU  - f/u in 4 wks -- DFE/OCT/possible injection **will go back to IVTA if IVE fails to improve IRF/DME**  3. History of inferior HST w/ focal RD OD s/p laser retinopexy  - HST at 0500 w/ surrounding SRF  - good barricade laser in place spanning 0430-0600  - stable  4,5. Hypertensive retinopathy OU  - discussed importance of tight BP control  - monitor   6. Pseudophakia OU  - s/p CE/IOL OU  - beautiful surgeries, doing well  - monitor   Ophthalmic Meds Ordered this visit:  No orders of the defined types were placed in this encounter.      Return in about 1 week (around 05/10/2020) for 1 week follow up OD, DFE, OCT, Refraction.  There are no Patient Instructions on file for this visit.   Explained the diagnoses, plan, and follow up with the patient and they expressed understanding.  Patient expressed understanding of the importance of proper follow up care.   This document serves as a record of services personally performed by Gardiner Sleeper, MD, PhD. It was created on their behalf by Leeann Must, [place], an ophthalmic technician. The creation of this record is the provider's dictation and/or activities during the visit.    Electronically signed by: Leeann Must, COA @TODAY @ 9:50 PM  Gardiner Sleeper, M.D., Ph.D. Diseases & Surgery of  the Retina and Vitreous [facility]  I have reviewed the above documentation for accuracy and completeness, and I agree with the above. Gardiner Sleeper, M.D., Ph.D. 05/04/20 9:50 PM   Abbreviations: M myopia (nearsighted); A astigmatism; H hyperopia (farsighted); P presbyopia; Mrx spectacle prescription;  CTL contact lenses; OD right eye; OS left eye; OU both eyes  XT exotropia; ET esotropia; PEK punctate epithelial keratitis; PEE punctate epithelial erosions; DES dry eye syndrome; MGD meibomian gland dysfunction; ATs artificial tears; PFAT's preservative free artificial tears; [place] nuclear sclerotic cataract; PSC posterior subcapsular cataract; ERM epi-retinal membrane; PVD posterior vitreous detachment; RD retinal detachment; DM diabetes mellitus; DR diabetic retinopathy; NPDR non-proliferative diabetic retinopathy; PDR proliferative diabetic retinopathy; CSME clinically significant macular edema; DME diabetic macular edema; dbh dot blot hemorrhages; CWS cotton wool spot; POAG primary open angle glaucoma; C/D cup-to-disc ratio; HVF humphrey visual field; GVF goldmann visual field; OCT optical coherence tomography; IOP intraocular pressure; BRVO Branch retinal vein occlusion; CRVO central retinal vein occlusion; CRAO central retinal artery occlusion; BRAO branch retinal artery occlusion; RT retinal tear; SB scleral buckle; PPV pars plana vitrectomy; VH Vitreous hemorrhage; PRP panretinal laser photocoagulation; IVK intravitreal kenalog; VMT vitreomacular traction; MH Macular hole;  NVD neovascularization of the disc; NVE neovascularization elsewhere; AREDS age related eye disease  study; ARMD age related macular degeneration; POAG primary open angle glaucoma; EBMD epithelial/anterior basement membrane dystrophy; ACIOL anterior chamber intraocular lens; IOL intraocular lens; PCIOL posterior chamber intraocular lens; Phaco/IOL phacoemulsification with intraocular lens placement; [place]  photorefractive keratectomy; LASIK laser assisted in situ keratomileusis; HTN hypertension; DM diabetes mellitus; COPD chronic obstructive pulmonary disease

## 2020-05-03 ENCOUNTER — Other Ambulatory Visit: Payer: Self-pay

## 2020-05-03 ENCOUNTER — Ambulatory Visit (INDEPENDENT_AMBULATORY_CARE_PROVIDER_SITE_OTHER): Payer: Medicare Other | Admitting: Ophthalmology

## 2020-05-03 ENCOUNTER — Encounter (INDEPENDENT_AMBULATORY_CARE_PROVIDER_SITE_OTHER): Payer: Self-pay | Admitting: Ophthalmology

## 2020-05-03 DIAGNOSIS — H35033 Hypertensive retinopathy, bilateral: Secondary | ICD-10-CM | POA: Diagnosis not present

## 2020-05-03 DIAGNOSIS — E113313 Type 2 diabetes mellitus with moderate nonproliferative diabetic retinopathy with macular edema, bilateral: Secondary | ICD-10-CM

## 2020-05-03 DIAGNOSIS — Z8669 Personal history of other diseases of the nervous system and sense organs: Secondary | ICD-10-CM | POA: Diagnosis not present

## 2020-05-03 DIAGNOSIS — H3581 Retinal edema: Secondary | ICD-10-CM

## 2020-05-03 DIAGNOSIS — I10 Essential (primary) hypertension: Secondary | ICD-10-CM

## 2020-05-03 DIAGNOSIS — Z961 Presence of intraocular lens: Secondary | ICD-10-CM

## 2020-05-06 ENCOUNTER — Encounter: Payer: Self-pay | Admitting: Cardiology

## 2020-05-06 NOTE — Progress Notes (Signed)
[facility] Note  05/10/2020     CHIEF COMPLAINT Patient presents for Retina Follow Up   HISTORY OF PRESENT ILLNESS: Marcus Johnston is a 86 y.o. male who presents to the clinic today for:   HPI    Retina Follow Up    Patient presents with  Other.  In right eye.  This started 1 week ago.  I, the attending physician,  performed the HPI with the patient and updated documentation appropriately.          Comments    Patient here for 1 week retina follow up for decrease vision s/p IVTA (04-26-20) Patient states vision having problem OD. Suffering from last treatment had in OD. Problem seeing. Injection caused cloudiness and debrie in eye. No eye pain now. OS is the same no change.        Last edited by [name], MD on 05/12/2020 12:50 AM. (History)    Patient states his floaters are "slowly" disappearing, he states the big floaters are becoming smaller and he still has a coating on his eye that is making his vision blurry, he states he has no depth perception  Referring physician: [name], MD [address] [place],  [place] [address]  HISTORICAL INFORMATION:   Selected notes from the MEDICAL RECORD NUMBER Referred by Dr. [name] for CME OU   CURRENT MEDICATIONS: Current Outpatient Medications (Ophthalmic Drugs)  Medication Sig  . Bromfenac Sodium (PROLENSA) 0.07 % SOLN Place 1 drop into both eyes 4 (four) times daily.  . prednisoLONE acetate (PRED FORTE) 1 % ophthalmic suspension Place 1 drop into both eyes 4 (four) times daily.   No current facility-administered medications for this visit. (Ophthalmic Drugs)   Current Outpatient Medications (Other)  Medication Sig  . acetaminophen (TYLENOL) 325 MG tablet Take 650 mg by mouth every 6 (six) hours as needed for mild pain or fever.  Marland Kitchen amLODipine (NORVASC) 5 MG tablet Take 1 tablet by mouth 2 times per day  . carvedilol (COREG) 12.5 MG tablet Take 1 tablet (12.5 mg total) by  mouth 2 (two) times daily with a meal.  . Cholecalciferol (VITAMIN D3) 25 MCG (1000 UT) CAPS Take 1,000 Units by mouth daily.   Marland Kitchen doxazosin (CARDURA) 2 MG tablet Take 2 mg by mouth daily. (Patient not taking: Reported on 04/26/2020)  . doxazosin (CARDURA) 4 MG tablet TAKE 1 TABLET BY MOUTH AT  BEDTIME  . Empagliflozin (JARDIANCE PO) Take 25 mg by mouth once. 1 Tablet Daily with Breakfast  . fexofenadine (ALLEGRA) 180 MG tablet Take 180 mg by mouth daily as needed (allergies.).   Marland Kitchen insulin lispro protamine-lispro (HUMALOG 50/50 MIX) (50-50) 100 UNIT/ML SUSP injection Inject 22-25 Units into the skin See admin instructions. Inject 25 units subcutaneously with breakfast and 22 units with supper  . Insulin Syringe-Needle U-100 (EASY TOUCH INSULIN SYRINGE) 30G X 5/16" 0.5 ML MISC USE 3 TIMES DAILY  . LANTUS 100 UNIT/ML injection Inject into the skin.  Marland Kitchen losartan (COZAAR) 50 MG tablet   . losartan-hydrochlorothiazide (HYZAAR) 100-25 MG tablet TAKE 1 TABLET BY MOUTH  DAILY  . metFORMIN (GLUCOPHAGE-XR) 500 MG 24 hr tablet Take 1,000 mg by mouth 2 (two) times daily with a meal.   . NOVOLOG 100 UNIT/ML injection Inject into the skin.   No current facility-administered medications for this visit. (Other)      REVIEW OF SYSTEMS: ROS    Positive for: Endocrine, Cardiovascular, Eyes  Negative for: Constitutional, Gastrointestinal, Neurological, Skin, Genitourinary, Musculoskeletal, HENT, Respiratory, Psychiatric, Allergic/Imm, Heme/Lymph   Last edited by Theodore Demark, COA on 05/10/2020  9:57 AM. (History)       ALLERGIES Allergies  Allergen Reactions  . Bidil [Isosorb Dinitrate-Hydralazine] Other (See Comments)    Severe hypotension episode after administering one dose of Bidil 20-37.5 mg on 09/25/2019.   . Statins Other (See Comments)    Leg pain    PAST MEDICAL HISTORY Past Medical History:  Diagnosis Date  . Atrial fibrillation ([place])   . Atypical atrial flutter ([place]) 10/17/2018  .  Diabetes mellitus without complication ([place])   . Diabetic retinopathy ([place])    NPDR OU  . High cholesterol   . History of prostate cancer   . HTN (hypertension)   . Hypertensive retinopathy    OU  . Renal insufficiency   . Shingles    Past Surgical History:  Procedure Laterality Date  . ATRIAL TACH ABLATION N/A 05/08/2019   Procedure: ATRIAL TACH ABLATION;  Surgeon: [name], MD;  Location: [facility];  Service: Cardiovascular;  Laterality: N/A;  . CATARACT EXTRACTION Bilateral   . EYE SURGERY Bilateral    Cat Sx  . prostate implant      FAMILY HISTORY Family History  Problem Relation Age of Onset  . Cancer Mother   . Cancer Father     SOCIAL HISTORY Social History   Tobacco Use  . Smoking status: Never Smoker  . Smokeless tobacco: Never Used  Vaping Use  . Vaping Use: Never used  Substance Use Topics  . Alcohol use: No  . Drug use: No         OPHTHALMIC EXAM:  Base Eye Exam    Visual Acuity (Snellen - Linear)      Right Left   Dist cc 20/80 -1 20/80 -2   Dist ph cc 20/70 -2 20/50 -1   Correction: Glasses       Tonometry (Tonopen, 9:51 AM)      Right Left   Pressure 11 12       Pupils      Dark Light Shape React APD   Right 3 2 Round Minimal None   Left 3 2 Round Brisk None       Visual Fields (Counting fingers)      Left Right    Full Full       Extraocular Movement      Right Left    Full, Ortho Full, Ortho       Neuro/Psych    Oriented x3: Yes   Mood/Affect: Normal       Dilation    Both eyes: 1.0% Mydriacyl, 2.5% Phenylephrine @ 9:51 AM        Slit Lamp and Fundus Exam    Slit Lamp Exam      Right Left   Lids/Lashes Dermatochalasis - upper lid, mild Meibomian gland dysfunction Dermatochalasis - upper lid, mild Meibomian gland dysfunction   Conjunctiva/Sclera Mild Melanosis Mild Melanosis   Cornea Trace Punctate epithelial erosions, 1+, Descemet's folds, well healed cataract wounds 1+ Punctate epithelial  erosions. endopigment   Anterior Chamber Deep and quiet Deep and quiet   Iris Round and dilated, No NVI Round and dilated, No NVI   Lens Toric PC IOL with marks at 1200 and 0600 PC IOL in good position with PC folds   Vitreous Vitreous syneresis, PVD and vitreous condensations, Triescence inferiorly -- dissolving Vitreous syneresis  Fundus Exam      Right Left   Disc Mild Pallor, mild, temporal Peripapillary atrophy, Sharp rim temporal Peripapillary atrophy, +cupping, mild Pallor, Sharp rim   C/D Ratio 0.6 0.65   Macula Hazy view improved, blunted foveal reflex, +central IRH; pigment clumping, Microaneurysms, persistent central cystic changes -- slightly improved Blunted foveal reflex, Retinal pigment epithelial mottling and clumping, focal Exudates - improved, +IRH resolved, +central Cystic changes -- increased, scattered Microaneurysms   Vessels Vascular attenuation, Tortuous Vascular attenuation, mild Copper wiring, AV crossing changes   Periphery Attached, large HST at 0500 with surrounding SRF, +pigmented barrier laser scar from 0430-0600    Attached, small pigmented operculum at 0730 midzone--stable, no SRF           Refraction    Wearing Rx      Sphere Cylinder Axis Add   Right -2.25 +0.75 143 +2.25   Left -3.50 +2.00 090 +2.25          IMAGING AND PROCEDURES  Imaging and Procedures for @TODAY @  OCT, Retina - OU - Both Eyes       Right Eye Quality was borderline. Central Foveal Thickness: 514. Progression has improved. Findings include abnormal foveal contour, intraretinal fluid, no SRF, intraretinal hyper-reflective material (Mild Interval improvement in cystic changes/IRF/vitreous opacities).   Left Eye Quality was good. Central Foveal Thickness: 295. Progression has been stable. Findings include intraretinal fluid, abnormal foveal contour, intraretinal hyper-reflective material, no SRF, outer retinal atrophy (Mild ERM; persistent IRF/IRHM).   Notes *Images  captured and stored on drive  Diagnosis / Impression:  OD: Mild Interval improvement in cystic changes/IRF/vitreous opacities OS: Mild ERM; persistent IRF/IRHM  Clinical management:  See below  Abbreviations: NFP - Normal foveal profile. CME - cystoid macular edema. PED - pigment epithelial detachment. IRF - intraretinal fluid. SRF - subretinal fluid. EZ - ellipsoid zone. ERM - epiretinal membrane. ORA - outer retinal atrophy. ORT - outer retinal tubulation. SRHM - subretinal hyper-reflective material                 ASSESSMENT/PLAN:    ICD-10-CM   1. Moderate nonproliferative diabetic retinopathy of both eyes with macular edema associated with type 2 diabetes mellitus ([place])  M19.6222   2. Retinal edema  H35.81 OCT, Retina - OU - Both Eyes  3. History of retinal detachment  Z86.69   4. Essential hypertension  I10   5. Hypertensive retinopathy of both eyes  H35.033   6. Pseudophakia of both eyes  Z96.1    **Pt presents today for f/u to decreased vision OD following injection on 09.10.21  - BCVA OD improved to 20/70 from 20/100  - exam shows intravitreal triamcinolone dissolving and settling inferiorly  - OCT shows interval improvement in cystic changes/IRF  - discussed findings  - recommend keeping head elevated and sleeping with head elevated  - f/u in week of October 11 for possible injections  1,2.  Non-proliferative diabetic retinopathy w/ macular edema OU  - patient with long standing history of macular edema -- was seen by Dr. Zigmund Daniel back in 2013  - has also seen Dr. Baird Cancer, Dr. Posey Pronto, [facility] service at [facility]  - last retina f/u in 2019 with Dr. Posey Pronto and at [facility]  - pt has previously received intravitreal injections OU, but does not like them and felt like benefits from injections were minmal  - at initial visit, was using PF and ketorolac QID OU -- pt self d/c  - exam shows scattered/minimal MA  but central edema OU  - FA (12.11.20) shows late leaking MA,  OS shows capillary nonperfusion, OD with ?petaloid central hyperfluorescence -- ?CME component  - s/p IVA# 1 OD (12.11.20)  - s/p IVE #1 OD (01.20.21), #2 (02.17.21), #3 (03.17.21), #4 (05.13.21), #5 (06.11.21), #6 (07.14.21), #7 (08.13.21)  - s/p IVE# 1 OS (12.11.20), #2 (01.20.21), #3 (02.17.21), #4 (03.17.21), #5 (04.15.21), #6 (4.15.21), #7 (06.11.21), #8 (07.14.21), #9 (08.13.21), #10 (9.10.21)             - s/p IVTA OD #1 (04.15.21), #2 (9.10.21)  - s/p IVTA OS #1 (05.13.21)  - good response to IVTA previously  - BCVA OD: 20/70 (improved from prior, but not back to baseline); OS: 20/50 (improved)  - OCT shows OD: Mild Interval improvement in cystic changes/IRF/vitreous opacities; OS: persistent IRF/IRHM  - Eylea[employer]U benefits investigation initiated 12.11.20 -- approved for 2021  - Eylea informed consent form signed and scanned on 01.20.21  - IVTA informed consent form signed and scanned on 04.15.21  - cont PF QID OU and Prolensa QID OU  - f/u week of October 11 -- DFE/OCT/possible injection 3. History of inferior HST w/ focal RD OD s/p laser retinopexy  - HST at 0500 w/ surrounding SRF  - good barricade laser in place spanning 0430-0600  - stable  4,5. Hypertensive retinopathy OU  - discussed importance of tight BP control  - monitor   6. Pseudophakia OU  - s/p CE/IOL OU  - beautiful surgeries, doing well  - monitor   Ophthalmic Meds Ordered this visit:  No orders of the defined types were placed in this encounter.      Return for f/u week of October 11 NPDR OU, DFE, OCT.  There are no Patient Instructions on file for this visit.   Explained the diagnoses, plan, and follow up with the patient and they expressed understanding.  Patient expressed understanding of the importance of proper follow up care.   This document serves as a record of services personally performed by Gardiner Sleeper, MD, PhD. It was created on their behalf by San Jetty. Owens Shark, OA an ophthalmic  technician. The creation of this record is the provider's dictation and/or activities during the visit.    Electronically signed by: San Jetty. Owens Shark, [place] 09.20.2021 12:51 AM  Gardiner Sleeper, M.D., Ph.D. Diseases & Surgery of the Retina and Vitreous [facility]  I have reviewed the above documentation for accuracy and completeness, and I agree with the above. Gardiner Sleeper, M.D., Ph.D. 05/12/20 12:58 AM   Abbreviations: M myopia (nearsighted); A astigmatism; H hyperopia (farsighted); P presbyopia; Mrx spectacle prescription;  CTL contact lenses; OD right eye; OS left eye; OU both eyes  XT exotropia; ET esotropia; PEK punctate epithelial keratitis; PEE punctate epithelial erosions; DES dry eye syndrome; MGD meibomian gland dysfunction; ATs artificial tears; PFAT's preservative free artificial tears; [place] nuclear sclerotic cataract; PSC posterior subcapsular cataract; ERM epi-retinal membrane; PVD posterior vitreous detachment; RD retinal detachment; DM diabetes mellitus; DR diabetic retinopathy; NPDR non-proliferative diabetic retinopathy; PDR proliferative diabetic retinopathy; CSME clinically significant macular edema; DME diabetic macular edema; dbh dot blot hemorrhages; CWS cotton wool spot; POAG primary open angle glaucoma; C/D cup-to-disc ratio; HVF humphrey visual field; GVF goldmann visual field; OCT optical coherence tomography; IOP intraocular pressure; BRVO Branch retinal vein occlusion; CRVO central retinal vein occlusion; CRAO central retinal artery occlusion; BRAO branch retinal artery occlusion; RT retinal tear; SB scleral buckle; PPV pars plana vitrectomy;  VH Vitreous hemorrhage; PRP panretinal laser photocoagulation; IVK intravitreal kenalog; VMT vitreomacular traction; MH Macular hole;  NVD neovascularization of the disc; NVE neovascularization elsewhere; AREDS age related eye disease study; ARMD age related macular degeneration; POAG primary open angle glaucoma;  EBMD epithelial/anterior basement membrane dystrophy; ACIOL anterior chamber intraocular lens; IOL intraocular lens; PCIOL posterior chamber intraocular lens; Phaco/IOL phacoemulsification with intraocular lens placement; [place] photorefractive keratectomy; LASIK laser assisted in situ keratomileusis; HTN hypertension; DM diabetes mellitus; COPD chronic obstructive pulmonary disease

## 2020-05-08 ENCOUNTER — Other Ambulatory Visit: Payer: Self-pay | Admitting: Internal Medicine

## 2020-05-10 ENCOUNTER — Ambulatory Visit (INDEPENDENT_AMBULATORY_CARE_PROVIDER_SITE_OTHER): Payer: Medicare Other | Admitting: Ophthalmology

## 2020-05-10 ENCOUNTER — Encounter (INDEPENDENT_AMBULATORY_CARE_PROVIDER_SITE_OTHER): Payer: Self-pay | Admitting: Ophthalmology

## 2020-05-10 DIAGNOSIS — Z8669 Personal history of other diseases of the nervous system and sense organs: Secondary | ICD-10-CM | POA: Diagnosis not present

## 2020-05-10 DIAGNOSIS — I10 Essential (primary) hypertension: Secondary | ICD-10-CM | POA: Diagnosis not present

## 2020-05-10 DIAGNOSIS — H35033 Hypertensive retinopathy, bilateral: Secondary | ICD-10-CM | POA: Diagnosis not present

## 2020-05-10 DIAGNOSIS — H3581 Retinal edema: Secondary | ICD-10-CM

## 2020-05-10 DIAGNOSIS — Z961 Presence of intraocular lens: Secondary | ICD-10-CM

## 2020-05-10 DIAGNOSIS — E113313 Type 2 diabetes mellitus with moderate nonproliferative diabetic retinopathy with macular edema, bilateral: Secondary | ICD-10-CM

## 2020-05-12 ENCOUNTER — Encounter (INDEPENDENT_AMBULATORY_CARE_PROVIDER_SITE_OTHER): Payer: Self-pay | Admitting: Ophthalmology

## 2020-05-13 ENCOUNTER — Telehealth: Payer: Self-pay

## 2020-05-13 NOTE — Chronic Care Management (AMB) (Addendum)
Chronic Care Management Pharmacy Assistant   Name: Marcus Johnston  MRN: [id number] DOB: October 31, 1930  Reason for Encounter: Disease State - Diabetes and Hypertension.  Patient Questions:  1.  Have you seen any other providers since your last visit? Yes.  04/04/2020- Valarie Merino -  PCP 03/29/2020- [name] , MD - Ophthalmology 04/26/2020- [name] , MD - Ophthalmology 04/26/2020- [name], MD - Ophthalmology   2.  Any changes in your medicines or health? No     PCP : Glendale Chard, MD  Allergies:   Allergies  Allergen Reactions   Bidil [Isosorb Dinitrate-Hydralazine] Other (See Comments)    Severe hypotension episode after administering one dose of Bidil 20-37.5 mg on 09/25/2019.    Statins Other (See Comments)    Leg pain    Medications: Outpatient Encounter Medications as of 05/13/2020  Medication Sig   acetaminophen (TYLENOL) 325 MG tablet Take 650 mg by mouth every 6 (six) hours as needed for mild pain or fever.   amLODipine (NORVASC) 5 MG tablet Take 1 tablet by mouth 2 times per day   Bromfenac Sodium (PROLENSA) 0.07 % SOLN Place 1 drop into both eyes 4 (four) times daily.   carvedilol (COREG) 12.5 MG tablet Take 1 tablet (12.5 mg total) by mouth 2 (two) times daily with a meal.   Cholecalciferol (VITAMIN D3) 25 MCG (1000 UT) CAPS Take 1,000 Units by mouth daily.    doxazosin (CARDURA) 2 MG tablet Take 2 mg by mouth daily. (Patient not taking: Reported on 04/26/2020)   doxazosin (CARDURA) 4 MG tablet TAKE 1 TABLET BY MOUTH AT  BEDTIME   Empagliflozin (JARDIANCE PO) Take 25 mg by mouth once. 1 Tablet Daily with Breakfast   fexofenadine (ALLEGRA) 180 MG tablet Take 180 mg by mouth daily as needed (allergies.).    insulin lispro protamine-lispro (HUMALOG 50/50 MIX) (50-50) 100 UNIT/ML SUSP injection Inject 22-25 Units into the skin See admin instructions. Inject 25 units subcutaneously with breakfast and 22 units with supper   Insulin Syringe-Needle U-100  (EASY TOUCH INSULIN SYRINGE) 30G X 5/16" 0.5 ML MISC USE 3 TIMES DAILY   LANTUS 100 UNIT/ML injection Inject into the skin.   losartan (COZAAR) 50 MG tablet    losartan-hydrochlorothiazide (HYZAAR) 100-25 MG tablet TAKE 1 TABLET BY MOUTH  DAILY   metFORMIN (GLUCOPHAGE-XR) 500 MG 24 hr tablet Take 1,000 mg by mouth 2 (two) times daily with a meal.    NOVOLOG 100 UNIT/ML injection Inject into the skin.   prednisoLONE acetate (PRED FORTE) 1 % ophthalmic suspension Place 1 drop into both eyes 4 (four) times daily.   No facility-administered encounter medications on file as of 05/13/2020.    Current Diagnosis: Patient Active Problem List   Diagnosis Date Noted   Pure hypercholesterolemia 12/26/2019   Malignant HTN with heart disease, w/o CHF, with chronic kidney disease 12/19/2019   Hypertensive crisis 12/18/2019   Diabetic retinopathy ([place])    Hypotension due to drugs 09/26/2019   Groin swelling 05/17/2019   Bruit (arterial) 05/17/2019   SVT (supraventricular tachycardia) ([place]) 05/08/2019   Atrial tachycardia ([place]) 05/02/2019   Bradycardia 04/30/2019   Syncope 04/30/2019   Sick sinus syndrome ([place]) 04/30/2019   Syncope and collapse 04/29/2019   Leg edema 10/17/2018   Type 2 diabetes mellitus with other circulatory complications ([place]) 91/63/8466   Hyperglycemia    Essential hypertension 11/12/2016   Dyslipidemia 11/12/2016   DM (diabetes mellitus) ([place]) 11/12/2016   Renal insufficiency 11/12/2016  Recent Relevant Labs: Lab Results  Component Value Date/Time   HGBA1C 7.7 (H) 04/04/2020 10:02 AM   HGBA1C 8.0 (H) 12/18/2019 10:34 AM   MICROALBUR 80 04/04/2020 09:17 AM   MICROALBUR 150 03/23/2019 04:38 PM    Kidney Function Lab Results  Component Value Date/Time   CREATININE 1.44 (H) 04/04/2020 10:04 AM   CREATININE 1.08 12/20/2019 07:50 AM   GFRNONAA 44 (L) 04/04/2020 10:04 AM   GFRAA 50 (L) 04/04/2020 10:04 AM    Current antihyperglycemic regimen:  Humalog 50/50 inject  22-25 units twice a day Metformin XR 500 mg take 1,000 mg two times a day.  What recent interventions/DTPs have been made to improve glycemic control:  Patient states he has been dieting and exercising as directed by PCP.  Have there been any recent hospitalizations or ED visits since last visit with CPP? No   Patient denies hypoglycemic symptoms, including Pale, Sweaty, Shaky, Hungry and Nervous/irritable Patient denies hyperglycemic symptoms, including blurry vision, excessive thirst, fatigue, polyuria and weakness   How often are you checking your blood sugar? Patient states he takes his blood sugar as directed, states that he will discuss at next visit , states that since 05/06/2020 his numbers have been 113 - 121 . What are your blood sugars ranging?  Fasting: 05/13/2020 - 120 Fasting: 05/12/2020 - 121  Before meals: none After meals: none  Bedtime: None During the week, how often does your blood glucose drop below 70? Never   Adherence Review: Is the patient currently on a STATIN medication? No Is the patient currently on ACE/ARB medication? Yes Losartan/HCTZ 100/25mg  daily Does the patient have >5 day gap between last estimated fill dates? No  Reviewed chart prior to disease state call. Spoke with patient regarding BP  Recent Office Vitals: BP Readings from Last 3 Encounters:  04/04/20 (!) 152/80  04/04/20 (!) 152/80  03/05/20 (!) 146/74   Pulse Readings from Last 3 Encounters:  04/04/20 73  04/04/20 73  03/05/20 72    Wt Readings from Last 3 Encounters:  04/04/20 230 lb (104.3 kg)  04/04/20 230 lb 3.2 oz (104.4 kg)  03/05/20 226 lb (102.5 kg)     Kidney Function Lab Results  Component Value Date/Time   CREATININE 1.44 (H) 04/04/2020 10:04 AM   CREATININE 1.08 12/20/2019 07:50 AM   GFRNONAA 44 (L) 04/04/2020 10:04 AM   GFRAA 50 (L) 04/04/2020 10:04 AM    BMP Latest Ref Rng & Units 04/04/2020 12/20/2019 12/19/2019  Glucose 65 - 99 mg/dL 135(H) 273(H) 329(H)    BUN 8 - 27 mg/dL 26 16 18   Creatinine 0.76 - 1.27 mg/dL 1.44(H) 1.08 1.17  BUN/Creat Ratio 10 - 24 18 - -  Sodium 134 - 144 mmol/L 140 137 134(L)  Potassium 3.5 - 5.2 mmol/L 4.5 3.8 4.0  Chloride 96 - 106 mmol/L 102 101 103  CO2 20 - 29 mmol/L 24 25 22   Calcium 8.6 - 10.2 mg/dL 9.8 9.3 9.0    Current antihypertensive regimen:  Losartan- Hydrochlorothiazide 100-25 mg daily Doxazosin 2 mg daily Amlodipine 5 mg twice a day Carvedilol 12.5 twice daily with meal How often are you checking your Blood Pressure? Patient states he has been taking blood pressure at home as directed. Current home BP readings: 126/70  What recent interventions/DTPs have been made by any provider to improve Blood Pressure control since last CPP Visit: Patient states that he is dieting as directed by provider.  Patient states he has been very active out side.  Patient wants to discuss at next visit .  Any recent hospitalizations or ED visits since last visit with CPP? No   What diet changes have been made to improve Blood Pressure Control?  Patient states that he is dieting as directed by provider.   What exercise is being done to improve your Blood Pressure Control?  Patient states he has been outside working in yard for 1.5 hours this morning and he states that he stays active.  Adherence Review: Is the patient currently on ACE/ARB medication? Yes. Losartan/HCTZ 100/25mg  daily Does the patient have >5 day gap between last estimated fill dates? No     Goals Addressed   None     Follow-Up:  Pharmacist Review - Patient states that he is at goal with his medications, diet and exercise , he said he will discuss at next appointment.

## 2020-05-21 NOTE — Progress Notes (Signed)
[facility] Note  05/24/2020     CHIEF COMPLAINT Patient presents for Retina Follow Up   HISTORY OF PRESENT ILLNESS: Marcus Johnston is a 86 y.o. male who presents to the clinic today for:   HPI    Retina Follow Up    Patient presents with  Other.  In both eyes.  This started 4 weeks ago.  I, the attending physician,  performed the HPI with the patient and updated documentation appropriately.          Comments    Patient here for 4 weeks retina follow up for NPDR OU. Patient states vision been struggling. Thinks has had some damage to eye. Like de brie since last injection. Seems to has a film less light coming through. Trouble with detail. No eye pain. Has had bad pain after last injection but not since.       Last edited by [name], MD on 05/24/2020 11:47 AM. (History)    Patient states he still has floaters, but they are slowly getting better, he states he has a "string" in his vision and the color white looks tan, he states he has not been able to read any text since he had the steroid injection   Referring physician: Glendale Chard, MD [address] [place],  [place] [address]  HISTORICAL INFORMATION:   Selected notes from the [facility] Referred by Dr. [name] for CME OU   CURRENT MEDICATIONS: Current Outpatient Medications (Ophthalmic Drugs)  Medication Sig  . Bromfenac Sodium (PROLENSA) 0.07 % SOLN Place 1 drop into both eyes 4 (four) times daily.  . prednisoLONE acetate (PRED FORTE) 1 % ophthalmic suspension Place 1 drop into both eyes 4 (four) times daily.   No current facility-administered medications for this visit. (Ophthalmic Drugs)   Current Outpatient Medications (Other)  Medication Sig  . acetaminophen (TYLENOL) 325 MG tablet Take 650 mg by mouth every 6 (six) hours as needed for mild pain or fever.  Marland Kitchen amLODipine (NORVASC) 5 MG tablet Take 1 tablet by mouth 2 times per day  . carvedilol  (COREG) 12.5 MG tablet Take 1 tablet (12.5 mg total) by mouth 2 (two) times daily with a meal.  . Cholecalciferol (VITAMIN D3) 25 MCG (1000 UT) CAPS Take 1,000 Units by mouth daily.   Marland Kitchen doxazosin (CARDURA) 2 MG tablet Take 2 mg by mouth daily. (Patient not taking: Reported on 04/26/2020)  . doxazosin (CARDURA) 4 MG tablet TAKE 1 TABLET BY MOUTH AT  BEDTIME  . Empagliflozin (JARDIANCE PO) Take 25 mg by mouth once. 1 Tablet Daily with Breakfast  . fexofenadine (ALLEGRA) 180 MG tablet Take 180 mg by mouth daily as needed (allergies.).   Marland Kitchen insulin lispro protamine-lispro (HUMALOG 50/50 MIX) (50-50) 100 UNIT/ML SUSP injection Inject 22-25 Units into the skin See admin instructions. Inject 25 units subcutaneously with breakfast and 22 units with supper  . Insulin Syringe-Needle U-100 (EASY TOUCH INSULIN SYRINGE) 30G X 5/16" 0.5 ML MISC USE 3 TIMES DAILY  . LANTUS 100 UNIT/ML injection Inject into the skin.  Marland Kitchen losartan (COZAAR) 50 MG tablet   . losartan-hydrochlorothiazide (HYZAAR) 100-25 MG tablet TAKE 1 TABLET BY MOUTH  DAILY  . metFORMIN (GLUCOPHAGE-XR) 500 MG 24 hr tablet Take 1,000 mg by mouth 2 (two) times daily with a meal.   . NOVOLOG 100 UNIT/ML injection Inject into the skin.   No current facility-administered medications for this visit. (Other)  REVIEW OF SYSTEMS: ROS    Positive for: Endocrine, Cardiovascular, Eyes   Negative for: Constitutional, Gastrointestinal, Neurological, Skin, Genitourinary, Musculoskeletal, HENT, Respiratory, Psychiatric, Allergic/Imm, Heme/Lymph   Last edited by Theodore Demark, COA on 05/24/2020 10:22 AM. (History)       ALLERGIES Allergies  Allergen Reactions  . Bidil [Isosorb Dinitrate-Hydralazine] Other (See Comments)    Severe hypotension episode after administering one dose of Bidil 20-37.5 mg on 09/25/2019.   . Statins Other (See Comments)    Leg pain    PAST MEDICAL HISTORY Past Medical History:  Diagnosis Date  . Atrial fibrillation  ([place])   . Atypical atrial flutter ([place]) 10/17/2018  . Diabetes mellitus without complication ([place])   . Diabetic retinopathy ([place])    NPDR OU  . High cholesterol   . History of prostate cancer   . HTN (hypertension)   . Hypertensive retinopathy    OU  . Renal insufficiency   . Shingles    Past Surgical History:  Procedure Laterality Date  . ATRIAL TACH ABLATION N/A 05/08/2019   Procedure: ATRIAL TACH ABLATION;  Surgeon: [name], MD;  Location: [facility];  Service: Cardiovascular;  Laterality: N/A;  . CATARACT EXTRACTION Bilateral   . EYE SURGERY Bilateral    Cat Sx  . prostate implant      FAMILY HISTORY Family History  Problem Relation Age of Onset  . Cancer Mother   . Cancer Father     SOCIAL HISTORY Social History   Tobacco Use  . Smoking status: Never Smoker  . Smokeless tobacco: Never Used  Vaping Use  . Vaping Use: Never used  Substance Use Topics  . Alcohol use: No  . Drug use: No         OPHTHALMIC EXAM:  Base Eye Exam    Visual Acuity (Snellen - Linear)      Right Left   Dist cc 20/70 +2 20/70 -1   Dist ph cc 20/40 20/60 +2   Correction: Glasses       Tonometry (Tonopen, 10:17 AM)      Right Left   Pressure 13 12       Pupils      Dark Light Shape React APD   Right 3 2 Round Minimal None   Left 3 2 Round Brisk None       Visual Fields (Counting fingers)      Left Right    Full Full       Extraocular Movement      Right Left    Full, Ortho Full, Ortho       Neuro/Psych    Oriented x3: Yes   Mood/Affect: Normal       Dilation    Both eyes: 1.0% Mydriacyl, 2.5% Phenylephrine @ 10:17 AM        Slit Lamp and Fundus Exam    Slit Lamp Exam      Right Left   Lids/Lashes Dermatochalasis - upper lid, mild Meibomian gland dysfunction Dermatochalasis - upper lid, mild Meibomian gland dysfunction   Conjunctiva/Sclera Mild Melanosis Mild Melanosis   Cornea Trace Punctate epithelial erosions, 1+, Descemet's folds, well  healed cataract wounds 1+ Punctate epithelial erosions. endopigment   Anterior Chamber Deep and quiet Deep and quiet   Iris Round and dilated, No NVI Round and dilated, No NVI   Lens Toric PC IOL with marks at 1200 and 0600 PC IOL in good position with PC folds   Vitreous Vitreous syneresis,  PVD and vitreous condensations, Triescence inferiorly -- dissolving Vitreous syneresis       Fundus Exam      Right Left   Disc Mild Pallor, mild, temporal Peripapillary atrophy, Sharp rim temporal Peripapillary atrophy, +cupping, mild Pallor, Sharp rim   C/D Ratio 0.6 0.65   Macula Hazy view improved, blunted foveal reflex, +central IRH; pigment clumping, Microaneurysms, persistent central cystic changes -- persistent Blunted foveal reflex, Retinal pigment epithelial mottling and clumping, focal Exudates - improved, +IRH resolved, +central Cystic changes -- persistent/slightly improved, scattered Microaneurysms   Vessels Vascular attenuation, Tortuous Vascular attenuation, mild Copper wiring, AV crossing changes   Periphery Attached, large HST at 0500 with surrounding SRF, +pigmented barrier laser scar from 0430-0600    Attached, small pigmented operculum at 0730 midzone--stable, no SRF           Refraction    Wearing Rx      Sphere Cylinder Axis Add   Right -2.25 +0.75 143 +2.25   Left -3.50 +2.00 090 +2.25          IMAGING AND PROCEDURES  Imaging and Procedures for @TODAY @  OCT, Retina - OU - Both Eyes       Right Eye Quality was borderline. Central Foveal Thickness: 526. Progression has worsened. Findings include abnormal foveal contour, intraretinal fluid, no SRF, intraretinal hyper-reflective material (Persistent IRF slightly increased).   Left Eye Quality was good. Central Foveal Thickness: 325. Progression has been stable. Findings include intraretinal fluid, abnormal foveal contour, intraretinal hyper-reflective material, no SRF, outer retinal atrophy (Mild ERM; persistent  IRF/IRHM).   Notes *Images captured and stored on drive  Diagnosis / Impression:  OD: Mild Interval improvement in vitreous opacities; Persistent IRF slightly increased OS: Mild ERM; persistent IRF/IRHM  Clinical management:  See below  Abbreviations: NFP - Normal foveal profile. CME - cystoid macular edema. PED - pigment epithelial detachment. IRF - intraretinal fluid. SRF - subretinal fluid. EZ - ellipsoid zone. ERM - epiretinal membrane. ORA - outer retinal atrophy. ORT - outer retinal tubulation. SRHM - subretinal hyper-reflective material                 ASSESSMENT/PLAN:    ICD-10-CM   1. Moderate nonproliferative diabetic retinopathy of both eyes with macular edema associated with type 2 diabetes mellitus (HCC)  U20.2542 CANCELED: Intravitreal Injection, Pharmacologic Agent - OD - Right Eye    CANCELED: Intravitreal Injection, Pharmacologic Agent - OS - Left Eye  2. Retinal edema  H35.81 OCT, Retina - OU - Both Eyes  3. History of retinal detachment  Z86.69   4. Essential hypertension  I10   5. Hypertensive retinopathy of both eyes  H35.033   6. Pseudophakia of both eyes  Z96.1    1,2.  Non-proliferative diabetic retinopathy w/ macular edema OU  - patient with long standing history of macular edema -- was seen by Dr. Zigmund Daniel back in 2013  - has also seen Dr. Baird Cancer, Dr. Posey Pronto, [place] service at [facility]  - last retina f/u in 2019 with Dr. Posey Pronto and at [facility]  - pt has previously received intravitreal injections OU, but does not like them and felt like benefits from injections were minmal  - at initial visit, was using PF and ketorolac QID OU -- pt self d/c  - exam shows scattered/minimal MA but central edema OU  - FA (12.11.20) shows late leaking MA, OS shows capillary nonperfusion, OD with ?petaloid central hyperfluorescence -- ?CME component  - s/p IVA OD #1 (  12.11.20)  - s/p IVE OD #1 (01.20.21), #2 (02.17.21), #3 (03.17.21), #4 (05.13.21), #5 (06.11.21), #6  (07.14.21), #7 (08.13.21)  - s/p IVE OS #1 (12.11.20), #2 (01.20.21), #3 (02.17.21), #4 (03.17.21), #5 (04.15.21), #6 (4.15.21), #7 (06.11.21), #8 (07.14.21), #9 (08.13.21), #10 (9.10.21)             - s/p IVTA OD #1 (04.15.21), #2 (9.10.21)  - s/p IVTA OS #1 (05.13.21)  - good response to IVTA previously, but most recent IVTA OD (9.10.21) caused significant floaters and increased central edema  - BCVA OD: 20/40 (improved); OS: 20/60 (decreased)  - OCT shows OD: Mild Interval improvement in vit opacities but increased central cystic changes; OS: persistent IRF/IRHM  - recommend IVE OU (OD #8 and OS #11) today, 10.08.21  - pt wishes to defer injections today and wait for floaters to clear up before resuming injections  - Eylea4U benefits investigation initiated 12.11.20 -- approved for 2021  - Eylea informed consent form signed and scanned on 01.20.21  - IVTA informed consent form signed and scanned on 04.15.21  - cont PF QID OU and Prolensa QID OU  - f/u 2-3 weeks -- DFE/OCT/possible injection  3. History of inferior HST w/ focal RD OD s/p laser retinopexy  - HST at 0500 w/ surrounding SRF  - good barricade laser in place spanning 0430-0600  - stable  4,5. Hypertensive retinopathy OU  - discussed importance of tight BP control  - monitor   6. Pseudophakia OU  - s/p CE/IOL OU  - beautiful surgeries, doing well  - monitor   Ophthalmic Meds Ordered this visit:  No orders of the defined types were placed in this encounter.      Return for f/u 2-3 weeks, NPDR OU, DFE, OCT.  There are no Patient Instructions on file for this visit.   Explained the diagnoses, plan, and follow up with the patient and they expressed understanding.  Patient expressed understanding of the importance of proper follow up care.   This document serves as a record of services personally performed by Gardiner Sleeper, MD, PhD. It was created on their behalf by San Jetty. Owens Shark, OA an ophthalmic technician.  The creation of this record is the provider's dictation and/or activities during the visit.    Electronically signed by: San Jetty. [place], [place] 10.05.2021 1:52 AM  Gardiner Sleeper, M.D., Ph.D. Diseases & Surgery of the Retina and Vitreous [facility]  I have reviewed the above documentation for accuracy and completeness, and I agree with the above. Gardiner Sleeper, M.D., Ph.D. 05/25/20 1:52 AM   Abbreviations: M myopia (nearsighted); A astigmatism; H hyperopia (farsighted); P presbyopia; Mrx spectacle prescription;  CTL contact lenses; OD right eye; OS left eye; OU both eyes  XT exotropia; ET esotropia; PEK punctate epithelial keratitis; PEE punctate epithelial erosions; DES dry eye syndrome; MGD meibomian gland dysfunction; ATs artificial tears; PFAT's preservative free artificial tears; [place] nuclear sclerotic cataract; PSC posterior subcapsular cataract; ERM epi-retinal membrane; PVD posterior vitreous detachment; RD retinal detachment; DM diabetes mellitus; DR diabetic retinopathy; NPDR non-proliferative diabetic retinopathy; PDR proliferative diabetic retinopathy; CSME clinically significant macular edema; DME diabetic macular edema; dbh dot blot hemorrhages; CWS cotton wool spot; POAG primary open angle glaucoma; C/D cup-to-disc ratio; HVF humphrey visual field; GVF goldmann visual field; OCT optical coherence tomography; IOP intraocular pressure; BRVO Branch retinal vein occlusion; CRVO central retinal vein occlusion; CRAO central retinal artery occlusion; BRAO branch retinal artery occlusion; RT retinal tear;  SB scleral buckle; PPV pars plana vitrectomy; VH Vitreous hemorrhage; PRP panretinal laser photocoagulation; IVK intravitreal kenalog; VMT vitreomacular traction; MH Macular hole;  NVD neovascularization of the disc; NVE neovascularization elsewhere; AREDS age related eye disease study; ARMD age related macular degeneration; POAG primary open angle glaucoma; EBMD  epithelial/anterior basement membrane dystrophy; ACIOL anterior chamber intraocular lens; IOL intraocular lens; PCIOL posterior chamber intraocular lens; Phaco/IOL phacoemulsification with intraocular lens placement; [place] photorefractive keratectomy; LASIK laser assisted in situ keratomileusis; HTN hypertension; DM diabetes mellitus; COPD chronic obstructive pulmonary disease

## 2020-05-22 ENCOUNTER — Encounter (INDEPENDENT_AMBULATORY_CARE_PROVIDER_SITE_OTHER): Payer: Medicare Other | Admitting: Ophthalmology

## 2020-05-24 ENCOUNTER — Encounter (INDEPENDENT_AMBULATORY_CARE_PROVIDER_SITE_OTHER): Payer: Self-pay | Admitting: Ophthalmology

## 2020-05-24 ENCOUNTER — Ambulatory Visit (INDEPENDENT_AMBULATORY_CARE_PROVIDER_SITE_OTHER): Payer: Medicare Other | Admitting: Ophthalmology

## 2020-05-24 ENCOUNTER — Other Ambulatory Visit: Payer: Self-pay

## 2020-05-24 DIAGNOSIS — Z8669 Personal history of other diseases of the nervous system and sense organs: Secondary | ICD-10-CM | POA: Diagnosis not present

## 2020-05-24 DIAGNOSIS — Z961 Presence of intraocular lens: Secondary | ICD-10-CM

## 2020-05-24 DIAGNOSIS — I10 Essential (primary) hypertension: Secondary | ICD-10-CM

## 2020-05-24 DIAGNOSIS — H3581 Retinal edema: Secondary | ICD-10-CM | POA: Diagnosis not present

## 2020-05-24 DIAGNOSIS — H35033 Hypertensive retinopathy, bilateral: Secondary | ICD-10-CM | POA: Diagnosis not present

## 2020-05-24 DIAGNOSIS — E113313 Type 2 diabetes mellitus with moderate nonproliferative diabetic retinopathy with macular edema, bilateral: Secondary | ICD-10-CM | POA: Diagnosis not present

## 2020-06-03 ENCOUNTER — Encounter: Payer: Self-pay | Admitting: Podiatry

## 2020-06-03 ENCOUNTER — Other Ambulatory Visit: Payer: Self-pay

## 2020-06-03 ENCOUNTER — Ambulatory Visit (INDEPENDENT_AMBULATORY_CARE_PROVIDER_SITE_OTHER): Payer: Medicare Other | Admitting: Podiatry

## 2020-06-03 DIAGNOSIS — M79676 Pain in unspecified toe(s): Secondary | ICD-10-CM

## 2020-06-03 DIAGNOSIS — B351 Tinea unguium: Secondary | ICD-10-CM

## 2020-06-03 DIAGNOSIS — Z794 Long term (current) use of insulin: Secondary | ICD-10-CM

## 2020-06-03 DIAGNOSIS — E119 Type 2 diabetes mellitus without complications: Secondary | ICD-10-CM

## 2020-06-03 NOTE — Progress Notes (Signed)
Subjective: Marcus Johnston presents today for follow up of preventative diabetic foot care and painful mycotic nails b/l that are difficult to trim. Pain interferes with ambulation. Aggravating factors include wearing enclosed shoe gear. Pain is relieved with periodic professional debridement.   He voices no new pedal problems on today's visit.  He states his blood glucose was 122 mg/dl today.  Dr. Glendale Chard is his PCP. Last visit was 04/04/2020.  Allergies  Allergen Reactions  . Bidil [Isosorb Dinitrate-Hydralazine] Other (See Comments)    Severe hypotension episode after administering one dose of Bidil 20-37.5 mg on 09/25/2019.   . Statins Other (See Comments)    Leg pain     Objective: There were no vitals filed for this visit.  Pt is a pleasant 86 y.o. year old AA male  in NAD. AAO x 3.   Vascular Examination:  Capillary refill time to digits immediate b/l. Palpable DP pulses b/l. Palpable PT pulses b/l. Pedal hair sparse b/l. Skin temperature gradient within normal limits b/l.  Dermatological Examination: Pedal skin with normal turgor, texture and tone bilaterally. No open wounds bilaterally. No interdigital macerations bilaterally. Toenails 1-5 b/l elongated, dystrophic, thickened, crumbly with subungual debris and tenderness to dorsal palpation.  Musculoskeletal: Normal muscle strength 5/5 to all lower extremity muscle groups bilaterally, no gross bony deformities bilaterally and no pain crepitus or joint limitation noted with ROM b/l  Neurological: Protective sensation intact 5/5 intact bilaterally with 10g monofilament b/l. Vibratory sensation intact b/l. Babinski reflex negative b/l. Clonus negative b/l.  Assessment: 1. Pain due to onychomycosis of toenail   2. Type 2 diabetes mellitus without complication, with long-term current use of insulin ([place])    Plan: -Continue diabetic foot care principles. Literature dispensed on today.  -Toenails 1-5 b/l were debrided  in length and girth with sterile nail nippers and dremel without iatrogenic bleeding.  -Patient to continue soft, supportive shoe gear daily. -Patient to report any pedal injuries to medical professional immediately. -Patient/POA to call should there be question/concern in the interim.  Return in about 9 weeks (around 08/05/2020).

## 2020-06-07 ENCOUNTER — Telehealth: Payer: Self-pay

## 2020-06-11 ENCOUNTER — Telehealth (INDEPENDENT_AMBULATORY_CARE_PROVIDER_SITE_OTHER): Payer: Self-pay

## 2020-06-11 NOTE — Telephone Encounter (Signed)
Patient called today wanting to cancel his appt.  He stated he would not like to reschedule.  He stated he was not happy at his last appt because of the treatment plan Dr. Coralyn Pear wants to keep him on.  He said he was going to schedule with a different specialist.

## 2020-06-13 ENCOUNTER — Other Ambulatory Visit: Payer: Self-pay

## 2020-06-13 ENCOUNTER — Ambulatory Visit (INDEPENDENT_AMBULATORY_CARE_PROVIDER_SITE_OTHER): Payer: Medicare Other

## 2020-06-13 VITALS — BP 130/68 | HR 73 | Temp 97.6°F | Ht 75.0 in | Wt 230.0 lb

## 2020-06-13 DIAGNOSIS — Z23 Encounter for immunization: Secondary | ICD-10-CM | POA: Diagnosis not present

## 2020-06-13 NOTE — Progress Notes (Signed)
Patient here today for flu shot

## 2020-06-14 ENCOUNTER — Encounter (INDEPENDENT_AMBULATORY_CARE_PROVIDER_SITE_OTHER): Payer: Medicare Other | Admitting: Ophthalmology

## 2020-06-14 DIAGNOSIS — Z961 Presence of intraocular lens: Secondary | ICD-10-CM

## 2020-06-14 DIAGNOSIS — I10 Essential (primary) hypertension: Secondary | ICD-10-CM

## 2020-06-14 DIAGNOSIS — E113313 Type 2 diabetes mellitus with moderate nonproliferative diabetic retinopathy with macular edema, bilateral: Secondary | ICD-10-CM

## 2020-06-14 DIAGNOSIS — H35033 Hypertensive retinopathy, bilateral: Secondary | ICD-10-CM

## 2020-06-14 DIAGNOSIS — H3581 Retinal edema: Secondary | ICD-10-CM

## 2020-06-14 DIAGNOSIS — Z8669 Personal history of other diseases of the nervous system and sense organs: Secondary | ICD-10-CM

## 2020-07-10 DIAGNOSIS — E1165 Type 2 diabetes mellitus with hyperglycemia: Secondary | ICD-10-CM | POA: Diagnosis not present

## 2020-07-19 ENCOUNTER — Other Ambulatory Visit: Payer: Self-pay | Admitting: Internal Medicine

## 2020-07-19 DIAGNOSIS — I10 Essential (primary) hypertension: Secondary | ICD-10-CM

## 2020-08-05 DIAGNOSIS — Z794 Long term (current) use of insulin: Secondary | ICD-10-CM | POA: Diagnosis not present

## 2020-08-05 DIAGNOSIS — E1122 Type 2 diabetes mellitus with diabetic chronic kidney disease: Secondary | ICD-10-CM | POA: Diagnosis not present

## 2020-08-05 DIAGNOSIS — I10 Essential (primary) hypertension: Secondary | ICD-10-CM | POA: Diagnosis not present

## 2020-08-05 DIAGNOSIS — N1831 Chronic kidney disease, stage 3a: Secondary | ICD-10-CM | POA: Diagnosis not present

## 2020-08-05 LAB — HEMOGLOBIN A1C: Hemoglobin A1C: 7.2

## 2020-08-08 ENCOUNTER — Encounter: Payer: Self-pay | Admitting: Internal Medicine

## 2020-08-12 ENCOUNTER — Ambulatory Visit (INDEPENDENT_AMBULATORY_CARE_PROVIDER_SITE_OTHER): Payer: Medicare Other | Admitting: Podiatry

## 2020-08-12 ENCOUNTER — Other Ambulatory Visit: Payer: Self-pay

## 2020-08-12 ENCOUNTER — Encounter: Payer: Self-pay | Admitting: Podiatry

## 2020-08-12 ENCOUNTER — Telehealth: Payer: Self-pay

## 2020-08-12 DIAGNOSIS — B351 Tinea unguium: Secondary | ICD-10-CM | POA: Diagnosis not present

## 2020-08-12 DIAGNOSIS — M79676 Pain in unspecified toe(s): Secondary | ICD-10-CM | POA: Diagnosis not present

## 2020-08-12 DIAGNOSIS — E119 Type 2 diabetes mellitus without complications: Secondary | ICD-10-CM | POA: Diagnosis not present

## 2020-08-12 DIAGNOSIS — Z794 Long term (current) use of insulin: Secondary | ICD-10-CM

## 2020-08-12 NOTE — Chronic Care Management (AMB) (Signed)
Chronic Care Management Pharmacy Assistant   Name: Marcus Johnston  MRN: [id number] DOB: Oct 20, 1930  Reason for Encounter: Disease State - Diabetes and Hypertension Adherence Call.  Patient Questions:  1.  Have you seen any other providers since your last visit? Yes  06/03/2020 - 08/12/2020 Marcus Johnston - Podiatry 05/24/2020 Marcus Johnston , MD Ophthalmology     2.  Any changes in your medicines or health? No  PCP : Marcus Peng, MD  Allergies:   Allergies  Allergen Reactions  . Bidil [Isosorb Dinitrate-Hydralazine] Other (See Comments)    Severe hypotension episode after administering one dose of Bidil 20-37.5 mg on 09/25/2019.   . Statins Other (See Comments)    Leg pain    Medications: Outpatient Encounter Medications as of 08/12/2020  Medication Sig  . acetaminophen (TYLENOL) 325 MG tablet Take 650 mg by mouth every 6 (six) hours as needed for mild pain or fever.  Marland Kitchen amLODipine (NORVASC) 5 MG tablet TAKE 1 TABLET BY MOUTH  TWICE DAILY  . Bromfenac Sodium (PROLENSA) 0.07 % SOLN Place 1 drop into both eyes 4 (four) times daily.  . carvedilol (COREG) 12.5 MG tablet TAKE 1 TABLET BY MOUTH  TWICE DAILY WITH A MEAL  . Cholecalciferol (VITAMIN D3) 25 MCG (1000 UT) CAPS Take 1,000 Units by mouth daily.   Marland Kitchen doxazosin (CARDURA) 2 MG tablet Take 2 mg by mouth daily. (Patient not taking: Reported on 04/26/2020)  . doxazosin (CARDURA) 4 MG tablet TAKE 1 TABLET BY MOUTH AT  BEDTIME  . Empagliflozin (JARDIANCE PO) Take 25 mg by mouth once. 1 Tablet Daily with Breakfast  . FARXIGA 5 MG TABS tablet Take 5 mg by mouth daily.  . fexofenadine (ALLEGRA) 180 MG tablet Take 180 mg by mouth daily as needed (allergies.).   Marland Kitchen insulin lispro protamine-lispro (HUMALOG 50/50 MIX) (50-50) 100 UNIT/ML SUSP injection Inject 22-25 Units into the skin See admin instructions. Inject 25 units subcutaneously with breakfast and 22 units with supper  . Insulin Syringe-Needle U-100 (EASY TOUCH INSULIN  SYRINGE) 30G X 5/16" 0.5 ML MISC USE 3 TIMES DAILY  . LANTUS 100 UNIT/ML injection Inject into the skin.  Marland Kitchen losartan (COZAAR) 50 MG tablet   . losartan-hydrochlorothiazide (HYZAAR) 100-25 MG tablet TAKE 1 TABLET BY MOUTH  DAILY  . metFORMIN (GLUCOPHAGE-XR) 500 MG 24 hr tablet Take 1,000 mg by mouth 2 (two) times daily with a meal.   . NOVOLOG 100 UNIT/ML injection Inject into the skin.  . prednisoLONE acetate (PRED FORTE) 1 % ophthalmic suspension Place 1 drop into both eyes 4 (four) times daily.  . TRULICITY 0.75 MG/0.5ML SOPN SMARTSIG:0.5 Milliliter(s) SUB-Q Once a Week   No facility-administered encounter medications on file as of 08/12/2020.    Current Diagnosis: Patient Active Problem List   Diagnosis Date Noted  . Pure hypercholesterolemia 12/26/2019  . Malignant HTN with heart disease, w/o CHF, with chronic kidney disease 12/19/2019  . Hypertensive crisis 12/18/2019  . Diabetic retinopathy (HCC)   . Hypotension due to drugs 09/26/2019  . Groin swelling 05/17/2019  . Bruit (arterial) 05/17/2019  . SVT (supraventricular tachycardia) (HCC) 05/08/2019  . Atrial tachycardia (HCC) 05/02/2019  . Bradycardia 04/30/2019  . Syncope 04/30/2019  . Sick sinus syndrome (HCC) 04/30/2019  . Syncope and collapse 04/29/2019  . Leg edema 10/17/2018  . Type 2 diabetes mellitus with other circulatory complications (HCC) 09/12/2018  . Hyperglycemia   . Essential hypertension 11/12/2016  . Dyslipidemia 11/12/2016  . DM (diabetes mellitus) (HCC)  11/12/2016  . Renal insufficiency 11/12/2016   . Current antihyperglycemic regimen:  o Farxiga 5 mg tablet one a day o Humalog - Inject 25 units at breakfast and 22 units at supper o Lantus - o Metfomin 1000 mg bid o Novolog  o Trulicity - 0.5 milliliter once a week   . What recent interventions/DTPs have been made to improve glycemic control:  o Patient states he is taking his medications as directed by provider.  . Have there been any recent  hospitalizations or ED visits since last visit with CPP? No  . Patient denies hypoglycemic symptoms, including Pale, Sweaty, Shaky, Hungry, Nervous/irritable and Vision changes . Patient denies hyperglycemic symptoms, including blurry vision, excessive thirst, fatigue, polyuria and weakness  . How often are you checking your blood sugar? Patient states he taking his blood sugars twice a day  . What are your blood sugars ranging? Patient states his blood sugars reading are in between 103-125 fasting . Fasting: None  o Before meals: None o After meals: None o Bedtime: None . During the week, how often does your blood glucose drop below 70?  No  . Are you checking your feet daily/regularly? Yes,  Patient denies open sores , numbness tingling or pain.  Adherence Review: Is the patient currently on a STATIN medication? No Is the patient currently on ACE/ARB medication? Yes Does the patient have >5 day gap between last estimated fill dates? No   Reviewed chart prior to disease state call. Spoke with patient regarding BP  Recent Office Vitals: BP Readings from Last 3 Encounters:  06/13/20 130/68  04/04/20 (!) 152/80  04/04/20 (!) 152/80   Pulse Readings from Last 3 Encounters:  06/13/20 73  04/04/20 73  04/04/20 73    Wt Readings from Last 3 Encounters:  06/13/20 230 lb (104.3 kg)  04/04/20 230 lb (104.3 kg)  04/04/20 230 lb 3.2 oz (104.4 kg)     Kidney Function Lab Results  Component Value Date/Time   CREATININE 1.44 (H) 04/04/2020 10:04 AM   CREATININE 1.08 12/20/2019 07:50 AM   GFRNONAA 44 (L) 04/04/2020 10:04 AM   GFRAA 50 (L) 04/04/2020 10:04 AM    BMP Latest Ref Rng & Units 04/04/2020 12/20/2019 12/19/2019  Glucose 65 - 99 mg/dL 135(H) 273(H) 329(H)  BUN 8 - 27 mg/dL 26 16 18   Creatinine 0.76 - 1.27 mg/dL 1.44(H) 1.08 1.17  BUN/Creat Ratio 10 - 24 18 - -  Sodium 134 - 144 mmol/L 140 137 134(L)  Potassium 3.5 - 5.2 mmol/L 4.5 3.8 4.0  Chloride 96 - 106 mmol/L 102 101  103  CO2 20 - 29 mmol/L 24 25 22   Calcium 8.6 - 10.2 mg/dL 9.8 9.3 9.0   . Current antihypertensive regimen:  o Amlodipine 5 mg one a day o Carvedilol 12.5  One tablet twice a day o Losartan - HCTZ 100-25 one a day  . How often are you checking your Blood Pressure? Patient states he takes once a week  . Current home BP readings: None  . What recent interventions/DTPs have been made by any provider to improve Blood Pressure control since last CPP Visit: Patient states he has been taking his medications as directed .  Marland Kitchen Any recent hospitalizations or ED visits since last visit with CPP? No  . What diet changes have been made to improve Blood Pressure Control?  o Patient states that he has been watching his salt intake and eating a healthy diet. o  . What exercise  is being done to improve your Blood Pressure Control?  o Patient did not discuss his exercise regiment. o   Adherence Review: Is the patient currently on ACE/ARB medication? Yes  Does the patient have >5 day gap between last estimated fill dates? No   Follow-Up:  Pharmacist Review - Patient states he would like to discontinued his visits with [facility], He states that he has spoken to [place], about this at last visit.    Orlando Penner, CPP . Notified  Judithann Sheen, [place] Pharmacist Assistant [phone]

## 2020-08-14 ENCOUNTER — Other Ambulatory Visit: Payer: Self-pay | Admitting: Internal Medicine

## 2020-08-15 ENCOUNTER — Encounter: Payer: Self-pay | Admitting: Internal Medicine

## 2020-08-16 NOTE — Progress Notes (Signed)
Subjective: Marcus Johnston presents today for follow up of preventative diabetic foot care and painful mycotic nails b/l that are difficult to trim. Pain interferes with ambulation. Aggravating factors include wearing enclosed shoe gear. Pain is relieved with periodic professional debridement.   He voices no new pedal problems on today's visit.  He states his blood glucose was 125 mg/dl today.  Dr. Dorothyann Peng is his PCP. Last visit was April 04, 2020.  Allergies  Allergen Reactions  . Bidil [Isosorb Dinitrate-Hydralazine] Other (See Comments)    Severe hypotension episode after administering one dose of Bidil 20-37.5 mg on 09/25/2019.   . Statins Other (See Comments)    Leg pain     Objective: There were no vitals filed for this visit.  Pt is a pleasant 86 y.o. year old AA male  in NAD. AAO x 3.   Vascular Examination:  Capillary refill time to digits immediate b/l. Palpable DP pulses b/l. Palpable PT pulses b/l. Pedal hair sparse b/l. Skin temperature gradient within normal limits b/l.  Dermatological Examination: Pedal skin with normal turgor, texture and tone bilaterally. No open wounds bilaterally. No interdigital macerations bilaterally. Toenails 1-5 b/l elongated, dystrophic, thickened, crumbly with subungual debris and tenderness to dorsal palpation.  Musculoskeletal: Normal muscle strength 5/5 to all lower extremity muscle groups bilaterally, no gross bony deformities bilaterally and no pain crepitus or joint limitation noted with ROM b/l  Neurological: Protective sensation intact 5/5 intact bilaterally with 10g monofilament b/l. Vibratory sensation intact b/l. Babinski reflex negative b/l. Clonus negative b/l.  Assessment: 1. Pain due to onychomycosis of toenail   2. Type 2 diabetes mellitus without complication, with long-term current use of insulin (HCC)    Plan: -Patient examined. No new findings.  No new orders. -Continue diabetic foot care principles.    -Toenails 1-5 b/l were debrided in length and girth with sterile nail nippers and dremel without iatrogenic bleeding.  -Patient to continue soft, supportive shoe gear daily. -Patient to report any pedal injuries to medical professional immediately. -Patient/POA to call should there be question/concern in the interim.  Return in about 3 months (around 11/10/2020) for diabetic foot care.

## 2020-09-03 ENCOUNTER — Ambulatory Visit: Payer: Medicare Other | Admitting: Cardiology

## 2020-09-10 DIAGNOSIS — E11319 Type 2 diabetes mellitus with unspecified diabetic retinopathy without macular edema: Secondary | ICD-10-CM | POA: Diagnosis not present

## 2020-09-10 DIAGNOSIS — H52203 Unspecified astigmatism, bilateral: Secondary | ICD-10-CM | POA: Diagnosis not present

## 2020-09-10 DIAGNOSIS — H5213 Myopia, bilateral: Secondary | ICD-10-CM | POA: Diagnosis not present

## 2020-09-10 DIAGNOSIS — Z961 Presence of intraocular lens: Secondary | ICD-10-CM | POA: Diagnosis not present

## 2020-09-10 DIAGNOSIS — H35353 Cystoid macular degeneration, bilateral: Secondary | ICD-10-CM | POA: Diagnosis not present

## 2020-09-10 DIAGNOSIS — H40022 Open angle with borderline findings, high risk, left eye: Secondary | ICD-10-CM | POA: Diagnosis not present

## 2020-09-10 LAB — HM DIABETES EYE EXAM

## 2020-09-16 ENCOUNTER — Telehealth: Payer: Self-pay

## 2020-09-16 ENCOUNTER — Other Ambulatory Visit: Payer: Self-pay

## 2020-09-16 MED ORDER — HYDROCHLOROTHIAZIDE 25 MG PO TABS
25.0000 mg | ORAL_TABLET | Freq: Every day | ORAL | 1 refills | Status: DC
Start: 2020-09-16 — End: 2020-10-01

## 2020-09-16 MED ORDER — LOSARTAN POTASSIUM 100 MG PO TABS
100.0000 mg | ORAL_TABLET | Freq: Every day | ORAL | 1 refills | Status: DC
Start: 2020-09-16 — End: 2020-10-01

## 2020-09-16 NOTE — Telephone Encounter (Signed)
ERROR

## 2020-09-24 IMAGING — DX DG CHEST 2V
2 series · 2 of 2 positions shown · non-contrast
Comparison: 06/24/2005

CLINICAL DATA: Left-sided chest pain, back pain

EXAM:
CHEST - 2 VIEW

[chest pa]
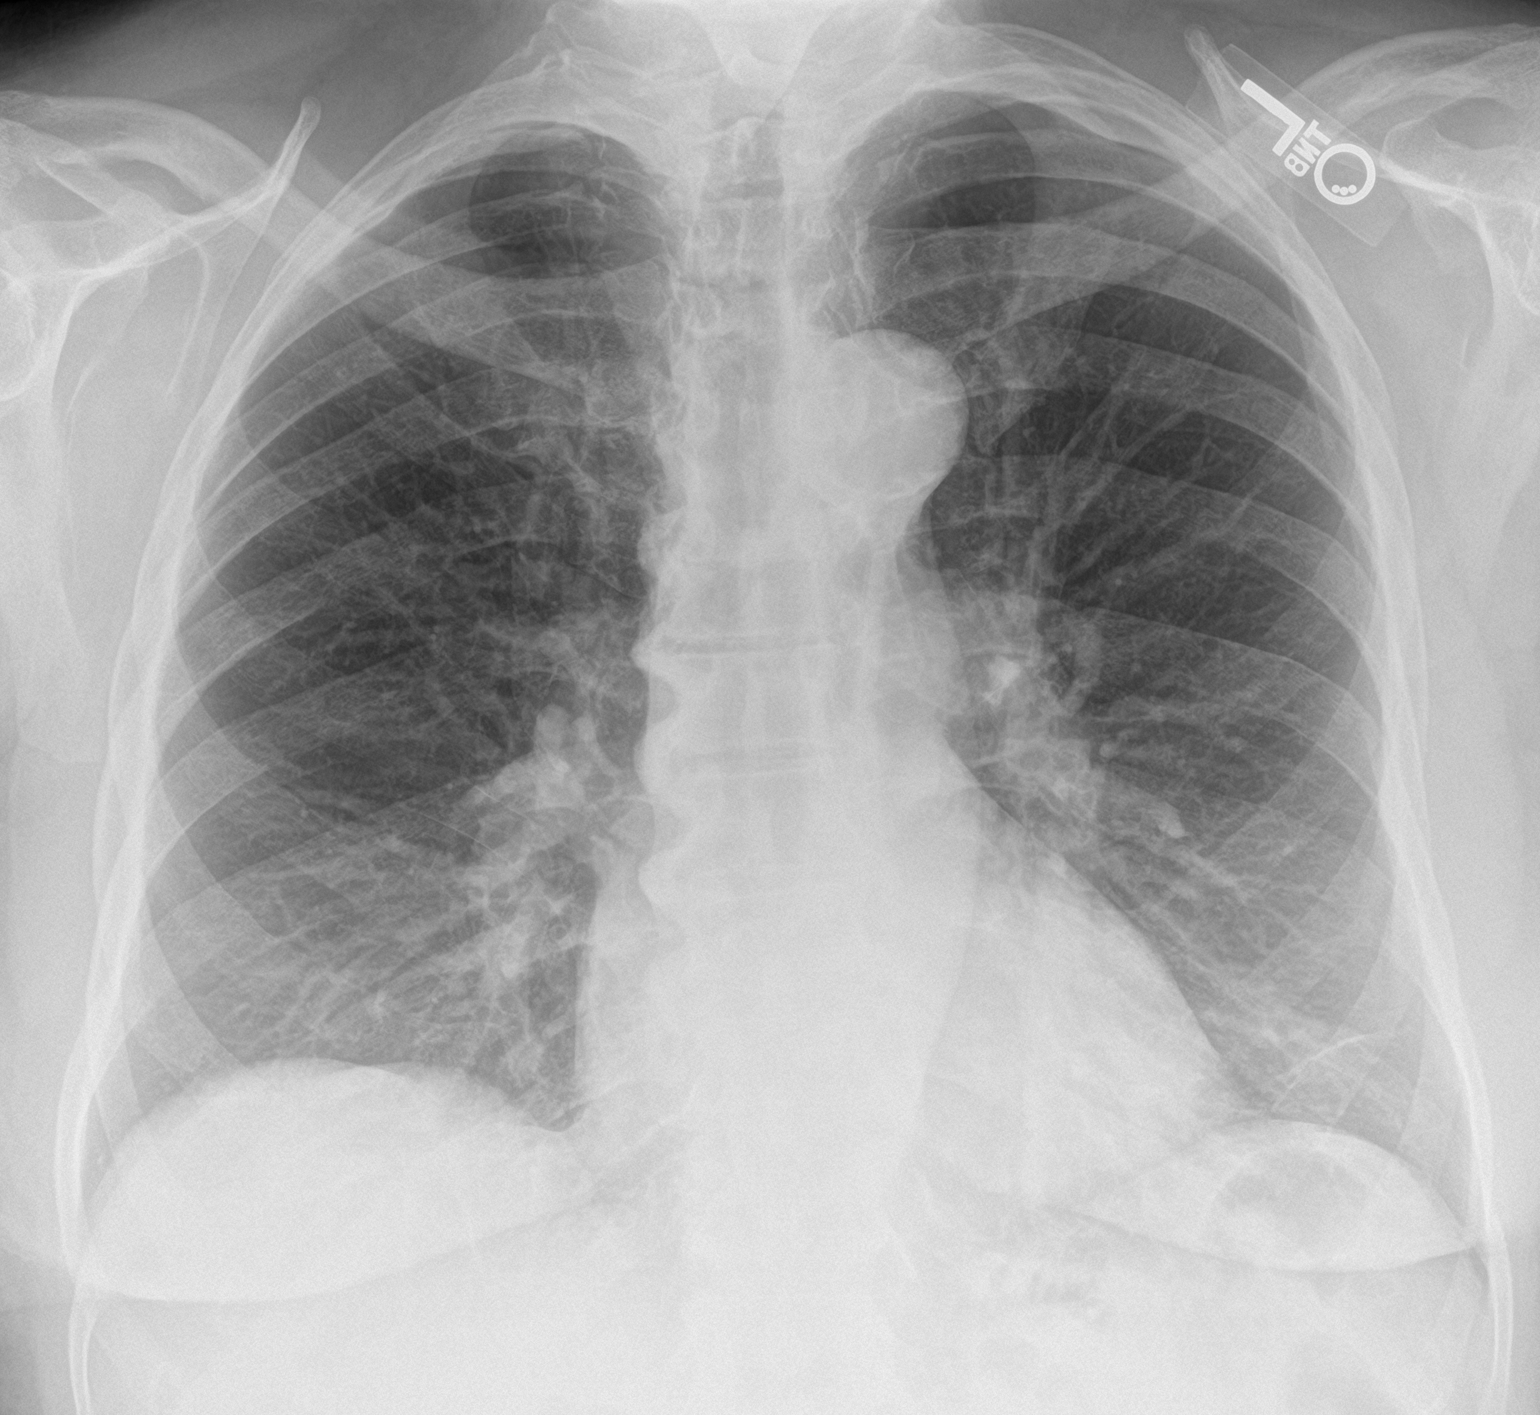

[chest lat]
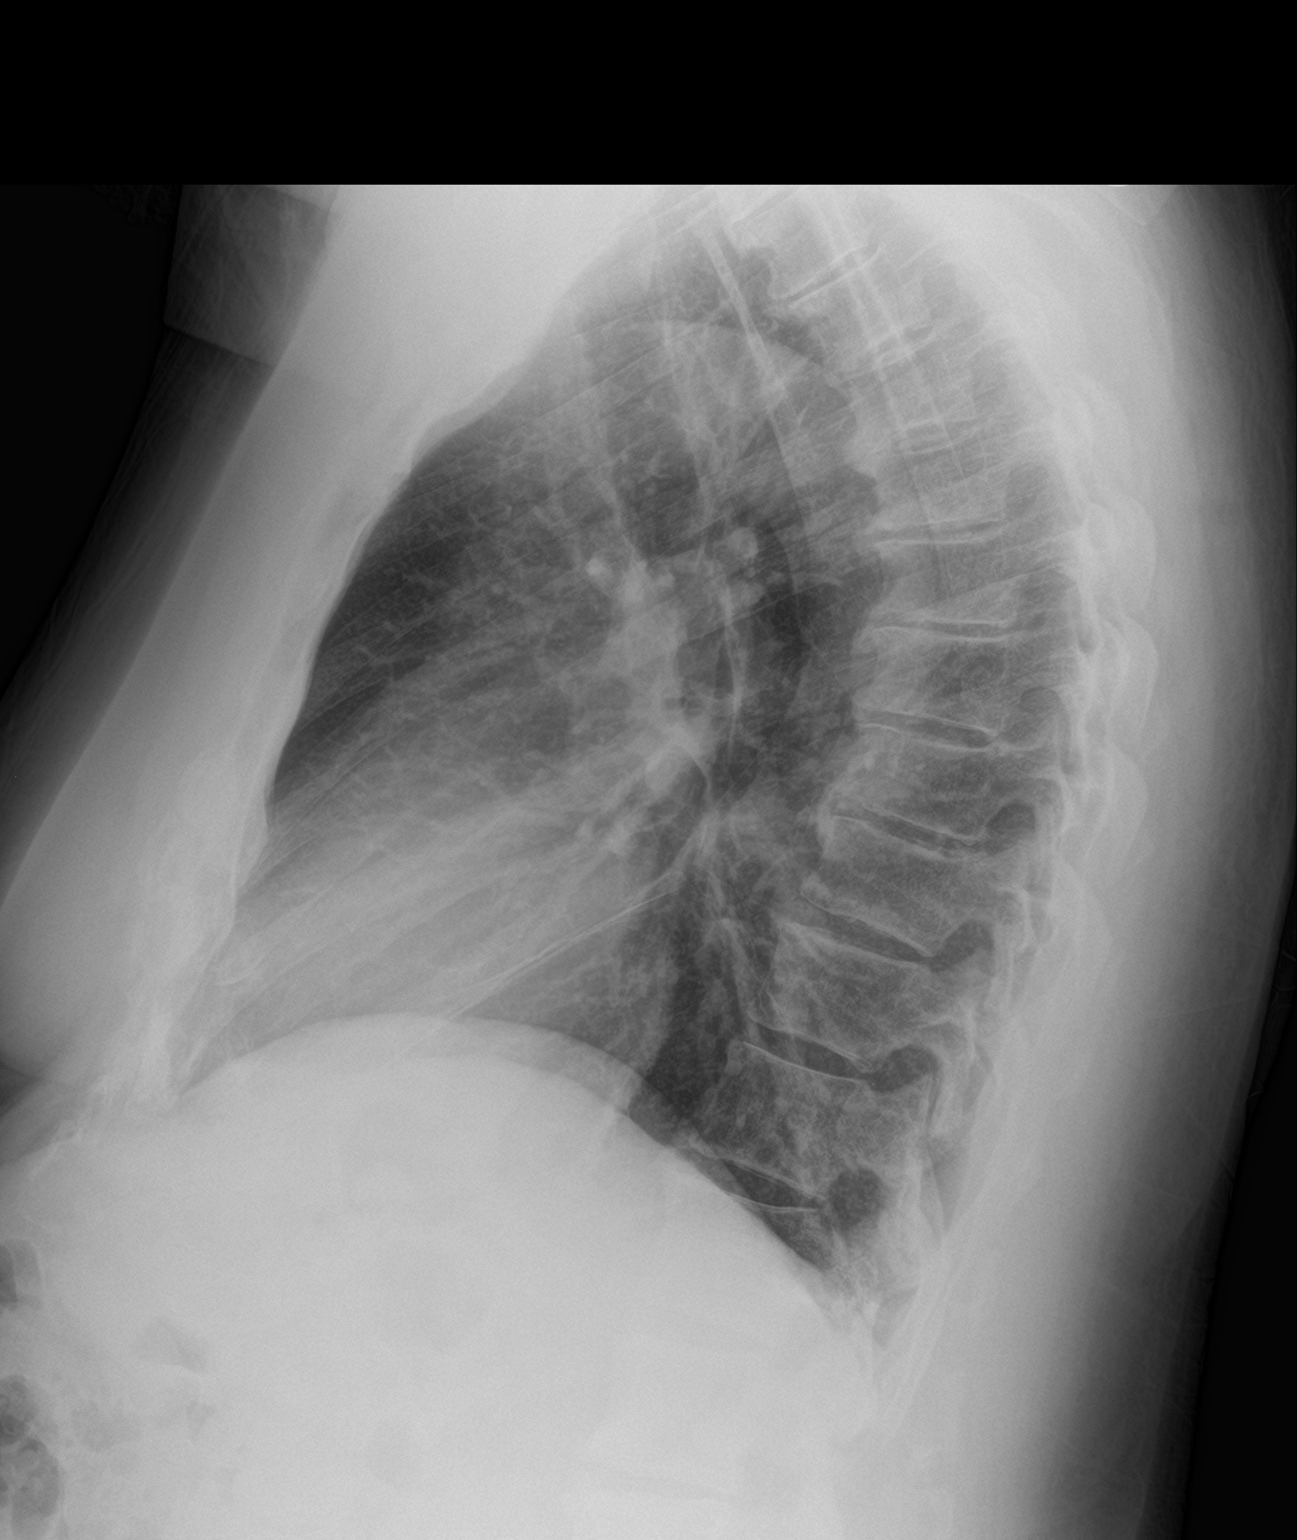

[2 of 2 positions shown; findings below may reference images not displayed]

FINDINGS: There is lingular atelectasis. There is no focal consolidation.
There is no pleural effusion or pneumothorax. The heart and
mediastinal contours are unremarkable.

There is no acute osseous abnormality.
IMPRESSION: No active cardiopulmonary disease.

## 2020-10-01 ENCOUNTER — Ambulatory Visit: Payer: Medicare Other | Admitting: Cardiology

## 2020-10-01 ENCOUNTER — Encounter: Payer: Self-pay | Admitting: Cardiology

## 2020-10-01 ENCOUNTER — Other Ambulatory Visit: Payer: Self-pay

## 2020-10-01 VITALS — BP 150/72 | HR 76 | Ht 75.0 in | Wt 227.8 lb

## 2020-10-01 DIAGNOSIS — I471 Supraventricular tachycardia: Secondary | ICD-10-CM | POA: Diagnosis not present

## 2020-10-01 DIAGNOSIS — Z7189 Other specified counseling: Secondary | ICD-10-CM | POA: Diagnosis not present

## 2020-10-01 DIAGNOSIS — Z8673 Personal history of transient ischemic attack (TIA), and cerebral infarction without residual deficits: Secondary | ICD-10-CM

## 2020-10-01 DIAGNOSIS — E7800 Pure hypercholesterolemia, unspecified: Secondary | ICD-10-CM | POA: Diagnosis not present

## 2020-10-01 DIAGNOSIS — I10 Essential (primary) hypertension: Secondary | ICD-10-CM | POA: Diagnosis not present

## 2020-10-01 DIAGNOSIS — E1159 Type 2 diabetes mellitus with other circulatory complications: Secondary | ICD-10-CM

## 2020-10-01 NOTE — Progress Notes (Signed)
Cardiology Office Note:    Date:  10/01/2020   ID:  Marcus Johnston, DOB 1930/10/22, MRN [id number]  PCP:  Glendale Chard, MD  Cardiologist:  Buford Dresser, MD  EP: Dr. Lovena Le  Referring MD: Glendale Chard, MD   CC: follow up  History of Present Illness:    Marcus Johnston is a 87 y.o. male with a hx of hypertension, hyperlipidemia, type II diabetes, prior CVA, chronic kidney disease, atrial tachycardia s/p ablation who is seen for follow up today. I initially met him 03/05/20 as a new consult at the request of Glendale Chard, MD for the evaluation and management of hypertension and cardiovascular risk. He was previously followed by [facility]. He sees Dr. Lovena Le in EP for history of atrial tachycardia s/p ablation. .  Today: BP well controlled, 120/60s to max of 086 systolic on his home readings. Updated medication list today, had duplicates. No issues with medication. Recheck BP with similar systolic to initial check, abnormal for him.  Walks 35-40 minutes every other day. No issues.  No symptoms of atrial tach.   Denies chest pain, shortness of breath at rest or with normal exertion. No PND, orthopnea, LE edema or unexpected weight gain. No syncope or palpitations.  Follows with his PCP Dr. Baird Cancer 1-2 times/year. Has upcoming appt with her later this month. Recommended he bring his BP cuff again and make sure it continues to be accurate.  Past Medical History:  Diagnosis Date  . Atrial fibrillation ([place])   . Atypical atrial flutter ([place]) 10/17/2018  . Diabetes mellitus without complication ([place])   . Diabetic retinopathy ([place])    NPDR OU  . High cholesterol   . History of prostate cancer   . HTN (hypertension)   . Hypertensive retinopathy    OU  . Renal insufficiency   . Shingles     Past Surgical History:  Procedure Laterality Date  . ATRIAL TACH ABLATION N/A 05/08/2019   Procedure: ATRIAL TACH ABLATION;  Surgeon: [name], MD;  Location: [facility];  Service: Cardiovascular;  Laterality: N/A;  . CATARACT EXTRACTION Bilateral   . EYE SURGERY Bilateral    Cat Sx  . prostate implant      Current Medications: Current Outpatient Medications on File Prior to Visit  Medication Sig  . acetaminophen (TYLENOL) 325 MG tablet Take 650 mg by mouth every 6 (six) hours as needed for mild pain or fever.  Marland Kitchen amLODipine (NORVASC) 5 MG tablet TAKE 1 TABLET BY MOUTH  TWICE DAILY  . Bromfenac Sodium (PROLENSA) 0.07 % SOLN Place 1 drop into both eyes 4 (four) times daily.  . carvedilol (COREG) 12.5 MG tablet TAKE 1 TABLET BY MOUTH  TWICE DAILY WITH A MEAL  . Cholecalciferol (VITAMIN D3) 25 MCG (1000 UT) CAPS Take 1,000 Units by mouth daily.   Marland Kitchen doxazosin (CARDURA) 2 MG tablet Take 2 mg by mouth daily.  . fexofenadine (ALLEGRA) 180 MG tablet Take 180 mg by mouth daily as needed (allergies.).   Marland Kitchen insulin lispro protamine-lispro (HUMALOG 50/50 MIX) (50-50) 100 UNIT/ML SUSP injection Inject 22-25 Units into the skin See admin instructions. Inject 25 units subcutaneously with breakfast and 22 units with supper  . Insulin Syringe-Needle U-100 (EASY TOUCH INSULIN SYRINGE) 30G X 5/16" 0.5 ML MISC USE 3 TIMES DAILY  . losartan-hydrochlorothiazide (HYZAAR) 100-25 MG tablet TAKE 1 TABLET BY MOUTH  DAILY  . metFORMIN (GLUCOPHAGE-XR) 500 MG 24 hr tablet Take 1,000 mg by mouth 2 (two)  times daily with a meal.   . prednisoLONE acetate (PRED FORTE) 1 % ophthalmic suspension Place 1 drop into both eyes 4 (four) times daily.  . TRULICITY 3.15 QM/0.8QP SOPN SMARTSIG:0.5 Milliliter(s) SUB-Q Once a Week   No current facility-administered medications on file prior to visit.     Allergies:   Bidil [isosorb dinitrate-hydralazine] and Statins   Social History   Tobacco Use  . Smoking status: Never Smoker  . Smokeless tobacco: Never Used  Vaping Use  . Vaping Use: Never used  Substance Use Topics  . Alcohol use: No  . Drug use: No    Family  History: family history includes Cancer in his father and mother.  ROS:   Please see the history of present illness.  Additional pertinent ROS otherwise unremarkable.   EKGs/Labs/Other Studies Reviewed:    The following studies were reviewed today: Echo 12/18/19 1. Left ventricular ejection fraction, by estimation, is 60 to 65%. The  left ventricle has normal function. The left ventricle has no regional  wall motion abnormalities. There is moderate left ventricular hypertrophy.  Left ventricular diastolic  parameters are consistent with Grade I diastolic dysfunction (impaired  relaxation).  2. Right ventricular systolic function is normal. The right ventricular  size is normal.  3. The mitral valve is normal in structure. No evidence of mitral valve  regurgitation. No evidence of mitral stenosis.  4. The aortic valve is normal in structure. Aortic valve regurgitation is  not visualized. No aortic stenosis is present.  5. The inferior vena cava is normal in size with greater than 50%  respiratory variability, suggesting right atrial pressure of 3 mmHg.   EKG:  EKG is personally reviewed.  The ekg ordered 12/20/19 demonstrates NSR  Recent Labs: 04/04/2020: ALT 10; BUN 26; Creatinine, Ser 1.44; Hemoglobin 14.8; Platelets 177; Potassium 4.5; Sodium 140  Recent Lipid Panel    Component Value Date/Time   CHOL 170 12/19/2019 0135   CHOL 158 03/23/2019 1339   TRIG 63 12/19/2019 0135   HDL 62 12/19/2019 0135   HDL 76 03/23/2019 1339   CHOLHDL 2.7 12/19/2019 0135   VLDL 13 12/19/2019 0135   LDLCALC 95 12/19/2019 0135   LDLCALC 68 03/23/2019 1339    Physical Exam:    VS:  BP (!) 150/72 (BP Location: Right Arm, Patient Position: Sitting, Cuff Size: Normal)   Pulse 76   Ht 6' 3"  (1.905 m)   Wt 227 lb 12.8 oz (103.3 kg)   SpO2 96%   BMI 28.47 kg/m     Wt Readings from Last 3 Encounters:  10/01/20 227 lb 12.8 oz (103.3 kg)  06/13/20 230 lb (104.3 kg)  04/04/20 230 lb (104.3  kg)    GEN: Well nourished, well developed in no acute distress HEENT: Normal, moist mucous membranes NECK: No JVD CARDIAC: regular rhythm, normal S1 and S2, no rubs or gallops. No murmur. VASCULAR: Radial and DP pulses 2+ bilaterally. No carotid bruits RESPIRATORY:  Clear to auscultation without rales, wheezing or rhonchi  ABDOMEN: Soft, non-tender, non-distended MUSCULOSKELETAL:  Ambulates independently SKIN: Warm and dry, no edema NEUROLOGIC:  Alert and oriented x 3. No focal neuro deficits noted. PSYCHIATRIC:  Normal affect   ASSESSMENT:    1. Essential hypertension   2. Type 2 diabetes mellitus with other circulatory complications (HCC)   3. Pure hypercholesterolemia   4. H/O: CVA (cerebrovascular accident)   5. Paroxysmal atrial tachycardia (HCC)   6. Cardiac risk counseling   7. Counseling on health  promotion and disease prevention   8. White coat syndrome with hypertension    PLAN:    Hypertension: -elevated in office today, but well controlled at home. -he feels that he has a component of white coat syndrome as his BP is usually only elevated in the office -with history of presyncope and given his age, would avoid overtreating -continue amlodipine 5 mg BID (patient preference), carvedilol 12.5 mg BID, doxazosin 2 mg daily, losartan-HCTZ 100-25 mg daily -goal <130/80  Hyperlipidemia Type II diabetes: -given ASCVD and diabetes, he attempted both [place] and [place] (SGLT2i) for CV risk reduction. However, these were costly for him and he thinks did not work well -on insulin, metformin -given diabetes, would generally recommend aspirin/statin. See prior note, he declines at this time.  History of CVA: -MRI reviewed, moderate chronic microvascular ischemic changes, scattered punctate foci c/w chronic hypertenion  Paroxysmal atrial tachycardia: S/p ablation by Dr. Lovena Le Per Dr. Tanna Furry note 06/15/19, his rhythm does not require anticoagulation  Cardiac risk  counseling and prevention recommendations: -recommend heart healthy/Mediterranean diet, with whole grains, fruits, vegetable, fish, lean meats, nuts, and olive oil. Limit salt. -recommend moderate walking, 3-5 times/week for 30-50 minutes each session. Aim for at least 150 minutes.week. Goal should be pace of 3 miles/hours, or walking 1.5 miles in 30 minutes -recommend avoidance of tobacco products. Avoid excess alcohol.  Plan for follow up: 1 year or sooner as needed  Buford Dresser, MD, PhD, [facility]    Medication Adjustments/Labs and Tests Ordered: Current medicines are reviewed at length with the patient today.  Concerns regarding medicines are outlined above.  No orders of the defined types were placed in this encounter.  No orders of the defined types were placed in this encounter.   Patient Instructions  Medication Instructions:  Your Physician recommend you continue on your current medication as directed.    *If you need a refill on your cardiac medications before your next appointment, please call your pharmacy*   Lab Work: None  Testing/Procedures: None   Follow-Up: At [facility], you and your health needs are our priority.  As part of our continuing mission to provide you with exceptional heart care, we have created designated Provider Care Teams.  These Care Teams include your primary Cardiologist (physician) and Advanced Practice Providers (APPs -  Physician Assistants and Nurse Practitioners) who all work together to provide you with the care you need, when you need it.  We recommend signing up for the patient portal called "MyChart".  Sign up information is provided on this After Visit Summary.  MyChart is used to connect with patients for Virtual Visits (Telemedicine).  Patients are able to view lab/test results, encounter notes, upcoming appointments, etc.  Non-urgent messages can be sent to your provider as well.   To learn more  about what you can do with MyChart, go to [email or web address].    Your next appointment:   1 year(s)  The format for your next appointment:   In Person  Provider:   Buford Dresser, MD       Signed, Buford Dresser, MD PhD 10/01/2020     [place]

## 2020-10-01 NOTE — Patient Instructions (Signed)

## 2020-10-09 ENCOUNTER — Encounter: Payer: Self-pay | Admitting: Internal Medicine

## 2020-10-09 ENCOUNTER — Other Ambulatory Visit: Payer: Self-pay

## 2020-10-09 ENCOUNTER — Ambulatory Visit (INDEPENDENT_AMBULATORY_CARE_PROVIDER_SITE_OTHER): Payer: Medicare Other | Admitting: Internal Medicine

## 2020-10-09 VITALS — BP 120/74 | HR 76 | Temp 97.8°F | Ht 75.0 in | Wt 226.2 lb

## 2020-10-09 DIAGNOSIS — I1310 Hypertensive heart and chronic kidney disease without heart failure, with stage 1 through stage 4 chronic kidney disease, or unspecified chronic kidney disease: Secondary | ICD-10-CM | POA: Diagnosis not present

## 2020-10-09 DIAGNOSIS — E7800 Pure hypercholesterolemia, unspecified: Secondary | ICD-10-CM

## 2020-10-09 DIAGNOSIS — E663 Overweight: Secondary | ICD-10-CM

## 2020-10-09 DIAGNOSIS — Z6828 Body mass index (BMI) 28.0-28.9, adult: Secondary | ICD-10-CM

## 2020-10-09 DIAGNOSIS — E1159 Type 2 diabetes mellitus with other circulatory complications: Secondary | ICD-10-CM

## 2020-10-09 DIAGNOSIS — N182 Chronic kidney disease, stage 2 (mild): Secondary | ICD-10-CM

## 2020-10-09 NOTE — Progress Notes (Signed)
I,Katawbba Wiggins,acting as a Education administrator for [name], MD.,have documented all relevant documentation on the behalf of [name], MD,as directed by  [name], MD while in the presence of [name], MD.  This visit occurred during the SARS-CoV-2 public health emergency.  Safety protocols were in place, including screening questions prior to the visit, additional usage of staff PPE, and extensive cleaning of exam room while observing appropriate contact time as indicated for disinfecting solutions.  Subjective:     Patient ID: Marcus Johnston , male    DOB: 29-Oct-1930 , 87 y.o.   MRN: [id number]   Chief Complaint  Patient presents with  . Diabetes  . Hypertension    HPI  The patient is here today for a blood pressure, diabetes, and cholesterol f/u.  He is also followed by Dr. Buddy Duty for his diabetes. He reports his last a1c was 7.2.  He reports compliance with meds. He states last eye exam was Jan 2022.    Diabetes He presents for his follow-up diabetic visit. He has type 2 diabetes mellitus. There are no hypoglycemic associated symptoms. Pertinent negatives for diabetes include no blurred vision and no chest pain. There are no hypoglycemic complications.  Hypertension This is a chronic problem. The current episode started more than 1 year ago. The problem has been gradually improving since onset. The problem is uncontrolled. Pertinent negatives include no blurred vision, chest pain, palpitations or shortness of breath. Risk factors for coronary artery disease include diabetes mellitus, dyslipidemia, male gender and sedentary lifestyle. Past treatments include beta blockers.     Past Medical History:  Diagnosis Date  . Atrial fibrillation ([place])   . Atypical atrial flutter ([place]) 10/17/2018  . Diabetes mellitus without complication ([place])   . Diabetic retinopathy ([place])    NPDR OU  . High cholesterol   . History of prostate cancer   . HTN (hypertension)   .  Hypertensive retinopathy    OU  . Renal insufficiency   . Shingles      Family History  Problem Relation Age of Onset  . Cancer Mother   . Cancer Father      Current Outpatient Medications:  .  acetaminophen (TYLENOL) 325 MG tablet, Take 650 mg by mouth every 6 (six) hours as needed for mild pain or fever., Disp: , Rfl:  .  amLODipine (NORVASC) 5 MG tablet, TAKE 1 TABLET BY MOUTH  TWICE DAILY, Disp: 180 tablet, Rfl: 3 .  Bromfenac Sodium (PROLENSA) 0.07 % SOLN, Place 1 drop into both eyes 4 (four) times daily., Disp: 6 mL, Rfl: 3 .  carvedilol (COREG) 12.5 MG tablet, TAKE 1 TABLET BY MOUTH  TWICE DAILY WITH A MEAL, Disp: 180 tablet, Rfl: 3 .  Cholecalciferol (VITAMIN D3) 50 MCG (2000 UT) capsule, Take 1 capsule by mouth daily., Disp: , Rfl:  .  doxazosin (CARDURA) 2 MG tablet, Take 2 mg by mouth daily., Disp: , Rfl:  .  fexofenadine (ALLEGRA) 180 MG tablet, Take 180 mg by mouth daily as needed (allergies.). , Disp: , Rfl:  .  insulin lispro protamine-lispro (HUMALOG 50/50 MIX) (50-50) 100 UNIT/ML SUSP injection, Inject 10-15 Units into the skin See admin instructions. Inject 25 units subcutaneously with breakfast and 22 units with supper, Disp: , Rfl:  .  Insulin Syringe-Needle U-100 (EASY TOUCH INSULIN SYRINGE) 30G X 5/16" 0.5 ML MISC, USE 3 TIMES DAILY, Disp: 270 each, Rfl: 3 .  losartan-hydrochlorothiazide (HYZAAR) 100-25 MG tablet, TAKE 1 TABLET  BY MOUTH  DAILY, Disp: 90 tablet, Rfl: 3 .  metFORMIN (GLUCOPHAGE-XR) 500 MG 24 hr tablet, Take 1,000 mg by mouth 2 (two) times daily with a meal. , Disp: , Rfl:  .  TRULICITY 1.82 XH/3.7JI SOPN, SMARTSIG:0.5 Milliliter(s) SUB-Q Once a Week, Disp: , Rfl:    Allergies  Allergen Reactions  . Bidil [Isosorb Dinitrate-Hydralazine] Other (See Comments)    Severe hypotension episode after administering one dose of Bidil 20-37.5 mg on 09/25/2019.   . Statins Other (See Comments)    Leg pain     Review of Systems  Constitutional: Negative.    Eyes: Negative for blurred vision.  Respiratory: Negative.  Negative for shortness of breath.   Cardiovascular: Negative.  Negative for chest pain and palpitations.  Gastrointestinal: Negative.   Psychiatric/Behavioral: Negative.   All other systems reviewed and are negative.    Today's Vitals   10/09/20 0920  BP: 120/74  Pulse: 76  Temp: 97.8 F (36.6 C)  TempSrc: Oral  Weight: 226 lb 3.2 oz (102.6 kg)  Height: 6' 3"  (1.905 m)  PainSc: 0-No pain   Body mass index is 28.27 kg/m.  Wt Readings from Last 3 Encounters:  10/09/20 226 lb 3.2 oz (102.6 kg)  10/01/20 227 lb 12.8 oz (103.3 kg)  06/13/20 230 lb (104.3 kg)   Objective:  Physical Exam Vitals and nursing note reviewed.  Constitutional:      Appearance: Normal appearance. He is obese.  HENT:     Head: Normocephalic and atraumatic.  Cardiovascular:     Rate and Rhythm: Normal rate and regular rhythm.     Heart sounds: Normal heart sounds.  Pulmonary:     Breath sounds: Normal breath sounds.  Musculoskeletal:     Cervical back: Normal range of motion.  Skin:    General: Skin is warm.  Neurological:     General: No focal deficit present.     Mental Status: He is alert and oriented to person, place, and time.         Assessment And Plan:     1. Type 2 diabetes mellitus with other circulatory complications (HCC) Comments: Chronic, he will continue under the care of Dr. Buddy Duty. Previous Endo notes reviewed in full detail.  - BMP8+EGFR - Lipid panel  2. Malignant HTN with heart disease, w/o CHF, with chronic kidney disease Comments: Chronic, controlled. His BP is very labile. He is encouraged to take meds as prescribed and follow low sodium diet.  - BMP8+EGFR - Lipid panel  3. Chronic renal disease, stage II Comments: Chronic, encouraged to stay well hydrated and keep BP controlled in order to prevent progression of CKD.   4. Pure hypercholesterolemia Comments: He has been statin intolerant. I will check  lipid panel. I will make further suggestions once his labs are avail for review.   5. Overweight with body mass index (BMI) of 28 to 28.9 in adult Comments: His BMI is acceptable for his demographic. Advised to aim for at least 150 minutes of exercise per week.   Patient was given opportunity to ask questions. Patient verbalized understanding of the plan and was able to repeat key elements of the plan. All questions were answered to their satisfaction.   I, [name], MD, have reviewed all documentation for this visit. The documentation on 10/12/20 for the exam, diagnosis, procedures, and orders are all accurate and complete.  THE PATIENT IS ENCOURAGED TO PRACTICE SOCIAL DISTANCING DUE TO THE COVID-19 PANDEMIC.

## 2020-10-09 NOTE — Patient Instructions (Signed)
Diabetes Mellitus and Foot Care Foot care is an important part of your health, especially when you have diabetes. Diabetes may cause you to have problems because of poor blood flow (circulation) to your feet and legs, which can cause your skin to:  Become thinner and drier.  Break more easily.  Heal more slowly.  Peel and crack. You may also have nerve damage (neuropathy) in your legs and feet, causing decreased feeling in them. This means that you may not notice minor injuries to your feet that could lead to more serious problems. Noticing and addressing any potential problems early is the best way to prevent future foot problems. How to care for your feet Foot hygiene  Wash your feet daily with warm water and mild soap. Do not use hot water. Then, pat your feet and the areas between your toes until they are completely dry. Do not soak your feet as this can dry your skin.  Trim your toenails straight across. Do not dig under them or around the cuticle. File the edges of your nails with an emery board or nail file.  Apply a moisturizing lotion or petroleum jelly to the skin on your feet and to dry, brittle toenails. Use lotion that does not contain alcohol and is unscented. Do not apply lotion between your toes.   Shoes and socks  Wear clean socks or stockings every day. Make sure they are not too tight. Do not wear knee-high stockings since they may decrease blood flow to your legs.  Wear shoes that fit properly and have enough cushioning. Always look in your shoes before you put them on to be sure there are no objects inside.  To break in new shoes, wear them for just a few hours a day. This prevents injuries on your feet. Wounds, scrapes, corns, and calluses  Check your feet daily for blisters, cuts, bruises, sores, and redness. If you cannot see the bottom of your feet, use a mirror or ask someone for help.  Do not cut corns or calluses or try to remove them with medicine.  If you  find a minor scrape, cut, or break in the skin on your feet, keep it and the skin around it clean and dry. You may clean these areas with mild soap and water. Do not clean the area with peroxide, alcohol, or iodine.  If you have a wound, scrape, corn, or callus on your foot, look at it several times a day to make sure it is healing and not infected. Check for: ? Redness, swelling, or pain. ? Fluid or blood. ? Warmth. ? Pus or a bad smell.   General tips  Do not cross your legs. This may decrease blood flow to your feet.  Do not use heating pads or hot water bottles on your feet. They may burn your skin. If you have lost feeling in your feet or legs, you may not know this is happening until it is too late.  Protect your feet from hot and cold by wearing shoes, such as at the beach or on hot pavement.  Schedule a complete foot exam at least once a year (annually) or more often if you have foot problems. Report any cuts, sores, or bruises to your health care provider immediately. Where to find more information  American Diabetes Association: www.diabetes.org  Association of Diabetes Care & Education Specialists: www.diabeteseducator.org Contact a health care provider if:  You have a medical condition that increases your risk of infection and   you have any cuts, sores, or bruises on your feet.  You have an injury that is not healing.  You have redness on your legs or feet.  You feel burning or tingling in your legs or feet.  You have pain or cramps in your legs and feet.  Your legs or feet are numb.  Your feet always feel cold.  You have pain around any toenails. Get help right away if:  You have a wound, scrape, corn, or callus on your foot and: ? You have pain, swelling, or redness that gets worse. ? You have fluid or blood coming from the wound, scrape, corn, or callus. ? Your wound, scrape, corn, or callus feels warm to the touch. ? You have pus or a bad smell coming from  the wound, scrape, corn, or callus. ? You have a fever. ? You have a red line going up your leg. Summary  Check your feet every day for blisters, cuts, bruises, sores, and redness.  Apply a moisturizing lotion or petroleum jelly to the skin on your feet and to dry, brittle toenails.  Wear shoes that fit properly and have enough cushioning.  If you have foot problems, report any cuts, sores, or bruises to your health care provider immediately.  Schedule a complete foot exam at least once a year (annually) or more often if you have foot problems. This information is not intended to replace advice given to you by your health care provider. Make sure you discuss any questions you have with your health care provider. Document Revised: 02/22/2020 Document Reviewed: 02/22/2020 Elsevier Patient Education  2021 [employer] Inc.  

## 2020-10-10 DIAGNOSIS — I1310 Hypertensive heart and chronic kidney disease without heart failure, with stage 1 through stage 4 chronic kidney disease, or unspecified chronic kidney disease: Secondary | ICD-10-CM | POA: Diagnosis not present

## 2020-10-10 DIAGNOSIS — E1159 Type 2 diabetes mellitus with other circulatory complications: Secondary | ICD-10-CM | POA: Diagnosis not present

## 2020-10-11 ENCOUNTER — Encounter: Payer: Self-pay | Admitting: Internal Medicine

## 2020-10-11 LAB — BMP8+EGFR
BUN/Creatinine Ratio: 16 (ref 10–24)
BUN: 21 mg/dL (ref 8–27)
CO2: 21 mmol/L (ref 20–29)
Calcium: 9.6 mg/dL (ref 8.6–10.2)
Chloride: 103 mmol/L (ref 96–106)
Creatinine, Ser: 1.35 mg/dL — ABNORMAL HIGH (ref 0.76–1.27)
GFR calc Af Amer: 54 mL/min/{1.73_m2} — ABNORMAL LOW (ref >59)
GFR calc non Af Amer: 47 mL/min/{1.73_m2} — ABNORMAL LOW (ref >59)
Glucose: 111 mg/dL — ABNORMAL HIGH (ref 65–99)
Potassium: 4.5 mmol/L (ref 3.5–5.2)
Sodium: 142 mmol/L (ref 134–144)

## 2020-10-11 LAB — LIPID PANEL
Chol/HDL Ratio: 2.4 ratio (ref 0.0–5.0)
Cholesterol, Total: 166 mg/dL (ref 100–199)
HDL: 70 mg/dL (ref >39)
LDL Chol Calc (NIH): 83 mg/dL (ref 0–99)
Triglycerides: 67 mg/dL (ref 0–149)
VLDL Cholesterol Cal: 13 mg/dL (ref 5–40)

## 2020-10-15 DIAGNOSIS — E1165 Type 2 diabetes mellitus with hyperglycemia: Secondary | ICD-10-CM | POA: Diagnosis not present

## 2020-10-15 DIAGNOSIS — E119 Type 2 diabetes mellitus without complications: Secondary | ICD-10-CM | POA: Diagnosis not present

## 2020-10-15 DIAGNOSIS — Z794 Long term (current) use of insulin: Secondary | ICD-10-CM | POA: Diagnosis not present

## 2020-10-21 ENCOUNTER — Ambulatory Visit: Payer: Medicare Other | Admitting: Podiatry

## 2020-10-21 ENCOUNTER — Other Ambulatory Visit: Payer: Self-pay

## 2020-10-21 ENCOUNTER — Encounter: Payer: Self-pay | Admitting: Podiatry

## 2020-10-21 DIAGNOSIS — E669 Obesity, unspecified: Secondary | ICD-10-CM | POA: Insufficient documentation

## 2020-10-21 DIAGNOSIS — Z794 Long term (current) use of insulin: Secondary | ICD-10-CM

## 2020-10-21 DIAGNOSIS — Z8546 Personal history of malignant neoplasm of prostate: Secondary | ICD-10-CM | POA: Insufficient documentation

## 2020-10-21 DIAGNOSIS — B351 Tinea unguium: Secondary | ICD-10-CM

## 2020-10-21 DIAGNOSIS — M79676 Pain in unspecified toe(s): Secondary | ICD-10-CM

## 2020-10-21 DIAGNOSIS — E119 Type 2 diabetes mellitus without complications: Secondary | ICD-10-CM

## 2020-10-21 NOTE — Progress Notes (Signed)
Subjective: Marcus Johnston presents today for follow up of preventative diabetic foot care and painful mycotic nails b/l that are difficult to trim. Pain interferes with ambulation. Aggravating factors include wearing enclosed shoe gear. Pain is relieved with periodic professional debridement.   He voices no new pedal problems on today's visit.  He states his blood glucose was 128 mg/dl this morning.  Dr. Glendale Chard is his PCP. Last visit was 10/09/2020.  Allergies  Allergen Reactions  . Bidil [Isosorb Dinitrate-Hydralazine] Other (See Comments)    Severe hypotension episode after administering one dose of Bidil 20-37.5 mg on 09/25/2019.   . Statins Other (See Comments)    Leg pain    Objective: There were no vitals filed for this visit.  Pt is a pleasant 87 y.o. year old AA male  in NAD. AAO x 3.   Vascular Examination:  Capillary refill time to digits immediate b/l. Palpable DP pulses b/l. Palpable PT pulses b/l. Pedal hair sparse b/l. Skin temperature gradient within normal limits b/l.  Dermatological Examination: Pedal skin with normal turgor, texture and tone bilaterally. No open wounds bilaterally. No interdigital macerations bilaterally. Toenails 1-5 b/l elongated, dystrophic, thickened, crumbly with subungual debris and tenderness to dorsal palpation.  Musculoskeletal: Normal muscle strength 5/5 to all lower extremity muscle groups bilaterally, no gross bony deformities bilaterally and no pain crepitus or joint limitation noted with ROM b/l  Neurological: Protective sensation intact 5/5 intact bilaterally with 10g monofilament b/l. Vibratory sensation intact b/l. Babinski reflex negative b/l. Clonus negative b/l.  Assessment: 1. Pain due to onychomycosis of toenail   2. Type 2 diabetes mellitus without complication, with long-term current use of insulin ([place])    Plan: -Patient examined. No new findings.  No new orders. -Continue diabetic foot care principles.    -Toenails 1-5 b/l were debrided in length and girth with sterile nail nippers and dremel without iatrogenic bleeding.  -Patient to continue soft, supportive shoe gear daily. -Patient to report any pedal injuries to medical professional immediately. -Patient/POA to call should there be question/concern in the interim.  Return in about 9 weeks (around 12/23/2020).

## 2020-12-12 DIAGNOSIS — N1831 Chronic kidney disease, stage 3a: Secondary | ICD-10-CM | POA: Diagnosis not present

## 2020-12-12 DIAGNOSIS — E1122 Type 2 diabetes mellitus with diabetic chronic kidney disease: Secondary | ICD-10-CM | POA: Diagnosis not present

## 2020-12-12 DIAGNOSIS — Z794 Long term (current) use of insulin: Secondary | ICD-10-CM | POA: Diagnosis not present

## 2020-12-12 DIAGNOSIS — I10 Essential (primary) hypertension: Secondary | ICD-10-CM | POA: Diagnosis not present

## 2020-12-24 ENCOUNTER — Other Ambulatory Visit: Payer: Self-pay

## 2020-12-24 ENCOUNTER — Ambulatory Visit: Payer: Medicare Other | Admitting: Podiatry

## 2020-12-24 ENCOUNTER — Encounter: Payer: Self-pay | Admitting: Podiatry

## 2020-12-24 DIAGNOSIS — B351 Tinea unguium: Secondary | ICD-10-CM | POA: Diagnosis not present

## 2020-12-24 DIAGNOSIS — M79676 Pain in unspecified toe(s): Secondary | ICD-10-CM

## 2020-12-24 DIAGNOSIS — E119 Type 2 diabetes mellitus without complications: Secondary | ICD-10-CM

## 2020-12-24 DIAGNOSIS — Z794 Long term (current) use of insulin: Secondary | ICD-10-CM

## 2020-12-29 NOTE — Progress Notes (Signed)
Subjective: Marcus Johnston is a pleasant 87 y.o. male patient seen today painful thick toenails that are difficult to trim. Pain interferes with ambulation. Aggravating factors include wearing enclosed shoe gear. Pain is relieved with periodic professional debridement.  His blood glucose was 120 mg/dl today.   He voices no new pedal problems on today's visit.  PCP is Dr. Glendale Chard and last visit was 10/09/2020.  Allergies  Allergen Reactions  . Bidil [Isosorb Dinitrate-Hydralazine] Other (See Comments)    Severe hypotension episode after administering one dose of Bidil 20-37.5 mg on 09/25/2019.   . Statins Other (See Comments)    Leg pain    Objective: Physical Exam  General: Marcus Johnston is a pleasant 87 y.o. African American male, in NAD. AAO x 3.   Vascular:  Capillary refill time to digits immediate b/l. Palpable pedal pulses b/l LE. Pedal hair present b/l. Lower extremity skin temperature gradient within normal limits. No pain with calf compression b/l. No edema noted b/l lower extremities.   Dermatological:  Pedal skin with normal turgor, texture and tone bilaterally. No open wounds bilaterally. No interdigital macerations bilaterally. Toenails 1-5 b/l elongated, discolored, dystrophic, thickened, crumbly with subungual debris and tenderness to dorsal palpation. No hyperkeratotic nor porokeratotic lesions present on today's visit.  Musculoskeletal:  Normal muscle strength 5/5 to all lower extremity muscle groups bilaterally. No pain crepitus or joint limitation noted with ROM b/l. No gross bony deformities bilaterally.  Neurological:  Protective sensation intact 5/5 intact bilaterally with 10g monofilament b/l. Vibratory sensation intact b/l. Clonus negative b/l.  Assessment and Plan:  1. Pain due to onychomycosis of toenail   2. Type 2 diabetes mellitus without complication, with long-term current use of insulin ([place])      -Examined patient. -Continue diabetic  foot care principles. -Patient to continue soft, supportive shoe gear daily. -Toenails 1-5 b/l were debrided in length and girth with sterile nail nippers and dremel without iatrogenic bleeding.  -Patient to report any pedal injuries to medical professional immediately. -Patient/POA to call should there be question/concern in the interim.  Return in about 9 weeks (around 02/25/2021).  Marzetta Board, DPM

## 2021-01-15 DIAGNOSIS — E1165 Type 2 diabetes mellitus with hyperglycemia: Secondary | ICD-10-CM | POA: Diagnosis not present

## 2021-01-15 DIAGNOSIS — E119 Type 2 diabetes mellitus without complications: Secondary | ICD-10-CM | POA: Diagnosis not present

## 2021-01-15 DIAGNOSIS — Z794 Long term (current) use of insulin: Secondary | ICD-10-CM | POA: Diagnosis not present

## 2021-02-02 IMAGING — MR MR HEAD W/O CM
7 of 11 series · 25 of 48 positions shown · non-contrast
Comparison: MRI/MRA head 09/25/2019.

CLINICAL DATA: Neuro deficit, subacute.

EXAM:
MRI HEAD WITHOUT CONTRAST
TECHNIQUE: Multiplanar, multiecho pulse sequences of the brain and surrounding
structures were obtained without intravenous contrast.

[Series 2: DWI · axial · 3.0mm · 0.94mm/px · z∈[-115,+31]mm · 7 of 100 slices shown (1 of 2)]
[im 1/100]
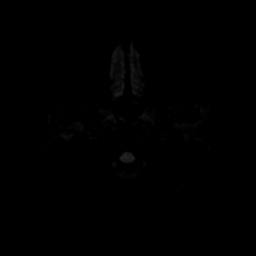
[im 17/100]
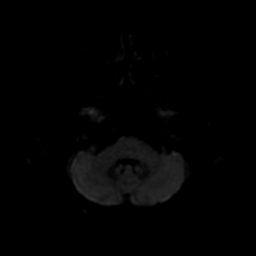
[im 34/100]
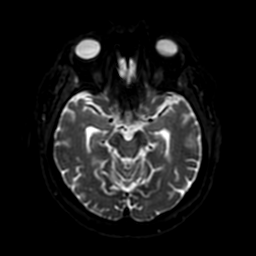
[im 50/100]
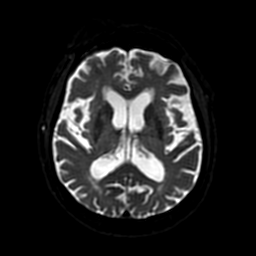
[im 67/100]
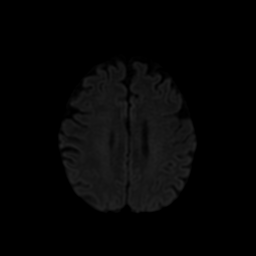
[im 83/100]
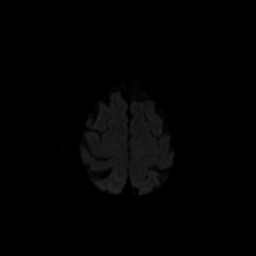
[im 100/100]
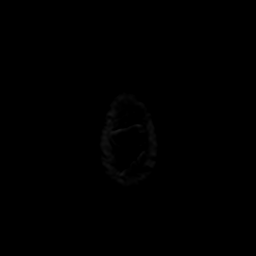

[Series 3: DWI · coronal · 4.0mm · 0.94mm/px · 6 of 74 slices shown (2 of 2)]
[im 1/74]
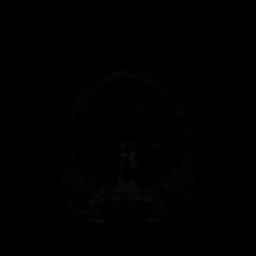
[im 15/74]
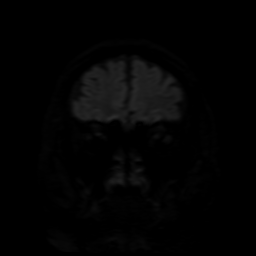
[im 30/74]
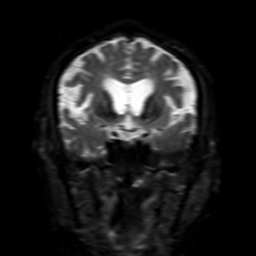
[im 44/74]
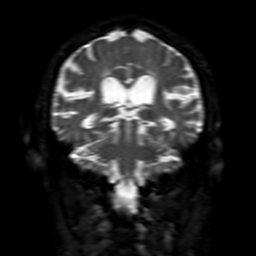
[im 59/74]
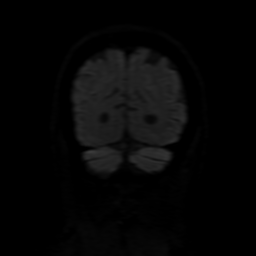
[im 74/74]
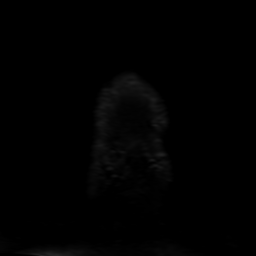

[Series 4: T2 · axial · 5.0mm · 0.23mm/px · 1 of 25 slices shown]
[im 1/25]
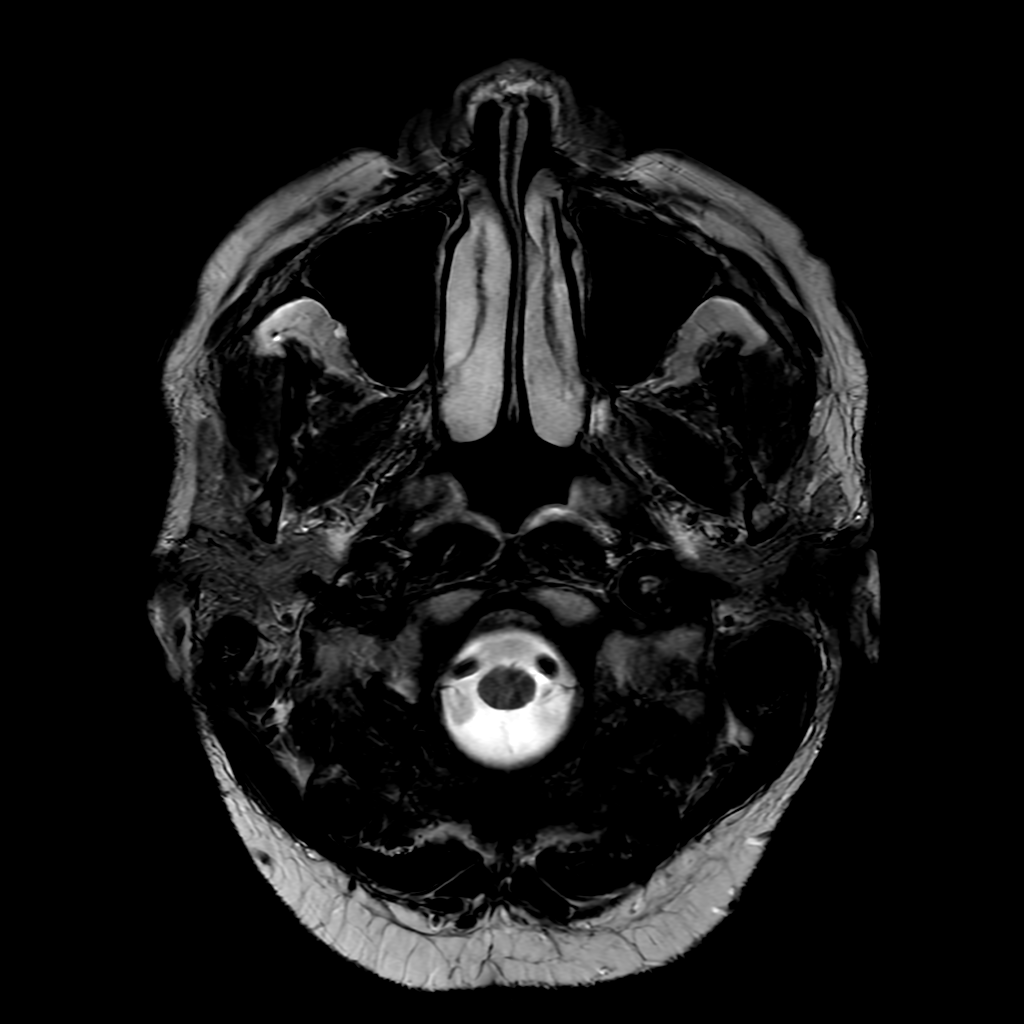

[Series 5: FLAIR · sagittal · 5.0mm · 0.23mm/px · 2 of 25 slices shown (1 of 2)]
[im 1/25]
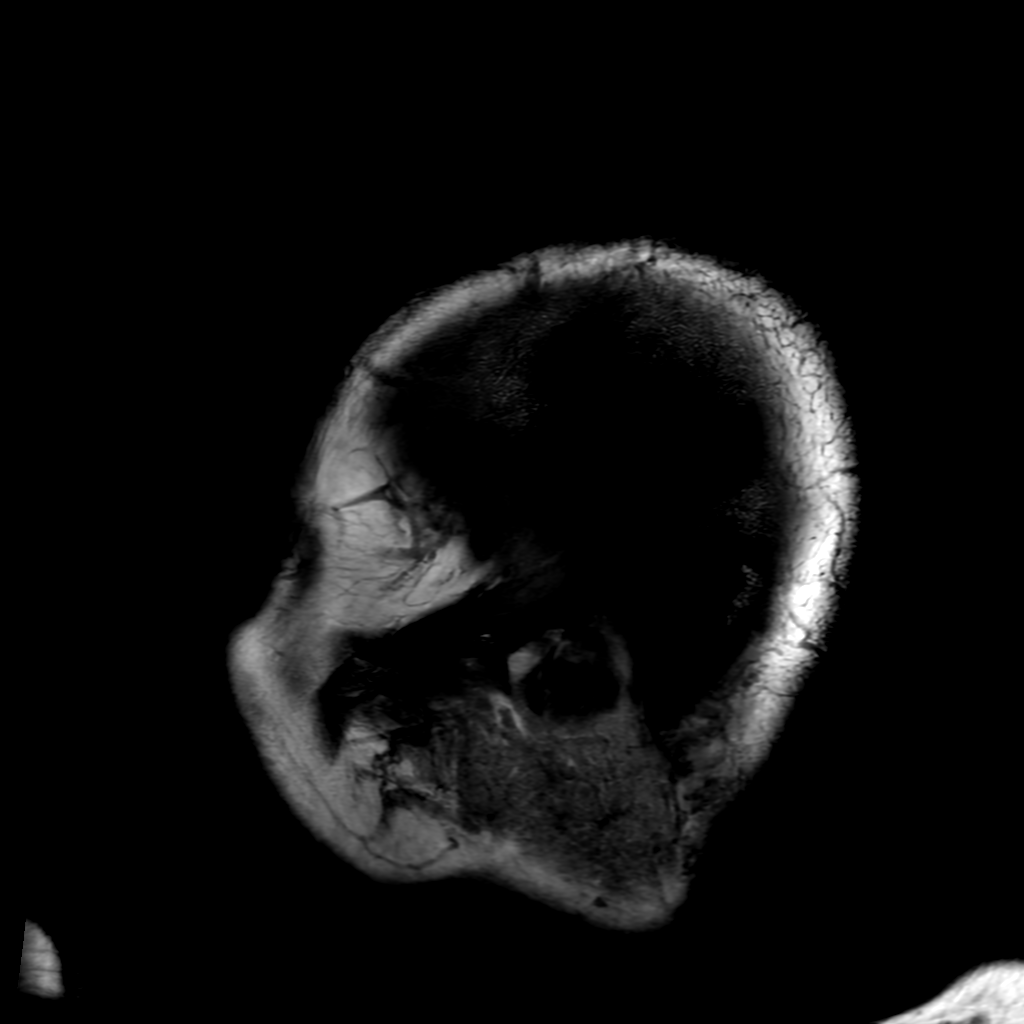
[im 25/25]
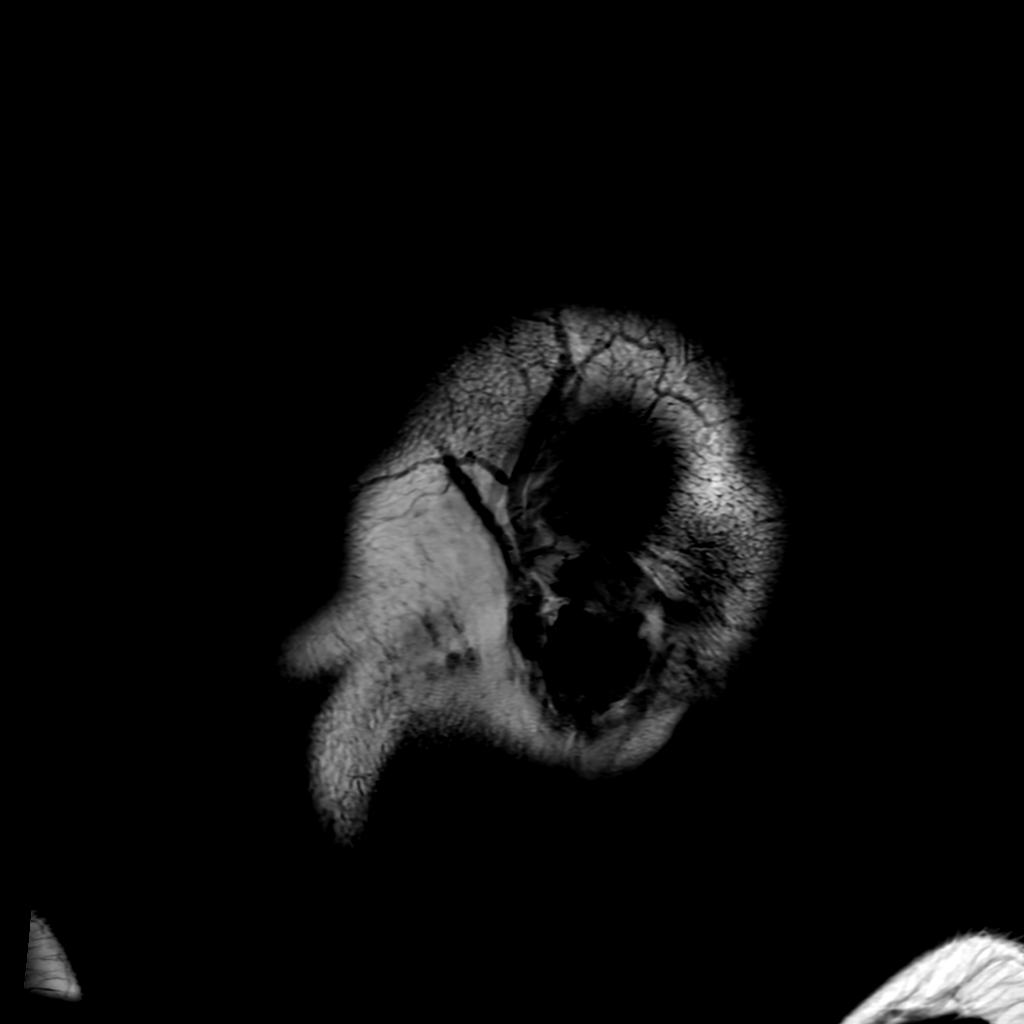

[Series 6: FLAIR · axial · 3.0mm · 0.41mm/px · z∈[-106,+31]mm · 2 of 24 slices shown (2 of 2)]
[im 1/24]
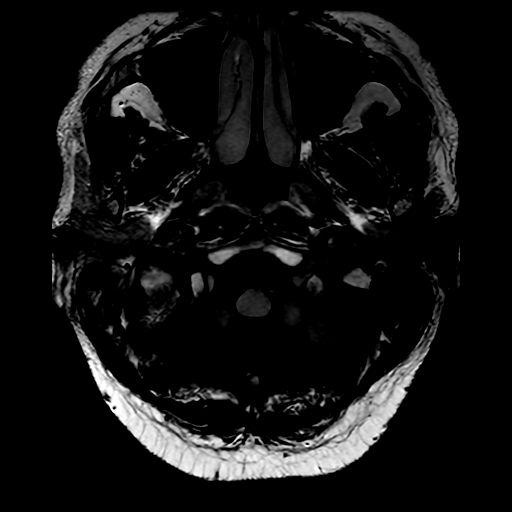
[im 24/24]
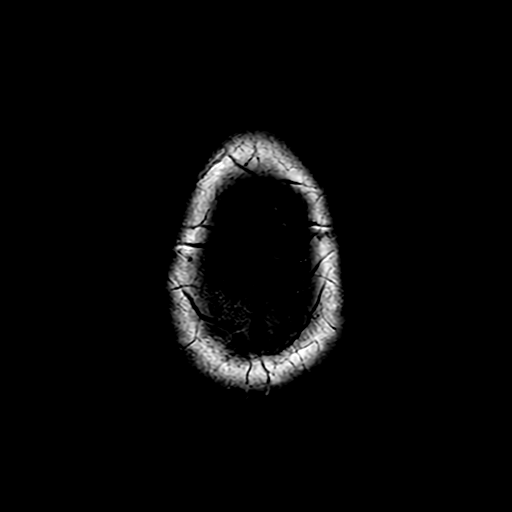

[Series 250: ADC · axial · 3.0mm · 0.94mm/px · z∈[-115,+31]mm · 4 of 50 slices shown (1 of 2)]
[im 1/50]
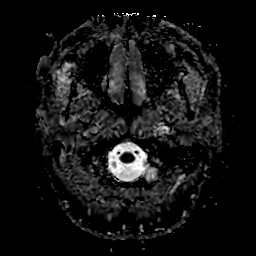
[im 17/50]
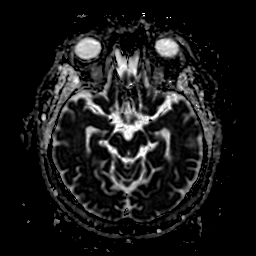
[im 33/50]
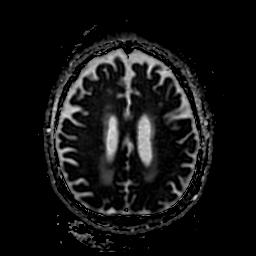
[im 50/50]
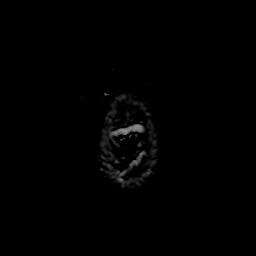

[Series 350: ADC · coronal · 4.0mm · 0.94mm/px · 3 of 37 slices shown (2 of 2)]
[im 1/37]
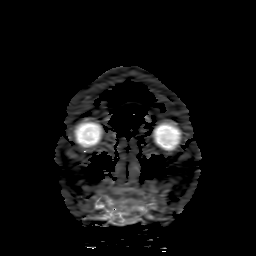
[im 19/37]
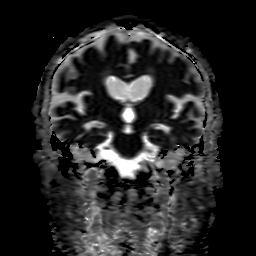
[im 37/37]
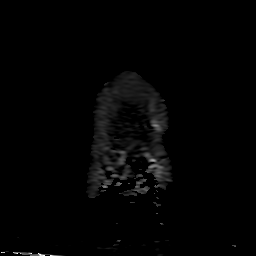

[25 of 48 positions shown; findings below may reference images not displayed]

FINDINGS: Brain:

Stable appearance of moderate scattered T2/FLAIR hyperintensity
within the cerebral white matter which is nonspecific, but
consistent with chronic small vessel ischemic disease.
Redemonstrated chronic right thalamic lacunar infarct. Stable, mild
generalized parenchymal atrophy.

Nonspecific chronic microhemorrhage within the left frontal lobe
(series 7, image 69).

There is no acute infarct.

No evidence of intracranial mass.

No extra-axial fluid collection.

No midline shift.

Vascular: Expected proximal arterial flow voids.

Skull and upper cervical spine: No focal marrow lesion. Incompletely
assessed upper cervical spondylosis.

Sinuses/Orbits: Bilateral lens replacements. No significant
paranasal sinus disease or mastoid effusion at the imaged levels.
IMPRESSION: 1. No evidence of acute intracranial abnormality, including acute
infarction.
2. Mild generalized parenchymal atrophy and moderate chronic small
vessel ischemic disease, stable.
3. Redemonstrated chronic right thalamic lacunar infarct.

## 2021-02-06 ENCOUNTER — Ambulatory Visit (INDEPENDENT_AMBULATORY_CARE_PROVIDER_SITE_OTHER): Payer: Medicare Other | Admitting: Internal Medicine

## 2021-02-06 ENCOUNTER — Encounter: Payer: Self-pay | Admitting: Internal Medicine

## 2021-02-06 ENCOUNTER — Other Ambulatory Visit: Payer: Self-pay

## 2021-02-06 VITALS — BP 120/66 | HR 82 | Temp 98.3°F | Ht 75.0 in | Wt 225.2 lb

## 2021-02-06 DIAGNOSIS — N182 Chronic kidney disease, stage 2 (mild): Secondary | ICD-10-CM | POA: Diagnosis not present

## 2021-02-06 DIAGNOSIS — R432 Parageusia: Secondary | ICD-10-CM | POA: Diagnosis not present

## 2021-02-06 DIAGNOSIS — I1310 Hypertensive heart and chronic kidney disease without heart failure, with stage 1 through stage 4 chronic kidney disease, or unspecified chronic kidney disease: Secondary | ICD-10-CM

## 2021-02-06 DIAGNOSIS — Z79899 Other long term (current) drug therapy: Secondary | ICD-10-CM | POA: Diagnosis not present

## 2021-02-06 DIAGNOSIS — R202 Paresthesia of skin: Secondary | ICD-10-CM | POA: Diagnosis not present

## 2021-02-06 DIAGNOSIS — E1159 Type 2 diabetes mellitus with other circulatory complications: Secondary | ICD-10-CM | POA: Diagnosis not present

## 2021-02-06 NOTE — Patient Instructions (Addendum)
Diabetes Mellitus and Foot Care Foot care is an important part of your health, especially when you have diabetes. Diabetes may cause you to have problems because of poor blood flow (circulation) to your feet and legs, which can cause your skin to: Become thinner and drier. Break more easily. Heal more slowly. Peel and crack. You may also have nerve damage (neuropathy) in your legs and feet, causing decreased feeling in them. This means that you may not notice minor injuries to your feet that could lead to more serious problems. Noticing and addressing any potential problems early is the best wayto prevent future foot problems. How to care for your feet Foot hygiene  Wash your feet daily with warm water and mild soap. Do not use hot water. Then, pat your feet and the areas between your toes until they are completely dry. Do not soak your feet as this can dry your skin. Trim your toenails straight across. Do not dig under them or around the cuticle. File the edges of your nails with an emery board or nail file. Apply a moisturizing lotion or petroleum jelly to the skin on your feet and to dry, brittle toenails. Use lotion that does not contain alcohol and is unscented. Do not apply lotion between your toes.  Shoes and socks Wear clean socks or stockings every day. Make sure they are not too tight. Do not wear knee-high stockings since they may decrease blood flow to your legs. Wear shoes that fit properly and have enough cushioning. Always look in your shoes before you put them on to be sure there are no objects inside. To break in new shoes, wear them for just a few hours a day. This prevents injuries on your feet. Wounds, scrapes, corns, and calluses  Check your feet daily for blisters, cuts, bruises, sores, and redness. If you cannot see the bottom of your feet, use a mirror or ask someone for help. Do not cut corns or calluses or try to remove them with medicine. If you find a minor scrape,  cut, or break in the skin on your feet, keep it and the skin around it clean and dry. You may clean these areas with mild soap and water. Do not clean the area with peroxide, alcohol, or iodine. If you have a wound, scrape, corn, or callus on your foot, look at it several times a day to make sure it is healing and not infected. Check for: Redness, swelling, or pain. Fluid or blood. Warmth. Pus or a bad smell.  General tips Do not cross your legs. This may decrease blood flow to your feet. Do not use heating pads or hot water bottles on your feet. They may burn your skin. If you have lost feeling in your feet or legs, you may not know this is happening until it is too late. Protect your feet from hot and cold by wearing shoes, such as at the beach or on hot pavement. Schedule a complete foot exam at least once a year (annually) or more often if you have foot problems. Report any cuts, sores, or bruises to your health care provider immediately. Where to find more information American Diabetes Association: www.diabetes.org Association of Diabetes Care & Education Specialists: www.diabeteseducator.org Contact a health care provider if: You have a medical condition that increases your risk of infection and you have any cuts, sores, or bruises on your feet. You have an injury that is not healing. You have redness on your legs or feet.  You feel burning or tingling in your legs or feet. You have pain or cramps in your legs and feet. Your legs or feet are numb. Your feet always feel cold. You have pain around any toenails. Get help right away if: You have a wound, scrape, corn, or callus on your foot and: You have pain, swelling, or redness that gets worse. You have fluid or blood coming from the wound, scrape, corn, or callus. Your wound, scrape, corn, or callus feels warm to the touch. You have pus or a bad smell coming from the wound, scrape, corn, or callus. You have a fever. You have a red  line going up your leg. Summary Check your feet every day for blisters, cuts, bruises, sores, and redness. Apply a moisturizing lotion or petroleum jelly to the skin on your feet and to dry, brittle toenails. Wear shoes that fit properly and have enough cushioning. If you have foot problems, report any cuts, sores, or bruises to your health care provider immediately. Schedule a complete foot exam at least once a year (annually) or more often if you have foot problems. This information is not intended to replace advice given to you by your health care provider. Make sure you discuss any questions you have with your healthcare provider. Document Revised: 02/22/2020 Document Reviewed: 02/22/2020 Elsevier Patient Education  [place].  Vitamin B12 Deficiency Vitamin B12 deficiency means that your body does not have enough vitamin B12. The body needs this vitamin: To make red blood cells. To make genes (DNA). To help the nerves work. If you do not have enough vitamin B12 in your body, you can have healthproblems. What are the causes? Not eating enough foods that contain vitamin B12. Not being able to absorb vitamin B12 from the food that you eat. Certain digestive system diseases. A condition in which the body does not make enough of a certain protein, which results in too few red blood cells (pernicious anemia). Having a surgery in which part of the stomach or small intestine is removed. Taking medicines that make it hard for the body to absorb vitamin B12. These medicines include: Heartburn medicines. Some antibiotic medicines. Other medicines that are used to treat certain conditions. What increases the risk? Being older than age 18. Eating a vegetarian or vegan diet, especially while you are pregnant. Eating a poor diet while you are pregnant. Taking certain medicines. Having alcoholism. What are the signs or symptoms? In some cases, there are no symptoms. If the condition  leads to too few blood cells or nerve damage, symptoms can occur, such as: Feeling weak. Feeling tired (fatigued). Not being hungry. Weight loss. A loss of feeling (numbness) or tingling in your hands and feet. Redness and burning of the tongue. Being mixed up (confused) or having memory problems. Sadness (depression). Problems with your senses. This can include color blindness, ringing in the ears, or loss of taste. Watery poop (diarrhea) or trouble pooping (constipation). Trouble walking. If anemia is very bad, symptoms can include: Being short of breath. Being dizzy. Having a very fast heartbeat. How is this treated? Changing the way you eat and drink, such as: Eating more foods that contain vitamin B12. Drinking little or no alcohol. Getting vitamin B12 shots. Taking vitamin B12 supplements. Your doctor will tell you the dose that is best for you. Follow these instructions at home: Eating and drinking  Eat lots of healthy foods that contain vitamin B12. These include: Meats and poultry, such as beef, pork,  chicken, [place], and organ meats, such as liver. Seafood, such as clams, rainbow trout, salmon, tuna, and haddock. Eggs. Cereal and dairy products that have vitamin B12 added to them. Check the label. The items listed above may not be a complete list of what you can eat and drink. Contact a dietitian for more options. General instructions Get any shots as told by your doctor. Take supplements only as told by your doctor. Do not drink alcohol if your doctor tells you not to. In some cases, you may only be asked to limit alcohol use. Keep all follow-up visits as told by your doctor. This is important. Contact a doctor if: Your symptoms come back. Get help right away if: You have trouble breathing. You have a very fast heartbeat. You have chest pain. You get dizzy. You pass out. Summary Vitamin B12 deficiency means that your body is not getting enough vitamin B12. In  some cases, there are no symptoms of this condition. Treatment may include making a change in the way you eat and drink, getting vitamin B12 shots, or taking supplements. Eat lots of healthy foods that contain vitamin B12. This information is not intended to replace advice given to you by your health care provider. Make sure you discuss any questions you have with your healthcare provider. Document Revised: 04/12/2018 Document Reviewed: 04/12/2018 Elsevier Patient Education  2022 [employer].

## 2021-02-06 NOTE — Progress Notes (Signed)
I,Katawbba Wiggins,acting as a Education administrator for [name], MD.,have documented all relevant documentation on the behalf of [name], MD,as directed by  [name], MD while in the presence of [name], MD.  This visit occurred during the SARS-CoV-2 public health emergency.  Safety protocols were in place, including screening questions prior to the visit, additional usage of staff PPE, and extensive cleaning of exam room while observing appropriate contact time as indicated for disinfecting solutions.  Subjective:     Patient ID: Marcus Johnston , male    DOB: 08-Feb-1931 , 87 y.o.   MRN: [id number]   Chief Complaint  Patient presents with   Diabetes   Hypertension     HPI  The patient is here today for a diabetes and blood pressure f/u. He is followed by Dr. Buddy Duty for his diabetes. He thinks his last a1c was slightly above 7.0.  He reports compliance with meds. He denies headaches, chest pain and palpitations.  Hypertension This is a chronic problem. The current episode started more than 1 year ago. The problem has been gradually improving since onset. The problem is uncontrolled. Pertinent negatives include no blurred vision, chest pain, palpitations or shortness of breath. Risk factors for coronary artery disease include diabetes mellitus, dyslipidemia, male gender and sedentary lifestyle. Past treatments include beta blockers.    Past Medical History:  Diagnosis Date   Atrial fibrillation ([place])    Atypical atrial flutter ([place]) 10/17/2018   Diabetes mellitus without complication (HCC)    Diabetic retinopathy (HCC)    NPDR OU   High cholesterol    History of prostate cancer    HTN (hypertension)    Hypertensive retinopathy    OU   Renal insufficiency    Shingles      Family History  Problem Relation Age of Onset   Cancer Mother    Cancer Father      Current Outpatient Medications:    acetaminophen (TYLENOL) 325 MG tablet, Take 650 mg by mouth every 6  (six) hours as needed for mild pain or fever., Disp: , Rfl:    amLODipine (NORVASC) 5 MG tablet, TAKE 1 TABLET BY MOUTH  TWICE DAILY, Disp: 180 tablet, Rfl: 3   Bromfenac Sodium (PROLENSA) 0.07 % SOLN, Place 1 drop into both eyes 4 (four) times daily., Disp: 6 mL, Rfl: 3   carvedilol (COREG) 12.5 MG tablet, TAKE 1 TABLET BY MOUTH  TWICE DAILY WITH A MEAL, Disp: 180 tablet, Rfl: 3   Cholecalciferol (VITAMIN D3) 50 MCG (2000 UT) capsule, Take 1 capsule by mouth daily., Disp: , Rfl:    doxazosin (CARDURA) 2 MG tablet, Take 2 mg by mouth daily., Disp: , Rfl:    fexofenadine (ALLEGRA) 180 MG tablet, Take 180 mg by mouth daily as needed (allergies.). , Disp: , Rfl:    insulin lispro protamine-lispro (HUMALOG 50/50 MIX) (50-50) 100 UNIT/ML SUSP injection, Inject 10-15 Units into the skin See admin instructions. Inject 25 units subcutaneously with breakfast and 22 units with supper, Disp: , Rfl:    Insulin Syringe-Needle U-100 (EASY TOUCH INSULIN SYRINGE) 30G X 5/16" 0.5 ML MISC, USE 3 TIMES DAILY, Disp: 270 each, Rfl: 3   losartan-hydrochlorothiazide (HYZAAR) 100-25 MG tablet, TAKE 1 TABLET BY MOUTH  DAILY, Disp: 90 tablet, Rfl: 3   metFORMIN (GLUCOPHAGE-XR) 500 MG 24 hr tablet, Take 1,000 mg by mouth 2 (two) times daily with a meal. , Disp: , Rfl:    TRULICITY 5.72 IO/0.3TD SOPN, SMARTSIG:0.5  Milliliter(s) SUB-Q Once a Week, Disp: , Rfl:    Allergies  Allergen Reactions   Bidil [Isosorb Dinitrate-Hydralazine] Other (See Comments)    Severe hypotension episode after administering one dose of Bidil 20-37.5 mg on 09/25/2019.    Statins Other (See Comments)    Leg pain     Review of Systems  Constitutional: Negative.   HENT:         He c/o "funny taste" in his mouth. This started at least 2-3 months ago. He has not had any COVID testing. Does not occur all the time. Unsure what could be triggering his sx. Denies feeling sores/ulcers in his mouth.   Eyes:  Negative for blurred vision.  Respiratory:  Negative.  Negative for shortness of breath.   Cardiovascular: Negative.  Negative for chest pain and palpitations.  Gastrointestinal: Negative.   Neurological:  Positive for numbness.       He c/o tingling/numbness in b/l LE. He denies LE weakness. Sx started several weeks ago. He is not sure what could have triggered his sx. Denies feeling "off balance"  Psychiatric/Behavioral: Negative.    All other systems reviewed and are negative.   Today's Vitals   02/06/21 1438  BP: 120/66  Pulse: 82  Temp: 98.3 F (36.8 C)  TempSrc: Oral  Weight: 225 lb 3.2 oz (102.2 kg)  Height: 6' 3"  (1.905 m)  PainSc: 0-No pain   Body mass index is 28.15 kg/m.  Wt Readings from Last 3 Encounters:  02/06/21 225 lb 3.2 oz (102.2 kg)  10/09/20 226 lb 3.2 oz (102.6 kg)  10/01/20 227 lb 12.8 oz (103.3 kg)    BP Readings from Last 3 Encounters:  02/06/21 120/66  10/09/20 120/74  10/01/20 (!) 150/72    Objective:  Physical Exam Vitals and nursing note reviewed.  Constitutional:      Appearance: Normal appearance.  HENT:     Nose:     Comments: Masked     Mouth/Throat:     Comments: Masked  Eyes:     Extraocular Movements: Extraocular movements intact.  Cardiovascular:     Rate and Rhythm: Normal rate and regular rhythm.     Heart sounds: Normal heart sounds.  Pulmonary:     Effort: Pulmonary effort is normal.     Breath sounds: Normal breath sounds.  Skin:    General: Skin is warm.  Neurological:     General: No focal deficit present.     Mental Status: He is alert.  Psychiatric:        Mood and Affect: Mood normal.        Assessment And Plan:     1. Type 2 diabetes mellitus with other circulatory complications (HCC) Comments: Chronic, I will request recent notes/labs from Dr. Buddy Duty. He was congratulated on his glycemic control.   2. Malignant HTN with heart disease, w/o CHF, with chronic kidney disease Comments: Chronic, well controlled. Advised to follow low sodium diet. No med  changes today. Most recent Cardiology note reviewed in full detail.   3. Paresthesia of both lower extremities Comments: Pt advised this may be due to diabetic neuropathy. I will also check vitamin B12, TSH levels today. I will make further recommendations once labs are available.  4. Dysgeusia Comments: HE declines COVID testing. He prefers to use home kit. If persistent, he will consider ENT evaluation.   5. Drug therapy - Vitamin B12 - TSH    Patient was given opportunity to ask questions. Patient verbalized understanding of  the plan and was able to repeat key elements of the plan. All questions were answered to their satisfaction.   I, [name], MD, have reviewed all documentation for this visit. The documentation on 02/08/21 for the exam, diagnosis, procedures, and orders are all accurate and complete.   IF YOU HAVE BEEN REFERRED TO A SPECIALIST, IT MAY TAKE 1-2 WEEKS TO SCHEDULE/PROCESS THE REFERRAL. IF YOU HAVE NOT HEARD FROM US/SPECIALIST IN TWO WEEKS, PLEASE GIVE [place] A CALL AT [phone].   THE PATIENT IS ENCOURAGED TO PRACTICE SOCIAL DISTANCING DUE TO THE COVID-19 PANDEMIC.

## 2021-02-24 ENCOUNTER — Other Ambulatory Visit: Payer: Self-pay | Admitting: Internal Medicine

## 2021-02-24 ENCOUNTER — Other Ambulatory Visit: Payer: Self-pay

## 2021-02-24 ENCOUNTER — Encounter: Payer: Self-pay | Admitting: Podiatry

## 2021-02-24 ENCOUNTER — Ambulatory Visit: Payer: Medicare Other | Admitting: Podiatry

## 2021-02-24 DIAGNOSIS — M79676 Pain in unspecified toe(s): Secondary | ICD-10-CM

## 2021-02-24 DIAGNOSIS — E119 Type 2 diabetes mellitus without complications: Secondary | ICD-10-CM

## 2021-02-24 DIAGNOSIS — B351 Tinea unguium: Secondary | ICD-10-CM

## 2021-02-24 DIAGNOSIS — Z794 Long term (current) use of insulin: Secondary | ICD-10-CM | POA: Diagnosis not present

## 2021-02-26 DIAGNOSIS — N1831 Chronic kidney disease, stage 3a: Secondary | ICD-10-CM | POA: Diagnosis not present

## 2021-02-26 DIAGNOSIS — I10 Essential (primary) hypertension: Secondary | ICD-10-CM | POA: Diagnosis not present

## 2021-02-26 DIAGNOSIS — Z794 Long term (current) use of insulin: Secondary | ICD-10-CM | POA: Diagnosis not present

## 2021-02-26 DIAGNOSIS — E1122 Type 2 diabetes mellitus with diabetic chronic kidney disease: Secondary | ICD-10-CM | POA: Diagnosis not present

## 2021-02-26 NOTE — Progress Notes (Signed)
Subjective: Marcus Johnston is a pleasant 87 y.o. male patient seen today painful thick toenails that are difficult to trim. Pain interferes with ambulation. Aggravating factors include wearing enclosed shoe gear. Pain is relieved with periodic professional debridement.  Patient is diabetic and states his blood glucose was 136 mg/dl today.  PCP is Glendale Chard, MD. Last visit was: 02/06/2021. He is also followed by Dr. Delrae Rend for his diabetes and last visit was 08/05/2020.  He voices no new pedal problems on today's visit.  Allergies  Allergen Reactions   Bidil [Isosorb Dinitrate-Hydralazine] Other (See Comments)    Severe hypotension episode after administering one dose of Bidil 20-37.5 mg on 09/25/2019.    Statins Other (See Comments)    Leg pain   Objective: Physical Exam  General: Marcus Johnston is a pleasant 87 y.o. African American male, in NAD. AAO x 3.   Vascular:  Capillary refill time to digits immediate b/l. Palpable pedal pulses b/l LE. Pedal hair present. Lower extremity skin temperature gradient within normal limits. No pain with calf compression b/l. No edema noted b/l lower extremities.  Dermatological:  Pedal skin with normal turgor, texture and tone b/l lower extremities No open wounds b/l lower extremities No interdigital macerations b/l lower extremities Toenails 1-5 b/l elongated, discolored, dystrophic, thickened, crumbly with subungual debris and tenderness to dorsal palpation.  Musculoskeletal:  Normal muscle strength 5/5 to all lower extremity muscle groups bilaterally. No pain crepitus or joint limitation noted with ROM b/l. No gross bony deformities bilaterally.  Neurological:  Protective sensation intact 5/5 intact bilaterally with 10g monofilament b/l. Vibratory sensation intact b/l. Proprioception intact bilaterally.  Assessment and Plan:  1. Pain due to onychomycosis of toenail   2. Type 2 diabetes mellitus without complication, with  long-term current use of insulin ([place])      -Continue diabetic foot care principles. -Patient to continue soft, supportive shoe gear daily. -Toenails 1-5 b/l were debrided in length and girth with sterile nail nippers and dremel without iatrogenic bleeding.  -Patient to report any pedal injuries to medical professional immediately. -Patient/POA to call should there be question/concern in the interim.  Return in about 9 weeks (around 04/28/2021).  Marzetta Board, DPM

## 2021-04-16 ENCOUNTER — Other Ambulatory Visit: Payer: Self-pay

## 2021-04-16 ENCOUNTER — Encounter: Payer: Self-pay | Admitting: Internal Medicine

## 2021-04-16 ENCOUNTER — Ambulatory Visit (INDEPENDENT_AMBULATORY_CARE_PROVIDER_SITE_OTHER): Payer: Medicare Other | Admitting: Internal Medicine

## 2021-04-16 VITALS — BP 122/60 | HR 79 | Temp 98.1°F | Ht 75.0 in | Wt 226.0 lb

## 2021-04-16 DIAGNOSIS — Z79899 Other long term (current) drug therapy: Secondary | ICD-10-CM | POA: Diagnosis not present

## 2021-04-16 DIAGNOSIS — R351 Nocturia: Secondary | ICD-10-CM

## 2021-04-16 DIAGNOSIS — E7800 Pure hypercholesterolemia, unspecified: Secondary | ICD-10-CM

## 2021-04-16 DIAGNOSIS — E785 Hyperlipidemia, unspecified: Secondary | ICD-10-CM | POA: Diagnosis not present

## 2021-04-16 DIAGNOSIS — Z0000 Encounter for general adult medical examination without abnormal findings: Secondary | ICD-10-CM

## 2021-04-16 DIAGNOSIS — I1310 Hypertensive heart and chronic kidney disease without heart failure, with stage 1 through stage 4 chronic kidney disease, or unspecified chronic kidney disease: Secondary | ICD-10-CM

## 2021-04-16 DIAGNOSIS — N1831 Chronic kidney disease, stage 3a: Secondary | ICD-10-CM

## 2021-04-16 DIAGNOSIS — E1159 Type 2 diabetes mellitus with other circulatory complications: Secondary | ICD-10-CM

## 2021-04-16 DIAGNOSIS — Z23 Encounter for immunization: Secondary | ICD-10-CM

## 2021-04-16 LAB — POCT URINALYSIS DIPSTICK
Bilirubin, UA: NEGATIVE
Glucose, UA: POSITIVE — AB
Ketones, UA: NEGATIVE
Leukocytes, UA: NEGATIVE
Nitrite, UA: NEGATIVE
Protein, UA: POSITIVE — AB
Spec Grav, UA: 1.02 (ref 1.010–1.025)
Urobilinogen, UA: 0.2 E.U./dL
pH, UA: 5.5 (ref 5.0–8.0)

## 2021-04-16 NOTE — Patient Instructions (Signed)
Health Maintenance, Male Adopting a healthy lifestyle and getting preventive care are important in promoting health and wellness. Ask your health care provider about: The right schedule for you to have regular tests and exams. Things you can do on your own to prevent diseases and keep yourself healthy. What should I know about diet, weight, and exercise? Eat a healthy diet  Eat a diet that includes plenty of vegetables, fruits, low-fat dairy products, and lean protein. Do not eat a lot of foods that are high in solid fats, added sugars, or sodium. Maintain a healthy weight Body mass index (BMI) is a measurement that can be used to identify possible weight problems. It estimates body fat based on height and weight. Your health care provider can help determine your BMI and help you achieve or maintain a healthy weight. Get regular exercise Get regular exercise. This is one of the most important things you can do for your health. Most adults should: Exercise for at least 150 minutes each week. The exercise should increase your heart rate and make you sweat (moderate-intensity exercise). Do strengthening exercises at least twice a week. This is in addition to the moderate-intensity exercise. Spend less time sitting. Even light physical activity can be beneficial. Watch cholesterol and blood lipids Have your blood tested for lipids and cholesterol at 85 years of age, then have this test every 5 years. You may need to have your cholesterol levels checked more often if: Your lipid or cholesterol levels are high. You are older than 85 years of age. You are at high risk for heart disease. What should I know about cancer screening? Many types of cancers can be detected early and may often be prevented. Depending on your health history and family history, you may need to have cancer screening at various ages. This may include screening for: Colorectal cancer. Prostate cancer. Skin cancer. Lung  cancer. What should I know about heart disease, diabetes, and high blood pressure? Blood pressure and heart disease High blood pressure causes heart disease and increases the risk of stroke. This is more likely to develop in people who have high blood pressure readings, are of African descent, or are overweight. Talk with your health care provider about your target blood pressure readings. Have your blood pressure checked: Every 3-5 years if you are 18-39 years of age. Every year if you are 40 years old or older. If you are between the ages of 65 and 75 and are a current or former smoker, ask your health care provider if you should have a one-time screening for abdominal aortic aneurysm (AAA). Diabetes Have regular diabetes screenings. This checks your fasting blood sugar level. Have the screening done: Once every three years after age 45 if you are at a normal weight and have a low risk for diabetes. More often and at a younger age if you are overweight or have a high risk for diabetes. What should I know about preventing infection? Hepatitis B If you have a higher risk for hepatitis B, you should be screened for this virus. Talk with your health care provider to find out if you are at risk for hepatitis B infection. Hepatitis C Blood testing is recommended for: Everyone born from 1945 through 1965. Anyone with known risk factors for hepatitis C. Sexually transmitted infections (STIs) You should be screened each year for STIs, including gonorrhea and chlamydia, if: You are sexually active and are younger than 85 years of age. You are older than 85 years   of age and your health care provider tells you that you are at risk for this type of infection. Your sexual activity has changed since you were last screened, and you are at increased risk for chlamydia or gonorrhea. Ask your health care provider if you are at risk. Ask your health care provider about whether you are at high risk for HIV.  Your health care provider may recommend a prescription medicine to help prevent HIV infection. If you choose to take medicine to prevent HIV, you should first get tested for HIV. You should then be tested every 3 months for as long as you are taking the medicine. Follow these instructions at home: Lifestyle Do not use any products that contain nicotine or tobacco, such as cigarettes, e-cigarettes, and chewing tobacco. If you need help quitting, ask your health care provider. Do not use street drugs. Do not share needles. Ask your health care provider for help if you need support or information about quitting drugs. Alcohol use Do not drink alcohol if your health care provider tells you not to drink. If you drink alcohol: Limit how much you have to 0-2 drinks a day. Be aware of how much alcohol is in your drink. In the [place] one drink equals one 12 oz bottle of beer (355 mL), one 5 oz glass of wine (148 mL), or one 1 oz glass of hard liquor (44 mL). General instructions Schedule regular health, dental, and eye exams. Stay current with your vaccines. Tell your health care provider if: You often feel depressed. You have ever been abused or do not feel safe at home. Summary Adopting a healthy lifestyle and getting preventive care are important in promoting health and wellness. Follow your health care provider's instructions about healthy diet, exercising, and getting tested or screened for diseases. Follow your health care provider's instructions on monitoring your cholesterol and blood pressure. This information is not intended to replace advice given to you by your health care provider. Make sure you discuss any questions you have with your health care provider. Document Revised: 10/11/2020 Document Reviewed: 07/27/2018 [employer] Patient Education  2022 [employer]  

## 2021-04-16 NOTE — Progress Notes (Signed)
I,Yamilka [employer] as a Education administrator for [name], MD.,have documented all relevant documentation on the behalf of [name], MD,as directed by  [name], MD while in the presence of [name], MD.  This visit occurred during the SARS-CoV-2 public health emergency.  Safety protocols were in place, including screening questions prior to the visit, additional usage of staff PPE, and extensive cleaning of exam room while observing appropriate contact time as indicated for disinfecting solutions.  Subjective:     Patient ID: Marcus Johnston , male    DOB: October 19, 1930 , 87 y.o.   MRN: [id number]   Chief Complaint  Patient presents with   Annual Exam   Diabetes   Hypertension    HPI  Pt presents for full physical exam. He states that he is compliant with medication. He is followed by Dr [name] as his cardiologist. He does not have any complaints at this time.   Diabetes He presents for his follow-up diabetic visit. He has type 2 diabetes mellitus. There are no hypoglycemic associated symptoms. Pertinent negatives for diabetes include no blurred vision and no chest pain. There are no hypoglycemic complications. Risk factors for coronary artery disease include diabetes mellitus, dyslipidemia, hypertension and male sex. He is compliant with treatment some of the time. He is following a diabetic diet. He participates in exercise intermittently. An ACE inhibitor/angiotensin II receptor blocker is being taken.  Hypertension This is a chronic problem. The current episode started more than 1 year ago. The problem has been gradually improving since onset. The problem is controlled. Pertinent negatives include no blurred vision, chest pain, palpitations or shortness of breath. Risk factors for coronary artery disease include diabetes mellitus, dyslipidemia, male gender and sedentary lifestyle. There are no compliance problems.     Past Medical History:  Diagnosis Date    Atrial fibrillation ([place])    Atypical atrial flutter ([place]) 10/17/2018   Diabetes mellitus without complication (HCC)    Diabetic retinopathy (HCC)    NPDR OU   High cholesterol    History of prostate cancer    HTN (hypertension)    Hypertensive retinopathy    OU   Renal insufficiency    Shingles      Family History  Problem Relation Age of Onset   Cancer Mother    Cancer Father      Current Outpatient Medications:    acetaminophen (TYLENOL) 325 MG tablet, Take 650 mg by mouth every 6 (six) hours as needed for mild pain or fever., Disp: , Rfl:    amLODipine (NORVASC) 5 MG tablet, TAKE 1 TABLET BY MOUTH  TWICE DAILY, Disp: 180 tablet, Rfl: 3   carvedilol (COREG) 12.5 MG tablet, TAKE 1 TABLET BY MOUTH  TWICE DAILY WITH A MEAL, Disp: 180 tablet, Rfl: 3   Cholecalciferol (VITAMIN D3) 50 MCG (2000 UT) capsule, Take 1 capsule by mouth daily., Disp: , Rfl:    doxazosin (CARDURA) 2 MG tablet, Take 2 mg by mouth daily., Disp: , Rfl:    fexofenadine (ALLEGRA) 180 MG tablet, Take 180 mg by mouth daily as needed (allergies.). , Disp: , Rfl:    insulin lispro protamine-lispro (HUMALOG 50/50 MIX) (50-50) 100 UNIT/ML SUSP injection, Inject 10-15 Units into the skin See admin instructions. Inject 25 units subcutaneously with breakfast and 22 units with supper, Disp: , Rfl:    Insulin Syringe-Needle U-100 (EASY TOUCH INSULIN SYRINGE) 30G X 5/16" 0.5 ML MISC, USE 3 TIMES DAILY, Disp: 270 each, Rfl:  3   losartan-hydrochlorothiazide (HYZAAR) 100-25 MG tablet, TAKE 1 TABLET BY MOUTH  DAILY, Disp: 90 tablet, Rfl: 3   metFORMIN (GLUCOPHAGE-XR) 500 MG 24 hr tablet, Take 1,000 mg by mouth 2 (two) times daily with a meal. , Disp: , Rfl:    TRULICITY 0.75 MG/0.5ML SOPN, SMARTSIG:0.5 Milliliter(s) SUB-Q Once a Week, Disp: , Rfl:    Allergies  Allergen Reactions   Bidil [Isosorb Dinitrate-Hydralazine] Other (See Comments)    Severe hypotension episode after administering one dose of Bidil 20-37.5 mg on  09/25/2019.    Statins Other (See Comments)    Leg pain     Men's preventive visit. Patient Health Questionnaire (PHQ-2) is  Flowsheet Row Office Visit from 04/16/2021 in [facility]  PHQ-2 Total Score 0     . Patient is on a diabetic diet. Marital status: Married. Relevant history for alcohol use is:  Social History   Substance and Sexual Activity  Alcohol Use No  . Relevant history for tobacco use is:  Social History   Tobacco Use  Smoking Status Never  Smokeless Tobacco Never  .   Review of Systems  Constitutional: Negative.   HENT: Negative.    Eyes: Negative.  Negative for blurred vision.  Respiratory: Negative.  Negative for shortness of breath.   Cardiovascular: Negative.  Negative for chest pain and palpitations.  Gastrointestinal: Negative.   Endocrine: Negative.   Genitourinary: Negative.   Musculoskeletal: Negative.   Skin: Negative.   Allergic/Immunologic: Negative.   Neurological: Negative.   Hematological: Negative.   Psychiatric/Behavioral: Negative.      Today's Vitals   04/16/21 0907  BP: 122/60  Pulse: 79  Temp: 98.1 F (36.7 C)  Weight: 226 lb (102.5 kg)  Height: 6\' 3"  (1.905 m)  PainSc: 0-No pain   Body mass index is 28.25 kg/m.  Wt Readings from Last 3 Encounters:  04/16/21 226 lb (102.5 kg)  02/06/21 225 lb 3.2 oz (102.2 kg)  10/09/20 226 lb 3.2 oz (102.6 kg)     Objective:  Physical Exam Vitals and nursing note reviewed.  Constitutional:      Appearance: Normal appearance.  HENT:     Head: Normocephalic and atraumatic.     Right Ear: Tympanic membrane, ear canal and external ear normal.     Left Ear: Tympanic membrane, ear canal and external ear normal.     Nose:     Comments: Masked     Mouth/Throat:     Comments: Masked  Eyes:     Extraocular Movements: Extraocular movements intact.     Conjunctiva/sclera: Conjunctivae normal.     Pupils: Pupils are equal, round, and reactive to light.   Cardiovascular:     Rate and Rhythm: Normal rate and regular rhythm.     Pulses:          Dorsalis pedis pulses are 2+ on the right side and 2+ on the left side.     Heart sounds: Normal heart sounds.  Pulmonary:     Effort: Pulmonary effort is normal.     Breath sounds: Normal breath sounds.  Chest:  Breasts:    Right: Normal. No swelling, bleeding, inverted nipple, mass or nipple discharge.     Left: Normal. No swelling, bleeding, inverted nipple, mass or nipple discharge.  Abdominal:     General: Abdomen is flat. Bowel sounds are normal.     Palpations: Abdomen is soft.  Genitourinary:    Comments: Deferred as per patient Musculoskeletal:  General: Normal range of motion.     Cervical back: Normal range of motion and neck supple.  Feet:     Right foot:     Protective Sensation: 5 sites tested.  5 sites sensed.     Skin integrity: Callus and dry skin present.     Toenail Condition: Right toenails are long.     Left foot:     Protective Sensation: 5 sites tested.  5 sites sensed.     Skin integrity: Callus and dry skin present.     Toenail Condition: Left toenails are long.  Skin:    General: Skin is warm.  Neurological:     General: No focal deficit present.     Mental Status: He is alert.  Psychiatric:        Mood and Affect: Mood normal.        Behavior: Behavior normal.        Assessment And Plan:    1. Encounter for general adult medical examination w/o abnormal findings Comments: A full exam was performed. DRE deferred. PATIENT IS ADVISED TO GET 30-45 MINUTES REGULAR EXERCISE NO LESS THAN FOUR TO FIVE DAYS PER WEEK - BOTH WEIGHTBEARING EXERCISES AND AEROBIC ARE RECOMMENDED.  PATIENT IS ADVISED TO FOLLOW A HEALTHY DIET WITH AT LEAST SIX FRUITS/VEGGIES PER DAY, DECREASE INTAKE OF RED MEAT, AND TO INCREASE FISH INTAKE TO TWO DAYS PER WEEK.  MEATS/FISH SHOULD NOT BE FRIED, BAKED OR BROILED IS PREFERABLE.  IT IS ALSO IMPORTANT TO CUT BACK ON YOUR SUGAR INTAKE.  PLEASE AVOID ANYTHING WITH ADDED SUGAR, CORN SYRUP OR OTHER SWEETENERS. IF YOU MUST USE A SWEETENER, YOU CAN TRY STEVIA. IT IS ALSO IMPORTANT TO AVOID ARTIFICIALLY SWEETENERS AND DIET BEVERAGES. LASTLY, I SUGGEST WEARING SPF 50 SUNSCREEN ON EXPOSED PARTS AND ESPECIALLY WHEN IN THE DIRECT SUNLIGHT FOR AN EXTENDED PERIOD OF TIME.  PLEASE AVOID FAST FOOD RESTAURANTS AND INCREASE YOUR WATER INTAKE.   2. Type 2 diabetes mellitus with other circulatory complications ([place]) Comments: Diabetic foot exam was performed. I DISCUSSED WITH THE PATIENT AT LENGTH REGARDING THE GOALS OF GLYCEMIC CONTROL AND POSSIBLE LONG-TERM COMPLICATIONS.  I  ALSO STRESSED THE IMPORTANCE OF COMPLIANCE WITH HOME GLUCOSE MONITORING, DIETARY RESTRICTIONS INCLUDING AVOIDANCE OF SUGARY DRINKS/PROCESSED FOODS,  ALONG WITH REGULAR EXERCISE.  I  ALSO STRESSED THE IMPORTANCE OF ANNUAL EYE EXAMS, SELF FOOT CARE AND COMPLIANCE WITH OFFICE VISITS.  - POCT Urinalysis Dipstick (81002) - Microalbumin / Creatinine Urine Ratio - CMP14+EGFR - Hemoglobin A1c  3. Malignant HTN with heart disease, w/o CHF, with chronic kidney disease Comments: Chronic, well controlled. No med changes today. Feb 2022 Cardiology note reviewed.  EKG performed, NSR w/ RAE, Low voltage with rightward P-axis and rotation.  He is encouraged to follow low sodium diet.  - EKG 12-Lead - CBC no Diff  4. Stage 3a chronic kidney disease ([place]) Comments: Chronic, I will check renal labs as listed below. Encouraged to keep BP/BS controlled and stay hydrated to decrease risk of progression.  - Protein electrophoresis, serum - Parathyroid Hormone, Intact w/Ca - Phosphorus  5. Pure hypercholesterolemia Comments: Not currently on statin, will check lipid panel. He has been intolerant in the past. Will consider once weekly statin use OR lipid clinic eval.  - Lipid panel  6. Nocturia Comments: I will check PSA per request. DRE not performed.   7. Encounter for long-term  (current) use of medications  Patient was given opportunity to ask questions. Patient verbalized understanding of the plan and  was able to repeat key elements of the plan. All questions were answered to their satisfaction.   I, [name], MD, have reviewed all documentation for this visit. The documentation on 04/27/21 for the exam, diagnosis, procedures, and orders are all accurate and complete.   THE PATIENT IS ENCOURAGED TO PRACTICE SOCIAL DISTANCING DUE TO THE COVID-19 PANDEMIC.

## 2021-04-18 LAB — PHOSPHORUS: Phosphorus: 3.4 mg/dL (ref 2.8–4.1)

## 2021-04-18 LAB — CBC
Hematocrit: 40.7 % (ref 37.5–51.0)
Hemoglobin: 13.6 g/dL (ref 13.0–17.7)
MCH: 27.5 pg (ref 26.6–33.0)
MCHC: 33.4 g/dL (ref 31.5–35.7)
MCV: 82 fL (ref 79–97)
Platelets: 161 10*3/uL (ref 150–450)
RBC: 4.95 x10E6/uL (ref 4.14–5.80)
RDW: 13.1 % (ref 11.6–15.4)
WBC: 4.1 10*3/uL (ref 3.4–10.8)

## 2021-04-18 LAB — MICROALBUMIN / CREATININE URINE RATIO
Creatinine, Urine: 135.2 mg/dL
Microalb/Creat Ratio: 209 mg/g creat — ABNORMAL HIGH (ref 0–29)
Microalbumin, Urine: 281.9 ug/mL

## 2021-04-18 LAB — PTH, INTACT AND CALCIUM: PTH: 41 pg/mL (ref 15–65)

## 2021-04-18 LAB — PROTEIN ELECTROPHORESIS, SERUM
A/G Ratio: 1.4 (ref 0.7–1.7)
Albumin ELP: 4.1 g/dL (ref 2.9–4.4)
Alpha 1: 0.2 g/dL (ref 0.0–0.4)
Alpha 2: 0.7 g/dL (ref 0.4–1.0)
Beta: 0.9 g/dL (ref 0.7–1.3)
Gamma Globulin: 1.1 g/dL (ref 0.4–1.8)
Globulin, Total: 2.9 g/dL (ref 2.2–3.9)

## 2021-04-18 LAB — CMP14+EGFR
ALT: 6 IU/L (ref 0–44)
AST: 12 IU/L (ref 0–40)
Albumin/Globulin Ratio: 1.6 (ref 1.2–2.2)
Albumin: 4.3 g/dL (ref 3.6–4.6)
Alkaline Phosphatase: 80 IU/L (ref 44–121)
BUN/Creatinine Ratio: 13 (ref 10–24)
BUN: 17 mg/dL (ref 8–27)
Bilirubin Total: 0.4 mg/dL (ref 0.0–1.2)
CO2: 24 mmol/L (ref 20–29)
Calcium: 9.3 mg/dL (ref 8.6–10.2)
Chloride: 102 mmol/L (ref 96–106)
Creatinine, Ser: 1.32 mg/dL — ABNORMAL HIGH (ref 0.76–1.27)
Globulin, Total: 2.7 g/dL (ref 1.5–4.5)
Glucose: 151 mg/dL — ABNORMAL HIGH (ref 65–99)
Potassium: 4.4 mmol/L (ref 3.5–5.2)
Sodium: 140 mmol/L (ref 134–144)
Total Protein: 7 g/dL (ref 6.0–8.5)
eGFR: 52 mL/min/{1.73_m2} — ABNORMAL LOW (ref >59)

## 2021-04-18 LAB — HEMOGLOBIN A1C
Est. average glucose Bld gHb Est-mCnc: 154 mg/dL
Hgb A1c MFr Bld: 7 % — ABNORMAL HIGH (ref 4.8–5.6)

## 2021-04-23 ENCOUNTER — Encounter: Payer: Self-pay | Admitting: Internal Medicine

## 2021-04-23 LAB — LIPID PANEL
Chol/HDL Ratio: 2.3 ratio (ref 0.0–5.0)
Cholesterol, Total: 173 mg/dL (ref 100–199)
HDL: 74 mg/dL (ref >39)
LDL Chol Calc (NIH): 86 mg/dL (ref 0–99)
Triglycerides: 66 mg/dL (ref 0–149)
VLDL Cholesterol Cal: 13 mg/dL (ref 5–40)

## 2021-04-23 LAB — SPECIMEN STATUS REPORT

## 2021-04-28 DIAGNOSIS — E119 Type 2 diabetes mellitus without complications: Secondary | ICD-10-CM | POA: Diagnosis not present

## 2021-04-28 DIAGNOSIS — E1165 Type 2 diabetes mellitus with hyperglycemia: Secondary | ICD-10-CM | POA: Diagnosis not present

## 2021-04-28 DIAGNOSIS — Z794 Long term (current) use of insulin: Secondary | ICD-10-CM | POA: Diagnosis not present

## 2021-04-29 ENCOUNTER — Ambulatory Visit: Payer: Medicare Other | Admitting: Podiatry

## 2021-04-29 ENCOUNTER — Encounter: Payer: Self-pay | Admitting: Podiatry

## 2021-04-29 ENCOUNTER — Other Ambulatory Visit: Payer: Self-pay

## 2021-04-29 DIAGNOSIS — Z794 Long term (current) use of insulin: Secondary | ICD-10-CM | POA: Diagnosis not present

## 2021-04-29 DIAGNOSIS — B351 Tinea unguium: Secondary | ICD-10-CM

## 2021-04-29 DIAGNOSIS — M79676 Pain in unspecified toe(s): Secondary | ICD-10-CM | POA: Diagnosis not present

## 2021-04-29 DIAGNOSIS — E119 Type 2 diabetes mellitus without complications: Secondary | ICD-10-CM | POA: Diagnosis not present

## 2021-04-30 ENCOUNTER — Ambulatory Visit: Payer: Medicare Other | Admitting: Podiatry

## 2021-05-01 ENCOUNTER — Ambulatory Visit (INDEPENDENT_AMBULATORY_CARE_PROVIDER_SITE_OTHER): Payer: Medicare Other

## 2021-05-01 ENCOUNTER — Other Ambulatory Visit: Payer: Self-pay

## 2021-05-01 VITALS — BP 124/76 | HR 82 | Temp 98.3°F | Ht 75.0 in | Wt 225.2 lb

## 2021-05-01 DIAGNOSIS — Z0000 Encounter for general adult medical examination without abnormal findings: Secondary | ICD-10-CM | POA: Diagnosis not present

## 2021-05-01 DIAGNOSIS — Z23 Encounter for immunization: Secondary | ICD-10-CM

## 2021-05-01 NOTE — Patient Instructions (Signed)
Marcus Johnston , Thank you for taking time to come for your Medicare Wellness Visit. I appreciate your ongoing commitment to your health goals. Please review the following plan we discussed and let me know if I can assist you in the future.   Screening recommendations/referrals: Colonoscopy: not required Recommended yearly ophthalmology/optometry visit for glaucoma screening and checkup Recommended yearly dental visit for hygiene and checkup  Vaccinations: Influenza vaccine: today Pneumococcal vaccine: due Tdap vaccine: 07/21/2011, due 07/20/2021 Shingles vaccine: discussed   Covid-19: 07/08/2020, 10/13/2019, 09/22/2019  Advanced directives: Advance directive discussed with you today. Even though you declined this today please call our office should you change your mind and we can give you the proper paperwork for you to fill out.  Conditions/risks identified: none  Next appointment: Follow up in one year for your annual wellness visit.   Preventive Care 85 Years and Older, Male Preventive care refers to lifestyle choices and visits with your health care provider that can promote health and wellness. What does preventive care include? A yearly physical exam. This is also called an annual well check. Dental exams once or twice a year. Routine eye exams. Ask your health care provider how often you should have your eyes checked. Personal lifestyle choices, including: Daily care of your teeth and gums. Regular physical activity. Eating a healthy diet. Avoiding tobacco and drug use. Limiting alcohol use. Practicing safe sex. Taking low doses of aspirin every day. Taking vitamin and mineral supplements as recommended by your health care provider. What happens during an annual well check? The services and screenings done by your health care provider during your annual well check will depend on your age, overall health, lifestyle risk factors, and family history of disease. Counseling  Your  health care provider may ask you questions about your: Alcohol use. Tobacco use. Drug use. Emotional well-being. Home and relationship well-being. Sexual activity. Eating habits. History of falls. Memory and ability to understand (cognition). Work and work Statistician. Screening  You may have the following tests or measurements: Height, weight, and BMI. Blood pressure. Lipid and cholesterol levels. These may be checked every 5 years, or more frequently if you are over 85 years old. Skin check. Lung cancer screening. You may have this screening every year starting at age 85 if you have a 30-pack-year history of smoking and currently smoke or have quit within the past 15 years. Fecal occult blood test (FOBT) of the stool. You may have this test every year starting at age 85. Flexible sigmoidoscopy or colonoscopy. You may have a sigmoidoscopy every 5 years or a colonoscopy every 10 years starting at age 85. Prostate cancer screening. Recommendations will vary depending on your family history and other risks. Hepatitis C blood test. Hepatitis B blood test. Sexually transmitted disease (STD) testing. Diabetes screening. This is done by checking your blood sugar (glucose) after you have not eaten for a while (fasting). You may have this done every 1-3 years. Abdominal aortic aneurysm (AAA) screening. You may need this if you are a current or former smoker. Osteoporosis. You may be screened starting at age 85 if you are at high risk. Talk with your health care provider about your test results, treatment options, and if necessary, the need for more tests. Vaccines  Your health care provider may recommend certain vaccines, such as: Influenza vaccine. This is recommended every year. Tetanus, diphtheria, and acellular pertussis (Tdap, Td) vaccine. You may need a Td booster every 10 years. Zoster vaccine. You may need this  after age 85. Pneumococcal 13-valent conjugate (PCV13) vaccine. One dose  is recommended after age 85. Pneumococcal polysaccharide (PPSV23) vaccine. One dose is recommended after age 37. Talk to your health care provider about which screenings and vaccines you need and how often you need them. This information is not intended to replace advice given to you by your health care provider. Make sure you discuss any questions you have with your health care provider. Document Released: 08/30/2015 Document Revised: 04/22/2016 Document Reviewed: 06/04/2015 Elsevier Interactive Patient Education  2017 [employer] Prevention in the Home Falls can cause injuries. They can happen to people of all ages. There are many things you can do to make your home safe and to help prevent falls. What can I do on the outside of my home? Regularly fix the edges of walkways and driveways and fix any cracks. Remove anything that might make you trip as you walk through a door, such as a raised step or threshold. Trim any bushes or trees on the path to your home. Use bright outdoor lighting. Clear any walking paths of anything that might make someone trip, such as rocks or tools. Regularly check to see if handrails are loose or broken. Make sure that both sides of any steps have handrails. Any raised decks and porches should have guardrails on the edges. Have any leaves, snow, or ice cleared regularly. Use sand or salt on walking paths during winter. Clean up any spills in your garage right away. This includes oil or grease spills. What can I do in the bathroom? Use night lights. Install grab bars by the toilet and in the tub and shower. Do not use towel bars as grab bars. Use non-skid mats or decals in the tub or shower. If you need to sit down in the shower, use a plastic, non-slip stool. Keep the floor dry. Clean up any water that spills on the floor as soon as it happens. Remove soap buildup in the tub or shower regularly. Attach bath mats securely with double-sided non-slip rug  tape. Do not have throw rugs and other things on the floor that can make you trip. What can I do in the bedroom? Use night lights. Make sure that you have a light by your bed that is easy to reach. Do not use any sheets or blankets that are too big for your bed. They should not hang down onto the floor. Have a firm chair that has side arms. You can use this for support while you get dressed. Do not have throw rugs and other things on the floor that can make you trip. What can I do in the kitchen? Clean up any spills right away. Avoid walking on wet floors. Keep items that you use a lot in easy-to-reach places. If you need to reach something above you, use a strong step stool that has a grab bar. Keep electrical cords out of the way. Do not use floor polish or wax that makes floors slippery. If you must use wax, use non-skid floor wax. Do not have throw rugs and other things on the floor that can make you trip. What can I do with my stairs? Do not leave any items on the stairs. Make sure that there are handrails on both sides of the stairs and use them. Fix handrails that are broken or loose. Make sure that handrails are as long as the stairways. Check any carpeting to make sure that it is firmly attached to the  stairs. Fix any carpet that is loose or worn. Avoid having throw rugs at the top or bottom of the stairs. If you do have throw rugs, attach them to the floor with carpet tape. Make sure that you have a light switch at the top of the stairs and the bottom of the stairs. If you do not have them, ask someone to add them for you. What else can I do to help prevent falls? Wear shoes that: Do not have high heels. Have rubber bottoms. Are comfortable and fit you well. Are closed at the toe. Do not wear sandals. If you use a stepladder: Make sure that it is fully opened. Do not climb a closed stepladder. Make sure that both sides of the stepladder are locked into place. Ask someone to  hold it for you, if possible. Clearly mark and make sure that you can see: Any grab bars or handrails. First and last steps. Where the edge of each step is. Use tools that help you move around (mobility aids) if they are needed. These include: Canes. Walkers. Scooters. Crutches. Turn on the lights when you go into a dark area. Replace any light bulbs as soon as they burn out. Set up your furniture so you have a clear path. Avoid moving your furniture around. If any of your floors are uneven, fix them. If there are any pets around you, be aware of where they are. Review your medicines with your doctor. Some medicines can make you feel dizzy. This can increase your chance of falling. Ask your doctor what other things that you can do to help prevent falls. This information is not intended to replace advice given to you by your health care provider. Make sure you discuss any questions you have with your health care provider. Document Released: 05/30/2009 Document Revised: 01/09/2016 Document Reviewed: 09/07/2014 Elsevier Interactive Patient Education  2017 [employer].

## 2021-05-01 NOTE — Progress Notes (Signed)
This visit occurred during the SARS-CoV-2 public health emergency.  Safety protocols were in place, including screening questions prior to the visit, additional usage of staff PPE, and extensive cleaning of exam room while observing appropriate contact time as indicated for disinfecting solutions.  Subjective:   Marcus Johnston is a 87 y.o. male who presents for Medicare Annual/Subsequent preventive examination.  Review of Systems     Cardiac Risk Factors include: advanced age (>58mn, >>78women);diabetes mellitus;dyslipidemia;hypertension;male gender     Objective:    Today's Vitals   05/01/21 1140  BP: 124/76  Pulse: 82  Temp: 98.3 F (36.8 C)  TempSrc: Oral  SpO2: 97%  Weight: 225 lb 3.2 oz (102.2 kg)  Height: '6\' 3"'$  (1.905 m)   Body mass index is 28.15 kg/m.  Advanced Directives 05/01/2021 04/04/2020 12/17/2019 05/08/2019 04/30/2019 03/23/2019 10/28/2018  Does Patient Have a Medical Advance Directive? No No No No No No No  Would patient like information on creating a medical advance directive? - No - Patient declined No - Patient declined No - Patient declined No - Patient declined No - Patient declined No - Patient declined    Current Medications (verified) Outpatient Encounter Medications as of 05/01/2021  Medication Sig   acetaminophen (TYLENOL) 325 MG tablet Take 650 mg by mouth every 6 (six) hours as needed for mild pain or fever.   amLODipine (NORVASC) 5 MG tablet TAKE 1 TABLET BY MOUTH  TWICE DAILY   carvedilol (COREG) 12.5 MG tablet TAKE 1 TABLET BY MOUTH  TWICE DAILY WITH A MEAL   Cholecalciferol (VITAMIN D3) 50 MCG (2000 UT) capsule Take 1 capsule by mouth daily.   doxazosin (CARDURA) 2 MG tablet Take 2 mg by mouth daily.   fexofenadine (ALLEGRA) 180 MG tablet Take 180 mg by mouth daily as needed (allergies.).    insulin lispro protamine-lispro (HUMALOG 50/50 MIX) (50-50) 100 UNIT/ML SUSP injection Inject 10-15 Units into the skin See admin instructions. Inject 25 units  subcutaneously with breakfast and 22 units with supper   Insulin Syringe-Needle U-100 (EASY TOUCH INSULIN SYRINGE) 30G X 5/16" 0.5 ML MISC USE 3 TIMES DAILY   metFORMIN (GLUCOPHAGE-XR) 500 MG 24 hr tablet Take 1,000 mg by mouth 2 (two) times daily with a meal.    TRULICITY [id number] SMARTSIG:0.5 Milliliter(s) SUB-Q Once a Week   losartan-hydrochlorothiazide (HYZAAR) 100-25 MG tablet TAKE 1 TABLET BY MOUTH  DAILY (Patient not taking: Reported on 05/01/2021)   No facility-administered encounter medications on file as of 05/01/2021.    Allergies (verified) Bidil [isosorb dinitrate-hydralazine] and Statins   History: Past Medical History:  Diagnosis Date   Atrial fibrillation ([facility]    Atypical atrial flutter ([facility] 10/17/2018   Diabetes mellitus without complication (HCC)    Diabetic retinopathy ([facility]    NPDR OU   High cholesterol    History of prostate cancer    HTN (hypertension)    Hypertensive retinopathy    OU   Renal insufficiency    Shingles    Past Surgical History:  Procedure Laterality Date   ATRIAL TACH ABLATION N/A 05/08/2019   Procedure: ATRIAL TACH ABLATION;  Surgeon: TEvans Lance MD;  [facility];  Service: Cardiovascular;  Laterality: N/A;   CATARACT EXTRACTION Bilateral    EYE SURGERY Bilateral    Cat Sx   prostate implant     Family History  Problem Relation Age of Onset   Cancer Mother    Cancer Father    Social  History   Socioeconomic History   Marital status: Married    Spouse name: Not on file   Number of children: 5   Years of education: Not on file   Highest education level: Not on file  Occupational History   Occupation: retired  Tobacco Use   Smoking status: Never   Smokeless tobacco: Never  Vaping Use   Vaping Use: Never used  Substance and Sexual Activity   Alcohol use: No   Drug use: No   Sexual activity: Yes  Other Topics Concern   Not on file  Social History Narrative   Not on file   Social  Determinants of Health   Financial Resource Strain: Low Risk    Difficulty of Paying Living Expenses: Not hard at all  Food Insecurity: No Food Insecurity   Worried About Charity fundraiser in the Last Year: Never true   [place] in the Last Year: Never true  Transportation Needs: No Transportation Needs   Lack of Transportation (Medical): No   Lack of Transportation (Non-Medical): No  Physical Activity: Insufficiently Active   Days of Exercise per Week: 3 days   Minutes of Exercise per Session: 30 min  Stress: No Stress Concern Present   Feeling of Stress : Not at all  Social Connections: Not on file    Tobacco Counseling Counseling given: Not Answered   Clinical Intake:  Pre-visit preparation completed: Yes  Pain : No/denies pain     Nutritional Status: BMI 25 -29 Overweight Nutritional Risks: None Diabetes: Yes  How often do you need to have someone help you when you read instructions, pamphlets, or other written materials from your doctor or pharmacy?: 1 - Never What is the last grade level you completed in school?: associate degree  Diabetic? Yes Nutrition Risk Assessment:  Has the patient had any N/V/D within the last 2 months?  No  Does the patient have any non-healing wounds?  No  Has the patient had any unintentional weight loss or weight gain?  No   Diabetes:  Is the patient diabetic?  Yes  If diabetic, was a CBG obtained today?  No  Did the patient bring in their glucometer from home?  No  How often do you monitor your CBG's? daily.   Financial Strains and Diabetes Management:  Are you having any financial strains with the device, your supplies or your medication? No .  Does the patient want to be seen by Chronic Care Management for management of their diabetes?  No  Would the patient like to be referred to a Nutritionist or for Diabetic Management?  No   Diabetic Exams:  Diabetic Eye Exam: Completed 09/10/2020 Diabetic Foot Exam:  Completed 04/16/2021   Interpreter Needed?: No  Information entered by :: NAllen LPN   Activities of Daily Living In your present state of health, do you have any difficulty performing the following activities: 05/01/2021 04/16/2021  Hearing? N N  Vision? N N  Difficulty concentrating or making decisions? N N  Walking or climbing stairs? N N  Dressing or bathing? N N  Doing errands, shopping? N N  Preparing Food and eating ? N -  Using the Toilet? N -  In the past six months, have you accidently leaked urine? N -  Do you have problems with loss of bowel control? N -  Managing your Medications? N -  Managing your Finances? N -  Housekeeping or managing your Housekeeping? N -  Some recent  data might be hidden    Patient Care Team: Glendale Chard, MD as PCP - General (Internal Medicine) Buford Dresser, MD as PCP - Cardiology (Cardiology) [place], Kennieth Francois, [facility] (Inactive) (Pharmacist)  Indicate any recent Medical Services you may have received from other than [facility] providers in the past year (date may be approximate).     Assessment:   This is a routine wellness examination for Marcus Johnston.  Hearing/Vision screen No results found.  Dietary issues and exercise activities discussed: Current Exercise Habits: Home exercise routine, Type of exercise: walking, Time (Minutes): 45   Goals Addressed             This Visit's Progress    Patient Stated       05/01/2021, no goals       Depression Screen PHQ 2/9 Scores 05/01/2021 04/16/2021 04/04/2020 04/03/2020 03/23/2019 10/17/2018 09/12/2018  PHQ - 2 Score 0 0 0 0 0 0 0  PHQ- 9 Score - - 0 - 0 - -    Fall Risk Fall Risk  05/01/2021 04/16/2021 04/04/2020 05/15/2019 03/23/2019  Falls in the past year? 0 0 0 1 0  Number falls in past yr: - 0 - 0 -  Injury with Fall? - 0 - 0 -  Risk for fall due to : Medication side effect No Fall Risks Medication side effect - Medication side effect  Follow up Falls evaluation completed;Education  provided;Falls prevention discussed - Falls evaluation completed;Education provided;Falls prevention discussed - Falls evaluation completed;Education provided;Falls prevention discussed    FALL RISK PREVENTION PERTAINING TO THE HOME:  Any stairs in or around the home? Yes  If so, are there any without handrails? No  Home free of loose throw rugs in walkways, pet beds, electrical cords, etc? Yes  Adequate lighting in your home to reduce risk of falls? Yes   ASSISTIVE DEVICES UTILIZED TO PREVENT FALLS:  Life alert? No  Use of a cane, walker or w/c? No  Grab bars in the bathroom? No  Shower chair or bench in shower? No  Elevated toilet seat or a handicapped toilet? Yes   TIMED UP AND GO:  Was the test performed? No .     Gait steady and fast without use of assistive device  Cognitive Function:     6CIT Screen 05/01/2021 04/04/2020 03/23/2019  What Year? 0 points 0 points 0 points  What month? 0 points 0 points 0 points  What time? 0 points 0 points 0 points  Count back from 20 0 points 0 points 0 points  Months in reverse 0 points 0 points 2 points  Repeat phrase 2 points 4 points 0 points  Total Score '2 4 2    '$ Immunizations Immunization History  Administered Date(s) Administered   Fluad Quad(high Dose 65+) 05/02/2019, 06/13/2020   Influenza-Unspecified 05/17/2001, 08/16/2003   PFIZER(Purple Top)SARS-COV-2 Vaccination 09/22/2019, 10/13/2019, 07/08/2020   Pneumococcal-Unspecified 02/08/2002    TDAP status: Up to date  Flu Vaccine status: Completed at today's visit  Pneumococcal vaccine status: Up to date  Covid-19 vaccine status: Completed vaccines  Qualifies for Shingles Vaccine? Yes   Zostavax completed No   Shingrix Completed?: No.    Education has been provided regarding the importance of this vaccine. Patient has been advised to call insurance company to determine out of pocket expense if they have not yet received this vaccine. Advised may also receive vaccine  at local pharmacy or Health Dept. Verbalized acceptance and understanding.  Screening Tests Health Maintenance  Topic Date Due  PNA vac Low Risk Adult (2 of 2 - PCV13) 02/09/2003   COVID-19 Vaccine (4 - Booster for Pfizer series) 09/30/2020   INFLUENZA VACCINE  03/17/2021   Zoster Vaccines- Shingrix (1 of 2) 05/09/2021 (Originally 04/22/1950)   TETANUS/TDAP  07/20/2021   OPHTHALMOLOGY EXAM  09/10/2021   HEMOGLOBIN A1C  10/14/2021   FOOT EXAM  04/16/2022   HPV VACCINES  Aged Out    Health Maintenance  Health Maintenance Due  Topic Date Due   PNA vac Low Risk Adult (2 of 2 - PCV13) 02/09/2003   COVID-19 Vaccine (4 - Booster for Pfizer series) 09/30/2020   INFLUENZA VACCINE  03/17/2021    Colorectal cancer screening: No longer required.   Lung Cancer Screening: (Low Dose CT Chest recommended if Age 39-80 years, 30 pack-year currently smoking OR have quit w/in 15years.) does not qualify.   Lung Cancer Screening Referral: no  Additional Screening:  Hepatitis C Screening: does not qualify;   Vision Screening: Recommended annual ophthalmology exams for early detection of glaucoma and other disorders of the eye. Is the patient up to date with their annual eye exam?  Yes  Who is the provider or what is the name of the office in which the patient attends annual eye exams? Does not remember name If pt is not established with a provider, would they like to be referred to a provider to establish care? No .   Dental Screening: Recommended annual dental exams for proper oral hygiene  Community Resource Referral / Chronic Care Management: CRR required this visit?  No   CCM required this visit?  No      Plan:     I have personally reviewed and noted the following in the patient's chart:   Medical and social history Use of alcohol, tobacco or illicit drugs  Current medications and supplements including opioid prescriptions. Patient is not currently taking opioid  prescriptions. Functional ability and status Nutritional status Physical activity Advanced directives List of other physicians Hospitalizations, surgeries, and ER visits in previous 12 months Vitals Screenings to include cognitive, depression, and falls Referrals and appointments  In addition, I have reviewed and discussed with patient certain preventive protocols, quality metrics, and best practice recommendations. A written personalized care plan for preventive services as well as general preventive health recommendations were provided to patient.     Kellie Simmering, LPN   [id number]   Nurse Notes:

## 2021-05-03 NOTE — Progress Notes (Signed)
  Subjective:  Patient ID: Marcus Johnston, male    DOB: Feb 14, 1931,  MRN: [id number]  87 y.o. male presents preventative diabetic foot care and painful thick toenails that are difficult to trim. Pain interferes with ambulation. Aggravating factors include wearing enclosed shoe gear. Pain is relieved with periodic professional debridement.  Patient states blood glucose was 99 mg/dl this morning.  PCP is Glendale Chard, MD , and last visit was 04/16/2021.  Allergies  Allergen Reactions   Bidil [Isosorb Dinitrate-Hydralazine] Other (See Comments)    Severe hypotension episode after administering one dose of Bidil 20-37.5 mg on 09/25/2019.    Statins Other (See Comments)    Leg pain    Review of Systems: Negative except as noted in the HPI.   Objective:  Vascular Examination: Vascular status intact b/l with palpable pedal pulses. CFT immediate b/l. No edema. No pain with calf compression b/l. Skin temperature gradient WNL b/l.   Neurological Examination: Sensation grossly intact b/l with 10 gram monofilament. Vibratory sensation intact b/l.   Dermatological Examination: Pedal skin with normal turgor, texture and tone b/l. Toenails 1-5 b/l thick, discolored, elongated with subungual debris and pain on dorsal palpation. No hyperkeratotic lesions noted b/l.   Musculoskeletal Examination: Muscle strength 5/5 to b/l LE.  Radiographs: None Assessment:   1. Pain due to onychomycosis of toenail   2. Type 2 diabetes mellitus without complication, with long-term current use of insulin ([place])     Plan:  -Examined patient. -Continue diabetic foot care principles: inspect feet daily, monitor glucose as recommended by PCP and/or Endocrinologist, and follow prescribed diet per PCP, Endocrinologist and/or dietician. -Patient to continue soft, supportive shoe gear daily. -Toenails 1-5 b/l were debrided in length and girth with sterile nail nippers and dremel without iatrogenic bleeding.  -Patient  to report any pedal injuries to medical professional immediately. -Patient/POA to call should there be question/concern in the interim.  Return in about 9 weeks (around 07/01/2021).  Marzetta Board, DPM

## 2021-06-12 ENCOUNTER — Other Ambulatory Visit: Payer: Self-pay | Admitting: Internal Medicine

## 2021-06-12 DIAGNOSIS — I10 Essential (primary) hypertension: Secondary | ICD-10-CM

## 2021-06-30 ENCOUNTER — Other Ambulatory Visit: Payer: Self-pay

## 2021-06-30 ENCOUNTER — Ambulatory Visit: Payer: Medicare Other | Admitting: Podiatry

## 2021-06-30 ENCOUNTER — Encounter: Payer: Self-pay | Admitting: Podiatry

## 2021-06-30 DIAGNOSIS — M79676 Pain in unspecified toe(s): Secondary | ICD-10-CM | POA: Diagnosis not present

## 2021-06-30 DIAGNOSIS — E119 Type 2 diabetes mellitus without complications: Secondary | ICD-10-CM | POA: Diagnosis not present

## 2021-06-30 DIAGNOSIS — Z794 Long term (current) use of insulin: Secondary | ICD-10-CM | POA: Diagnosis not present

## 2021-06-30 DIAGNOSIS — B351 Tinea unguium: Secondary | ICD-10-CM

## 2021-07-03 NOTE — Progress Notes (Signed)
  Subjective:  Patient ID: Marcus Johnston, male    DOB: 1930-10-01,  MRN: [id number]  Marcus Johnston Age presents to clinic today for painful elongated mycotic toenails 1-5 bilaterally which are tender when wearing enclosed shoe gear. Pain is relieved with periodic professional debridement.  Patient is diabetic and states his blood glucose was 145 mg/dl today.  He voices no new pedal problems on today's visit.  PCP is Glendale Chard, MD , and last visit was 04/23/2021.  Allergies  Allergen Reactions   Bidil [Isosorb Dinitrate-Hydralazine] Other (See Comments)    Severe hypotension episode after administering one dose of Bidil 20-37.5 mg on 09/25/2019.    Statins Other (See Comments)    Leg pain    Review of Systems: Negative except as noted in the HPI. Objective:   Constitutional Marcus Johnston is a pleasant 87 y.o. African American male, in NAD. AAO x 3.   Vascular CFT immediate Johnston/l LE. Palpable DP/PT pulses Johnston/l LE. Digital hair sparse Johnston/l. Skin temperature gradient WNL Johnston/l. No pain with calf compression Johnston/l. No edema noted Johnston/l. No cyanosis or clubbing noted Johnston/l LE. No cyanosis or clubbing noted.  Neurologic Normal speech. Oriented to person, place, and time. Protective sensation intact 5/5 intact bilaterally with 10g monofilament Johnston/l.  Dermatologic Pedal skin warm and supple Johnston/l.  No open wounds Johnston/l. No interdigital macerations. Toenails 1-5 Johnston/l elongated, thickened, discolored with subungual debris. +Tenderness with dorsal palpation of nailplates.No hyperkeratotic nor porokeratotic lesions noted Johnston/l.  Orthopedic: Normal muscle strength 5/5 to all lower extremity muscle groups bilaterally. No pain, crepitus or joint limitation noted with ROM Johnston/l LE. No gross bony pedal deformities Johnston/l. Patient ambulates independently without assistive aids.   Radiographs: None Assessment:   1. Pain due to onychomycosis of toenail   2. Type 2 diabetes mellitus without complication, with long-term current  use of insulin ([place])    Plan:  Patient was evaluated and treated and all questions answered. Consent given for treatment as described below: -No new findings. No new orders. -Continue diabetic foot care principles daily. -Mycotic toenails 1-5 bilaterally were debrided in length and girth with sterile nail nippers and dremel without incident. -Patient/POA to call should there be question/concern in the interim.  Return in about 2 months (around 09/02/2021).  Marzetta Board, DPM

## 2021-07-28 DIAGNOSIS — E119 Type 2 diabetes mellitus without complications: Secondary | ICD-10-CM | POA: Diagnosis not present

## 2021-07-28 DIAGNOSIS — Z794 Long term (current) use of insulin: Secondary | ICD-10-CM | POA: Diagnosis not present

## 2021-07-28 DIAGNOSIS — E1165 Type 2 diabetes mellitus with hyperglycemia: Secondary | ICD-10-CM | POA: Diagnosis not present

## 2021-08-07 ENCOUNTER — Other Ambulatory Visit: Payer: Self-pay | Admitting: Internal Medicine

## 2021-08-20 ENCOUNTER — Other Ambulatory Visit: Payer: Self-pay | Admitting: Internal Medicine

## 2021-08-29 ENCOUNTER — Encounter: Payer: Self-pay | Admitting: Internal Medicine

## 2021-08-29 LAB — HEMOGLOBIN A1C: Hemoglobin A1C: 7.6

## 2021-09-02 ENCOUNTER — Ambulatory Visit: Payer: Medicare Other | Admitting: Podiatry

## 2021-09-02 ENCOUNTER — Other Ambulatory Visit: Payer: Self-pay

## 2021-09-02 ENCOUNTER — Encounter: Payer: Self-pay | Admitting: Podiatry

## 2021-09-02 DIAGNOSIS — M79676 Pain in unspecified toe(s): Secondary | ICD-10-CM

## 2021-09-02 DIAGNOSIS — E119 Type 2 diabetes mellitus without complications: Secondary | ICD-10-CM

## 2021-09-02 DIAGNOSIS — B351 Tinea unguium: Secondary | ICD-10-CM | POA: Diagnosis not present

## 2021-09-02 DIAGNOSIS — N183 Chronic kidney disease, stage 3 unspecified: Secondary | ICD-10-CM | POA: Insufficient documentation

## 2021-09-02 DIAGNOSIS — N1831 Chronic kidney disease, stage 3a: Secondary | ICD-10-CM | POA: Insufficient documentation

## 2021-09-02 DIAGNOSIS — E1165 Type 2 diabetes mellitus with hyperglycemia: Secondary | ICD-10-CM | POA: Insufficient documentation

## 2021-09-02 DIAGNOSIS — Z794 Long term (current) use of insulin: Secondary | ICD-10-CM

## 2021-09-02 NOTE — Progress Notes (Signed)
Subjective: Marcus Johnston is a pleasant 88 y.o. male patient seen today for preventative diabetic foot care and painful elongated mycotic toenails 1-5 bilaterally which are tender when wearing enclosed shoe gear. Pain is relieved with periodic professional debridement.   Patient states their blood glucose was 132 mg/dl today.   PCP is Glendale Chard, MD. Last visit was: 04/16/2021.  Allergies  Allergen Reactions   Bidil [Isosorb Dinitrate-Hydralazine] Other (See Comments)    Severe hypotension episode after administering one dose of Bidil 20-37.5 mg on 09/25/2019.    Dapagliflozin Other (See Comments)   Pneumococcal Vac Polyvalent Other (See Comments)   Statins Other (See Comments)    Leg pain    Objective: Physical Exam  General: Marcus Johnston is a pleasant 88 y.o. African American male, WD, WN in NAD. AAO x 3.   Vascular:  Capillary refill time to digits immediate b/l. Palpable pedal pulses b/l LE. Pedal hair sparse. No pain with calf compression b/l. Lower extremity skin temperature gradient within normal limits. No edema noted b/l LE. No cyanosis or clubbing noted b/l LE.   Dermatological:  Pedal integument with normal turgor, texture and tone b/l LE. No open wounds b/l. No interdigital macerations b/l. Toenails 1-5 b/l elongated, thickened, discolored with subungual debris. +Tenderness with dorsal palpation of nailplates. No hyperkeratotic or porokeratotic lesions present.   Musculoskeletal:  Muscle strength 5/5 to all lower extremity muscle groups bilaterally. No pain, crepitus or joint limitation noted with ROM bilateral LE. No gross bony deformities bilaterally. Patient ambulates independent of any assistive aids.   Neurological:  Protective sensation intact 5/5 intact bilaterally with 10g monofilament b/l. Vibratory sensation intact b/l. Proprioception intact bilaterally. Clonus negative b/l.   Last A1c:   Hemoglobin A1C Latest Ref Rng & Units 04/16/2021  HGBA1C 4.8 -  5.6 % 7.0(H)  Some recent data might be hidden   Assessment and Plan:  1. Pain due to onychomycosis of toenail   2. Type 2 diabetes mellitus without complication, with long-term current use of insulin ([place])   Patient was evaluated and treated and all questions answered. Consent given for treatment as described below: -Examined patient. -No new findings. No new orders. -Toenails 1-5 b/l were debrided in length and girth with sterile nail nippers and dremel without iatrogenic bleeding.  -Patient/POA to call should there be question/concern in the interim.  Return in about 9 weeks (around 11/04/2021).  Marzetta Board, DPM

## 2021-09-05 ENCOUNTER — Encounter: Payer: Self-pay | Admitting: Internal Medicine

## 2021-09-11 LAB — HM DIABETES EYE EXAM

## 2021-10-01 ENCOUNTER — Ambulatory Visit (HOSPITAL_BASED_OUTPATIENT_CLINIC_OR_DEPARTMENT_OTHER): Payer: Medicare Other | Admitting: Cardiology

## 2021-10-01 ENCOUNTER — Encounter (HOSPITAL_BASED_OUTPATIENT_CLINIC_OR_DEPARTMENT_OTHER): Payer: Self-pay | Admitting: Cardiology

## 2021-10-01 ENCOUNTER — Other Ambulatory Visit: Payer: Self-pay

## 2021-10-01 VITALS — BP 150/78 | HR 69 | Ht 75.0 in | Wt 221.0 lb

## 2021-10-01 DIAGNOSIS — E1159 Type 2 diabetes mellitus with other circulatory complications: Secondary | ICD-10-CM | POA: Diagnosis not present

## 2021-10-01 DIAGNOSIS — E7800 Pure hypercholesterolemia, unspecified: Secondary | ICD-10-CM

## 2021-10-01 DIAGNOSIS — Z8673 Personal history of transient ischemic attack (TIA), and cerebral infarction without residual deficits: Secondary | ICD-10-CM | POA: Diagnosis not present

## 2021-10-01 DIAGNOSIS — I10 Essential (primary) hypertension: Secondary | ICD-10-CM

## 2021-10-01 DIAGNOSIS — I4719 Other supraventricular tachycardia: Secondary | ICD-10-CM

## 2021-10-01 DIAGNOSIS — I471 Supraventricular tachycardia: Secondary | ICD-10-CM

## 2021-10-01 NOTE — Progress Notes (Signed)
Cardiology Office Note:    Date:  10/01/2021   ID:  Marcus Johnston, DOB [birth year], MRN [id number]  PCP:  Marcus Chard, MD  Cardiologist:  Marcus Dresser, MD  EP: Dr. Lovena Johnston  Referring MD: Marcus Chard, MD   CC: follow up  History of Present Illness:    Marcus Johnston is a 88 y.o. male with a hx of hypertension, hyperlipidemia, type II diabetes, prior CVA, chronic kidney disease, atrial tachycardia s/p ablation who is seen for follow up today. I initially met him 03/05/20 as a new consult at the request of Marcus Chard, MD for the evaluation and management of hypertension and cardiovascular risk. He was previously followed by [facility]. He sees Dr. Lovena Johnston in EP for history of atrial tachycardia s/p ablation.  Today: He is doing well with no major concerns. Typically, his blood pressure stays around 379K systolic and 24O diastolic at home. Yesterday, his blood pressure was 120/60.   He has lost 17 lbs recently but is unsure why since his appetite and activity level is normal. However, he has stopped snacking throughout the day.  At home, his last weight was 218 lbs. Our weights here in the office are only about 4 lbs less than prior.  He does not exercise regularly. However, he is able to walk up and down stairs with no exertional symptoms.   Denies chest pain, shortness of breath at rest or with normal exertion. No PND, orthopnea, or Johnston edema. No syncope or palpitations.  Past Medical History:  Diagnosis Date   Atrial fibrillation ([place])    Atypical atrial flutter ([place]) 10/17/2018   Diabetes mellitus without complication (HCC)    Diabetic retinopathy (HCC)    NPDR OU   High cholesterol    History of prostate cancer    HTN (hypertension)    Hypertensive retinopathy    OU   Renal insufficiency    Shingles     Past Surgical History:  Procedure Laterality Date   ATRIAL TACH ABLATION N/A 05/08/2019   Procedure: ATRIAL TACH ABLATION;  Surgeon: [name], MD;  Location: [facility];  Service: Cardiovascular;  Laterality: N/A;   CATARACT EXTRACTION Bilateral    EYE SURGERY Bilateral    Cat Sx   prostate implant      Current Medications: Current Outpatient Medications on File Prior to Visit  Medication Sig   acetaminophen (TYLENOL) 325 MG tablet Take 650 mg by mouth every 6 (six) hours as needed for mild pain or fever.   amLODipine (NORVASC) 5 MG tablet Take 2 tablets by mouth daily.   aspirin 81 MG chewable tablet 1 tablet   carvedilol (COREG) 12.5 MG tablet TAKE 1 TABLET BY MOUTH  TWICE DAILY WITH MEALS   Cholecalciferol (VITAMIN D3) 50 MCG (2000 UT) capsule Take 1 capsule by mouth daily.   dapagliflozin propanediol (FARXIGA) 5 MG TABS tablet 1 tablet   doxazosin (CARDURA) 2 MG tablet Take 2 mg by mouth daily.   fexofenadine (ALLEGRA) 180 MG tablet Take 180 mg by mouth daily as needed (allergies.).    insulin lispro protamine-lispro (HUMALOG 50/50 MIX) (50-50) 100 UNIT/ML SUSP injection Inject 10-15 Units into the skin See admin instructions. Inject 25 units subcutaneously with breakfast and 22 units with supper   Insulin Syringe-Needle U-100 (EASY TOUCH INSULIN SYRINGE) 30G X 5/16" 0.5 ML MISC USE 3 TIMES DAILY   losartan-hydrochlorothiazide (HYZAAR) 100-25 MG tablet TAKE 1 TABLET BY MOUTH  DAILY   metFORMIN (GLUCOPHAGE-XR) 500  MG 24 hr tablet Take 1,000 mg by mouth 2 (two) times daily with a meal.    TRULICITY 2.97 LG/9.2JJ SOPN SMARTSIG:0.5 Milliliter(s) SUB-Q Once a Week   No current facility-administered medications on file prior to visit.     Allergies:   Bidil [isosorb dinitrate-hydralazine], Dapagliflozin, Pneumococcal vac polyvalent, and Statins   Social History   Tobacco Use   Smoking status: Never   Smokeless tobacco: Never  Vaping Use   Vaping Use: Never used  Substance Use Topics   Alcohol use: No   Drug use: No    Family History: family history includes Cancer in his father and mother.  ROS:    Please see the history of present illness.  Additional pertinent ROS otherwise unremarkable. (+) Weight loss  EKGs/Labs/Other Studies Reviewed:    The following studies were reviewed today: Echo 12/18/19  1. Left ventricular ejection fraction, by estimation, is 60 to 65%. The  left ventricle has normal function. The left ventricle has no regional  wall motion abnormalities. There is moderate left ventricular hypertrophy. Left ventricular diastolic parameters are consistent with Grade I diastolic dysfunction (impaired relaxation).   2. Right ventricular systolic function is normal. The right ventricular  size is normal.   3. The mitral valve is normal in structure. No evidence of mitral valve  regurgitation. No evidence of mitral stenosis.   4. The aortic valve is normal in structure. Aortic valve regurgitation is  not visualized. No aortic stenosis is present.   5. The inferior vena cava is normal in size with greater than 50%  respiratory variability, suggesting right atrial pressure of 3 mmHg.   EKG:  EKG is personally reviewed.   10/01/21: SR with PACs at 69 bpm 12/20/19: NSR  Recent Labs: 04/16/2021: ALT 6; BUN 17; Creatinine, Ser 1.32; Hemoglobin 13.6; Platelets 161; Potassium 4.4; Sodium 140  Recent Lipid Panel    Component Value Date/Time   CHOL 173 04/16/2021 0952   TRIG 66 04/16/2021 0952   HDL 74 04/16/2021 0952   CHOLHDL 2.3 04/16/2021 0952   CHOLHDL 2.7 12/19/2019 0135   VLDL 13 12/19/2019 0135   LDLCALC 86 04/16/2021 0952    Physical Exam:    VS:  BP (!) 150/78 (BP Location: Left Arm, Patient Position: Sitting, Cuff Size: Normal)    Pulse 69    Ht 6' 3"  (1.905 m)    Wt 221 lb (100.2 kg)    SpO2 98%    BMI 27.62 kg/m     Wt Readings from Last 3 Encounters:  10/01/21 221 lb (100.2 kg)  05/01/21 225 lb 3.2 oz (102.2 kg)  04/16/21 226 lb (102.5 kg)    GEN: Well nourished, well developed in no acute distress HEENT: Normal, moist mucous membranes NECK: No  JVD CARDIAC: regular rhythm, normal S1 and S2, no rubs or gallops. No murmur. VASCULAR: Radial and DP pulses 2+ bilaterally. No carotid bruits RESPIRATORY:  Clear to auscultation without rales, wheezing or rhonchi  ABDOMEN: Soft, non-tender, non-distended MUSCULOSKELETAL:  Ambulates independently SKIN: Warm and dry, no edema NEUROLOGIC:  Alert and oriented x 3. No focal neuro deficits noted. PSYCHIATRIC:  Normal affect   ASSESSMENT:    1. Essential hypertension   2. Type 2 diabetes mellitus with other circulatory complications (HCC)   3. Pure hypercholesterolemia   4. H/O: CVA (cerebrovascular accident)   5. Paroxysmal atrial tachycardia ([place])   6. White coat syndrome with hypertension     PLAN:    Hypertension: -elevated in office  today, but well controlled at home. -he feels that he has a component of white coat syndrome as his BP is usually only elevated in the office -with history of presyncope and given his age, would avoid overtreating -continue amlodipine 5 mg daily (prior was BID per patient preference), carvedilol 12.5 mg BID, doxazosin 2 mg daily, losartan-HCTZ 100-25 mg daily -goal <130/80  Hyperlipidemia Type II diabetes: -given ASCVD and diabetes, he is on SGLT2i (dapagliflozin) and EHM0NO (trulicity) -on insulin, metformin -given diabetes, would generally recommend statin. See prior note, he declines at this time. -on aspirin  History of CVA: -MRI reviewed, moderate chronic microvascular ischemic changes, scattered punctate foci c/w chronic hypertenion -on aspirin, declined statin  Paroxysmal atrial tachycardia: S/p ablation by Dr. Lovena Johnston Per Dr. Tanna Furry note 06/15/19, his rhythm does not require anticoagulation  Cardiac risk counseling and prevention recommendations: -recommend heart healthy/Mediterranean diet, with whole grains, fruits, vegetable, fish, lean meats, nuts, and olive oil. Limit salt. -recommend moderate walking, 3-5 times/week for 30-50  minutes each session. Aim for at least 150 minutes.week. Goal should be pace of 3 miles/hours, or walking 1.5 miles in 30 minutes -recommend avoidance of tobacco products. Avoid excess alcohol.  Plan for follow up: 1 year  Marcus Dresser, MD, PhD, [facility]    Medication Adjustments/Labs and Tests Ordered: Current medicines are reviewed at length with the patient today.  Concerns regarding medicines are outlined above.  Orders Placed This Encounter  Procedures   EKG 12-Lead   No orders of the defined types were placed in this encounter.   Patient Instructions  Medication Instructions:  Your Physician recommend you continue on your current medication as directed.    *If you need a refill on your cardiac medications before your next appointment, please call your pharmacy*   Lab Work: None ordered today   Testing/Procedures: None ordered today    Follow-Up: At [facility], you and your health needs are our priority.  As part of our continuing mission to provide you with exceptional heart care, we have created designated Provider Care Teams.  These Care Teams include your primary Cardiologist (physician) and Advanced Practice Providers (APPs -  Physician Assistants and Nurse Practitioners) who all work together to provide you with the care you need, when you need it.  We recommend signing up for the patient portal called "MyChart".  Sign up information is provided on this After Visit Summary.  MyChart is used to connect with patients for Virtual Visits (Telemedicine).  Patients are able to view lab/test results, encounter notes, upcoming appointments, etc.  Non-urgent messages can be sent to your provider as well.   To learn more about what you can do with MyChart, go to [email or web address].    Your next appointment:   1 year(s)  The format for your next appointment:   In Person  Provider:   Buford Dresser, MD{    Wilhemina Bonito as a scribe for Marcus Dresser, MD.,have documented all relevant documentation on the behalf of Marcus Dresser, MD,as directed by  Marcus Dresser, MD while in the presence of Marcus Dresser, MD.  I, Marcus Dresser, MD, have reviewed all documentation for this visit. The documentation on 10/01/21 for the exam, diagnosis, procedures, and orders are all accurate and complete.   Signed, Marcus Dresser, MD PhD 10/01/2021     [place]

## 2021-10-01 NOTE — Patient Instructions (Signed)

## 2021-10-03 ENCOUNTER — Ambulatory Visit (HOSPITAL_BASED_OUTPATIENT_CLINIC_OR_DEPARTMENT_OTHER): Payer: Medicare Other | Admitting: Cardiology

## 2021-10-15 ENCOUNTER — Other Ambulatory Visit: Payer: Self-pay

## 2021-10-15 ENCOUNTER — Encounter: Payer: Self-pay | Admitting: Internal Medicine

## 2021-10-15 ENCOUNTER — Ambulatory Visit: Payer: Medicare Other | Admitting: Internal Medicine

## 2021-10-15 VITALS — BP 132/80 | HR 75 | Temp 97.8°F | Ht 75.0 in | Wt 218.0 lb

## 2021-10-15 DIAGNOSIS — N1831 Chronic kidney disease, stage 3a: Secondary | ICD-10-CM | POA: Diagnosis not present

## 2021-10-15 DIAGNOSIS — E1122 Type 2 diabetes mellitus with diabetic chronic kidney disease: Secondary | ICD-10-CM

## 2021-10-15 DIAGNOSIS — E7800 Pure hypercholesterolemia, unspecified: Secondary | ICD-10-CM

## 2021-10-15 DIAGNOSIS — Z2821 Immunization not carried out because of patient refusal: Secondary | ICD-10-CM

## 2021-10-15 DIAGNOSIS — I1310 Hypertensive heart and chronic kidney disease without heart failure, with stage 1 through stage 4 chronic kidney disease, or unspecified chronic kidney disease: Secondary | ICD-10-CM | POA: Diagnosis not present

## 2021-10-15 DIAGNOSIS — Z79899 Other long term (current) drug therapy: Secondary | ICD-10-CM

## 2021-10-15 DIAGNOSIS — E1159 Type 2 diabetes mellitus with other circulatory complications: Secondary | ICD-10-CM

## 2021-10-15 LAB — POCT URINALYSIS DIPSTICK
Bilirubin, UA: NEGATIVE
Glucose, UA: POSITIVE — AB
Ketones, UA: NEGATIVE
Leukocytes, UA: NEGATIVE
Nitrite, UA: NEGATIVE
Protein, UA: POSITIVE — AB
Spec Grav, UA: 1.02 (ref 1.010–1.025)
Urobilinogen, UA: 0.2 E.U./dL
pH, UA: 5 (ref 5.0–8.0)

## 2021-10-15 LAB — LIPID PANEL
Chol/HDL Ratio: 2.3 ratio (ref 0.0–5.0)
Cholesterol, Total: 190 mg/dL (ref 100–199)
HDL: 84 mg/dL (ref >39)
LDL Chol Calc (NIH): 97 mg/dL (ref 0–99)
Triglycerides: 47 mg/dL (ref 0–149)
VLDL Cholesterol Cal: 9 mg/dL (ref 5–40)

## 2021-10-15 NOTE — Progress Notes (Signed)
?[name],acting as a Education administrator for [name], MD.,have documented all relevant documentation on the behalf of [name], MD,as directed by  [name], MD while in the presence of [name], MD.  ?This visit occurred during the SARS-CoV-2 public health emergency.  Safety protocols were in place, including screening questions prior to the visit, additional usage of staff PPE, and extensive cleaning of exam room while observing appropriate contact time as indicated for disinfecting solutions. ? ?Subjective:  ?  ? Patient ID: Marcus Johnston , male    DOB: September 04, 1930 , 88 y.o.   MRN: [id number] ? ? ?Chief Complaint  ?Patient presents with  ? Hypertension  ? ? ?HPI ? ?Patient presents today for bp check.  He reports compliance with meds. He denies headaches, chest pain and shortness of breath. He is also followed by Dr. Buddy Duty for diabetes. He thinks his last a1c was 7.6, last visit in Dec 2022.  ? ?Hypertension ?This is a chronic problem. The current episode started more than 1 year ago. The problem has been gradually improving since onset. The problem is uncontrolled. Pertinent negatives include no blurred vision, chest pain, palpitations or shortness of breath. Risk factors for coronary artery disease include diabetes mellitus, dyslipidemia, male gender and sedentary lifestyle. Past treatments include beta blockers.   ? ?Past Medical History:  ?Diagnosis Date  ? Atrial fibrillation ([place])   ? Atypical atrial flutter ([place]) 10/17/2018  ? Diabetes mellitus without complication ([place])   ? Diabetic retinopathy ([place])   ? NPDR OU  ? High cholesterol   ? History of prostate cancer   ? HTN (hypertension)   ? Hypertensive retinopathy   ? OU  ? Renal insufficiency   ? Shingles   ?  ? ?Family History  ?Problem Relation Age of Onset  ? Cancer Mother   ? Cancer Father   ? ? ? ?Current Outpatient Medications:  ?  acetaminophen (TYLENOL) 325 MG tablet, Take 650 mg by mouth every 6 (six) hours as needed for  mild pain or fever., Disp: , Rfl:  ?  amLODipine (NORVASC) 5 MG tablet, Take 2 tablets by mouth daily., Disp: , Rfl:  ?  aspirin 81 MG chewable tablet, 1 tablet, Disp: , Rfl:  ?  carvedilol (COREG) 12.5 MG tablet, TAKE 1 TABLET BY MOUTH  TWICE DAILY WITH MEALS, Disp: 180 tablet, Rfl: 3 ?  Cholecalciferol (VITAMIN D3) 50 MCG (2000 UT) capsule, Take 1 capsule by mouth daily., Disp: , Rfl:  ?  dapagliflozin propanediol (FARXIGA) 5 MG TABS tablet, 1 tablet, Disp: , Rfl:  ?  doxazosin (CARDURA) 2 MG tablet, Take 2 mg by mouth daily., Disp: , Rfl:  ?  fexofenadine (ALLEGRA) 180 MG tablet, Take 180 mg by mouth daily as needed (allergies.). , Disp: , Rfl:  ?  insulin lispro protamine-lispro (HUMALOG 50/50 MIX) (50-50) 100 UNIT/ML SUSP injection, Inject 10-15 Units into the skin See admin instructions. Inject 25 units subcutaneously with breakfast and 22 units with supper, Disp: , Rfl:  ?  Insulin Syringe-Needle U-100 (EASY TOUCH INSULIN SYRINGE) 30G X 5/16" 0.5 ML MISC, USE 3 TIMES DAILY, Disp: 270 each, Rfl: 3 ?  losartan-hydrochlorothiazide (HYZAAR) 100-25 MG tablet, TAKE 1 TABLET BY MOUTH  DAILY, Disp: 90 tablet, Rfl: 3 ?  metFORMIN (GLUCOPHAGE-XR) 500 MG 24 hr tablet, Take 1,000 mg by mouth 2 (two) times daily with a meal. , Disp: , Rfl:  ?  TRULICITY 7.32 KG/2.5KY SOPN, SMARTSIG:0.5 Milliliter(s) SUB-Q  Once a Week (Patient not taking: Reported on 10/15/2021), Disp: , Rfl:   ? ?Allergies  ?Allergen Reactions  ? Bidil [Isosorb Dinitrate-Hydralazine] Other (See Comments)  ?  Severe hypotension episode after administering one dose of Bidil 20-37.5 mg on 09/25/2019.   ? Dapagliflozin Other (See Comments)  ? Pneumococcal Vac Polyvalent Other (See Comments)  ? Statins Other (See Comments)  ?  Leg pain  ?  ? ?Review of Systems  ?Constitutional: Negative.   ?Eyes:  Negative for blurred vision.  ?Respiratory: Negative.  Negative for shortness of breath.   ?Cardiovascular: Negative.  Negative for chest pain and palpitations.   ?Gastrointestinal: Negative.   ?Neurological: Negative.   ?Psychiatric/Behavioral: Negative.     ? ?Today's Vitals  ? 10/15/21 0932  ?BP: 132/80  ?Pulse: 75  ?Temp: 97.8 ?F (36.6 ?C)  ?Weight: 218 lb (98.9 kg)  ?Height: 6' 3"  (1.905 m)  ?PainSc: 9   ? ?Body mass index is 27.25 kg/m?.  ? ?Objective:  ?Physical Exam ?Vitals and nursing note reviewed.  ?Constitutional:   ?   Appearance: Normal appearance.  ?HENT:  ?   Head: Normocephalic and atraumatic.  ?   Nose:  ?   Comments: Masked  ?   Mouth/Throat:  ?   Comments: Masked  ?Eyes:  ?   Extraocular Movements: Extraocular movements intact.  ?Cardiovascular:  ?   Rate and Rhythm: Normal rate and regular rhythm.  ?   Heart sounds: Normal heart sounds.  ?Pulmonary:  ?   Effort: Pulmonary effort is normal.  ?   Breath sounds: Normal breath sounds.  ?Musculoskeletal:  ?   Cervical back: Normal range of motion.  ?Skin: ?   General: Skin is warm.  ?Neurological:  ?   General: No focal deficit present.  ?   Mental Status: He is alert.  ?Psychiatric:     ?   Mood and Affect: Mood normal.  ?   ?Assessment And Plan:  ?   ?1. Malignant HTN with heart disease, w/o CHF, with chronic kidney disease ?Comments: Chronic, controlled. Goal BP>130/80. No med changes. Cardiology input is appreciated.  He will f/u in 6 months for a full physical exam.  ?- CMP14+EGFR ?- CBC no Diff ?- TSH ? ?2. Type 2 diabetes mellitus with other circulatory complications ([place]) ?Comments: Chronic, as per Dr. Buddy Duty. Importance of medication/dietary compliance was discussed with the patient. I will request his most recent eye exam. ?- POCT Urinalysis Dipstick (47829) ?- Microalbumin / Creatinine Urine Ratio ? ?3. Pure hypercholesterolemia ?Comments: He is not on statin therapy due to previous intolerance. He is willing to retry statin therapy. I will initiate therapy after reviewing his labs.  ?- Lipid panel ? ?4. Herpes zoster vaccination declined ? ?5. Tetanus, diphtheria, and acellular pertussis (Tdap)  vaccination declined ? ?6. Pneumococcal vaccination declined ? ?7. Polypharmacy ?Comments: I will check vitamin B12 level today.  ?- Vitamin B12 ?  ? ? ?Patient was given opportunity to ask questions. Patient verbalized understanding of the plan and was able to repeat key elements of the plan. All questions were answered to their satisfaction.  ?[name], MD  ? ?I, [name], MD, have reviewed all documentation for this visit. The documentation on 10/15/21 for the exam, diagnosis, procedures, and orders are all accurate and complete.  ? ?IF YOU HAVE BEEN REFERRED TO A SPECIALIST, IT MAY TAKE 1-2 WEEKS TO SCHEDULE/PROCESS THE REFERRAL. IF YOU HAVE NOT HEARD FROM US/SPECIALIST IN TWO WEEKS, PLEASE GIVE  [place] A CALL AT [phone] X [phone].  ? ?THE PATIENT IS ENCOURAGED TO PRACTICE SOCIAL DISTANCING DUE TO THE COVID-19 PANDEMIC.   ?

## 2021-10-15 NOTE — Patient Instructions (Signed)

## 2021-10-16 LAB — CMP14+EGFR
ALT: 7 IU/L (ref 0–44)
AST: 14 IU/L (ref 0–40)
Albumin/Globulin Ratio: 1.9 (ref 1.2–2.2)
Albumin: 4.7 g/dL — ABNORMAL HIGH (ref 3.6–4.6)
Alkaline Phosphatase: 86 IU/L (ref 44–121)
BUN/Creatinine Ratio: 14 (ref 10–24)
BUN: 20 mg/dL (ref 8–27)
Bilirubin Total: 0.5 mg/dL (ref 0.0–1.2)
CO2: 21 mmol/L (ref 20–29)
Calcium: 10 mg/dL (ref 8.6–10.2)
Chloride: 105 mmol/L (ref 96–106)
Creatinine, Ser: 1.4 mg/dL — ABNORMAL HIGH (ref 0.76–1.27)
Globulin, Total: 2.5 g/dL (ref 1.5–4.5)
Glucose: 155 mg/dL — ABNORMAL HIGH (ref 70–99)
Potassium: 4.3 mmol/L (ref 3.5–5.2)
Sodium: 146 mmol/L — ABNORMAL HIGH (ref 134–144)
Total Protein: 7.2 g/dL (ref 6.0–8.5)
eGFR: 48 mL/min/{1.73_m2} — ABNORMAL LOW (ref >59)

## 2021-10-16 LAB — CBC
Hematocrit: 45.4 % (ref 37.5–51.0)
Hemoglobin: 14.9 g/dL (ref 13.0–17.7)
MCH: 27.1 pg (ref 26.6–33.0)
MCHC: 32.8 g/dL (ref 31.5–35.7)
MCV: 83 fL (ref 79–97)
Platelets: 186 10*3/uL (ref 150–450)
RBC: 5.49 x10E6/uL (ref 4.14–5.80)
RDW: 13.8 % (ref 11.6–15.4)
WBC: 3.7 10*3/uL (ref 3.4–10.8)

## 2021-10-16 LAB — MICROALBUMIN / CREATININE URINE RATIO
Creatinine, Urine: 107.1 mg/dL
Microalb/Creat Ratio: 327 mg/g creat — ABNORMAL HIGH (ref 0–29)
Microalbumin, Urine: 350.1 ug/mL

## 2021-10-16 LAB — TSH: TSH: 3.85 u[IU]/mL (ref 0.450–4.500)

## 2021-10-16 LAB — VITAMIN B12: Vitamin B-12: 486 pg/mL (ref 232–1245)

## 2021-10-24 ENCOUNTER — Other Ambulatory Visit: Payer: Self-pay

## 2021-10-24 MED ORDER — ATORVASTATIN CALCIUM 20 MG PO TABS: 20.0000 mg | ORAL_TABLET | ORAL | 1 refills | Status: DC

## 2021-11-04 ENCOUNTER — Encounter: Payer: Self-pay | Admitting: Podiatry

## 2021-11-04 ENCOUNTER — Other Ambulatory Visit: Payer: Self-pay

## 2021-11-04 ENCOUNTER — Ambulatory Visit: Payer: Medicare Other | Admitting: Podiatry

## 2021-11-04 ENCOUNTER — Ambulatory Visit (INDEPENDENT_AMBULATORY_CARE_PROVIDER_SITE_OTHER): Payer: Medicare Other | Admitting: Podiatry

## 2021-11-04 DIAGNOSIS — D2272 Melanocytic nevi of left lower limb, including hip: Secondary | ICD-10-CM | POA: Diagnosis not present

## 2021-11-04 DIAGNOSIS — M79676 Pain in unspecified toe(s): Secondary | ICD-10-CM

## 2021-11-04 DIAGNOSIS — B351 Tinea unguium: Secondary | ICD-10-CM

## 2021-11-04 DIAGNOSIS — E119 Type 2 diabetes mellitus without complications: Secondary | ICD-10-CM | POA: Diagnosis not present

## 2021-11-04 NOTE — Progress Notes (Signed)
?  Subjective:  ?Patient ID: Marcus Johnston, male    DOB: 11/10/1930,  MRN: [id number] ? ?[name] presents to clinic today for preventative diabetic foot care and painful elongated mycotic toenails 1-5 bilaterally which are tender when wearing enclosed shoe gear. Pain is relieved with periodic professional debridement. ? ?Patient states blood glucose was 131 mg/dl today.  Last HgA1c was 7.6%. ? ?New problem(s): None.  ? ?PCP is Glendale Chard, MD , and last visit was October 15, 2021. ? ?Allergies  ?Allergen Reactions  ? Bidil [Isosorb Dinitrate-Hydralazine] Other (See Comments)  ?  Severe hypotension episode after administering one dose of Bidil 20-37.5 mg on 09/25/2019.   ? Dapagliflozin Other (See Comments)  ? Pneumococcal Vac Polyvalent Other (See Comments)  ? Statins Other (See Comments)  ?  Leg pain  ? ?Review of Systems: Negative except as noted in the HPI. ? ?Objective:  ?General: Marcus Johnston is a pleasant 88 y.o. African American male, WD, WN in NAD. AAO x 3.  ? ?Vascular:  ?Capillary refill time to digits immediate b/l. Palpable pedal pulses b/l LE. Pedal hair sparse. No pain with calf compression b/l. Lower extremity skin temperature gradient within normal limits. No edema noted b/l LE. No cyanosis or clubbing noted b/l LE.  ? ?Dermatological:  ?Copper colored macule 0.1 x 0.5 cm on distal tip of left 5th digit.Flat, no elevation, no palpable depth. Patient unaware, but he has scattered moles on the plantar aspect of both feet. Pedal integument with normal turgor, texture and tone b/l LE. No open wounds b/l. No interdigital macerations b/l. Toenails 1-5 b/l elongated, thickened, discolored with subungual debris. +Tenderness with dorsal palpation of nailplates. No hyperkeratotic or porokeratotic lesions present.  ? ? ?Musculoskeletal:  ?Muscle strength 5/5 to all lower extremity muscle groups bilaterally. No pain, crepitus or joint limitation noted with ROM bilateral LE. No gross bony deformities  bilaterally. Patient ambulates independent of any assistive aids.  ? ?Neurological:  ?Protective sensation intact 5/5 intact bilaterally with 10g monofilament b/l. Vibratory sensation intact b/l. Proprioception intact bilaterally. Clonus negative b/l.  ? ?Hemoglobin A1C 08/29/2021 04/16/2021  ?HGBA1C 7.6 7.0(H)  ?Some recent data might be hidden  ? ?Assessment/Plan: ?1. Pain due to onychomycosis of toenail   ?2. Type 2 diabetes mellitus without complication, with long-term current use of insulin ([place])   ?3. Nevus of toe of left foot   ?  ?-Nevus distal tip of left 5th digit. Monitor for changes on next visit. If changes noted, will refer to Dermatology for further workup. Patient in agreement. ?-Patient to continue soft, supportive shoe gear daily. ?-Mycotic toenails 1-5 bilaterally were debrided in length and girth with sterile nail nippers and dremel without incident. ?-Patient/POA to call should there be question/concern in the interim.  ? ?Return in about 9 weeks (around 01/06/2022). ? ?Marzetta Board, DPM  ?

## 2021-12-04 ENCOUNTER — Other Ambulatory Visit: Payer: Self-pay | Admitting: Internal Medicine

## 2021-12-17 ENCOUNTER — Encounter: Payer: Self-pay | Admitting: Internal Medicine

## 2021-12-17 ENCOUNTER — Ambulatory Visit: Payer: Medicare Other | Admitting: Internal Medicine

## 2021-12-17 VITALS — BP 130/70 | HR 74 | Temp 97.7°F | Ht 75.0 in | Wt 217.4 lb

## 2021-12-17 DIAGNOSIS — E7800 Pure hypercholesterolemia, unspecified: Secondary | ICD-10-CM

## 2021-12-17 DIAGNOSIS — Z79899 Other long term (current) drug therapy: Secondary | ICD-10-CM

## 2021-12-17 DIAGNOSIS — Z6827 Body mass index (BMI) 27.0-27.9, adult: Secondary | ICD-10-CM | POA: Diagnosis not present

## 2021-12-17 NOTE — Patient Instructions (Signed)
Mediterranean Diet ?A Mediterranean diet refers to food and lifestyle choices that are based on the traditions of countries located on the [place]. It focuses on eating more fruits, vegetables, whole grains, beans, nuts, seeds, and heart-healthy fats, and eating less dairy, meat, eggs, and processed foods with added sugar, salt, and fat. This way of eating has been shown to help prevent certain conditions and improve outcomes for people who have chronic diseases, like kidney disease and heart disease. ?What are tips for following this plan? ?Reading food labels ?Check the serving size of packaged foods. For foods such as rice and pasta, the serving size refers to the amount of cooked product, not dry. ?Check the total fat in packaged foods. Avoid foods that have saturated fat or trans fats. ?Check the ingredient list for added sugars, such as corn syrup. ?Shopping ? ?Buy a variety of foods that offer a balanced diet, including: ?Fresh fruits and vegetables (produce). ?Grains, beans, nuts, and seeds. Some of these may be available in unpackaged forms or large amounts (in bulk). ?Fresh seafood. ?Poultry and eggs. ?Low-fat dairy products. ?Buy whole ingredients instead of prepackaged foods. ?Buy fresh fruits and vegetables in-season from local farmers markets. ?Buy plain frozen fruits and vegetables. ?If you do not have access to quality fresh seafood, buy precooked frozen shrimp or canned fish, such as tuna, salmon, or sardines. ?Stock your pantry so you always have certain foods on hand, such as olive oil, canned tuna, canned tomatoes, rice, pasta, and beans. ?Cooking ?Cook foods with extra-virgin olive oil instead of using butter or other vegetable oils. ?Have meat as a side dish, and have vegetables or grains as your main dish. This means having meat in small portions or adding small amounts of meat to foods like pasta or stew. ?Use beans or vegetables instead of meat in common dishes like chili or  lasagna. ?Experiment with different cooking methods. Try roasting, broiling, steaming, and saut?ing vegetables. ?Add frozen vegetables to soups, stews, pasta, or rice. ?Add nuts or seeds for added healthy fats and plant protein at each meal. You can add these to yogurt, salads, or vegetable dishes. ?Marinate fish or vegetables using olive oil, lemon juice, garlic, and fresh herbs. ?Meal planning ?Plan to eat one vegetarian meal one day each week. Try to work up to two vegetarian meals, if possible. ?Eat seafood two or more times a week. ?Have healthy snacks readily available, such as: ?Vegetable sticks with hummus. ?Greek yogurt. ?Fruit and nut trail mix. ?Eat balanced meals throughout the week. This includes: ?Fruit: 2-3 servings a day. ?Vegetables: 4-5 servings a day. ?Low-fat dairy: 2 servings a day. ?Fish, poultry, or lean meat: 1 serving a day. ?Beans and legumes: 2 or more servings a week. ?Nuts and seeds: 1-2 servings a day. ?Whole grains: 6-8 servings a day. ?Extra-virgin olive oil: 3-4 servings a day. ?Limit red meat and sweets to only a few servings a month. ?Lifestyle ? ?Cook and eat meals together with your family, when possible. ?Drink enough fluid to keep your urine pale yellow. ?Be physically active every day. This includes: ?Aerobic exercise like running or swimming. ?Leisure activities like gardening, walking, or housework. ?Get 7-8 hours of sleep each night. ?If recommended by your health care provider, drink red wine in moderation. This means 1 glass a day for nonpregnant women and 2 glasses a day for men. A glass of wine equals 5 oz (150 mL). ?What foods should I eat? ?Fruits ?Apples. Apricots. Avocado. Berries. Bananas. Cherries. Dates.   Figs. Grapes. Lemons. Melon. Oranges. Peaches. Plums. Pomegranate. ?Vegetables ?Artichokes. Beets. Broccoli. Cabbage. Carrots. Eggplant. Green beans. Chard. Kale. Spinach. Onions. Leeks. Peas. Squash. Tomatoes. Peppers. Radishes. ?Grains ?Whole-grain pasta. Brown  rice. Bulgur wheat. Polenta. Couscous. Whole-wheat bread. Oatmeal. Quinoa. ?Meats and other proteins ?Beans. Almonds. Sunflower seeds. Pine nuts. Peanuts. Cod. Salmon. Scallops. Shrimp. Tuna. Tilapia. Clams. Oysters. Eggs. Poultry without skin. ?Dairy ?Low-fat milk. Cheese. Greek yogurt. ?Fats and oils ?Extra-virgin olive oil. Avocado oil. Grapeseed oil. ?Beverages ?Water. Red wine. Herbal tea. ?Sweets and desserts ?Greek yogurt with honey. Baked apples. Poached pears. Trail mix. ?Seasonings and condiments ?Basil. Cilantro. Coriander. Cumin. Mint. Parsley. Sage. Rosemary. Tarragon. Garlic. Oregano. Thyme. Pepper. Balsamic vinegar. Tahini. Hummus. Tomato sauce. Olives. Mushrooms. ?The items listed above may not be a complete list of foods and beverages you can eat. Contact a dietitian for more information. ?What foods should I limit? ?This is a list of foods that should be eaten rarely or only on special occasions. ?Fruits ?Fruit canned in syrup. ?Vegetables ?Deep-fried potatoes (french fries). ?Grains ?Prepackaged pasta or rice dishes. Prepackaged cereal with added sugar. Prepackaged snacks with added sugar. ?Meats and other proteins ?Beef. Pork. Lamb. Poultry with skin. Hot dogs. Bacon. ?Dairy ?Ice cream. Sour cream. Whole milk. ?Fats and oils ?Butter. Canola oil. Vegetable oil. Beef fat (tallow). Lard. ?Beverages ?Juice. Sugar-sweetened soft drinks. Beer. Liquor and spirits. ?Sweets and desserts ?Cookies. Cakes. Pies. Candy. ?Seasonings and condiments ?Mayonnaise. Pre-made sauces and marinades. ?The items listed above may not be a complete list of foods and beverages you should limit. Contact a dietitian for more information. ?Summary ?The Mediterranean diet includes both food and lifestyle choices. ?Eat a variety of fresh fruits and vegetables, beans, nuts, seeds, and whole grains. ?Limit the amount of red meat and sweets that you eat. ?If recommended by your health care provider, drink red wine in moderation.  This means 1 glass a day for nonpregnant women and 2 glasses a day for men. A glass of wine equals 5 oz (150 mL). ?This information is not intended to replace advice given to you by your health care provider. Make sure you discuss any questions you have with your health care provider. ?Document Revised: 09/08/2019 Document Reviewed: 07/06/2019 ?Elsevier Patient Education ? 2023 [employer] ? ?

## 2021-12-17 NOTE — Progress Notes (Signed)
?[name],acting as a Education administrator for [name], MD.,have documented all relevant documentation on the behalf of [name], MD,as directed by  [name], MD while in the presence of [name], MD.  ?This visit occurred during the SARS-CoV-2 public health emergency.  Safety protocols were in place, including screening questions prior to the visit, additional usage of staff PPE, and extensive cleaning of exam room while observing appropriate contact time as indicated for disinfecting solutions. ? ?Subjective:  ?  ? Patient ID: Marcus Johnston , male    DOB: [birth year] , 88 y.o.   MRN: [id number] ? ? ?Chief Complaint  ?Patient presents with  ? Hyperlipidemia  ? ? ?HPI ? ?Patient presents today for a chol check. He was started on atorvastatin '20mg'$  at his last visit. He is dosing MWF. He has not had any issues with the medication. He is pleasantly surprised that he is not having any issues with meds as he has in the past.  ? ?Hyperlipidemia ?This is a chronic problem. The current episode started more than 1 year ago. Exacerbating diseases include chronic renal disease and diabetes. Pertinent negatives include no leg pain, myalgias or shortness of breath. Current antihyperlipidemic treatment includes statins. Risk factors for coronary artery disease include hypertension, diabetes mellitus and dyslipidemia.   ? ?Past Medical History:  ?Diagnosis Date  ? Atrial fibrillation ([place])   ? Atypical atrial flutter ([place]) 10/17/2018  ? Diabetes mellitus without complication ([place])   ? Diabetic retinopathy ([place])   ? NPDR OU  ? High cholesterol   ? History of prostate cancer   ? HTN (hypertension)   ? Hypertensive retinopathy   ? OU  ? Renal insufficiency   ? Shingles   ?  ? ?Family History  ?Problem Relation Age of Onset  ? Cancer Mother   ? Cancer Father   ? ? ? ?Current Outpatient Medications:  ?  acetaminophen (TYLENOL) 325 MG tablet, Take 650 mg by mouth every 6 (six) hours as needed for mild pain or  fever., Disp: , Rfl:  ?  amLODipine (NORVASC) 5 MG tablet, Take 2 tablets by mouth daily., Disp: , Rfl:  ?  atorvastatin (LIPITOR) 20 MG tablet, Take 1 tablet (20 mg total) by mouth every Monday, Wednesday, and Friday., Disp: 15 tablet, Rfl: 1 ?  carvedilol (COREG) 12.5 MG tablet, TAKE 1 TABLET BY MOUTH  TWICE DAILY WITH MEALS, Disp: 180 tablet, Rfl: 3 ?  Cholecalciferol (VITAMIN D3) 50 MCG (2000 UT) capsule, Take 1 capsule by mouth daily., Disp: , Rfl:  ?  dapagliflozin propanediol (FARXIGA) 5 MG TABS tablet, 1 tablet, Disp: , Rfl:  ?  doxazosin (CARDURA) 2 MG tablet, Take 2 mg by mouth daily., Disp: , Rfl:  ?  fexofenadine (ALLEGRA) 180 MG tablet, Take 180 mg by mouth daily as needed (allergies.). , Disp: , Rfl:  ?  hydrochlorothiazide (HYDRODIURIL) 25 MG tablet, TAKE 1 TABLET BY MOUTH  DAILY, Disp: 90 tablet, Rfl: 1 ?  insulin lispro protamine-lispro (HUMALOG 50/50 MIX) (50-50) 100 UNIT/ML SUSP injection, Inject 10-15 Units into the skin See admin instructions. Inject 25 units subcutaneously with breakfast and 22 units with supper, Disp: , Rfl:  ?  Insulin Syringe-Needle U-100 (EASY TOUCH INSULIN SYRINGE) 30G X 5/16" 0.5 ML MISC, USE 3 TIMES DAILY, Disp: 270 each, Rfl: 3 ?  losartan-hydrochlorothiazide (HYZAAR) 100-25 MG tablet, TAKE 1 TABLET BY MOUTH  DAILY, Disp: 90 tablet, Rfl: 3 ?  metFORMIN (GLUCOPHAGE-XR) 500 MG  24 hr tablet, Take 1,000 mg by mouth 2 (two) times daily with a meal. , Disp: , Rfl:  ?  aspirin 81 MG chewable tablet, 1 tablet (Patient not taking: Reported on 12/17/2021), Disp: , Rfl:  ?  doxazosin (CARDURA) 4 MG tablet, TAKE 1 TABLET BY MOUTH AT  BEDTIME (Patient not taking: Reported on 12/17/2021), Disp: 90 tablet, Rfl: 3  ? ?Allergies  ?Allergen Reactions  ? Bidil [Isosorb Dinitrate-Hydralazine] Other (See Comments)  ?  Severe hypotension episode after administering one dose of Bidil 20-37.5 mg on 09/25/2019.   ? Dapagliflozin Other (See Comments)  ? Pneumococcal Vac Polyvalent Other (See Comments)   ? Statins Other (See Comments)  ?  Leg pain  ?  ? ?Review of Systems  ?Constitutional: Negative.   ?Eyes: Negative.   ?Respiratory: Negative.  Negative for shortness of breath.   ?Cardiovascular: Negative.   ?Gastrointestinal: Negative.   ?Endocrine: Negative for polydipsia, polyphagia and polyuria.  ?Musculoskeletal: Negative.  Negative for myalgias.  ?Skin: Negative.   ?Neurological: Negative.   ?Psychiatric/Behavioral: Negative.     ? ?Today's Vitals  ? 12/17/21 1135  ?BP: 130/70  ?Pulse: 74  ?Temp: 97.7 ?F (36.5 ?C)  ?Weight: 217 lb 6.4 oz (98.6 kg)  ?Height: '6\' 3"'$  (1.905 m)  ?PainSc: 0-No pain  ? ?Body mass index is 27.17 kg/m?.  ?Wt Readings from Last 3 Encounters:  ?12/17/21 217 lb 6.4 oz (98.6 kg)  ?10/15/21 218 lb (98.9 kg)  ?10/01/21 221 lb (100.2 kg)  ?  ? ?Objective:  ?Physical Exam ?Vitals and nursing note reviewed.  ?Constitutional:   ?   Appearance: Normal appearance.  ?HENT:  ?   Head: Normocephalic and atraumatic.  ?Eyes:  ?   Extraocular Movements: Extraocular movements intact.  ?Cardiovascular:  ?   Rate and Rhythm: Normal rate and regular rhythm.  ?   Heart sounds: Normal heart sounds.  ?Pulmonary:  ?   Effort: Pulmonary effort is normal.  ?   Breath sounds: Normal breath sounds.  ?Musculoskeletal:  ?   Cervical back: Normal range of motion.  ?Skin: ?   General: Skin is warm.  ?Neurological:  ?   General: No focal deficit present.  ?   Mental Status: He is alert.  ?Psychiatric:     ?   Mood and Affect: Mood normal.  ?  ? ?   ?Assessment And Plan:  ?   ?1. Pure hypercholesterolemia ?Comments: I will check lipid panel today, along with ALT. LDL goal<70. If above goal, I plan to increase to '40mg'$  w/ MWF. He agrees w/ treatment plan.  ?- Lipid panel ?- ALT ? ?2. BMI 27.0-27.9,adult ?Comments: He is encouraged to aim for at least 150 minutes of exercise per week.  ? ?3. Drug therapy ?  ?Patient was given opportunity to ask questions. Patient verbalized understanding of the plan and was able to repeat  key elements of the plan. All questions were answered to their satisfaction.  ? ?I, [name], MD, have reviewed all documentation for this visit. The documentation on 12/17/21 for the exam, diagnosis, procedures, and orders are all accurate and complete.  ? ?IF YOU HAVE BEEN REFERRED TO A SPECIALIST, IT MAY TAKE 1-2 WEEKS TO SCHEDULE/PROCESS THE REFERRAL. IF YOU HAVE NOT HEARD FROM US/SPECIALIST IN TWO WEEKS, PLEASE GIVE [place] A CALL AT [phone].  ? ?THE PATIENT IS ENCOURAGED TO PRACTICE SOCIAL DISTANCING DUE TO THE COVID-19 PANDEMIC.   ?

## 2021-12-18 LAB — LIPID PANEL
Chol/HDL Ratio: 2 ratio (ref 0.0–5.0)
Cholesterol, Total: 163 mg/dL (ref 100–199)
HDL: 81 mg/dL (ref >39)
LDL Chol Calc (NIH): 70 mg/dL (ref 0–99)
Triglycerides: 58 mg/dL (ref 0–149)
VLDL Cholesterol Cal: 12 mg/dL (ref 5–40)

## 2021-12-18 LAB — ALT: ALT: 8 IU/L (ref 0–44)

## 2022-01-06 ENCOUNTER — Encounter: Payer: Self-pay | Admitting: Podiatry

## 2022-01-06 ENCOUNTER — Ambulatory Visit: Payer: Medicare Other | Admitting: Podiatry

## 2022-01-06 DIAGNOSIS — E119 Type 2 diabetes mellitus without complications: Secondary | ICD-10-CM | POA: Diagnosis not present

## 2022-01-06 DIAGNOSIS — Z794 Long term (current) use of insulin: Secondary | ICD-10-CM

## 2022-01-06 DIAGNOSIS — B351 Tinea unguium: Secondary | ICD-10-CM | POA: Diagnosis not present

## 2022-01-06 DIAGNOSIS — M79676 Pain in unspecified toe(s): Secondary | ICD-10-CM

## 2022-01-11 NOTE — Progress Notes (Signed)
  Subjective:  Patient ID: Marcus Johnston, male    DOB: 1931/05/15,  MRN: [id number]  Marcus Johnston presents to clinic today for preventative diabetic foot care and painful elongated mycotic toenails 1-5 bilaterally which are tender when wearing enclosed shoe gear. Pain is relieved with periodic professional debridement.  New problem(s): None.   PCP is Glendale Chard, MD , and last visit was October 15, 2021.  Allergies  Allergen Reactions   Bidil [Isosorb Dinitrate-Hydralazine] Other (See Comments)    Severe hypotension episode after administering one dose of Bidil 20-37.5 mg on 09/25/2019.    Dapagliflozin Other (See Comments)   Pneumococcal Vac Polyvalent Other (See Comments)   Statins Other (See Comments)    Leg pain    Review of Systems: Negative except as noted in the HPI.  Objective:  General: Marcus Johnston is a pleasant 88 y.o. African American male, WD, WN in NAD. AAO x 3.   Vascular:  Capillary refill time to digits immediate b/l. Palpable pedal pulses b/l LE. Pedal hair sparse. No pain with calf compression b/l. Lower extremity skin temperature gradient within normal limits. No edema noted b/l LE. No cyanosis or clubbing noted b/l LE.   Dermatological:  Copper colored macule 0.1 x 0.5 cm on distal tip of left 5th digit. Flat, no elevation, no palpable depth. Unchanged from prior visit.  Pedal integument with normal turgor, texture and tone b/l LE. No open wounds b/l. No interdigital macerations b/l. Toenails 1-5 b/l elongated, thickened, discolored with subungual debris. +Tenderness with dorsal palpation of nailplates. No hyperkeratotic or porokeratotic lesions present.   Incurvated nailplate  lateral border right 3rd toe . No erythema, no edema, no drainage noted. No fluctuance.   Musculoskeletal:  Muscle strength 5/5 to all lower extremity muscle groups bilaterally. No pain, crepitus or joint limitation noted with ROM bilateral LE. No gross bony deformities bilaterally.  Patient ambulates independent of any assistive aids.   Neurological:  Protective sensation intact 5/5 intact bilaterally with 10g monofilament b/l. Vibratory sensation intact b/l. Proprioception intact bilaterally. Clonus negative b/l.      08/29/2021   12:00 AM 04/16/2021    9:52 AM  Hemoglobin A1C  Hemoglobin-A1c 7.6      7.0       This result is from an external source.   Assessment/Plan: 1. Pain due to onychomycosis of toenail   2. Type 2 diabetes mellitus without complication, with long-term current use of insulin (HCC)   -Examined patient. -Patient to continue soft, supportive shoe gear daily. -Toenails 1-5 b/l were debrided in length and girth with sterile nail nippers and dremel without iatrogenic bleeding.  -Offending nail border debrided and curretaged R 3rd toe utilizing sterile nail nipper and currette. Border(s) cleansed with alcohol and TAO applied. Patient instructed to apply Neosporin Cream  to R 3rd toe once daily for 7 days. Call office if there are any concerns. -Patient/POA to call should there be question/concern in the interim.   Return in about 9 weeks (around 03/10/2022).  Marzetta Board, DPM

## 2022-01-15 ENCOUNTER — Other Ambulatory Visit: Payer: Self-pay | Admitting: Internal Medicine

## 2022-03-10 ENCOUNTER — Encounter: Payer: Self-pay | Admitting: Podiatry

## 2022-03-10 ENCOUNTER — Ambulatory Visit: Payer: Medicare Other | Admitting: Podiatry

## 2022-03-10 DIAGNOSIS — Z794 Long term (current) use of insulin: Secondary | ICD-10-CM

## 2022-03-10 DIAGNOSIS — B351 Tinea unguium: Secondary | ICD-10-CM

## 2022-03-10 DIAGNOSIS — E119 Type 2 diabetes mellitus without complications: Secondary | ICD-10-CM

## 2022-03-10 DIAGNOSIS — M79676 Pain in unspecified toe(s): Secondary | ICD-10-CM | POA: Diagnosis not present

## 2022-03-10 NOTE — Progress Notes (Signed)
  Subjective:  Patient ID: Marcus Johnston, male    DOB: Dec 07, 1930,  MRN: [id number]  Marcus Johnston presents to clinic today for preventative diabetic foot care and painful thick toenails that are difficult to trim. Pain interferes with ambulation. Aggravating factors include wearing enclosed shoe gear. Pain is relieved with periodic professional debridement.  Patient states blood glucose was 154 mg/dl today.  Last A1c was 7.6%.  New problem(s): None.   PCP is Glendale Chard, MD , and last visit was  Dec 17, 2021  Allergies  Allergen Reactions   Bidil [Isosorb Dinitrate-Hydralazine] Other (See Comments)    Severe hypotension episode after administering one dose of Bidil 20-37.5 mg on 09/25/2019.    Dapagliflozin Other (See Comments)   Pneumococcal Vac Polyvalent Other (See Comments)   Statins Other (See Comments)    Leg pain    Review of Systems: Negative except as noted in the HPI.  Objective: No changes noted in today's physical examination. General: Marcus Johnston is a pleasant 88 y.o. African American male, WD, WN in NAD. AAO x 3.   Vascular:  Capillary refill time to digits immediate b/l. Palpable pedal pulses b/l LE. Pedal hair sparse. No pain with calf compression b/l. Lower extremity skin temperature gradient within normal limits. No edema noted b/l LE. No cyanosis or clubbing noted b/l LE.   Dermatological:  Copper colored macule 0.1 x 0.5 cm on distal tip of left 5th digit. Flat, no elevation, no palpable depth. Unchanged from prior visit.  Pedal integument with normal turgor, texture and tone b/l LE. No open wounds b/l. No interdigital macerations b/l. Toenails 1-5 b/l elongated, thickened, discolored with subungual debris. +Tenderness with dorsal palpation of nailplates. No hyperkeratotic or porokeratotic lesions present.   Incurvated nailplate lateral border right 3rd toe. No erythema, no edema, no drainage noted. No fluctuance.   Musculoskeletal:  Muscle strength  5/5 to all lower extremity muscle groups bilaterally. No pain, crepitus or joint limitation noted with ROM bilateral LE. No gross bony deformities bilaterally. Patient ambulates independent of any assistive aids.   Neurological:  Protective sensation intact 5/5 intact bilaterally with 10g monofilament b/l. Vibratory sensation intact b/l. Proprioception intact bilaterally. Clonus negative b/l.     08/29/2021   12:00 AM 04/16/2021    9:52 AM  Hemoglobin A1C  Hemoglobin-A1c 7.6     7.0      This result is from an external source.   Assessment/Plan: 1. Pain due to onychomycosis of toenail   2. Type 2 diabetes mellitus without complication, with long-term current use of insulin ([place])      -Patient was evaluated and treated. All patient's and/or POA's questions/concerns answered on today's visit. -No new findings. No new orders. -Continue foot and shoe inspections daily. Monitor blood glucose per PCP/Endocrinologist's recommendations. -Mycotic toenails 1-5 bilaterally were debrided in length and girth with sterile nail nippers and dremel without incident. -Patient/POA to call should there be question/concern in the interim.   Return in about 3 months (around 06/10/2022).  Marzetta Board, DPM

## 2022-03-13 ENCOUNTER — Ambulatory Visit: Payer: Medicare Other | Admitting: Podiatry

## 2022-03-16 ENCOUNTER — Other Ambulatory Visit: Payer: Self-pay | Admitting: Internal Medicine

## 2022-04-08 ENCOUNTER — Other Ambulatory Visit: Payer: Self-pay | Admitting: Internal Medicine

## 2022-04-28 ENCOUNTER — Encounter: Payer: Medicare Other | Admitting: Internal Medicine

## 2022-04-29 ENCOUNTER — Ambulatory Visit: Payer: Medicare Other

## 2022-05-01 ENCOUNTER — Encounter: Payer: Self-pay | Admitting: Internal Medicine

## 2022-05-01 ENCOUNTER — Ambulatory Visit (INDEPENDENT_AMBULATORY_CARE_PROVIDER_SITE_OTHER): Payer: Medicare Other | Admitting: Internal Medicine

## 2022-05-01 VITALS — BP 152/86 | HR 77 | Temp 98.0°F | Ht 75.0 in | Wt 209.2 lb

## 2022-05-01 DIAGNOSIS — E1159 Type 2 diabetes mellitus with other circulatory complications: Secondary | ICD-10-CM

## 2022-05-01 DIAGNOSIS — Z23 Encounter for immunization: Secondary | ICD-10-CM | POA: Diagnosis not present

## 2022-05-01 DIAGNOSIS — E1122 Type 2 diabetes mellitus with diabetic chronic kidney disease: Secondary | ICD-10-CM

## 2022-05-01 DIAGNOSIS — R0982 Postnasal drip: Secondary | ICD-10-CM

## 2022-05-01 DIAGNOSIS — I1310 Hypertensive heart and chronic kidney disease without heart failure, with stage 1 through stage 4 chronic kidney disease, or unspecified chronic kidney disease: Secondary | ICD-10-CM

## 2022-05-01 DIAGNOSIS — N1831 Chronic kidney disease, stage 3a: Secondary | ICD-10-CM | POA: Diagnosis not present

## 2022-05-01 DIAGNOSIS — Z6826 Body mass index (BMI) 26.0-26.9, adult: Secondary | ICD-10-CM

## 2022-05-01 DIAGNOSIS — R42 Dizziness and giddiness: Secondary | ICD-10-CM | POA: Diagnosis not present

## 2022-05-01 DIAGNOSIS — Z0000 Encounter for general adult medical examination without abnormal findings: Secondary | ICD-10-CM

## 2022-05-01 DIAGNOSIS — E7800 Pure hypercholesterolemia, unspecified: Secondary | ICD-10-CM

## 2022-05-01 DIAGNOSIS — Z794 Long term (current) use of insulin: Secondary | ICD-10-CM

## 2022-05-01 LAB — POCT URINALYSIS DIPSTICK
Bilirubin, UA: NEGATIVE
Glucose, UA: POSITIVE — AB
Ketones, UA: NEGATIVE
Leukocytes, UA: NEGATIVE
Nitrite, UA: NEGATIVE
Protein, UA: NEGATIVE
Spec Grav, UA: 1.01 (ref 1.010–1.025)
Urobilinogen, UA: 0.2 E.U./dL
pH, UA: 5.5 (ref 5.0–8.0)

## 2022-05-01 NOTE — Patient Instructions (Signed)
Health Maintenance, Male Adopting a healthy lifestyle and getting preventive care are important in promoting health and wellness. Ask your health care provider about: The right schedule for you to have regular tests and exams. Things you can do on your own to prevent diseases and keep yourself healthy. What should I know about diet, weight, and exercise? Eat a healthy diet  Eat a diet that includes plenty of vegetables, fruits, low-fat dairy products, and lean protein. Do not eat a lot of foods that are high in solid fats, added sugars, or sodium. Maintain a healthy weight Body mass index (BMI) is a measurement that can be used to identify possible weight problems. It estimates body fat based on height and weight. Your health care provider can help determine your BMI and help you achieve or maintain a healthy weight. Get regular exercise Get regular exercise. This is one of the most important things you can do for your health. Most adults should: Exercise for at least 150 minutes each week. The exercise should increase your heart rate and make you sweat (moderate-intensity exercise). Do strengthening exercises at least twice a week. This is in addition to the moderate-intensity exercise. Spend less time sitting. Even light physical activity can be beneficial. Watch cholesterol and blood lipids Have your blood tested for lipids and cholesterol at 86 years of age, then have this test every 5 years. You may need to have your cholesterol levels checked more often if: Your lipid or cholesterol levels are high. You are older than 86 years of age. You are at high risk for heart disease. What should I know about cancer screening? Many types of cancers can be detected early and may often be prevented. Depending on your health history and family history, you may need to have cancer screening at various ages. This may include screening for: Colorectal cancer. Prostate cancer. Skin cancer. Lung  cancer. What should I know about heart disease, diabetes, and high blood pressure? Blood pressure and heart disease High blood pressure causes heart disease and increases the risk of stroke. This is more likely to develop in people who have high blood pressure readings or are overweight. Talk with your health care provider about your target blood pressure readings. Have your blood pressure checked: Every 3-5 years if you are 18-39 years of age. Every year if you are 40 years old or older. If you are between the ages of 65 and 75 and are a current or former smoker, ask your health care provider if you should have a one-time screening for abdominal aortic aneurysm (AAA). Diabetes Have regular diabetes screenings. This checks your fasting blood sugar level. Have the screening done: Once every three years after age 45 if you are at a normal weight and have a low risk for diabetes. More often and at a younger age if you are overweight or have a high risk for diabetes. What should I know about preventing infection? Hepatitis B If you have a higher risk for hepatitis B, you should be screened for this virus. Talk with your health care provider to find out if you are at risk for hepatitis B infection. Hepatitis C Blood testing is recommended for: Everyone born from 1945 through 1965. Anyone with known risk factors for hepatitis C. Sexually transmitted infections (STIs) You should be screened each year for STIs, including gonorrhea and chlamydia, if: You are sexually active and are younger than 86 years of age. You are older than 86 years of age and your   health care provider tells you that you are at risk for this type of infection. Your sexual activity has changed since you were last screened, and you are at increased risk for chlamydia or gonorrhea. Ask your health care provider if you are at risk. Ask your health care provider about whether you are at high risk for HIV. Your health care provider  may recommend a prescription medicine to help prevent HIV infection. If you choose to take medicine to prevent HIV, you should first get tested for HIV. You should then be tested every 3 months for as long as you are taking the medicine. Follow these instructions at home: Alcohol use Do not drink alcohol if your health care provider tells you not to drink. If you drink alcohol: Limit how much you have to 0-2 drinks a day. Know how much alcohol is in your drink. In the [place] one drink equals one 12 oz bottle of beer (355 mL), one 5 oz glass of wine (148 mL), or one 1 oz glass of hard liquor (44 mL). Lifestyle Do not use any products that contain nicotine or tobacco. These products include cigarettes, chewing tobacco, and vaping devices, such as e-cigarettes. If you need help quitting, ask your health care provider. Do not use street drugs. Do not share needles. Ask your health care provider for help if you need support or information about quitting drugs. General instructions Schedule regular health, dental, and eye exams. Stay current with your vaccines. Tell your health care provider if: You often feel depressed. You have ever been abused or do not feel safe at home. Summary Adopting a healthy lifestyle and getting preventive care are important in promoting health and wellness. Follow your health care provider's instructions about healthy diet, exercising, and getting tested or screened for diseases. Follow your health care provider's instructions on monitoring your cholesterol and blood pressure. This information is not intended to replace advice given to you by your health care provider. Make sure you discuss any questions you have with your health care provider. Document Revised: 12/23/2020 Document Reviewed: 12/23/2020 Elsevier Patient Education  2023 [employer] Inc[employer]  

## 2022-05-01 NOTE — Progress Notes (Signed)
Marcus Johnston,acting as a Education administrator for Marcus Greenland, MD.,have documented all relevant documentation on the behalf of Marcus Greenland, MD,as directed by  Marcus Greenland, MD while in the presence of Marcus Greenland, MD.   Subjective:     Patient ID: Marcus Johnston , male    DOB: August 09, 1931 , 88 y.o.   MRN: [id number]   Chief Complaint  Patient presents with   Annual Exam   Diabetes   Hypertension    HPI  Pt presents for full physical exam. He states that he is compliant with medication. He is followed by Dr [name] as his cardiologist. He denies headaches, chest pain and shortness of breath. He does not have any complaints at this time.   Diabetes He presents for his follow-up diabetic visit. He has type 2 diabetes mellitus. His disease course has been stable. Hypoglycemia symptoms include dizziness. Pertinent negatives for diabetes include no blurred vision. There are no hypoglycemic complications. Risk factors for coronary artery disease include diabetes mellitus, dyslipidemia, hypertension and male sex. He is compliant with treatment some of the time. He is following a diabetic diet. He participates in exercise intermittently. An ACE inhibitor/angiotensin II receptor blocker is being taken.  Hypertension This is a chronic problem. The current episode started more than 1 year ago. The problem has been gradually improving since onset. The problem is controlled. Pertinent negatives include no blurred vision. Risk factors for coronary artery disease include diabetes mellitus, dyslipidemia, male gender and sedentary lifestyle. There are no compliance problems.      Past Medical History:  Diagnosis Date   Atrial fibrillation ([place])    Atypical atrial flutter ([place]) 10/17/2018   Diabetes mellitus without complication (HCC)    Diabetic retinopathy (HCC)    NPDR OU   High cholesterol    History of prostate cancer    HTN (hypertension)    Hypertensive retinopathy    OU   Renal  insufficiency    Shingles      Family History  Problem Relation Age of Onset   Cancer Mother    Cancer Father      Current Outpatient Medications:    acetaminophen (TYLENOL) 325 MG tablet, Take 650 mg by mouth every 6 (six) hours as needed for mild pain or fever., Disp: , Rfl:    amLODipine (NORVASC) 5 MG tablet, Take 2 tablets by mouth daily., Disp: , Rfl:    aspirin 81 MG chewable tablet, , Disp: , Rfl:    atorvastatin (LIPITOR) 20 MG tablet, TAKE 1 TABLET (20 MG TOTAL) BY MOUTH EVERY MONDAY, WEDNESDAY AND FRIDAY, Disp: 15 tablet, Rfl: 1   carvedilol (COREG) 12.5 MG tablet, TAKE 1 TABLET BY MOUTH  TWICE DAILY WITH MEALS, Disp: 180 tablet, Rfl: 3   Cholecalciferol (VITAMIN D3) 50 MCG (2000 UT) capsule, Take 1 capsule by mouth daily., Disp: , Rfl:    dapagliflozin propanediol (FARXIGA) 5 MG TABS tablet, 1 tablet, Disp: , Rfl:    doxazosin (CARDURA) 2 MG tablet, Take 2 mg by mouth daily., Disp: , Rfl:    fexofenadine (ALLEGRA) 180 MG tablet, Take 180 mg by mouth daily as needed (allergies.). , Disp: , Rfl:    hydrochlorothiazide (HYDRODIURIL) 25 MG tablet, TAKE 1 TABLET BY MOUTH DAILY, Disp: 90 tablet, Rfl: 3   insulin lispro protamine-lispro (HUMALOG 50/50 MIX) (50-50) 100 UNIT/ML SUSP injection, Inject 10-15 Units into the skin See admin instructions. Inject 25 units subcutaneously with breakfast and 22 units with supper, Disp: ,  Rfl:    Insulin Syringe-Needle U-100 (EASY TOUCH INSULIN SYRINGE) 30G X 5/16" 0.5 ML MISC, USE 3 TIMES DAILY, Disp: 270 each, Rfl: 3   losartan-hydrochlorothiazide (HYZAAR) 100-25 MG tablet, TAKE 1 TABLET BY MOUTH  DAILY, Disp: 90 tablet, Rfl: 3   metFORMIN (GLUCOPHAGE-XR) 500 MG 24 hr tablet, Take 1,000 mg by mouth 2 (two) times daily with a meal. , Disp: , Rfl:    Allergies  Allergen Reactions   Bidil [Isosorb Dinitrate-Hydralazine] Other (See Comments)    Severe hypotension episode after administering one dose of Bidil 20-37.5 mg on 09/25/2019.     Dapagliflozin Other (See Comments)   Pneumococcal Vac Polyvalent Other (See Comments)   Statins Other (See Comments)    Leg pain     Men's preventive visit. Patient Health Questionnaire (PHQ-2) is  [place] Office Visit from 05/01/2022 in [facility]  PHQ-2 Total Score 0     . Patient is on a healthy diet. Marital status: Married. Relevant history for alcohol use is:  Social History   Substance and Sexual Activity  Alcohol Use No  . Relevant history for tobacco use is:  Social History   Tobacco Use  Smoking Status Never  Smokeless Tobacco Never  .   Review of Systems  Constitutional: Negative.   HENT:  Positive for postnasal drip.   Eyes: Negative.  Negative for blurred vision.  Respiratory: Negative.    Cardiovascular: Negative.   Gastrointestinal: Negative.   Endocrine: Negative.   Genitourinary: Negative.   Musculoskeletal: Negative.   Skin: Negative.   Allergic/Immunologic: Negative.   Neurological:  Positive for dizziness.  Hematological: Negative.   Psychiatric/Behavioral: Negative.       Today's Vitals   05/01/22 1418 05/01/22 1515 05/01/22 1517 05/01/22 1519  BP: 132/80 (!) 140/80 (!) 150/80 (!) 152/86  Pulse: 73 66 90 77  Temp: 98 F (36.7 C)     Weight: 209 lb 3.2 oz (94.9 kg)     Height: 6' 3"  (1.905 m)     PainSc: 0-No pain      Body mass index is 26.15 kg/m.  Wt Readings from Last 3 Encounters:  05/07/22 210 lb (95.3 kg)  05/01/22 209 lb 3.2 oz (94.9 kg)  12/17/21 217 lb 6.4 oz (98.6 kg)     Objective:  Physical Exam Vitals and nursing note reviewed.  Constitutional:      Appearance: Normal appearance.  HENT:     Head: Normocephalic and atraumatic.     Right Ear: Tympanic membrane, ear canal and external ear normal.     Left Ear: Tympanic membrane, ear canal and external ear normal.     Nose:     Comments: Masked     Mouth/Throat:     Comments: Masked  Eyes:     Extraocular Movements: Extraocular  movements intact.     Conjunctiva/sclera: Conjunctivae normal.     Pupils: Pupils are equal, round, and reactive to light.  Cardiovascular:     Rate and Rhythm: Normal rate and regular rhythm.     Pulses:          Dorsalis pedis pulses are 1+ on the right side and 1+ on the left side.     Heart sounds: Normal heart sounds.  Pulmonary:     Effort: Pulmonary effort is normal.     Breath sounds: Normal breath sounds.  Chest:  Breasts:    Right: Normal. No swelling, bleeding, inverted nipple, mass or nipple discharge.  Left: Normal. No swelling, bleeding, inverted nipple, mass or nipple discharge.  Abdominal:     General: Bowel sounds are normal.     Palpations: Abdomen is soft.  Genitourinary:    Comments: Deferred  Musculoskeletal:        General: Normal range of motion.     Cervical back: Normal range of motion and neck supple.  Feet:     Right foot:     Protective Sensation: 5 sites tested.  5 sites sensed.     Skin integrity: Dry skin present.     Toenail Condition: Right toenails are long.     Left foot:     Protective Sensation: 5 sites tested.  5 sites sensed.     Skin integrity: Dry skin present.     Toenail Condition: Left toenails are long.  Skin:    General: Skin is warm.  Neurological:     General: No focal deficit present.     Mental Status: He is alert.  Psychiatric:        Mood and Affect: Mood normal.        Behavior: Behavior normal.     Assessment And Plan:    1. Encounter for general adult medical examination w/o abnormal findings Comments: A full exam was performed. DRE deferred, per patient request. PATIENT IS ADVISED TO GET 30-45 MINUTES REGULAR EXERCISE NO LESS THAN FOUR TO FIVE DAYS PER WEEK - BOTH WEIGHTBEARING EXERCISES AND AEROBIC ARE RECOMMENDED.  PATIENT IS ADVISED TO FOLLOW A HEALTHY DIET WITH AT LEAST SIX FRUITS/VEGGIES PER DAY, DECREASE INTAKE OF RED MEAT, AND TO INCREASE FISH INTAKE TO TWO DAYS PER WEEK.  MEATS/FISH SHOULD NOT BE FRIED,  BAKED OR BROILED IS PREFERABLE.  IT IS ALSO IMPORTANT TO CUT BACK ON YOUR SUGAR INTAKE. PLEASE AVOID ANYTHING WITH ADDED SUGAR, CORN SYRUP OR OTHER SWEETENERS. IF YOU MUST USE A SWEETENER, YOU CAN TRY STEVIA. IT IS ALSO IMPORTANT TO AVOID ARTIFICIALLY SWEETENERS AND DIET BEVERAGES. LASTLY, I SUGGEST WEARING SPF 50 SUNSCREEN ON EXPOSED PARTS AND ESPECIALLY WHEN IN THE DIRECT SUNLIGHT FOR AN EXTENDED PERIOD OF TIME.  PLEASE AVOID FAST FOOD RESTAURANTS AND INCREASE YOUR WATER INTAKE.   2. Type 2 diabetes mellitus with stage 3a chronic kidney disease, with long-term current use of insulin (HCC) Comments: Chronic, diabetic foot exam was performed. I will refer him for renal u/s for CKD. He will c/w Endo f/u as well. Recent Endo notes reviewed. I reviewed patient's medical record, external notes, lab results, and imaging reports. As I have discussed with the patient in detail, compliance with diabetes medications, meal planning, exercise, and self-monitoring regimens are essential to improving glucose control. Target HgbA1C is 7.0%. We discussed blood sugar goals including decreasing HgbA1C to target, fasting & premeal blood sugars between 80-130, and 2 hr post meal blood sugars <160. Emphasized that for every 1% that the HgbA1C is above this target range, risk of microvascular complications, including retinopathy, nephropathy, and neuropathy increase by 30%, and risk of macrovascular disease, including heart attack, peripheral vascular disease, and stroke increased by 15%. We discussed carbohydrates and their influence on blood sugars and cited examples of common higher carb foods to include junk food, bread, rice, pasta, corn, potatoes, fruit, and fruit juices to name a few. We discussed estimating carbohydrate portion sizes, the importance of meal planning and balancing carbohydrate intake. The positive impact of an active lifestyle on diabetes and health explained, and goals set to incorporate more activity and  exercise into weekly  schedule. Hypoglycemia recognition, prevention, and treatment discussed with patient. If patient's BG is low, discussed eating 15g of carbs and waiting 15 minutes and rechecking BG, until BG is above 70. If BG doesn't improve to normal, patient is to call office or doctor on call if after hours. - CBC no Diff - Hemoglobin A1c - CMP14+EGFR - US Renal; Future  3. Malignant HTN with heart disease, w/o CHF, with chronic kidney disease Comments: Chronic, uncontrolled.  He has not taken all of his meds today. He will c/w amlodipine 17m,carvedilol 12.574mtwice daily and losartan/hct 100/2540maily for now. He will rto in 2-3 weeks for nurse visit. He is encouraged to follow low sodium diet.  - POCT Urinalysis Dipstick (81002) - Microalbumin / Creatinine Urine Ratio - EKG 12-Lead  4. Dizziness Comments: I suspect this may be related to his elevated blood pressure. He is encouraged to stay well hydrated and to decrease his intake of salty foods.   5. Postnasal drip Comments: He was given samples of Zyrtec to take INSTEAD of fexofenadine once daily. Encouraged to limit his dairy intake.   6. Pure hypercholesterolemia Comments: Chronic, LDL goal <70. He is intolerant of daily dosing of statins. Currently on atorvastatin MWF dosing.   7. BMI 26.0-26.9,adult Comments: He is encouraged to aim for at least 150 minutes of exercise per week.   8. Immunization due - Flu Vaccine QUAD High Dose(Fluad)  Patient was given opportunity to ask questions. Patient verbalized understanding of the plan and was able to repeat key elements of the plan. All questions were answered to their satisfaction.   I, RobMaximino GreenlandD, have reviewed all documentation for this visit. The documentation on 05/01/22 for the exam, diagnosis, procedures, and orders are all accurate and complete.   THE PATIENT IS ENCOURAGED TO PRACTICE SOCIAL DISTANCING DUE TO THE COVID-19 PANDEMIC.

## 2022-05-02 LAB — CBC
Hematocrit: 42.5 % (ref 37.5–51.0)
Hemoglobin: 13.8 g/dL (ref 13.0–17.7)
MCH: 27.7 pg (ref 26.6–33.0)
MCHC: 32.5 g/dL (ref 31.5–35.7)
MCV: 85 fL (ref 79–97)
Platelets: 163 10*3/uL (ref 150–450)
RBC: 4.99 x10E6/uL (ref 4.14–5.80)
RDW: 13.6 % (ref 11.6–15.4)
WBC: 4.2 10*3/uL (ref 3.4–10.8)

## 2022-05-02 LAB — CMP14+EGFR
ALT: 9 IU/L (ref 0–44)
AST: 12 IU/L (ref 0–40)
Albumin/Globulin Ratio: 1.8 (ref 1.2–2.2)
Albumin: 4.6 g/dL (ref 3.7–4.7)
Alkaline Phosphatase: 85 IU/L (ref 44–121)
BUN/Creatinine Ratio: 16 (ref 10–24)
BUN: 22 mg/dL (ref 8–27)
Bilirubin Total: 0.5 mg/dL (ref 0.0–1.2)
CO2: 21 mmol/L (ref 20–29)
Calcium: 10 mg/dL (ref 8.6–10.2)
Chloride: 100 mmol/L (ref 96–106)
Creatinine, Ser: 1.36 mg/dL — ABNORMAL HIGH (ref 0.76–1.27)
Globulin, Total: 2.5 g/dL (ref 1.5–4.5)
Glucose: 146 mg/dL — ABNORMAL HIGH (ref 70–99)
Potassium: 4.4 mmol/L (ref 3.5–5.2)
Sodium: 139 mmol/L (ref 134–144)
Total Protein: 7.1 g/dL (ref 6.0–8.5)
eGFR: 50 mL/min/{1.73_m2} — ABNORMAL LOW (ref >59)

## 2022-05-02 LAB — HEMOGLOBIN A1C
Est. average glucose Bld gHb Est-mCnc: 189 mg/dL
Hgb A1c MFr Bld: 8.2 % — ABNORMAL HIGH (ref 4.8–5.6)

## 2022-05-03 LAB — MICROALBUMIN / CREATININE URINE RATIO
Creatinine, Urine: 49.9 mg/dL
Microalb/Creat Ratio: 71 mg/g creat — ABNORMAL HIGH (ref 0–29)
Microalbumin, Urine: 35.3 ug/mL

## 2022-05-07 ENCOUNTER — Ambulatory Visit (INDEPENDENT_AMBULATORY_CARE_PROVIDER_SITE_OTHER): Payer: Medicare Other

## 2022-05-07 VITALS — Ht 75.0 in | Wt 210.0 lb

## 2022-05-07 DIAGNOSIS — Z0000 Encounter for general adult medical examination without abnormal findings: Secondary | ICD-10-CM | POA: Diagnosis not present

## 2022-05-07 NOTE — Patient Instructions (Signed)
Mr. Marcus Johnston , Thank you for taking time to come for your Medicare Wellness Visit. I appreciate your ongoing commitment to your health goals. Please review the following plan we discussed and let me know if I can assist you in the future.   Screening recommendations/referrals: Colonoscopy: not required Recommended yearly ophthalmology/optometry visit for glaucoma screening and checkup Recommended yearly dental visit for hygiene and checkup  Vaccinations: Influenza vaccine: completed 05/01/2022 Pneumococcal vaccine: decline Tdap vaccine: decline Shingles vaccine: decline   Covid-19:  07/08/2020, 10/13/2019, 09/22/2019  Advanced directives: Advance directive discussed with you today.   Conditions/risks identified: none  Next appointment: Follow up in one year for your annual wellness visit.   Preventive Care 106 Years and Older, Male Preventive care refers to lifestyle choices and visits with your health care provider that can promote health and wellness. What does preventive care include? A yearly physical exam. This is also called an annual well check. Dental exams once or twice a year. Routine eye exams. Ask your health care provider how often you should have your eyes checked. Personal lifestyle choices, including: Daily care of your teeth and gums. Regular physical activity. Eating a healthy diet. Avoiding tobacco and drug use. Limiting alcohol use. Practicing safe sex. Taking low doses of aspirin every day. Taking vitamin and mineral supplements as recommended by your health care provider. What happens during an annual well check? The services and screenings done by your health care provider during your annual well check will depend on your age, overall health, lifestyle risk factors, and family history of disease. Counseling  Your health care provider may ask you questions about your: Alcohol use. Tobacco use. Drug use. Emotional well-being. Home and relationship  well-being. Sexual activity. Eating habits. History of falls. Memory and ability to understand (cognition). Work and work Statistician. Screening  You may have the following tests or measurements: Height, weight, and BMI. Blood pressure. Lipid and cholesterol levels. These may be checked every 5 years, or more frequently if you are over 21 years old. Skin check. Lung cancer screening. You may have this screening every year starting at age 61 if you have a 30-pack-year history of smoking and currently smoke or have quit within the past 15 years. Fecal occult blood test (FOBT) of the stool. You may have this test every year starting at age 10. Flexible sigmoidoscopy or colonoscopy. You may have a sigmoidoscopy every 5 years or a colonoscopy every 10 years starting at age 103. Prostate cancer screening. Recommendations will vary depending on your family history and other risks. Hepatitis C blood test. Hepatitis B blood test. Sexually transmitted disease (STD) testing. Diabetes screening. This is done by checking your blood sugar (glucose) after you have not eaten for a while (fasting). You may have this done every 1-3 years. Abdominal aortic aneurysm (AAA) screening. You may need this if you are a current or former smoker. Osteoporosis. You may be screened starting at age 32 if you are at high risk. Talk with your health care provider about your test results, treatment options, and if necessary, the need for more tests. Vaccines  Your health care provider may recommend certain vaccines, such as: Influenza vaccine. This is recommended every year. Tetanus, diphtheria, and acellular pertussis (Tdap, Td) vaccine. You may need a Td booster every 10 years. Zoster vaccine. You may need this after age 49. Pneumococcal 13-valent conjugate (PCV13) vaccine. One dose is recommended after age 37. Pneumococcal polysaccharide (PPSV23) vaccine. One dose is recommended after age 32. Talk  to your health care  provider about which screenings and vaccines you need and how often you need them. This information is not intended to replace advice given to you by your health care provider. Make sure you discuss any questions you have with your health care provider. Document Released: 08/30/2015 Document Revised: 04/22/2016 Document Reviewed: 06/04/2015 Elsevier Interactive Patient Education  2017 [place] Prevention in the Home Falls can cause injuries. They can happen to people of all ages. There are many things you can do to make your home safe and to help prevent falls. What can I do on the outside of my home? Regularly fix the edges of walkways and driveways and fix any cracks. Remove anything that might make you trip as you walk through a door, such as a raised step or threshold. Trim any bushes or trees on the path to your home. Use bright outdoor lighting. Clear any walking paths of anything that might make someone trip, such as rocks or tools. Regularly check to see if handrails are loose or broken. Make sure that both sides of any steps have handrails. Any raised decks and porches should have guardrails on the edges. Have any leaves, snow, or ice cleared regularly. Use sand or salt on walking paths during winter. Clean up any spills in your garage right away. This includes oil or grease spills. What can I do in the bathroom? Use night lights. Install grab bars by the toilet and in the tub and shower. Do not use towel bars as grab bars. Use non-skid mats or decals in the tub or shower. If you need to sit down in the shower, use a plastic, non-slip stool. Keep the floor dry. Clean up any water that spills on the floor as soon as it happens. Remove soap buildup in the tub or shower regularly. Attach bath mats securely with double-sided non-slip rug tape. Do not have throw rugs and other things on the floor that can make you trip. What can I do in the bedroom? Use night lights. Make  sure that you have a light by your bed that is easy to reach. Do not use any sheets or blankets that are too big for your bed. They should not hang down onto the floor. Have a firm chair that has side arms. You can use this for support while you get dressed. Do not have throw rugs and other things on the floor that can make you trip. What can I do in the kitchen? Clean up any spills right away. Avoid walking on wet floors. Keep items that you use a lot in easy-to-reach places. If you need to reach something above you, use a strong step stool that has a grab bar. Keep electrical cords out of the way. Do not use floor polish or wax that makes floors slippery. If you must use wax, use non-skid floor wax. Do not have throw rugs and other things on the floor that can make you trip. What can I do with my stairs? Do not leave any items on the stairs. Make sure that there are handrails on both sides of the stairs and use them. Fix handrails that are broken or loose. Make sure that handrails are as long as the stairways. Check any carpeting to make sure that it is firmly attached to the stairs. Fix any carpet that is loose or worn. Avoid having throw rugs at the top or bottom of the stairs. If you do have throw rugs,  attach them to the floor with carpet tape. Make sure that you have a light switch at the top of the stairs and the bottom of the stairs. If you do not have them, ask someone to add them for you. What else can I do to help prevent falls? Wear shoes that: Do not have high heels. Have rubber bottoms. Are comfortable and fit you well. Are closed at the toe. Do not wear sandals. If you use a stepladder: Make sure that it is fully opened. Do not climb a closed stepladder. Make sure that both sides of the stepladder are locked into place. Ask someone to hold it for you, if possible. Clearly mark and make sure that you can see: Any grab bars or handrails. First and last steps. Where the  edge of each step is. Use tools that help you move around (mobility aids) if they are needed. These include: Canes. Walkers. Scooters. Crutches. Turn on the lights when you go into a dark area. Replace any light bulbs as soon as they burn out. Set up your furniture so you have a clear path. Avoid moving your furniture around. If any of your floors are uneven, fix them. If there are any pets around you, be aware of where they are. Review your medicines with your doctor. Some medicines can make you feel dizzy. This can increase your chance of falling. Ask your doctor what other things that you can do to help prevent falls. This information is not intended to replace advice given to you by your health care provider. Make sure you discuss any questions you have with your health care provider. Document Released: 05/30/2009 Document Revised: 01/09/2016 Document Reviewed: 09/07/2014 Elsevier Interactive Patient Education  2017 [employer].

## 2022-05-07 NOTE — Progress Notes (Signed)
I connected with [employer] today by telephone and verified that I am speaking with the correct person using two identifiers. Location patient: home Location provider: work Persons participating in the virtual visit: Health and safety inspector, [employer] LPN.   I discussed the limitations, risks, security and privacy concerns of performing an evaluation and management service by telephone and the availability of in person appointments. I also discussed with the patient that there may be a patient responsible charge related to this service. The patient expressed understanding and verbally consented to this telephonic visit.    Interactive audio and video telecommunications were attempted between this provider and patient, however failed, due to patient having technical difficulties OR patient did not have access to video capability.  We continued and completed visit with audio only.     Vital signs may be patient reported or missing.  Subjective:   Marcus Johnston is a 88 y.o. male who presents for Medicare Annual/Subsequent preventive examination.  Review of Systems     Cardiac Risk Factors include: advanced age (>99mn, >>96women);diabetes mellitus;dyslipidemia;hypertension;male gender     Objective:    Today's Vitals   05/07/22 1056  Weight: 210 lb (95.3 kg)  Height: '6\' 3"'$  (1.905 m)   Body mass index is 26.25 kg/m.     05/07/2022   10:59 AM 05/01/2021   11:49 AM 04/04/2020    9:05 AM 12/17/2019    9:17 PM 05/08/2019    5:58 AM 04/30/2019    6:15 PM 03/23/2019   10:55 AM  Advanced Directives  Does Patient Have a Medical Advance Directive? No No No No No No No  Would patient like information on creating a medical advance directive?   No - Patient declined No - Patient declined No - Patient declined No - Patient declined No - Patient declined    Current Medications (verified) Outpatient Encounter Medications as of 05/07/2022  Medication Sig   acetaminophen (TYLENOL) 325 MG tablet  Take 650 mg by mouth every 6 (six) hours as needed for mild pain or fever.   amLODipine (NORVASC) 5 MG tablet Take 2 tablets by mouth daily.   aspirin 81 MG chewable tablet    atorvastatin (LIPITOR) 20 MG tablet TAKE 1 TABLET (20 MG TOTAL) BY MOUTH EVERY MONDAY, WEDNESDAY AND FRIDAY   carvedilol (COREG) 12.5 MG tablet TAKE 1 TABLET BY MOUTH  TWICE DAILY WITH MEALS   Cholecalciferol (VITAMIN D3) 50 MCG (2000 UT) capsule Take 1 capsule by mouth daily.   dapagliflozin propanediol (FARXIGA) 5 MG TABS tablet 1 tablet   doxazosin (CARDURA) 2 MG tablet Take 2 mg by mouth daily.   fexofenadine (ALLEGRA) 180 MG tablet Take 180 mg by mouth daily as needed (allergies.).    hydrochlorothiazide (HYDRODIURIL) 25 MG tablet TAKE 1 TABLET BY MOUTH DAILY   insulin lispro protamine-lispro (HUMALOG 50/50 MIX) (50-50) 100 UNIT/ML SUSP injection Inject 10-15 Units into the skin See admin instructions. Inject 25 units subcutaneously with breakfast and 22 units with supper   Insulin Syringe-Needle U-100 (EASY TOUCH INSULIN SYRINGE) 30G X 5/16" 0.5 ML MISC USE 3 TIMES DAILY   losartan-hydrochlorothiazide (HYZAAR) 100-25 MG tablet TAKE 1 TABLET BY MOUTH  DAILY   metFORMIN (GLUCOPHAGE-XR) 500 MG 24 hr tablet Take 1,000 mg by mouth 2 (two) times daily with a meal.    No facility-administered encounter medications on file as of 05/07/2022.    Allergies (verified) Bidil [isosorb dinitrate-hydralazine], Dapagliflozin, Pneumococcal vac polyvalent, and Statins   History: Past Medical History:  Diagnosis Date   Atrial fibrillation (HCC)    Atypical atrial flutter ([place]) 10/17/2018   Diabetes mellitus without complication (HCC)    Diabetic retinopathy (HCC)    NPDR OU   High cholesterol    History of prostate cancer    HTN (hypertension)    Hypertensive retinopathy    OU   Renal insufficiency    Shingles    Past Surgical History:  Procedure Laterality Date   ATRIAL TACH ABLATION N/A 05/08/2019   Procedure: ATRIAL  TACH ABLATION;  Surgeon: [name], MD;  Location: [facility];  Service: Cardiovascular;  Laterality: N/A;   CATARACT EXTRACTION Bilateral    EYE SURGERY Bilateral    Cat Sx   prostate implant     Family History  Problem Relation Age of Onset   Cancer Mother    Cancer Father    Social History   Socioeconomic History   Marital status: Married    Spouse name: Not on file   Number of children: 5   Years of education: Not on file   Highest education level: Not on file  Occupational History   Occupation: retired  Tobacco Use   Smoking status: Never   Smokeless tobacco: Never  Vaping Use   Vaping Use: Never used  Substance and Sexual Activity   Alcohol use: No   Drug use: No   Sexual activity: Yes  Other Topics Concern   Not on file  Social History Narrative   Not on file   Social Determinants of Health   Financial Resource Strain: Low Risk  (05/07/2022)   Overall Financial Resource Strain (CARDIA)    Difficulty of Paying Living Expenses: Not hard at all  Food Insecurity: No Food Insecurity (05/07/2022)   Hunger Vital Sign    Worried About Running Out of Food in the Last Year: Never true    [place] in the Last Year: Never true  Transportation Needs: No Transportation Needs (05/07/2022)   PRAPARE - Hydrologist (Medical): No    Lack of Transportation (Non-Medical): No  Physical Activity: Inactive (05/07/2022)   Exercise Vital Sign    Days of Exercise per Week: 0 days    Minutes of Exercise per Session: 0 min  Stress: No Stress Concern Present (05/07/2022)   [place]    Feeling of Stress : Not at all  Social Connections: Not on file    Tobacco Counseling Counseling given: Not Answered   Clinical Intake:  Pre-visit preparation completed: Yes  Pain : No/denies pain     Nutritional Risks: None Diabetes: Yes  How often do you need to have  someone help you when you read instructions, pamphlets, or other written materials from your doctor or pharmacy?: 1 - Never  Diabetic? Yes Nutrition Risk Assessment:  Has the patient had any N/V/D within the last 2 months?  No  Does the patient have any non-healing wounds?  No  Has the patient had any unintentional weight loss or weight gain?  No   Diabetes:  Is the patient diabetic?  Yes  If diabetic, was a CBG obtained today?  No  Did the patient bring in their glucometer from home?  No  How often do you monitor your CBG's? daily.   Financial Strains and Diabetes Management:  Are you having any financial strains with the device, your supplies or your medication? No .  Does the patient  want to be seen by Chronic Care Management for management of their diabetes?  No  Would the patient like to be referred to a Nutritionist or for Diabetic Management?  No   Diabetic Exams:  Diabetic Eye Exam: Completed 09/11/2021 Diabetic Foot Exam: Completed 05/01/2022   Interpreter Needed?: No  Information entered by :: NAllen LPN   Activities of Daily Living    05/07/2022   11:00 AM  In your present state of health, do you have any difficulty performing the following activities:  Hearing? 0  Vision? 0  Difficulty concentrating or making decisions? 0  Walking or climbing stairs? 0  Dressing or bathing? 0  Doing errands, shopping? 0  Preparing Food and eating ? N  Using the Toilet? N  In the past six months, have you accidently leaked urine? N  Do you have problems with loss of bowel control? N  Managing your Medications? N  Managing your Finances? N  Housekeeping or managing your Housekeeping? N    Patient Care Team: Glendale Chard, MD as PCP - General (Internal Medicine) Buford Dresser, MD as PCP - Cardiology (Cardiology) Caudill, Kennieth Francois, [facility] (Inactive) (Pharmacist) [place], P.A. (Ophthalmology)  Indicate any recent Medical Services you may have  received from other than [facility] providers in the past year (date may be approximate).     Assessment:   This is a routine wellness examination for [place].  Hearing/Vision screen Vision Screening - Comments:: Regular eye exams, Dr. Katy Fitch  Dietary issues and exercise activities discussed: Current Exercise Habits: The patient does not participate in regular exercise at present   Goals Addressed             This Visit's Progress    Patient Stated       05/07/2022, no goals       Depression Screen    05/07/2022   11:00 AM 05/01/2022    2:19 PM 05/01/2021   11:50 AM 04/16/2021    9:06 AM 04/04/2020    9:06 AM 04/03/2020   10:05 AM 03/23/2019   10:55 AM  PHQ 2/9 Scores  PHQ - 2 Score 0 0 0 0 0 0 0  PHQ- 9 Score     0  0    Fall Risk    05/07/2022   10:59 AM 05/01/2022    2:19 PM 05/01/2021   11:50 AM 04/16/2021    9:06 AM 04/04/2020    9:06 AM  Fall Risk   Falls in the past year? 0 0 0 0 0  Number falls in past yr: 0 0  0   Injury with Fall? 0 0  0   Risk for fall due to : Medication side effect No Fall Risks Medication side effect No Fall Risks Medication side effect  Follow up Falls prevention discussed;Education provided;Falls evaluation completed Falls evaluation completed Falls evaluation completed;Education provided;Falls prevention discussed  Falls evaluation completed;Education provided;Falls prevention discussed    FALL RISK PREVENTION PERTAINING TO THE HOME:  Any stairs in or around the home? Yes  If so, are there any without handrails? No  Home free of loose throw rugs in walkways, pet beds, electrical cords, etc? Yes  Adequate lighting in your home to reduce risk of falls? Yes   ASSISTIVE DEVICES UTILIZED TO PREVENT FALLS:  Life alert? No  Use of a cane, walker or w/c? No  Grab bars in the bathroom? Yes  Shower chair or bench in shower? No  Elevated toilet seat or a handicapped  toilet? Yes   TIMED UP AND GO:  Was the test performed? No .       Cognitive Function:        05/07/2022   11:00 AM 05/01/2021   11:51 AM 04/04/2020    9:07 AM 03/23/2019   10:57 AM  6CIT Screen  What Year? 0 points 0 points 0 points 0 points  What month? 0 points 0 points 0 points 0 points  What time? 0 points 0 points 0 points 0 points  Count back from 20 0 points 0 points 0 points 0 points  Months in reverse 0 points 0 points 0 points 2 points  Repeat phrase 2 points 2 points 4 points 0 points  Total Score 2 points 2 points 4 points 2 points    Immunizations Immunization History  Administered Date(s) Administered   Fluad Quad(high Dose 65+) 05/02/2019, 06/13/2020, 05/01/2021, 05/01/2022   Influenza-Unspecified 05/17/2001, 08/16/2003   PFIZER(Purple Top)SARS-COV-2 Vaccination 09/22/2019, 10/13/2019, 07/08/2020   Pneumococcal-Unspecified 02/08/2002    TDAP status: Due, Education has been provided regarding the importance of this vaccine. Advised may receive this vaccine at local pharmacy or Health Dept. Aware to provide a copy of the vaccination record if obtained from local pharmacy or Health Dept. Verbalized acceptance and understanding.  Flu Vaccine status: Up to date  Pneumococcal vaccine status: Due, Education has been provided regarding the importance of this vaccine. Advised may receive this vaccine at local pharmacy or Health Dept. Aware to provide a copy of the vaccination record if obtained from local pharmacy or Health Dept. Verbalized acceptance and understanding.  Covid-19 vaccine status: Completed vaccines  Qualifies for Shingles Vaccine? Yes   Zostavax completed No   Shingrix Completed?: No.    Education has been provided regarding the importance of this vaccine. Patient has been advised to call insurance company to determine out of pocket expense if they have not yet received this vaccine. Advised may also receive vaccine at local pharmacy or Health Dept. Verbalized acceptance and understanding.  Screening Tests Health  Maintenance  Topic Date Due   COVID-19 Vaccine (4 - Pfizer risk series) 09/02/2020   Zoster Vaccines- Shingrix (1 of 2) 07/31/2022 (Originally 04/22/1950)   Pneumonia Vaccine 50+ Years old (1 - PCV) 10/16/2022 (Originally 04/22/1998)   TETANUS/TDAP  10/16/2022 (Originally 07/20/2021)   OPHTHALMOLOGY EXAM  09/11/2022   HEMOGLOBIN A1C  10/30/2022   FOOT EXAM  05/02/2023   INFLUENZA VACCINE  Completed   HPV VACCINES  Aged Out    Health Maintenance  Health Maintenance Due  Topic Date Due   COVID-19 Vaccine (4 - Pfizer risk series) 09/02/2020    Colorectal cancer screening: No longer required.   Lung Cancer Screening: (Low Dose CT Chest recommended if Age 65-80 years, 30 pack-year currently smoking OR have quit w/in 15years.) does not qualify.   Lung Cancer Screening Referral: no  Additional Screening:  Hepatitis C Screening: does not qualify;   Vision Screening: Recommended annual ophthalmology exams for early detection of glaucoma and other disorders of the eye. Is the patient up to date with their annual eye exam?  Yes  Who is the provider or what is the name of the office in which the patient attends annual eye exams? Dr. Katy Fitch  If pt is not established with a provider, would they like to be referred to a provider to establish care? No .   Dental Screening: Recommended annual dental exams for proper oral hygiene  Community Resource Referral / Chronic Care Management:  CRR required this visit?  No   CCM required this visit?  No      Plan:     I have personally reviewed and noted the following in the patient's chart:   Medical and social history Use of alcohol, tobacco or illicit drugs  Current medications and supplements including opioid prescriptions. Patient is not currently taking opioid prescriptions. Functional ability and status Nutritional status Physical activity Advanced directives List of other physicians Hospitalizations, surgeries, and ER visits in  previous 12 months Vitals Screenings to include cognitive, depression, and falls Referrals and appointments  In addition, I have reviewed and discussed with patient certain preventive protocols, quality metrics, and best practice recommendations. A written personalized care plan for preventive services as well as general preventive health recommendations were provided to patient.     Kellie Simmering, LPN   [id number]   Nurse Notes: none  Due to this being a virtual visit, the after visit summary with patients personalized plan was offered to patient via mail or my-chart. Patient would like to access on my-chart

## 2022-05-17 ENCOUNTER — Other Ambulatory Visit: Payer: Self-pay | Admitting: Internal Medicine

## 2022-05-17 DIAGNOSIS — I10 Essential (primary) hypertension: Secondary | ICD-10-CM

## 2022-05-19 ENCOUNTER — Ambulatory Visit
Admission: RE | Admit: 2022-05-19 | Discharge: 2022-05-19 | Disposition: A | Discharge status: Home / Self Care | Payer: Medicare Other | Source: Ambulatory Visit | Attending: Internal Medicine | Admitting: Internal Medicine

## 2022-05-19 DIAGNOSIS — N1831 Chronic kidney disease, stage 3a: Secondary | ICD-10-CM

## 2022-05-21 ENCOUNTER — Ambulatory Visit: Payer: Medicare Other

## 2022-05-21 VITALS — BP 128/82 | HR 78 | Temp 98.1°F | Ht 75.0 in | Wt 210.0 lb

## 2022-05-21 DIAGNOSIS — I1310 Hypertensive heart and chronic kidney disease without heart failure, with stage 1 through stage 4 chronic kidney disease, or unspecified chronic kidney disease: Secondary | ICD-10-CM

## 2022-05-21 NOTE — Progress Notes (Signed)
Patient presents today to go over medication list. Per provider.

## 2022-05-28 ENCOUNTER — Other Ambulatory Visit: Payer: Self-pay | Admitting: Internal Medicine

## 2022-06-03 ENCOUNTER — Other Ambulatory Visit: Payer: Self-pay | Admitting: Internal Medicine

## 2022-06-17 ENCOUNTER — Encounter: Payer: Self-pay | Admitting: Podiatry

## 2022-06-17 ENCOUNTER — Ambulatory Visit: Payer: Medicare Other | Admitting: Podiatry

## 2022-06-17 DIAGNOSIS — Z794 Long term (current) use of insulin: Secondary | ICD-10-CM | POA: Diagnosis not present

## 2022-06-17 DIAGNOSIS — E119 Type 2 diabetes mellitus without complications: Secondary | ICD-10-CM | POA: Diagnosis not present

## 2022-06-17 DIAGNOSIS — B351 Tinea unguium: Secondary | ICD-10-CM

## 2022-06-17 DIAGNOSIS — M79676 Pain in unspecified toe(s): Secondary | ICD-10-CM

## 2022-06-17 NOTE — Progress Notes (Signed)
  Subjective:  Patient ID: Marcus Johnston, male    DOB: [birth year],  MRN: [id number]  Marcus Johnston presents to clinic today for preventative diabetic foot care and painful thick toenails that are difficult to trim. Pain interferes with ambulation. Aggravating factors include wearing enclosed shoe gear. Pain is relieved with periodic professional debridement.  Chief Complaint  Patient presents with   Nail Problem    Diabetic foot care BS-136 A1C-8.2 PCP- Marcus Johnston PCP VST- Couple months ago    New problem(s): None.   PCP is Marcus Chard, MD , and last visit was May 01, 2022.  Allergies  Allergen Reactions   Bidil [Isosorb Dinitrate-Hydralazine] Other (See Comments)    Severe hypotension episode after administering one dose of Bidil 20-37.5 mg on 09/25/2019.    Dapagliflozin Other (See Comments)   Pneumococcal Vac Polyvalent Other (See Comments)   Statins Other (See Comments)    Leg pain    Review of Systems: Negative except as noted in the HPI.  Objective: No changes noted in today's physical examination.  Marcus Johnston is a pleasant 88 y.o. male WD, WN in NAD. AAO x 3.  Vascular:  Capillary refill time to digits immediate b/l. Palpable pedal pulses b/l LE. Pedal hair sparse. No pain with calf compression b/l. Lower extremity skin temperature gradient within normal limits. No edema noted b/l LE. No cyanosis or clubbing noted b/l LE.   Dermatological:  Copper colored macule 0.1 x 0.5 cm on distal tip of left 5th digit. Flat, no elevation, no palpable depth. Unchanged from prior visit.  Pedal integument with normal turgor, texture and tone b/l LE. No open wounds b/l. No interdigital macerations b/l. Toenails 1-5 b/l elongated, thickened, discolored with subungual debris. +Tenderness with dorsal palpation of nailplates. No hyperkeratotic or porokeratotic lesions present.   Musculoskeletal:  Muscle strength 5/5 to all lower extremity muscle groups bilaterally. No  pain, crepitus or joint limitation noted with ROM bilateral LE. No gross bony deformities bilaterally. Patient ambulates independent of any assistive aids.   Neurological:  Protective sensation intact 5/5 intact bilaterally with 10g monofilament b/l. Vibratory sensation intact b/l. Proprioception intact bilaterally. Clonus negative b/l.   Assessment/Plan: 1. Pain due to onychomycosis of toenail   2. Type 2 diabetes mellitus without complication, with long-term current use of insulin (HCC)     No orders of the defined types were placed in this encounter.   -Consent given for treatment as described below: -Examined patient. -Continue diabetic foot care principles: inspect feet daily, monitor glucose as recommended by PCP and/or Endocrinologist, and follow prescribed diet per PCP, Endocrinologist and/or dietician. -Toenails 1-5 bilaterally were debrided in length and girth with sterile nail nippers and dremel. Pinpoint bleeding of left great toe addressed with Lumicain Hemostatic Solution, cleansed with alcohol. triple antibiotic ointment applied. No further treatment required by patient/caregiver. -Patient/POA to call should there be question/concern in the interim.   Return in about 3 months (around 09/17/2022).  Marcus Johnston, DPM

## 2022-07-25 ENCOUNTER — Other Ambulatory Visit: Payer: Self-pay | Admitting: Internal Medicine

## 2022-08-28 LAB — HEMOGLOBIN A1C
Hemoglobin A1C: 7.6
Hemoglobin A1C: 7.6

## 2022-09-30 ENCOUNTER — Other Ambulatory Visit: Payer: Self-pay | Admitting: Internal Medicine

## 2022-10-12 ENCOUNTER — Encounter: Payer: Self-pay | Admitting: Podiatry

## 2022-10-12 ENCOUNTER — Ambulatory Visit: Payer: Medicare Other | Admitting: Podiatry

## 2022-10-12 VITALS — BP 187/94

## 2022-10-12 DIAGNOSIS — B351 Tinea unguium: Secondary | ICD-10-CM

## 2022-10-12 DIAGNOSIS — E119 Type 2 diabetes mellitus without complications: Secondary | ICD-10-CM | POA: Diagnosis not present

## 2022-10-12 DIAGNOSIS — M79676 Pain in unspecified toe(s): Secondary | ICD-10-CM

## 2022-10-12 DIAGNOSIS — Z794 Long term (current) use of insulin: Secondary | ICD-10-CM

## 2022-10-12 NOTE — Progress Notes (Signed)
  Subjective:  Patient ID: Marcus Johnston, male    DOB: 09-Aug-1931,  MRN: [id number]  Marcus Johnston presents to clinic today for preventative diabetic foot care and painful elongated mycotic toenails 1-5 bilaterally which are tender when wearing enclosed shoe gear. Pain is relieved with periodic professional debridement.  Chief Complaint  Patient presents with   Nail Problem    DFC BS-134 A1C-7.6 PCP-Sanders, Robyn PCP VST- 3 months ago   New problem(s): None.   PCP is [place] Chard, MD.  Allergies  Allergen Reactions   Bidil [Isosorb Dinitrate-Hydralazine] Other (See Comments)    Severe hypotension episode after administering one dose of Bidil 20-37.5 mg on 09/25/2019.    Dapagliflozin Other (See Comments)   Pneumococcal Vac Polyvalent Other (See Comments)   Statins Other (See Comments)    Leg pain    Review of Systems: Negative except as noted in the HPI.  Objective: No changes noted in today's physical examination. Vitals:   10/12/22 0840 10/12/22 0903  BP: (!) 195/99 (!) 187/94   Marcus Johnston is a pleasant 89 y.o. male obese in NAD. AAO x 3. Blood pressure elevated as patient did not take his blood pressure medication this morning. He is asymptomatic.  Vascular:  Capillary refill time to digits immediate b/l. Palpable pedal pulses b/l LE. Pedal hair sparse. No pain with calf compression b/l. Lower extremity skin temperature gradient within normal limits. No edema noted b/l LE. No cyanosis or clubbing noted b/l LE.   Dermatological:  Copper colored macule 0.1 x 0.5 cm on distal tip of left 5th digit. Flat, no elevation, no palpable depth. Unchanged from prior visit.  Pedal integument with normal turgor, texture and tone b/l LE. No open wounds b/l. No interdigital macerations b/l. Toenails 1-5 b/l elongated, thickened, discolored with subungual debris. +Tenderness with dorsal palpation of nailplates. No hyperkeratotic or porokeratotic lesions present.    Musculoskeletal:  Muscle strength 5/5 to all lower extremity muscle groups bilaterally. No pain, crepitus or joint limitation noted with ROM bilateral LE. No gross bony deformities bilaterally. Patient ambulates independent of any assistive aids.   Neurological:  Protective sensation intact 5/5 intact bilaterally with 10g monofilament b/l. Vibratory sensation intact b/l. Proprioception intact bilaterally. Clonus negative b/l.   Assessment/Plan: 1. Pain due to onychomycosis of toenail   2. Type 2 diabetes mellitus without complication, with long-term current use of insulin ([place])     -Patient was evaluated and treated. All patient's and/or POA's questions/concerns answered on today's visit. -Discussed elevated blood pressure reading with patient. Patient advised to take blood pressure medication when they get home. Patient to repeat blood pressure at home and contact PCP/Cardiologist if it remains elevated. Patient/Family/Caregiver/POA related understanding. -Continue foot and shoe inspections daily. Monitor blood glucose per PCP/Endocrinologist's recommendations. -Patient to continue soft, supportive shoe gear daily. -Mycotic toenails 1-5 bilaterally were debrided in length and girth with sterile nail nippers and dremel without incident. -Patient/POA to call should there be question/concern in the interim.   Return in about 9 weeks (around 12/14/2022).  Marzetta Board, DPM

## 2022-11-02 ENCOUNTER — Encounter: Payer: Self-pay | Admitting: Internal Medicine

## 2022-11-02 ENCOUNTER — Ambulatory Visit: Payer: Medicare Other | Admitting: Internal Medicine

## 2022-11-02 VITALS — BP 130/70 | HR 71 | Temp 97.8°F | Ht 75.0 in | Wt 215.2 lb

## 2022-11-02 DIAGNOSIS — E113313 Type 2 diabetes mellitus with moderate nonproliferative diabetic retinopathy with macular edema, bilateral: Secondary | ICD-10-CM

## 2022-11-02 DIAGNOSIS — Z6826 Body mass index (BMI) 26.0-26.9, adult: Secondary | ICD-10-CM

## 2022-11-02 DIAGNOSIS — E7800 Pure hypercholesterolemia, unspecified: Secondary | ICD-10-CM

## 2022-11-02 DIAGNOSIS — N1831 Chronic kidney disease, stage 3a: Secondary | ICD-10-CM | POA: Diagnosis not present

## 2022-11-02 DIAGNOSIS — I1310 Hypertensive heart and chronic kidney disease without heart failure, with stage 1 through stage 4 chronic kidney disease, or unspecified chronic kidney disease: Secondary | ICD-10-CM

## 2022-11-02 DIAGNOSIS — Z794 Long term (current) use of insulin: Secondary | ICD-10-CM

## 2022-11-02 DIAGNOSIS — Z2821 Immunization not carried out because of patient refusal: Secondary | ICD-10-CM

## 2022-11-02 DIAGNOSIS — R432 Parageusia: Secondary | ICD-10-CM

## 2022-11-02 DIAGNOSIS — E1122 Type 2 diabetes mellitus with diabetic chronic kidney disease: Secondary | ICD-10-CM | POA: Diagnosis not present

## 2022-11-02 LAB — HM DIABETES EYE EXAM

## 2022-11-02 NOTE — Patient Instructions (Signed)
Hypertension, Adult ?Hypertension is another name for high blood pressure. High blood pressure forces your heart to work harder to pump blood. This can cause problems over time. ?There are two numbers in a blood pressure reading. There is a top number (systolic) over a bottom number (diastolic). It is best to have a blood pressure that is below 120/80. ?What are the causes? ?The cause of this condition is not known. Some other conditions can lead to high blood pressure. ?What increases the risk? ?Some lifestyle factors can make you more likely to develop high blood pressure: ?Smoking. ?Not getting enough exercise or physical activity. ?Being overweight. ?Having too much fat, sugar, calories, or salt (sodium) in your diet. ?Drinking too much alcohol. ?Other risk factors include: ?Having any of these conditions: ?Heart disease. ?Diabetes. ?High cholesterol. ?Kidney disease. ?Obstructive sleep apnea. ?Having a family history of high blood pressure and high cholesterol. ?Age. The risk increases with age. ?Stress. ?What are the signs or symptoms? ?High blood pressure may not cause symptoms. Very high blood pressure (hypertensive crisis) may cause: ?Headache. ?Fast or uneven heartbeats (palpitations). ?Shortness of breath. ?Nosebleed. ?Vomiting or feeling like you may vomit (nauseous). ?Changes in how you see. ?Very bad chest pain. ?Feeling dizzy. ?Seizures. ?How is this treated? ?This condition is treated by making healthy lifestyle changes, such as: ?Eating healthy foods. ?Exercising more. ?Drinking less alcohol. ?Your doctor may prescribe medicine if lifestyle changes do not help enough and if: ?Your top number is above 130. ?Your bottom number is above 80. ?Your personal target blood pressure may vary. ?Follow these instructions at home: ?Eating and drinking ? ?If told, follow the DASH eating plan. To follow this plan: ?Fill one half of your plate at each meal with fruits and vegetables. ?Fill one fourth of your plate  at each meal with whole grains. Whole grains include whole-wheat pasta, brown rice, and whole-grain bread. ?Eat or drink low-fat dairy products, such as skim milk or low-fat yogurt. ?Fill one fourth of your plate at each meal with low-fat (lean) proteins. Low-fat proteins include fish, chicken without skin, eggs, beans, and tofu. ?Avoid fatty meat, cured and processed meat, or chicken with skin. ?Avoid pre-made or processed food. ?Limit the amount of salt in your diet to less than 1,500 mg each day. ?Do not drink alcohol if: ?Your doctor tells you not to drink. ?You are pregnant, may be pregnant, or are planning to become pregnant. ?If you drink alcohol: ?Limit how much you have to: ?0-1 drink a day for women. ?0-2 drinks a day for men. ?Know how much alcohol is in your drink. In the [place] one drink equals one 12 oz bottle of beer (355 mL), one 5 oz glass of wine (148 mL), or one 1? oz glass of hard liquor (44 mL). ?Lifestyle ? ?Work with your doctor to stay at a healthy weight or to lose weight. Ask your doctor what the best weight is for you. ?Get at least 30 minutes of exercise that causes your heart to beat faster (aerobic exercise) most days of the week. This may include walking, swimming, or biking. ?Get at least 30 minutes of exercise that strengthens your muscles (resistance exercise) at least 3 days a week. This may include lifting weights or doing Pilates. ?Do not smoke or use any products that contain nicotine or tobacco. If you need help quitting, ask your doctor. ?Check your blood pressure at home as told by your doctor. ?Keep all follow-up visits. ?Medicines ?Take over-the-counter and prescription medicines   only as told by your doctor. Follow directions carefully. ?Do not skip doses of blood pressure medicine. The medicine does not work as well if you skip doses. Skipping doses also puts you at risk for problems. ?Ask your doctor about side effects or reactions to medicines that you should watch  for. ?Contact a doctor if: ?You think you are having a reaction to the medicine you are taking. ?You have headaches that keep coming back. ?You feel dizzy. ?You have swelling in your ankles. ?You have trouble with your vision. ?Get help right away if: ?You get a very bad headache. ?You start to feel mixed up (confused). ?You feel weak or numb. ?You feel faint. ?You have very bad pain in your: ?Chest. ?Belly (abdomen). ?You vomit more than once. ?You have trouble breathing. ?These symptoms may be an emergency. Get help right away. Call 911. ?Do not wait to see if the symptoms will go away. ?Do not drive yourself to the hospital. ?Summary ?Hypertension is another name for high blood pressure. ?High blood pressure forces your heart to work harder to pump blood. ?For most people, a normal blood pressure is less than 120/80. ?Making healthy choices can help lower blood pressure. If your blood pressure does not get lower with healthy choices, you may need to take medicine. ?This information is not intended to replace advice given to you by your health care provider. Make sure you discuss any questions you have with your health care provider. ?Document Revised: 05/22/2021 Document Reviewed: 05/22/2021 ?Elsevier Patient Education ? 2023 Elsevier Inc. ? ?

## 2022-11-02 NOTE — Progress Notes (Signed)
Marcus Johnston,acting as a Education administrator for Marcus Greenland, MD.,have documented all relevant documentation on the behalf of Marcus Greenland, MD,as directed by  Marcus Greenland, MD while in the presence of Marcus Greenland, MD.    Subjective:     Patient ID: Marcus Johnston , male    DOB: 10/16/1930 , 89 y.o.   MRN: [id number]   Chief Complaint  Patient presents with   Hypertension   Diabetes   Hyperlipidemia    HPI  Patient presents today for blood pressure, diabetes & cholesterol follow up. He reports compliance with medications. Denies headache, chest pain, SOB. He was last seen by Endo Jan 2024. Last A1c 7.6.   BP Readings from Last 3 Encounters: 11/02/22 : (!) 144/72 10/12/22 : (!) 187/94 05/21/22 : 128/82    Hypertension This is a chronic problem. The current episode started more than 1 year ago. The problem has been gradually improving since onset. The problem is uncontrolled. Pertinent negatives include no blurred vision, chest pain, palpitations or shortness of breath. Risk factors for coronary artery disease include diabetes mellitus, dyslipidemia, male gender and sedentary lifestyle. Past treatments include beta blockers. There are no compliance problems.  Identifiable causes of hypertension include chronic renal disease.  Diabetes He presents for his follow-up diabetic visit. He has type 2 diabetes mellitus. There are no hypoglycemic associated symptoms. Pertinent negatives for diabetes include no blurred vision and no chest pain. There are no hypoglycemic complications. Risk factors for coronary artery disease include diabetes mellitus, dyslipidemia, hypertension and male sex. He is compliant with treatment some of the time. He is following a diabetic diet. He participates in exercise intermittently. An ACE inhibitor/angiotensin II receptor blocker is being taken.  Hyperlipidemia This is a chronic problem. The current episode started more than 1 year ago. Exacerbating diseases  include chronic renal disease and diabetes. Pertinent negatives include no chest pain, leg pain, myalgias or shortness of breath. Current antihyperlipidemic treatment includes statins. Risk factors for coronary artery disease include hypertension, diabetes mellitus and dyslipidemia.     Past Medical History:  Diagnosis Date   Atrial fibrillation ([place])    Atypical atrial flutter ([place]) 10/17/2018   Diabetes mellitus without complication (HCC)    Diabetic retinopathy (HCC)    NPDR OU   High cholesterol    History of prostate cancer    HTN (hypertension)    Hypertensive retinopathy    OU   Renal insufficiency    Shingles      Family History  Problem Relation Age of Onset   Cancer Mother    Cancer Father      Current Outpatient Medications:    acetaminophen (TYLENOL) 500 MG tablet, Take 500 mg by mouth every 6 (six) hours as needed., Disp: , Rfl:    amLODipine (NORVASC) 5 MG tablet, TAKE 1 TABLET BY MOUTH TWICE  DAILY, Disp: 180 tablet, Rfl: 3   aspirin 81 MG chewable tablet, , Disp: , Rfl:    atorvastatin (LIPITOR) 20 MG tablet, TAKE 1 TABLET (20 MG TOTAL) BY MOUTH EVERY MONDAY, WEDNESDAY AND FRIDAY, Disp: 15 tablet, Rfl: 1   carvedilol (COREG) 12.5 MG tablet, TAKE 1 TABLET BY MOUTH TWICE  DAILY WITH MEALS, Disp: 180 tablet, Rfl: 3   Cholecalciferol (VITAMIN D3) 50 MCG (2000 UT) capsule, Take 1 capsule by mouth daily., Disp: , Rfl:    doxazosin (CARDURA) 2 MG tablet, Take 2 mg by mouth daily., Disp: , Rfl:    Dulaglutide (TRULICITY) [id number]  MG/0.5ML SOPN, Inject 0.75 mg into the skin once a week., Disp: , Rfl:    fexofenadine (ALLEGRA) 180 MG tablet, Take 180 mg by mouth daily as needed (allergies.). , Disp: , Rfl:    hydrochlorothiazide (HYDRODIURIL) 25 MG tablet, TAKE 1 TABLET BY MOUTH DAILY, Disp: 90 tablet, Rfl: 3   insulin lispro protamine-lispro (HUMALOG 50/50 MIX) (50-50) 100 UNIT/ML SUSP injection, Inject 10-15 Units into the skin See admin instructions. Inject 25 units  subcutaneously with breakfast and 22 units with supper, Disp: , Rfl:    Insulin Syringe-Needle U-100 (EASY TOUCH INSULIN SYRINGE) 30G X 5/16" 0.5 ML MISC, USE 3 TIMES DAILY, Disp: 270 each, Rfl: 3   losartan (COZAAR) 100 MG tablet, TAKE 1 TABLET BY MOUTH DAILY, Disp: 90 tablet, Rfl: 3   metFORMIN (GLUCOPHAGE-XR) 500 MG 24 hr tablet, Take 1,000 mg by mouth 2 (two) times daily with a meal. , Disp: , Rfl:    acetaminophen (TYLENOL) 325 MG tablet, Take 650 mg by mouth every 6 (six) hours as needed for mild pain or fever. (Patient not taking: Reported on 05/21/2022), Disp: , Rfl:    dapagliflozin propanediol (FARXIGA) 5 MG TABS tablet, 1 tablet (Patient not taking: Reported on 05/21/2022), Disp: , Rfl:    Allergies  Allergen Reactions   Bidil [Isosorb Dinitrate-Hydralazine] Other (See Comments)    Severe hypotension episode after administering one dose of Bidil 20-37.5 mg on 09/25/2019.    Dapagliflozin Other (See Comments)   Pneumococcal Vac Polyvalent Other (See Comments)   Statins Other (See Comments)    Leg pain     Review of Systems  Constitutional: Negative.   HENT:         He c/o change in his taste. He is not sure what triggered his sx. He has noticed over past several months. No recent URI.   Eyes:  Negative for blurred vision.  Respiratory:  Negative for shortness of breath.   Cardiovascular: Negative.  Negative for chest pain and palpitations.  Gastrointestinal: Negative.   Endocrine: Negative.   Genitourinary: Negative.   Musculoskeletal:  Negative for myalgias.  Skin: Negative.   Allergic/Immunologic: Negative.   Hematological: Negative.   Psychiatric/Behavioral: Negative.       Today's Vitals   11/02/22 0909 11/02/22 0925  BP: (!) 144/72 130/70  Pulse: 71   Temp: 97.8 F (36.6 C)   TempSrc: Oral   Weight: 215 lb 3.2 oz (97.6 kg)   Height: 6\' 3"  (1.905 m)   PainSc: 0-No pain    Body mass index is 26.9 kg/m.  Wt Readings from Last 3 Encounters:  11/02/22 215 lb 3.2  oz (97.6 kg)  05/21/22 210 lb (95.3 kg)  05/07/22 210 lb (95.3 kg)    Objective:  Physical Exam      Assessment And Plan:     1. Malignant HTN with heart disease, w/o CHF, with chronic kidney disease Comments: Chronic, controlled today. No med changes. He is encouraged to continue keeping home BP log. - Lipid panel - CMP14+EGFR  2. Type 2 diabetes mellitus with stage 3a chronic kidney disease, with long-term current use of insulin (HCC) Comments: Recent Endo notes reviewed, input appreciated Reminded to avoid NSAIDs to decrease risk of CKD progression. Admits he stopped [place] when rx ran out. - Lipid panel - CMP14+EGFR  3. Pure hypercholesterolemia Comments: Chronic, LDL goal < 70. He is currently taking atorvastatin 20mg  MWF, has not tolerated daily dosing in the past. - Lipid panel - CMP14+EGFR  4. Loss  of taste Comments: Denies recent URI. If persistent, will refer to ENT for further evaluation .Advised pt to also discuss at next dental appt.  5. Moderate nonproliferative diabetic retinopathy of both eyes with macular edema associated with type 2 diabetes mellitus ([place]) Comments: Chronic, I will request most recent eye exam.  6. BMI 26.0-26.9,adult Comments: He is encouraged to aim for at least 150 minutes of exercise/week. HIs BMI is acceptable for his demographic.  7. Herpes zoster vaccination declined  8. Pneumococcal vaccination declined  9. Tetanus, diphtheria, and acellular pertussis (Tdap) vaccination declined   Patient was given opportunity to ask questions. Patient verbalized understanding of the plan and was able to repeat key elements of the plan. All questions were answered to their satisfaction.   I, Marcus Greenland, MD, have reviewed all documentation for this visit. The documentation on 11/02/22 for the exam, diagnosis, procedures, and orders are all accurate and complete.   IF YOU HAVE BEEN REFERRED TO A SPECIALIST, IT MAY TAKE 1-2 WEEKS TO  SCHEDULE/PROCESS THE REFERRAL. IF YOU HAVE NOT HEARD FROM US/SPECIALIST IN TWO WEEKS, PLEASE GIVE [place] A CALL AT [phone].   THE PATIENT IS ENCOURAGED TO PRACTICE SOCIAL DISTANCING DUE TO THE COVID-19 PANDEMIC.

## 2022-11-03 LAB — CMP14+EGFR
ALT: 9 IU/L (ref 0–44)
AST: 13 IU/L (ref 0–40)
Albumin/Globulin Ratio: 1.8 (ref 1.2–2.2)
Albumin: 4.4 g/dL (ref 3.7–4.7)
Alkaline Phosphatase: 78 IU/L (ref 44–121)
BUN/Creatinine Ratio: 15 (ref 10–24)
BUN: 19 mg/dL (ref 8–27)
Bilirubin Total: 0.6 mg/dL (ref 0.0–1.2)
CO2: 24 mmol/L (ref 20–29)
Calcium: 10 mg/dL (ref 8.6–10.2)
Chloride: 104 mmol/L (ref 96–106)
Creatinine, Ser: 1.25 mg/dL (ref 0.76–1.27)
Globulin, Total: 2.5 g/dL (ref 1.5–4.5)
Glucose: 85 mg/dL (ref 70–99)
Potassium: 4.2 mmol/L (ref 3.5–5.2)
Sodium: 142 mmol/L (ref 134–144)
Total Protein: 6.9 g/dL (ref 6.0–8.5)
eGFR: 55 mL/min/{1.73_m2} — ABNORMAL LOW (ref >59)

## 2022-11-03 LAB — LIPID PANEL
Chol/HDL Ratio: 1.7 ratio (ref 0.0–5.0)
Cholesterol, Total: 137 mg/dL (ref 100–199)
HDL: 82 mg/dL (ref >39)
LDL Chol Calc (NIH): 45 mg/dL (ref 0–99)
Triglycerides: 37 mg/dL (ref 0–149)
VLDL Cholesterol Cal: 10 mg/dL (ref 5–40)

## 2022-11-16 ENCOUNTER — Ambulatory Visit: Payer: Medicare Other | Admitting: Podiatry

## 2022-12-07 ENCOUNTER — Other Ambulatory Visit: Payer: Self-pay | Admitting: Internal Medicine

## 2022-12-16 ENCOUNTER — Encounter: Payer: Self-pay | Admitting: Podiatry

## 2022-12-16 ENCOUNTER — Ambulatory Visit (INDEPENDENT_AMBULATORY_CARE_PROVIDER_SITE_OTHER): Payer: Medicare Other | Admitting: Podiatry

## 2022-12-16 ENCOUNTER — Ambulatory Visit: Payer: Medicare Other | Admitting: Podiatry

## 2022-12-16 VITALS — BP 133/76

## 2022-12-16 DIAGNOSIS — Z794 Long term (current) use of insulin: Secondary | ICD-10-CM

## 2022-12-16 DIAGNOSIS — E119 Type 2 diabetes mellitus without complications: Secondary | ICD-10-CM

## 2022-12-16 DIAGNOSIS — B353 Tinea pedis: Secondary | ICD-10-CM | POA: Diagnosis not present

## 2022-12-16 DIAGNOSIS — M79676 Pain in unspecified toe(s): Secondary | ICD-10-CM | POA: Diagnosis not present

## 2022-12-16 DIAGNOSIS — B351 Tinea unguium: Secondary | ICD-10-CM

## 2022-12-16 MED ORDER — MICONAZOLE NITRATE 2 % EX AERP: INHALATION_SPRAY | CUTANEOUS | 2 refills | Status: DC

## 2022-12-16 NOTE — Progress Notes (Signed)
  Subjective:  Patient ID: Marcus Johnston, male    DOB: Aug 09, 1931,  MRN: [id number]  Marcus Johnston presents to clinic today for preventative diabetic foot care  Chief Complaint  Patient presents with   Nail Problem    DFC BS-131 A1C-7.6 PCP-Sanders PCP VST-1 month ago    New problem(s): None.   PCP is Dorothyann Peng, MD.  Allergies  Allergen Reactions   Bidil [Isosorb Dinitrate-Hydralazine] Other (See Comments)    Severe hypotension episode after administering one dose of Bidil 20-37.5 mg on 09/25/2019.    Dapagliflozin Other (See Comments)   Pneumococcal Vac Polyvalent Other (See Comments)   Statins Other (See Comments)    Leg pain    Review of Systems: Negative except as noted in the HPI.  Objective: No changes noted in today's physical examination. Vitals:   12/16/22 1033  BP: 133/76   [name] is a pleasant 89 y.o. male in NAD. AAO x 3.  Vascular:  Capillary refill time to digits immediate b/l. Palpable pedal pulses b/l LE. Pedal hair sparse. No pain with calf compression b/l. Lower extremity skin temperature gradient within normal limits. No edema noted b/l LE. No cyanosis or clubbing noted b/l LE.   Dermatological:  Mild interdigital maceration noted webspaces 1-4 bilaterally. No breaks in skin.  Copper colored macule 0.1 x 0.5 cm on distal tip of left 5th digit. Flat, no elevation, no palpable depth. Unchanged from prior visit.    Pedal integument with normal turgor, texture and tone b/l LE. No open wounds b/l.   Toenails 1-5 b/l elongated, thickened, discolored with subungual debris. +Tenderness with dorsal palpation of nailplates.   No hyperkeratotic or porokeratotic lesions present.   Musculoskeletal:  Muscle strength 5/5 to all lower extremity muscle groups bilaterally. No pain, crepitus or joint limitation noted with ROM bilateral LE. No gross bony deformities bilaterally. Patient ambulates independent of any assistive aids.   Neurological:   Protective sensation intact 5/5 intact bilaterally with 10g monofilament b/l. Vibratory sensation intact b/l. Proprioception intact bilaterally. Clonus negative b/l.   Assessment/Plan: 1. Pain due to onychomycosis of toenail   2. Tinea pedis of both feet   3. Type 2 diabetes mellitus without complication, with long-term current use of insulin (HCC)     Meds ordered this encounter  Medications   Miconazole Nitrate 2 % AERP    Sig: Spray between toes once daily.    Dispense:  128 g    Refill:  2    -Patient was evaluated and treated. All patient's and/or POA's questions/concerns answered on today's visit. -Continue foot and shoe inspections daily. Monitor blood glucose per PCP/Endocrinologist's recommendations. -Patient to continue soft, supportive shoe gear daily. -Toenails 1-5 b/l were debrided in length and girth with sterile nail nippers and dremel without iatrogenic bleeding.  -Rx written for Miconazole Spray Powder 2% antifungal spray powder. Spray between toes once daily. Dry well between toes and under toes after bath/shower. -Patient/POA to call should there be question/concern in the interim.   Return in about 3 months (around 03/18/2023).  Freddie Breech, DPM

## 2023-02-05 ENCOUNTER — Other Ambulatory Visit: Payer: Self-pay | Admitting: Internal Medicine

## 2023-02-15 ENCOUNTER — Ambulatory Visit: Payer: Medicare Other | Admitting: Podiatry

## 2023-02-16 ENCOUNTER — Ambulatory Visit: Payer: Medicare Other | Admitting: Podiatry

## 2023-02-16 DIAGNOSIS — M79676 Pain in unspecified toe(s): Secondary | ICD-10-CM | POA: Diagnosis not present

## 2023-02-16 DIAGNOSIS — B351 Tinea unguium: Secondary | ICD-10-CM

## 2023-02-16 NOTE — Progress Notes (Signed)
    Subjective:  Patient ID: Marcus Johnston, male    DOB: 23-Oct-1930,  MRN: [id number]  Marcus Johnston presents to clinic today for:  Chief Complaint  Patient presents with   Nail Problem    DFC, no concerns   . Patient notes nails are thick, discolored, elongated and painful in shoegear when trying to ambulate.  Patient notes his blood sugar was approximately 144 mg/dL this morning.  States his HbA1c value has been leveling around 7.6%  PCP is Dorothyann Peng, MD.  Allergies  Allergen Reactions   Bidil [Isosorb Dinitrate-Hydralazine] Other (See Comments)    Severe hypotension episode after administering one dose of Bidil 20-37.5 mg on 09/25/2019.    Dapagliflozin Other (See Comments)   Pneumococcal Vac Polyvalent Other (See Comments)   Statins Other (See Comments)    Leg pain    Review of Systems: Negative except as noted in the HPI.  Objective:  There were no vitals filed for this visit.  Marcus Johnston is a pleasant 89 y.o. male in NAD. AAO x 3.  Vascular Examination: Capillary refill time is 3-5 seconds to toes bilateral. Palpable pedal pulses b/l LE. Digital hair present b/l. No pedal edema b/l. Skin temperature gradient WNL b/l. No varicosities b/l. No cyanosis or clubbing noted b/l.   Dermatological Examination: Pedal skin with normal turgor, texture and tone b/l. No open wounds. No interdigital macerations b/l. Toenails x10 are 3mm thick, discolored, dystrophic with subungual debris. There is pain with compression of the nail plates.  They are elongated x10     08/28/2022   12:00 AM 05/01/2022    3:21 PM  Hemoglobin A1C  Hemoglobin-A1c 7.6       7.6     8.2      This result is from an external source.   Multiple values from one day are sorted in reverse-chronological order   Assessment/Plan: 1. Pain due to onychomycosis of toenail     The mycotic toenails were sharply debrided x10 with sterile nail nippers and a power debriding burr to decrease  bulk/thickness and length.    Return in about 3 months (around 05/19/2023) for w/ Dr. Eloy End for [facility].   Clerance Lav, DPM, FACFAS [facility]     [address] [place], [place] [address]                Office [phone]  Fax [phone]

## 2023-02-21 ENCOUNTER — Other Ambulatory Visit: Payer: Self-pay | Admitting: Internal Medicine

## 2023-02-27 ENCOUNTER — Other Ambulatory Visit: Payer: Self-pay | Admitting: Internal Medicine

## 2023-03-01 ENCOUNTER — Other Ambulatory Visit: Payer: Self-pay

## 2023-03-01 MED ORDER — DOXAZOSIN MESYLATE 2 MG PO TABS: 2.0000 mg | ORAL_TABLET | Freq: Every day | ORAL | 1 refills | Status: DC

## 2023-03-03 ENCOUNTER — Other Ambulatory Visit: Payer: Self-pay | Admitting: Internal Medicine

## 2023-03-24 ENCOUNTER — Other Ambulatory Visit: Payer: Self-pay | Admitting: Internal Medicine

## 2023-03-24 DIAGNOSIS — I10 Essential (primary) hypertension: Secondary | ICD-10-CM

## 2023-04-18 ENCOUNTER — Other Ambulatory Visit: Payer: Self-pay | Admitting: Internal Medicine

## 2023-04-23 ENCOUNTER — Other Ambulatory Visit: Payer: Self-pay | Admitting: Internal Medicine

## 2023-04-26 ENCOUNTER — Ambulatory Visit (INDEPENDENT_AMBULATORY_CARE_PROVIDER_SITE_OTHER): Payer: Medicare Other | Admitting: Podiatry

## 2023-04-26 DIAGNOSIS — M79676 Pain in unspecified toe(s): Secondary | ICD-10-CM | POA: Diagnosis not present

## 2023-04-26 DIAGNOSIS — B351 Tinea unguium: Secondary | ICD-10-CM

## 2023-04-26 NOTE — Progress Notes (Signed)
    Subjective:  Patient ID: Marcus Johnston, male    DOB: 01-Jul-1931,  MRN: [id number]  Marcus Johnston presents to clinic today for:  Chief Complaint  Patient presents with   Nail Problem    Pt present for [facility], pt denies pain.   Patient notes nails are thick, discolored, elongated and painful in shoegear when trying to ambulate.  States his nails have been growing pretty fast.   PCP is Dorothyann Peng, MD.  Past Medical History:  Diagnosis Date   Atrial fibrillation (HCC)    Atypical atrial flutter (HCC) 10/17/2018   Diabetes mellitus without complication (HCC)    Diabetic retinopathy (HCC)    NPDR OU   High cholesterol    History of prostate cancer    HTN (hypertension)    Hypertensive retinopathy    OU   Renal insufficiency    Shingles     Past Surgical History:  Procedure Laterality Date   ATRIAL TACH ABLATION N/A 05/08/2019   Procedure: ATRIAL TACH ABLATION;  Surgeon: Marinus Maw, MD;  [facility]: [facility];  Service: Cardiovascular;  Laterality: N/A;   CATARACT EXTRACTION Bilateral    EYE SURGERY Bilateral    Cat Sx   prostate implant      Allergies  Allergen Reactions   Bidil [Isosorb Dinitrate-Hydralazine] Other (See Comments)    Severe hypotension episode after administering one dose of Bidil 20-37.5 mg on 09/25/2019.    Dapagliflozin Other (See Comments)   Pneumococcal Vac Polyvalent Other (See Comments)   Statins Other (See Comments)    Leg pain    Review of Systems: Negative except as noted in the HPI.  Objective:  There were no vitals filed for this visit.  Marcus Johnston is a pleasant 89 y.o. male in NAD. AAO x 3.  Vascular Examination: Capillary refill time is 3-5 seconds to toes bilateral. Palpable pedal pulses b/l LE. Digital hair present b/l.  Skin temperature gradient WNL b/l. No varicosities b/l. No cyanosis noted b/l.   Dermatological Examination: Pedal skin with normal turgor, texture and tone b/l. No open wounds. No  interdigital macerations b/l. Toenails x10 are 3mm thick, discolored, dystrophic with subungual debris. There is pain with compression of the nail plates.  They are elongated x10     08/28/2022   12:00 AM 05/01/2022    3:21 PM  Hemoglobin A1C  Hemoglobin-A1c 7.6       7.6     8.2      This result is from an external source.   Multiple values from one day are sorted in reverse-chronological order    Assessment/Plan: 1. Pain due to onychomycosis of toenail    The mycotic toenails were sharply debrided x10 with sterile nail nippers and a power debriding burr to decrease bulk/thickness and length.    Return in about 9 weeks (around 06/28/2023) for [facility].   Clerance Lav, DPM, FACFAS [facility]     [address] [place], [place] [address]                Office [phone]  Fax [phone]

## 2023-05-11 ENCOUNTER — Ambulatory Visit: Payer: Medicare Other | Admitting: Podiatry

## 2023-05-12 ENCOUNTER — Encounter: Payer: Medicare Other | Admitting: Internal Medicine

## 2023-05-26 ENCOUNTER — Ambulatory Visit: Payer: Medicare Other

## 2023-05-26 ENCOUNTER — Ambulatory Visit: Payer: Medicare Other | Admitting: Internal Medicine

## 2023-05-26 ENCOUNTER — Encounter: Payer: Self-pay | Admitting: Internal Medicine

## 2023-05-26 VITALS — BP 122/60 | HR 84 | Temp 97.8°F | Ht 75.0 in | Wt 212.0 lb

## 2023-05-26 VITALS — BP 122/60 | HR 84 | Temp 97.8°F | Ht 75.0 in | Wt 212.6 lb

## 2023-05-26 DIAGNOSIS — E113313 Type 2 diabetes mellitus with moderate nonproliferative diabetic retinopathy with macular edema, bilateral: Secondary | ICD-10-CM | POA: Diagnosis not present

## 2023-05-26 DIAGNOSIS — N1831 Chronic kidney disease, stage 3a: Secondary | ICD-10-CM | POA: Diagnosis not present

## 2023-05-26 DIAGNOSIS — T148XXA Other injury of unspecified body region, initial encounter: Secondary | ICD-10-CM

## 2023-05-26 DIAGNOSIS — Z794 Long term (current) use of insulin: Secondary | ICD-10-CM

## 2023-05-26 DIAGNOSIS — Z23 Encounter for immunization: Secondary | ICD-10-CM

## 2023-05-26 DIAGNOSIS — I1310 Hypertensive heart and chronic kidney disease without heart failure, with stage 1 through stage 4 chronic kidney disease, or unspecified chronic kidney disease: Secondary | ICD-10-CM

## 2023-05-26 DIAGNOSIS — E1122 Type 2 diabetes mellitus with diabetic chronic kidney disease: Secondary | ICD-10-CM

## 2023-05-26 DIAGNOSIS — Z0000 Encounter for general adult medical examination without abnormal findings: Secondary | ICD-10-CM

## 2023-05-26 DIAGNOSIS — I4719 Other supraventricular tachycardia: Secondary | ICD-10-CM

## 2023-05-26 DIAGNOSIS — L700 Acne vulgaris: Secondary | ICD-10-CM | POA: Insufficient documentation

## 2023-05-26 DIAGNOSIS — E7800 Pure hypercholesterolemia, unspecified: Secondary | ICD-10-CM

## 2023-05-26 NOTE — Patient Instructions (Addendum)
Mr. Blanda , Thank you for taking time to come for your Medicare Wellness Visit. I appreciate your ongoing commitment to your health goals. Please review the following plan we discussed and let me know if I can assist you in the future.   Referrals/Orders/Follow-Ups/Clinician Recommendations: none  This is a list of the screening recommended for you and due dates:  Health Maintenance  Topic Date Due   Eye exam for diabetics  09/11/2022   Hemoglobin A1C  02/26/2023   COVID-19 Vaccine (5 - 2023-24 season) 04/18/2023   Complete foot exam   05/02/2023   Zoster (Shingles) Vaccine (1 of 2) 08/26/2023*   Pneumonia Vaccine (1 of 1 - PCV) 11/02/2023*   Medicare Annual Wellness Visit  05/25/2024   Flu Shot  Completed   HPV Vaccine  Aged Out   DTaP/Tdap/Td vaccine  Discontinued  *Topic was postponed. The date shown is not the original due date.    Advanced directives: (Declined) Advance directive discussed with you today. Even though you declined this today, please call our office should you change your mind, and we can give you the proper paperwork for you to fill out.  Next Medicare Annual Wellness Visit scheduled for next year: No, office will schedule appointment  insert Preventive Care attachment Insert FALL PREVENTION attachment if needed

## 2023-05-26 NOTE — Patient Instructions (Signed)

## 2023-05-26 NOTE — Progress Notes (Signed)
Subjective:   Marcus Johnston is a 89 y.o. male who presents for Medicare Annual/Subsequent preventive examination.  Visit Complete: In person    Cardiac Risk Factors include: advanced age (>66men, >69 women);diabetes mellitus;dyslipidemia;hypertension;male gender     Objective:    Today's Vitals   05/26/23 1004  BP: 122/60  Pulse: 84  Temp: 97.8 F (36.6 C)  TempSrc: Oral  SpO2: 95%  Weight: 212 lb 9.6 oz (96.4 kg)  Height: 6\' 3"  (1.905 m)   Body mass index is 26.57 kg/m.     05/26/2023   10:09 AM 05/07/2022   10:59 AM 05/01/2021   11:49 AM 04/04/2020    9:05 AM 12/17/2019    9:17 PM 05/08/2019    5:58 AM 04/30/2019    6:15 PM  Advanced Directives  Does Patient Have a Medical Advance Directive? No No No No No No No  Would patient like information on creating a medical advance directive? No - Patient declined   No - Patient declined No - Patient declined No - Patient declined No - Patient declined    Current Medications (verified) Outpatient Encounter Medications as of 05/26/2023  Medication Sig   acetaminophen (TYLENOL) 500 MG tablet Take 500 mg by mouth every 6 (six) hours as needed.   amLODipine (NORVASC) 5 MG tablet TAKE 1 TABLET BY MOUTH TWICE  DAILY   aspirin 81 MG chewable tablet    atorvastatin (LIPITOR) 20 MG tablet TAKE 1 TABLET BY MOUTH EVERY MONDAY, WEDNESDAY AND FRIDAY   carvedilol (COREG) 12.5 MG tablet TAKE 1 TABLET BY MOUTH TWICE  DAILY WITH MEALS   Cholecalciferol (VITAMIN D3) 50 MCG (2000 UT) capsule Take 1 capsule by mouth daily.   doxazosin (CARDURA) 2 MG tablet Take 1 tablet (2 mg total) by mouth daily.   Dulaglutide (TRULICITY) 0.75 MG/0.5ML SOPN Inject 0.75 mg into the skin once a week.   fexofenadine (ALLEGRA) 180 MG tablet Take 180 mg by mouth daily as needed (allergies.).    hydrochlorothiazide (HYDRODIURIL) 25 MG tablet TAKE 1 TABLET BY MOUTH DAILY   insulin lispro protamine-lispro (HUMALOG 50/50 MIX) (50-50) 100 UNIT/ML SUSP injection  Inject 10-15 Units into the skin See admin instructions. Inject 25 units subcutaneously with breakfast and 22 units with supper   Insulin Syringe-Needle U-100 (EASY TOUCH INSULIN SYRINGE) 30G X 5/16" 0.5 ML MISC USE 3 TIMES DAILY   losartan (COZAAR) 100 MG tablet TAKE 1 TABLET BY MOUTH DAILY   metFORMIN (GLUCOPHAGE-XR) 500 MG 24 hr tablet Take 1,000 mg by mouth 2 (two) times daily with a meal.    acetaminophen (TYLENOL) 325 MG tablet Take 650 mg by mouth every 6 (six) hours as needed for mild pain or fever. (Patient not taking: Reported on 05/21/2022)   dapagliflozin propanediol (FARXIGA) 5 MG TABS tablet 1 tablet (Patient not taking: Reported on 05/21/2022)   Miconazole Nitrate 2 % AERP Spray between toes once daily. (Patient not taking: Reported on 05/26/2023)   No facility-administered encounter medications on file as of 05/26/2023.    Allergies (verified) Bidil [isosorb dinitrate-hydralazine], Dapagliflozin, Pneumococcal vac polyvalent, and Statins   History: Past Medical History:  Diagnosis Date   Atrial fibrillation (HCC)    Atypical atrial flutter (HCC) 10/17/2018   Diabetes mellitus without complication (HCC)    Diabetic retinopathy (HCC)    NPDR OU   High cholesterol    History of prostate cancer    HTN (hypertension)    Hypertensive retinopathy    OU   Renal insufficiency  Shingles    Past Surgical History:  Procedure Laterality Date   ATRIAL TACH ABLATION N/A 05/08/2019   Procedure: ATRIAL TACH ABLATION;  Surgeon: Marinus Maw, MD;  [facility];  Service: Cardiovascular;  Laterality: N/A;   CATARACT EXTRACTION Bilateral    EYE SURGERY Bilateral    Cat Sx   prostate implant     Family History  Problem Relation Age of Onset   Cancer Mother    Cancer Father    Social History   Socioeconomic History   Marital status: Married    Spouse name: Not on file   Number of children: 5   Years of education: Not on file   Highest education level: Not on  file  Occupational History   Occupation: retired  Tobacco Use   Smoking status: Never   Smokeless tobacco: Never  Vaping Use   Vaping status: Never Used  Substance and Sexual Activity   Alcohol use: No   Drug use: No   Sexual activity: Yes  Other Topics Concern   Not on file  Social History Narrative   Not on file   Social Determinants of Health   Financial Resource Strain: Low Risk  (05/26/2023)   Overall Financial Resource Strain (CARDIA)    Difficulty of Paying Living Expenses: Not hard at all  Food Insecurity: No Food Insecurity (05/26/2023)   Hunger Vital Sign    Worried About Running Out of Food in the Last Year: Never true    Ran Out of Food in the Last Year: Never true  Transportation Needs: No Transportation Needs (05/26/2023)   PRAPARE - Administrator, Civil Service (Medical): No    Lack of Transportation (Non-Medical): No  Physical Activity: Sufficiently Active (05/26/2023)   Exercise Vital Sign    Days of Exercise per Week: 3 days    Minutes of Exercise per Session: 60 min  Stress: No Stress Concern Present (05/26/2023)   [employer] of Occupational Health - Occupational Stress Questionnaire    Feeling of Stress : Not at all  Social Connections: Moderately Isolated (05/26/2023)   Social Connection and Isolation Panel [NHANES]    Frequency of Communication with Friends and Family: Once a week    Frequency of Social Gatherings with Friends and Family: Never    Attends Religious Services: Never    Database administrator or Organizations: Yes    Attends Engineer, structural: More than 4 times per year    Marital Status: Married    Tobacco Counseling Counseling given: Not Answered   Clinical Intake:  Pre-visit preparation completed: Yes  Pain : No/denies pain     Nutritional Status: BMI 25 -29 Overweight Nutritional Risks: None Diabetes: Yes CBG done?: No Did pt. bring in CBG monitor from home?: No  How often do you need to  have someone help you when you read instructions, pamphlets, or other written materials from your doctor or pharmacy?: 1 - Never  Interpreter Needed?: No  Information entered by :: NAllen LPN   Activities of Daily Living    05/26/2023   10:05 AM  In your present state of health, do you have any difficulty performing the following activities:  Hearing? 0  Vision? 0  Difficulty concentrating or making decisions? 0  Walking or climbing stairs? 0  Dressing or bathing? 0  Doing errands, shopping? 0  Preparing Food and eating ? N  Using the Toilet? N  In the past six months, have  you accidently leaked urine? N  Do you have problems with loss of bowel control? N  Managing your Medications? N  Managing your Finances? N  Housekeeping or managing your Housekeeping? N    Patient Care Team: Dorothyann Peng, MD as PCP - General (Internal Medicine) Jodelle Red, MD as PCP - Cardiology (Cardiology) Caudill, Maryjane Hurter, [facility] (Inactive) (Pharmacist) [facility] (Ophthalmology)  Indicate any recent Medical Services you may have received from other than [facility] providers in the past year (date may be approximate).     Assessment:   This is a routine wellness examination for Monterio.  Hearing/Vision screen Hearing Screening - Comments:: Denies hearing issues Vision Screening - Comments:: Regular eye exams, [facility]   Goals Addressed             This Visit's Progress    Patient Stated       05/26/2023, stay healthy       Depression Screen    05/26/2023   10:10 AM 05/07/2022   11:00 AM 05/01/2022    2:19 PM 05/01/2021   11:50 AM 04/16/2021    9:06 AM 04/04/2020    9:06 AM 04/03/2020   10:05 AM  PHQ 2/9 Scores  PHQ - 2 Score 0 0 0 0 0 0 0  PHQ- 9 Score 0     0     Fall Risk    05/26/2023   10:10 AM 05/07/2022   10:59 AM 05/01/2022    2:19 PM 05/01/2021   11:50 AM 04/16/2021    9:06 AM  Fall Risk   Falls in the past year? 0 0 0 0 0  Number falls  in past yr: 0 0 0  0  Injury with Fall? 0 0 0  0  Risk for fall due to : Medication side effect Medication side effect No Fall Risks Medication side effect No Fall Risks  Follow up Falls prevention discussed;Falls evaluation completed Falls prevention discussed;Education provided;Falls evaluation completed Falls evaluation completed Falls evaluation completed;Education provided;Falls prevention discussed     MEDICARE RISK AT HOME: Medicare Risk at Home Any stairs in or around the home?: Yes If so, are there any without handrails?: No Home free of loose throw rugs in walkways, pet beds, electrical cords, etc?: Yes Adequate lighting in your home to reduce risk of falls?: Yes Life alert?: No Use of a cane, walker or w/c?: No Grab bars in the bathroom?: No Shower chair or bench in shower?: No Elevated toilet seat or a handicapped toilet?: Yes  TIMED UP AND GO:  Was the test performed?  Yes  Length of time to ambulate 10 feet: 5 sec Gait steady and fast without use of assistive device    Cognitive Function:        05/26/2023   10:11 AM 05/07/2022   11:00 AM 05/01/2021   11:51 AM 04/04/2020    9:07 AM 03/23/2019   10:57 AM  6CIT Screen  What Year? 0 points 0 points 0 points 0 points 0 points  What month? 0 points 0 points 0 points 0 points 0 points  What time? 0 points 0 points 0 points 0 points 0 points  Count back from 20 0 points 0 points 0 points 0 points 0 points  Months in reverse 0 points 0 points 0 points 0 points 2 points  Repeat phrase 0 points 2 points 2 points 4 points 0 points  Total Score 0 points 2 points 2 points 4 points 2  points    Immunizations Immunization History  Administered Date(s) Administered   Fluad Quad(high Dose 65+) 05/02/2019, 06/13/2020, 05/01/2021, 05/01/2022   Influenza-Unspecified 05/17/2001, 08/16/2003   PFIZER Comirnaty(Gray Top)Covid-19 Tri-Sucrose Vaccine 09/17/2022   PFIZER(Purple Top)SARS-COV-2 Vaccination 09/22/2019, 10/13/2019,  07/08/2020   Pneumococcal-Unspecified 02/08/2002    TDAP status: Up to date  Flu Vaccine status: Completed at today's visit  Pneumococcal vaccine status: Declined,  Education has been provided regarding the importance of this vaccine but patient still declined. Advised may receive this vaccine at local pharmacy or Health Dept. Aware to provide a copy of the vaccination record if obtained from local pharmacy or Health Dept. Verbalized acceptance and understanding.   Covid-19 vaccine status: Information provided on how to obtain vaccines.   Qualifies for Shingles Vaccine? Yes   Zostavax completed No   Shingrix Completed?: No.    Education has been provided regarding the importance of this vaccine. Patient has been advised to call insurance company to determine out of pocket expense if they have not yet received this vaccine. Advised may also receive vaccine at local pharmacy or Health Dept. Verbalized acceptance and understanding.  Screening Tests Health Maintenance  Topic Date Due   OPHTHALMOLOGY EXAM  09/11/2022   HEMOGLOBIN A1C  02/26/2023   INFLUENZA VACCINE  03/18/2023   COVID-19 Vaccine (5 - 2023-24 season) 04/18/2023   FOOT EXAM  05/02/2023   Zoster Vaccines- Shingrix (1 of 2) 08/26/2023 (Originally 04/22/1950)   Pneumonia Vaccine 1+ Years old (1 of 1 - PCV) 11/02/2023 (Originally 04/22/1998)   Medicare Annual Wellness (AWV)  05/25/2024   HPV VACCINES  Aged Out   DTaP/Tdap/Td  Discontinued    Health Maintenance  Health Maintenance Due  Topic Date Due   OPHTHALMOLOGY EXAM  09/11/2022   HEMOGLOBIN A1C  02/26/2023   INFLUENZA VACCINE  03/18/2023   COVID-19 Vaccine (5 - 2023-24 season) 04/18/2023   FOOT EXAM  05/02/2023    Colorectal cancer screening: No longer required.   Lung Cancer Screening: (Low Dose CT Chest recommended if Age 52-80 years, 20 pack-year currently smoking OR have quit w/in 15years.) does not qualify.   Lung Cancer Screening Referral: no  Additional  Screening:  Hepatitis C Screening: does not qualify;   Vision Screening: Recommended annual ophthalmology exams for early detection of glaucoma and other disorders of the eye. Is the patient up to date with their annual eye exam?  Yes  Who is the provider or what is the name of the office in which the patient attends annual eye exams? [facility] If pt is not established with a provider, would they like to be referred to a provider to establish care? No .   Dental Screening: Recommended annual dental exams for proper oral hygiene  Diabetic Foot Exam: Diabetic Foot Exam: Completed 04/26/2023  Community Resource Referral / Chronic Care Management: CRR required this visit?  No   CCM required this visit?  No     Plan:     I have personally reviewed and noted the following in the patient's chart:   Medical and social history Use of alcohol, tobacco or illicit drugs  Current medications and supplements including opioid prescriptions. Patient is not currently taking opioid prescriptions. Functional ability and status Nutritional status Physical activity Advanced directives List of other physicians Hospitalizations, surgeries, and ER visits in previous 12 months Vitals Screenings to include cognitive, depression, and falls Referrals and appointments  In addition, I have reviewed and discussed with patient certain preventive protocols, quality metrics,  and best practice recommendations. A written personalized care plan for preventive services as well as general preventive health recommendations were provided to patient.     Barb Merino, LPN   40/04/8118   After Visit Summary: (In Person-Printed) AVS printed and given to the patient  Nurse Notes: none

## 2023-05-26 NOTE — Assessment & Plan Note (Signed)
Regarding abdominal discomfort when he sneezes, I think his sx are due to muscle tightness/strain. Since it does not occur all of the time, I advised him to consider what type of workout he did day of or the day before.

## 2023-05-26 NOTE — Assessment & Plan Note (Signed)
Chronic, well controlled. She will continue with amlodipine 5mg  daily, carvedilol 12.5mg  twice daily and hydrochlorothiazide 25mg  daily. He is also taking losartan 100mg  daily. Encouraged to follow low sodium diet.

## 2023-05-26 NOTE — Progress Notes (Unsigned)
I,Victoria T Deloria Lair, CMA,acting as a Neurosurgeon for Gwynneth Aliment, MD.,have documented all relevant documentation on the behalf of Gwynneth Aliment, MD,as directed by  Gwynneth Aliment, MD while in the presence of Gwynneth Aliment, MD.  Subjective:  Patient ID: Marcus Johnston , male    DOB: 04-10-1931 , 89 y.o.   MRN: [id number]  Chief Complaint  Patient presents with   Diabetes   Hyperlipidemia    HPI  Patient presents today for blood pressure, diabetes & cholesterol follow up. He reports compliance with medications. Denies headache, chest pain, SOB.  He is followed by Dr. Sharl Ma for his diabetes. His last A1c was 7.2.   AWV completed with [facility] Advisor: Nickeah     Hypertension This is a chronic problem. The current episode started more than 1 year ago. The problem has been gradually improving since onset. The problem is uncontrolled. Pertinent negatives include no blurred vision, chest pain, palpitations or shortness of breath. Risk factors for coronary artery disease include diabetes mellitus, dyslipidemia, male gender and sedentary lifestyle. Past treatments include beta blockers. There are no compliance problems.  Identifiable causes of hypertension include chronic renal disease.  Diabetes He presents for his follow-up diabetic visit. He has type 2 diabetes mellitus. There are no hypoglycemic associated symptoms. Pertinent negatives for diabetes include no blurred vision and no chest pain. There are no hypoglycemic complications. Risk factors for coronary artery disease include diabetes mellitus, dyslipidemia, hypertension and male sex. He is compliant with treatment some of the time. He is following a diabetic diet. He participates in exercise intermittently. An ACE inhibitor/angiotensin II receptor blocker is being taken.  Hyperlipidemia This is a chronic problem. The current episode started more than 1 year ago. Exacerbating diseases include chronic renal disease and diabetes. Pertinent  negatives include no chest pain, leg pain, myalgias or shortness of breath. Current antihyperlipidemic treatment includes statins. Risk factors for coronary artery disease include hypertension, diabetes mellitus and dyslipidemia.     Past Medical History:  Diagnosis Date   Atrial fibrillation (HCC)    Atypical atrial flutter (HCC) 10/17/2018   Diabetes mellitus without complication (HCC)    Diabetic retinopathy (HCC)    NPDR OU   High cholesterol    History of prostate cancer    HTN (hypertension)    Hypertensive retinopathy    OU   Renal insufficiency    Shingles      Family History  Problem Relation Age of Onset   Cancer Mother    Cancer Father      Current Outpatient Medications:    acetaminophen (TYLENOL) 325 MG tablet, Take 650 mg by mouth every 6 (six) hours as needed for mild pain or fever. (Patient not taking: Reported on 05/21/2022), Disp: , Rfl:    acetaminophen (TYLENOL) 500 MG tablet, Take 500 mg by mouth every 6 (six) hours as needed., Disp: , Rfl:    amLODipine (NORVASC) 5 MG tablet, TAKE 1 TABLET BY MOUTH TWICE  DAILY, Disp: 180 tablet, Rfl: 3   aspirin 81 MG chewable tablet, , Disp: , Rfl:    atorvastatin (LIPITOR) 20 MG tablet, TAKE 1 TABLET BY MOUTH EVERY MONDAY, WEDNESDAY AND FRIDAY, Disp: 12 tablet, Rfl: 2   carvedilol (COREG) 12.5 MG tablet, TAKE 1 TABLET BY MOUTH TWICE  DAILY WITH MEALS, Disp: 180 tablet, Rfl: 3   Cholecalciferol (VITAMIN D3) 50 MCG (2000 UT) capsule, Take 1 capsule by mouth daily., Disp: , Rfl:    dapagliflozin propanediol (FARXIGA) 5  MG TABS tablet, 1 tablet (Patient not taking: Reported on 05/21/2022), Disp: , Rfl:    doxazosin (CARDURA) 2 MG tablet, Take 1 tablet (2 mg total) by mouth daily., Disp: 90 tablet, Rfl: 1   Dulaglutide (TRULICITY) 0.75 MG/0.5ML SOPN, Inject 0.75 mg into the skin once a week., Disp: , Rfl:    fexofenadine (ALLEGRA) 180 MG tablet, Take 180 mg by mouth daily as needed (allergies.). , Disp: , Rfl:     hydrochlorothiazide (HYDRODIURIL) 25 MG tablet, TAKE 1 TABLET BY MOUTH DAILY, Disp: 90 tablet, Rfl: 3   insulin lispro protamine-lispro (HUMALOG 50/50 MIX) (50-50) 100 UNIT/ML SUSP injection, Inject 10-15 Units into the skin See admin instructions. Inject 25 units subcutaneously with breakfast and 22 units with supper, Disp: , Rfl:    Insulin Syringe-Needle U-100 (EASY TOUCH INSULIN SYRINGE) 30G X 5/16" 0.5 ML MISC, USE 3 TIMES DAILY, Disp: 270 each, Rfl: 3   losartan (COZAAR) 100 MG tablet, TAKE 1 TABLET BY MOUTH DAILY, Disp: 90 tablet, Rfl: 3   metFORMIN (GLUCOPHAGE-XR) 500 MG 24 hr tablet, Take 1,000 mg by mouth 2 (two) times daily with a meal. , Disp: , Rfl:    Miconazole Nitrate 2 % AERP, Spray between toes once daily. (Patient not taking: Reported on 05/26/2023), Disp: 128 g, Rfl: 2   Allergies  Allergen Reactions   Bidil [Isosorb Dinitrate-Hydralazine] Other (See Comments)    Severe hypotension episode after administering one dose of Bidil 20-37.5 mg on 09/25/2019.    Dapagliflozin Other (See Comments)   Pneumococcal Vac Polyvalent Other (See Comments)   Statins Other (See Comments)    Leg pain     Review of Systems  Constitutional: Negative.   HENT: Negative.    Eyes:  Negative for blurred vision.  Respiratory: Negative.  Negative for shortness of breath.   Cardiovascular: Negative.  Negative for chest pain and palpitations.  Gastrointestinal:        He states he sometimes has abdominal discomfort when he sneezes. Does not occur all of the time. He is working out regularly at [employer].  Musculoskeletal:  Negative for myalgias.  Skin: Negative.   Allergic/Immunologic: Negative.   Neurological: Negative.   Hematological: Negative.      Today's Vitals   05/26/23 1018  BP: 122/60  Pulse: 84  Temp: 97.8 F (36.6 C)  SpO2: 95%  Weight: 212 lb (96.2 kg)  Height: 6\' 3"  (1.905 m)   Body mass index is 26.5 kg/m.  Wt Readings from Last 3 Encounters:  05/26/23 212 lb (96.2 kg)   05/26/23 212 lb 9.6 oz (96.4 kg)  11/02/22 215 lb 3.2 oz (97.6 kg)     Objective:  Physical Exam Vitals and nursing note reviewed.  Constitutional:      Appearance: Normal appearance.  HENT:     Head: Normocephalic and atraumatic.  Eyes:     Extraocular Movements: Extraocular movements intact.  Cardiovascular:     Rate and Rhythm: Normal rate and regular rhythm.     Heart sounds: Normal heart sounds.  Pulmonary:     Effort: Pulmonary effort is normal.     Breath sounds: Normal breath sounds.  Musculoskeletal:     Cervical back: Normal range of motion.  Skin:    General: Skin is warm.     Comments: Comedone upper-abdomen  Neurological:     General: No focal deficit present.     Mental Status: He is alert.  Psychiatric:        Mood and  Affect: Mood normal.         Assessment And Plan:  Type 2 diabetes mellitus with stage 3a chronic kidney disease, with long-term current use of insulin (HCC) Assessment & Plan: Chronic, also followed by Dr. Sharl Ma. July 2024 A1c 7.2. I will check an A1c today. He will rto in six months for his physical examination.   Orders: -     CMP14+EGFR -     Lipid panel -     Hemoglobin A1c -     CBC -     Microalbumin / creatinine urine ratio  Moderate nonproliferative diabetic retinopathy of both eyes with macular edema associated with type 2 diabetes mellitus (HCC)  Pure hypercholesterolemia Assessment & Plan: Chronic, LDL goal is less than 70.  He will continue with MWF dosing for now. If not at goal, will consider Mon-Friday dosing. Encouraged to follow heart healthy lifestyle.   Orders: -     CMP14+EGFR -     Lipid panel  Malignant HTN with heart disease, w/o CHF, with chronic kidney disease Assessment & Plan: Chronic, well controlled. She will continue with amlodipine 5mg  daily, carvedilol 12.5mg  twice daily and hydrochlorothiazide 25mg  daily. He is also taking losartan 100mg  daily. Encouraged to follow low sodium diet.   Orders: -      CMP14+EGFR -     Lipid panel  Paroxysmal atrial tachycardia [facility]) Assessment & Plan: He is s/p ablation by Dr. Ladona Ridgel. He does not require anticoagulation.    Acne comedone -     Ambulatory referral to Dermatology  Muscle strain Assessment & Plan: Regarding abdominal discomfort when he sneezes, I think his sx are due to muscle tightness/strain. Since it does not occur all of the time, I advised him to consider what type of workout he did day of or the day before.       Return in 6 months (on 11/24/2023), or physical exam, for 1 year ov w thn & rs.  Patient was given opportunity to ask questions. Patient verbalized understanding of the plan and was able to repeat key elements of the plan. All questions were answered to their satisfaction.    I, Gwynneth Aliment, MD, have reviewed all documentation for this visit. The documentation on 05/26/23 for the exam, diagnosis, procedures, and orders are all accurate and complete.   IF YOU HAVE BEEN REFERRED TO A SPECIALIST, IT MAY TAKE 1-2 WEEKS TO SCHEDULE/PROCESS THE REFERRAL. IF YOU HAVE NOT HEARD FROM US/SPECIALIST IN TWO WEEKS, PLEASE GIVE [place] A CALL AT [phone].   THE PATIENT IS ENCOURAGED TO PRACTICE SOCIAL DISTANCING DUE TO THE COVID-19 PANDEMIC.

## 2023-05-26 NOTE — Assessment & Plan Note (Signed)
Chronic, also followed by Dr. Sharl Ma. July 2024 A1c 7.2. I will check an A1c today. He will rto in six months for his physical examination.

## 2023-05-27 LAB — CBC
Hematocrit: 41 % (ref 37.5–51.0)
Hemoglobin: 13.3 g/dL (ref 13.0–17.7)
MCH: 28.4 pg (ref 26.6–33.0)
MCHC: 32.4 g/dL (ref 31.5–35.7)
MCV: 87 fL (ref 79–97)
Platelets: 185 10*3/uL (ref 150–450)
RBC: 4.69 x10E6/uL (ref 4.14–5.80)
RDW: 13.5 % (ref 11.6–15.4)
WBC: 3.9 10*3/uL (ref 3.4–10.8)

## 2023-05-27 LAB — CMP14+EGFR
ALT: 11 [IU]/L (ref 0–44)
AST: 15 [IU]/L (ref 0–40)
Albumin: 4.3 g/dL (ref 3.6–4.6)
Alkaline Phosphatase: 87 [IU]/L (ref 44–121)
BUN/Creatinine Ratio: 16 (ref 10–24)
BUN: 21 mg/dL (ref 10–36)
Bilirubin Total: 0.6 mg/dL (ref 0.0–1.2)
CO2: 21 mmol/L (ref 20–29)
Calcium: 9.7 mg/dL (ref 8.6–10.2)
Chloride: 105 mmol/L (ref 96–106)
Creatinine, Ser: 1.29 mg/dL — ABNORMAL HIGH (ref 0.76–1.27)
Globulin, Total: 2.5 g/dL (ref 1.5–4.5)
Glucose: 115 mg/dL — ABNORMAL HIGH (ref 70–99)
Potassium: 4.4 mmol/L (ref 3.5–5.2)
Sodium: 141 mmol/L (ref 134–144)
Total Protein: 6.8 g/dL (ref 6.0–8.5)
eGFR: 53 mL/min/{1.73_m2} — ABNORMAL LOW (ref >59)

## 2023-05-27 LAB — MICROALBUMIN / CREATININE URINE RATIO
Creatinine, Urine: 133.9 mg/dL
Microalb/Creat Ratio: 90 mg/g{creat} — ABNORMAL HIGH (ref 0–29)
Microalbumin, Urine: 120.2 ug/mL

## 2023-05-27 LAB — LIPID PANEL
Chol/HDL Ratio: 1.6 {ratio} (ref 0.0–5.0)
Cholesterol, Total: 126 mg/dL (ref 100–199)
HDL: 77 mg/dL (ref >39)
LDL Chol Calc (NIH): 36 mg/dL (ref 0–99)
Triglycerides: 60 mg/dL (ref 0–149)
VLDL Cholesterol Cal: 13 mg/dL (ref 5–40)

## 2023-05-27 LAB — HEMOGLOBIN A1C
Est. average glucose Bld gHb Est-mCnc: 166 mg/dL
Hgb A1c MFr Bld: 7.4 % — ABNORMAL HIGH (ref 4.8–5.6)

## 2023-05-27 NOTE — Assessment & Plan Note (Signed)
Chronic, LDL goal is less than 70.  He will continue with MWF dosing for now. If not at goal, will consider Mon-Friday dosing. Encouraged to follow heart healthy lifestyle.

## 2023-05-27 NOTE — Assessment & Plan Note (Signed)
He is s/p ablation by Dr. Ladona Ridgel. He does not require anticoagulation.

## 2023-07-06 ENCOUNTER — Other Ambulatory Visit: Payer: Self-pay | Admitting: Internal Medicine

## 2023-07-06 ENCOUNTER — Ambulatory Visit: Payer: Medicare Other | Admitting: Podiatry

## 2023-07-06 ENCOUNTER — Encounter: Payer: Self-pay | Admitting: Podiatry

## 2023-07-06 DIAGNOSIS — M79676 Pain in unspecified toe(s): Secondary | ICD-10-CM

## 2023-07-06 DIAGNOSIS — B351 Tinea unguium: Secondary | ICD-10-CM | POA: Diagnosis not present

## 2023-07-06 NOTE — Progress Notes (Signed)
       Subjective:  Patient ID: Marcus Johnston, male    DOB: October 01, 1930,  MRN: [id number]  Marcus Johnston presents to clinic today for:  Chief Complaint  Patient presents with   Routine Post Op    PATIENT STATES HE IS HERE TO GET HIS TOE NAILS CUT ,    Patient notes nails are thick, discolored, elongated and painful in shoegear when trying to ambulate.    PCP is Dorothyann Peng, MD.  Past Medical History:  Diagnosis Date   Atrial fibrillation [facility])    Atypical atrial flutter (HCC) 10/17/2018   Diabetes mellitus without complication (HCC)    Diabetic retinopathy (HCC)    NPDR OU   High cholesterol    History of prostate cancer    HTN (hypertension)    Hypertensive retinopathy    OU   Renal insufficiency    Shingles     Past Surgical History:  Procedure Laterality Date   ATRIAL TACH ABLATION N/A 05/08/2019   Procedure: ATRIAL TACH ABLATION;  Surgeon: Marinus Maw, MD;  [facility];  Service: Cardiovascular;  Laterality: N/A;   CATARACT EXTRACTION Bilateral    EYE SURGERY Bilateral    Cat Sx   prostate implant      Allergies  Allergen Reactions   Bidil [Isosorb Dinitrate-Hydralazine] Other (See Comments)    Severe hypotension episode after administering one dose of Bidil 20-37.5 mg on 09/25/2019.    Dapagliflozin Other (See Comments)   Pneumococcal Vac Polyvalent Other (See Comments)   Statins Other (See Comments)    Leg pain    Review of Systems: Negative except as noted in the HPI.  Objective:  Marcus Johnston is a pleasant 89 y.o. male in NAD. AAO x 3.  Vascular Examination: Capillary refill time is 3-5 seconds to toes bilateral. Palpable pedal pulses b/l LE. Digital hair present b/l.  Skin temperature gradient WNL b/l. No varicosities b/l. No cyanosis noted b/l.   Dermatological Examination: Pedal skin with normal turgor, texture and tone b/l. No open wounds. No interdigital macerations b/l. Toenails x10 are 3mm thick, discolored,  dystrophic with subungual debris. There is pain with compression of the nail plates.  They are elongated x10     Latest Ref Rng & Units 05/26/2023   11:09 AM 08/28/2022   12:00 AM  Hemoglobin A1C  Hemoglobin-A1c 4.8 - 5.6 % 7.4  7.6       7.6         This result is from an external source.   Multiple values from one day are sorted in reverse-chronological order   Assessment/Plan: 1. Pain due to onychomycosis of toenail    The mycotic toenails were sharply debrided x10 with sterile nail nippers and a power debriding burr to decrease bulk/thickness and length.    Return in about 3 months (around 10/06/2023) for RFC.   Marcus Johnston, DPM, FACFAS [facility]     [address] [place], [place] [address]                Office [phone]  Fax [phone]

## 2023-07-07 ENCOUNTER — Ambulatory Visit: Payer: Medicare Other | Admitting: Podiatry

## 2023-07-26 ENCOUNTER — Ambulatory Visit: Payer: Medicare Other | Admitting: Podiatry

## 2023-08-21 ENCOUNTER — Other Ambulatory Visit: Payer: Self-pay | Admitting: Internal Medicine

## 2023-09-08 ENCOUNTER — Other Ambulatory Visit: Payer: Self-pay

## 2023-09-08 MED ORDER — DOXAZOSIN MESYLATE 2 MG PO TABS: 2.0000 mg | ORAL_TABLET | Freq: Every day | ORAL | 1 refills | Status: DC

## 2023-09-13 ENCOUNTER — Ambulatory Visit: Payer: Medicare Other | Admitting: Podiatry

## 2023-09-13 DIAGNOSIS — M79675 Pain in left toe(s): Secondary | ICD-10-CM

## 2023-09-13 DIAGNOSIS — M79674 Pain in right toe(s): Secondary | ICD-10-CM | POA: Diagnosis not present

## 2023-09-13 DIAGNOSIS — B351 Tinea unguium: Secondary | ICD-10-CM | POA: Diagnosis not present

## 2023-09-13 NOTE — Progress Notes (Signed)
       Subjective:  Patient ID: Marcus Johnston, male    DOB: June 06, 1931,  MRN: [id number]   Marcus Johnston presents to clinic today for:  Chief Complaint  Patient presents with   Diabetes    DFC A1C - 7.4   Patient notes nails are thick, discolored, elongated and painful in shoegear when trying to ambulate.    PCP is Dorothyann Peng, MD.  Past Medical History:  Diagnosis Date   Atrial fibrillation [facility])    Atypical atrial flutter (HCC) 10/17/2018   Diabetes mellitus without complication (HCC)    Diabetic retinopathy (HCC)    NPDR OU   High cholesterol    History of prostate cancer    HTN (hypertension)    Hypertensive retinopathy    OU   Renal insufficiency    Shingles     Past Surgical History:  Procedure Laterality Date   ATRIAL TACH ABLATION N/A 05/08/2019   Procedure: ATRIAL TACH ABLATION;  Surgeon: Marinus Maw, MD;  [facility];  Service: Cardiovascular;  Laterality: N/A;   CATARACT EXTRACTION Bilateral    EYE SURGERY Bilateral    Cat Sx   prostate implant      Allergies  Allergen Reactions   Bidil [Isosorb Dinitrate-Hydralazine] Other (See Comments)    Severe hypotension episode after administering one dose of Bidil 20-37.5 mg on 09/25/2019.    Dapagliflozin Other (See Comments)   Pneumococcal Vac Polyvalent Other (See Comments)   Statins Other (See Comments)    Leg pain    Review of Systems: Negative except as noted in the HPI.  Objective:  [name] is a pleasant 89 y.o. male in NAD. AAO x 3.  Vascular Examination: Capillary refill time is 3-5 seconds to toes bilateral. Palpable pedal pulses b/l LE. Digital hair present b/l.  Skin temperature gradient WNL b/l. No varicosities b/l. No cyanosis noted b/l.   Dermatological Examination: Pedal skin with normal turgor, texture and tone b/l. No open wounds. No interdigital macerations b/l. Toenails x10 are 3mm thick, discolored, dystrophic with subungual debris. There is pain with  compression of the nail plates.  They are elongated x10     Latest Ref Rng & Units 05/26/2023   11:09 AM  Hemoglobin A1C  Hemoglobin-A1c 4.8 - 5.6 % 7.4    Assessment/Plan: 1. Pain due to onychomycosis of toenails of both feet    The mycotic toenails were sharply debrided x10 with sterile nail nippers and a power debriding burr to decrease bulk/thickness and length.    Return in about 3 months (around 12/12/2023) for [facility].   Clerance Lav, DPM, FACFAS [facility]     [address] [place], [place] [address]                Office [phone]  Fax [phone]

## 2023-10-02 ENCOUNTER — Other Ambulatory Visit: Payer: Self-pay | Admitting: Internal Medicine

## 2023-10-11 ENCOUNTER — Ambulatory Visit: Payer: Medicare Other | Admitting: Podiatry

## 2023-11-16 ENCOUNTER — Encounter: Payer: Self-pay | Admitting: Podiatry

## 2023-11-16 ENCOUNTER — Ambulatory Visit (INDEPENDENT_AMBULATORY_CARE_PROVIDER_SITE_OTHER): Payer: Medicare Other | Admitting: Podiatry

## 2023-11-16 VITALS — Ht 75.0 in | Wt 212.0 lb

## 2023-11-16 DIAGNOSIS — B351 Tinea unguium: Secondary | ICD-10-CM

## 2023-11-16 DIAGNOSIS — M79674 Pain in right toe(s): Secondary | ICD-10-CM | POA: Diagnosis not present

## 2023-11-16 DIAGNOSIS — M79675 Pain in left toe(s): Secondary | ICD-10-CM | POA: Diagnosis not present

## 2023-11-16 NOTE — Progress Notes (Unsigned)
       Subjective:  Patient ID: Marcus Johnston, male    DOB: Mar 31, 1931,  MRN: [id number]   Marcus Johnston presents to clinic today for:  Chief Complaint  Patient presents with   Nail Problem    Pt is here for [facility].   Patient notes nails are thick, discolored, elongated and painful in shoegear when trying to ambulate.    PCP is Dorothyann Peng, MD.  Past Medical History:  Diagnosis Date   Atrial fibrillation [facility])    Atypical atrial flutter (HCC) 10/17/2018   Diabetes mellitus without complication (HCC)    Diabetic retinopathy (HCC)    NPDR OU   High cholesterol    History of prostate cancer    HTN (hypertension)    Hypertensive retinopathy    OU   Renal insufficiency    Shingles     Past Surgical History:  Procedure Laterality Date   ATRIAL TACH ABLATION N/A 05/08/2019   Procedure: ATRIAL TACH ABLATION;  Surgeon: Marinus Maw, MD;  [facility];  Service: Cardiovascular;  Laterality: N/A;   CATARACT EXTRACTION Bilateral    EYE SURGERY Bilateral    Cat Sx   prostate implant      Allergies  Allergen Reactions   Bidil [Isosorb Dinitrate-Hydralazine] Other (See Comments)    Severe hypotension episode after administering one dose of Bidil 20-37.5 mg on 09/25/2019.    Dapagliflozin Other (See Comments)   Pneumococcal Vac Polyvalent Other (See Comments)   Statins Other (See Comments)    Leg pain    Review of Systems: Negative except as noted in the HPI.  Objective:  Marcus Johnston is a pleasant 89 y.o. male in NAD. AAO x 3.  Vascular Examination: Capillary refill time is 3-5 seconds to toes bilateral. Palpable pedal pulses b/l LE. Digital hair present b/l.  Skin temperature gradient WNL b/l. No varicosities b/l. No cyanosis noted b/l.   Dermatological Examination: Pedal skin with normal turgor, texture and tone b/l. No open wounds. No interdigital macerations b/l. Toenails x10 are 3mm thick, discolored, dystrophic with subungual debris. There is  pain with compression of the nail plates.  They are elongated x10     Latest Ref Rng & Units 05/26/2023   11:09 AM  Hemoglobin A1C  Hemoglobin-A1c 4.8 - 5.6 % 7.4    Assessment/Plan: 1. Pain due to onychomycosis of toenails of both feet    The mycotic toenails were sharply debrided x10 with sterile nail nippers and a power debriding burr to decrease bulk/thickness and length.    Return in about 3 months (around 02/15/2024) for [facility].   Clerance Lav, DPM, FACFAS [facility]     [address] [place], [place] [address]                Office [phone]  Fax [phone]

## 2023-11-24 ENCOUNTER — Encounter: Payer: Medicare Other | Admitting: Internal Medicine

## 2023-11-29 ENCOUNTER — Other Ambulatory Visit: Payer: Self-pay | Admitting: Internal Medicine

## 2023-12-26 ENCOUNTER — Other Ambulatory Visit: Payer: Self-pay | Admitting: Internal Medicine

## 2024-01-24 ENCOUNTER — Ambulatory Visit: Admitting: Podiatry

## 2024-01-24 DIAGNOSIS — Z794 Long term (current) use of insulin: Secondary | ICD-10-CM

## 2024-01-24 DIAGNOSIS — M79674 Pain in right toe(s): Secondary | ICD-10-CM | POA: Diagnosis not present

## 2024-01-24 DIAGNOSIS — E119 Type 2 diabetes mellitus without complications: Secondary | ICD-10-CM

## 2024-01-24 DIAGNOSIS — M79675 Pain in left toe(s): Secondary | ICD-10-CM

## 2024-01-24 DIAGNOSIS — B351 Tinea unguium: Secondary | ICD-10-CM

## 2024-01-24 NOTE — Progress Notes (Signed)
       Subjective:  Patient ID: Marcus Johnston, male    DOB: [birth year],  MRN: [id number]   Marcus Johnston presents to clinic today for:  Chief Complaint  Patient presents with   [facility]    Last A1c: 7.9. No anticoag. Needs nail care.    Patient notes nails are thick, discolored, elongated and painful in shoegear when trying to ambulate.    PCP is Cleave Curling, MD.  Past Medical History:  Diagnosis Date   Atrial fibrillation [facility])    Atypical atrial flutter (HCC) 10/17/2018   Diabetes mellitus without complication (HCC)    Diabetic retinopathy (HCC)    NPDR OU   High cholesterol    History of prostate cancer    HTN (hypertension)    Hypertensive retinopathy    OU   Renal insufficiency    Shingles     Past Surgical History:  Procedure Laterality Date   ATRIAL TACH ABLATION N/A 05/08/2019   Procedure: ATRIAL TACH ABLATION;  Surgeon: Tammie Fall, MD;  [facility];  Service: Cardiovascular;  Laterality: N/A;   CATARACT EXTRACTION Bilateral    EYE SURGERY Bilateral    Cat Sx   prostate implant      Allergies  Allergen Reactions   Dapagliflozin Other (See Comments)   Isosorb Dinitrate-Hydralazine  Other (See Comments)    Severe hypotension episode after administering one dose of Bidil 20-37.5 mg on 09/25/2019.   Pneumococcal Vac Polyvalent Other (See Comments)    Other Reaction(s): hospitalized   Statins Other (See Comments)    Leg pain    Review of Systems: Negative except as noted in the HPI.  Objective:  Marcus Johnston is a pleasant 89 y.o. male in NAD. AAO x 3.  Vascular Examination: Capillary refill time is 3-5 seconds to toes bilateral. Palpable pedal pulses b/l LE. Digital hair present b/l.  Skin temperature gradient WNL b/l. No varicosities b/l. No cyanosis noted b/l.   Dermatological Examination: Pedal skin with normal turgor, texture and tone b/l. No open wounds. No interdigital macerations b/l. Toenails x10 are 3mm thick, discolored,  dystrophic with subungual debris. There is pain with compression of the nail plates.  They are elongated x10.  Skin is slightly dry to the plantar aspect of both feet     Latest Ref Rng & Units 05/26/2023   11:09 AM  Hemoglobin A1C  Hemoglobin-A1c 4.8 - 5.6 % 7.4    Assessment/Plan: 1. Pain due to onychomycosis of toenails of both feet   2. Type 2 diabetes mellitus without complication, with long-term current use of insulin  (HCC)    The mycotic toenails were sharply debrided x10 with sterile nail nippers and a power debriding burr to decrease bulk/thickness and length.    Return in about 3 months (around 04/25/2024) for [facility].   [name], DPM, FACFAS [facility]     [address] [place], [place] [address]                Office [phone]  Fax [phone]

## 2024-01-31 ENCOUNTER — Other Ambulatory Visit: Payer: Self-pay | Admitting: Internal Medicine

## 2024-01-31 DIAGNOSIS — I10 Essential (primary) hypertension: Secondary | ICD-10-CM

## 2024-02-04 ENCOUNTER — Other Ambulatory Visit: Payer: Self-pay | Admitting: Internal Medicine

## 2024-02-22 ENCOUNTER — Ambulatory Visit: Admitting: Podiatry

## 2024-02-23 ENCOUNTER — Other Ambulatory Visit: Payer: Self-pay | Admitting: Internal Medicine

## 2024-03-19 ENCOUNTER — Other Ambulatory Visit: Payer: Self-pay | Admitting: Internal Medicine

## 2024-03-28 ENCOUNTER — Ambulatory Visit (INDEPENDENT_AMBULATORY_CARE_PROVIDER_SITE_OTHER): Admitting: Podiatry

## 2024-03-28 DIAGNOSIS — B351 Tinea unguium: Secondary | ICD-10-CM | POA: Diagnosis not present

## 2024-03-28 DIAGNOSIS — M79674 Pain in right toe(s): Secondary | ICD-10-CM | POA: Diagnosis not present

## 2024-03-28 DIAGNOSIS — M79675 Pain in left toe(s): Secondary | ICD-10-CM

## 2024-03-28 NOTE — Progress Notes (Signed)
       Subjective:  Patient ID: Marcus Johnston, male    DOB: Oct 14, 1930,  MRN: [id number]  Marcus Johnston presents to clinic today for:  Chief Complaint  Patient presents with   Nail Problem    Thick painful toenails, 9 week follow up    Diabetes    A1C 7.4       Patient notes nails are thick, discolored, elongated and painful in shoegear when trying to ambulate.  He is having some pain along the right hallux toenail, medial border.  Denies any drainage or injury.  PCP is Jarold Medici, MD.  Past Medical History:  Diagnosis Date   Atrial fibrillation [facility])    Atypical atrial flutter (HCC) 10/17/2018   Diabetes mellitus without complication (HCC)    Diabetic retinopathy (HCC)    NPDR OU   High cholesterol    History of prostate cancer    HTN (hypertension)    Hypertensive retinopathy    OU   Renal insufficiency    Shingles    Past Surgical History:  Procedure Laterality Date   ATRIAL TACH ABLATION N/A 05/08/2019   Procedure: ATRIAL TACH ABLATION;  Surgeon: Waddell Danelle ORN, MD;  [facility];  Service: Cardiovascular;  Laterality: N/A;   CATARACT EXTRACTION Bilateral    EYE SURGERY Bilateral    Cat Sx   prostate implant     Allergies  Allergen Reactions   Dapagliflozin Other (See Comments)   Isosorb Dinitrate-Hydralazine  Other (See Comments)    Severe hypotension episode after administering one dose of Bidil 20-37.5 mg on 09/25/2019.   Pneumococcal Vac Polyvalent Other (See Comments)    Other Reaction(s): hospitalized   Statins Other (See Comments)    Leg pain    Review of Systems: Negative except as noted in the HPI.  Objective:  Marcus Johnston is a pleasant 89 y.o. male in NAD. AAO x 3.  Vascular Examination: Capillary refill time is 3-5 seconds to toes bilateral. Palpable pedal pulses b/l LE. Digital hair present b/l.  Skin temperature gradient WNL b/l. No varicosities b/l. No cyanosis noted b/l.   Dermatological Examination: Pedal skin  with normal turgor, texture and tone b/l. No open wounds. No interdigital macerations b/l. Toenails x10 are 3mm thick, discolored, dystrophic with subungual debris. There is pain with compression of the nail plates.  They are elongated x10     Latest Ref Rng & Units 05/26/2023   11:09 AM  Hemoglobin A1C  Hemoglobin-A1c 4.8 - 5.6 % 7.4    Assessment/Plan: 1. Pain due to onychomycosis of toenails of both feet    The mycotic toenails were sharply debrided x10 with sterile nail nippers and a power debriding burr to decrease bulk/thickness and length.  The right hallux medial nail border was cut back to alleviate any discomfort.  No drainage was noted.  Immediate relief was noted  Return in about 3 months (around 06/28/2024) for [facility].   Awanda CHARM Imperial, DPM, FACFAS [facility]     [address] [place], [place] [address]                Office [phone]  Fax [phone]

## 2024-05-24 ENCOUNTER — Encounter: Admitting: Sports Medicine

## 2024-05-30 ENCOUNTER — Ambulatory Visit: Admitting: Podiatry

## 2024-05-30 ENCOUNTER — Encounter: Payer: Self-pay | Admitting: Podiatry

## 2024-05-30 DIAGNOSIS — E119 Type 2 diabetes mellitus without complications: Secondary | ICD-10-CM

## 2024-05-30 DIAGNOSIS — M79674 Pain in right toe(s): Secondary | ICD-10-CM | POA: Diagnosis not present

## 2024-05-30 DIAGNOSIS — B351 Tinea unguium: Secondary | ICD-10-CM | POA: Diagnosis not present

## 2024-05-30 DIAGNOSIS — M79675 Pain in left toe(s): Secondary | ICD-10-CM | POA: Diagnosis not present

## 2024-05-30 DIAGNOSIS — Z794 Long term (current) use of insulin: Secondary | ICD-10-CM

## 2024-05-30 NOTE — Progress Notes (Signed)
       Subjective:  Patient ID: Marcus Johnston, male    DOB: 1931/06/20,  MRN: [id number]  Marcus Johnston presents to clinic today for:  Chief Complaint  Patient presents with   Diabetes    DFC IDDM A1C 7.9. Toenail trim.   Patient notes nails are thick, discolored, elongated and painful in shoegear when trying to ambulate.    PCP is Jarold Medici, MD.  Past Medical History:  Diagnosis Date   Atrial fibrillation [facility])    Atypical atrial flutter (HCC) 10/17/2018   Diabetes mellitus without complication (HCC)    Diabetic retinopathy (HCC)    NPDR OU   High cholesterol    History of prostate cancer    HTN (hypertension)    Hypertensive retinopathy    OU   Renal insufficiency    Shingles    Past Surgical History:  Procedure Laterality Date   ATRIAL TACH ABLATION N/A 05/08/2019   Procedure: ATRIAL TACH ABLATION;  Surgeon: Waddell Danelle ORN, MD;  Location: [facility] INV[facility] LAB;  Service: Cardiovascular;  Laterality: N/A;   CATARACT EXTRACTION Bilateral    EYE SURGERY Bilateral    Cat Sx   prostate implant     Allergies  Allergen Reactions   Dapagliflozin Other (See Comments)   Isosorb Dinitrate-Hydralazine  Other (See Comments)    Severe hypotension episode after administering one dose of Bidil 20-37.5 mg on 09/25/2019.   Pneumococcal Vac Polyvalent Other (See Comments)    Other Reaction(s): hospitalized   Statins Other (See Comments)    Leg pain    Review of Systems: Negative except as noted in the HPI.  Objective:  Marcus Johnston is a pleasant 89 y.o. male in NAD. AAO x 3.  Vascular Examination: Capillary refill time is 3-5 seconds to toes bilateral. Palpable pedal pulses b/l LE. Digital hair present b/l.  Skin temperature gradient WNL b/l. No varicosities b/l. No cyanosis noted b/l.   Dermatological Examination: Pedal skin with normal turgor, texture and tone b/l. No open wounds. No interdigital macerations b/l. Toenails x10 are 3mm thick, discolored, dystrophic with  subungual debris. There is pain with compression of the nail plates.  They are elongated x10  Assessment/Plan: 1. Pain due to onychomycosis of toenails of both feet   2. Type 2 diabetes mellitus without complication, with long-term current use of insulin  (HCC)    The mycotic toenails were sharply debrided x10 with sterile nail nippers and a power debriding burr to decrease bulk/thickness and length.    Return in about 9 weeks (around 08/01/2024) for [facility].   Awanda CHARM Imperial, DPM, FACFAS [facility]     [address] [place], [place] [address]                Office [phone]  Fax [phone]

## 2024-06-07 ENCOUNTER — Encounter

## 2024-06-07 ENCOUNTER — Ambulatory Visit: Payer: Self-pay | Admitting: Internal Medicine

## 2024-06-08 ENCOUNTER — Encounter: Admitting: Internal Medicine

## 2024-06-09 ENCOUNTER — Other Ambulatory Visit: Payer: Self-pay | Admitting: Internal Medicine

## 2024-06-13 ENCOUNTER — Other Ambulatory Visit: Payer: Self-pay | Admitting: Sports Medicine

## 2024-06-13 ENCOUNTER — Encounter: Payer: Self-pay | Admitting: Sports Medicine

## 2024-06-13 ENCOUNTER — Ambulatory Visit: Admitting: Sports Medicine

## 2024-06-13 VITALS — BP 142/80 | HR 67 | Temp 98.1°F | Ht 75.0 in | Wt 214.8 lb

## 2024-06-13 DIAGNOSIS — M159 Polyosteoarthritis, unspecified: Secondary | ICD-10-CM

## 2024-06-13 DIAGNOSIS — N1831 Chronic kidney disease, stage 3a: Secondary | ICD-10-CM

## 2024-06-13 DIAGNOSIS — E1159 Type 2 diabetes mellitus with other circulatory complications: Secondary | ICD-10-CM | POA: Diagnosis not present

## 2024-06-13 DIAGNOSIS — I10 Essential (primary) hypertension: Secondary | ICD-10-CM | POA: Diagnosis not present

## 2024-06-13 DIAGNOSIS — Z23 Encounter for immunization: Secondary | ICD-10-CM | POA: Diagnosis not present

## 2024-06-13 MED ORDER — INSULIN LISPRO (1 UNIT DIAL) 100 UNIT/ML (KWIKPEN): 14.0000 [IU] | PEN_INJECTOR | Freq: Two times a day (BID) | SUBCUTANEOUS | 11 refills | Status: AC

## 2024-06-13 MED ORDER — INSULIN LISPRO PROT & LISPRO (50-50 MIX) 100 UNIT/ML ~~LOC~~ SUSP: 14.0000 [IU] | SUBCUTANEOUS | 0 refills | Status: DC

## 2024-06-13 NOTE — Progress Notes (Signed)
 Careteam: Patient Care Team: Jarold Medici, MD as PCP - General (Internal Medicine) Lonni Slain, MD as PCP - Cardiology (Cardiology) Caudill, Charmaine POUR, [facility] (Inactive) (Pharmacist) [facility] (Ophthalmology)  PLACE OF SERVICE:  [facility]  Advanced Directive information    Allergies  Allergen Reactions   Dapagliflozin Other (See Comments)   Isosorb Dinitrate-Hydralazine  Other (See Comments)    Severe hypotension episode after administering one dose of Bidil 20-37.5 mg on 09/25/2019.   Pneumococcal Vac Polyvalent Other (See Comments)    Other Reaction(s): hospitalized   Statins Other (See Comments)    Leg pain    No chief complaint on file.    Discussed the use of AI scribe software for clinical note transcription with the patient, who gave verbal consent to proceed.  History of Present Illness    Marcus Johnston is a 89 year old male who presents to establish care pt reports that he is fairly active, independent with ADLS and IADLS. Lives with his wife, has no concerns today.  He c/o intermittent achy pain in his lower legs, occurring only at night . No recent falls, though he tripped once on a rug without losing consciousness.  He has diabetes, checking his blood sugar twice daily with readings typically ranging from 110 to 150 mg/dL. He takes insulin , 14 units twice a day, metformin , 400 mg twice a day, and uses Trulicity weekly. His last A1c was 7.9%.  He has chronic kidney disease, and his most recent creatinine level was 1.43. No urinary symptoms such as burning or blood in the urine, and he has regular bowel movements.  He takes medications for hypertension, including amlodipine , Coreg , and hydrochlorothiazide . He also takes Cardura  for prostate health, which he has been on since 2002. He uses Allegra as needed for allergies.  No memory concerns, joint pains, chest pain, shortness of breath, dizziness, palpitations, or mood  disturbances. He reports good sleep.  Review of Systems:  Review of Systems  Constitutional:  Negative for chills and fever.  HENT:  Negative for congestion and sore throat.   Respiratory:  Negative for cough, sputum production and shortness of breath.   Cardiovascular:  Negative for chest pain, palpitations and leg swelling.  Gastrointestinal:  Negative for abdominal pain, heartburn and nausea.  Genitourinary:  Negative for dysuria, frequency and hematuria.  Musculoskeletal:  Positive for joint pain. Negative for falls and myalgias.  Neurological:  Negative for dizziness, sensory change and focal weakness.   Negative unless indicated in HPI.   Past Medical History:  Diagnosis Date   Atrial fibrillation (HCC)    Atypical atrial flutter (HCC) 10/17/2018   Diabetes mellitus without complication (HCC)    Diabetic retinopathy (HCC)    NPDR OU   High cholesterol    History of prostate cancer    HTN (hypertension)    Hypertensive retinopathy    OU   Renal insufficiency    Shingles    Past Surgical History:  Procedure Laterality Date   ATRIAL TACH ABLATION N/A 05/08/2019   Procedure: ATRIAL TACH ABLATION;  Surgeon: Waddell Danelle ORN, MD;  Location: [facility];  Service: Cardiovascular;  Laterality: N/A;   CATARACT EXTRACTION Bilateral    EYE SURGERY Bilateral    Cat Sx   prostate implant     Social History:   reports that he has never smoked. He has never used smokeless tobacco. He reports that he does not drink alcohol and does not use drugs.  Family History  Problem Relation Age of Onset   Cancer Mother     Medications: Patient's Medications  New Prescriptions   No medications on file  Previous Medications   ACETAMINOPHEN  (TYLENOL ) 325 MG TABLET    Take 650 mg by mouth every 6 (six) hours as needed for mild pain (pain score 1-3) or fever.   ACETAMINOPHEN  (TYLENOL ) 500 MG TABLET    Take 500 mg by mouth every 6 (six) hours as needed.   AMLODIPINE  (NORVASC ) 5 MG  TABLET    TAKE 1 TABLET BY MOUTH TWICE  DAILY   ASPIRIN  81 MG CHEWABLE TABLET       ATORVASTATIN  (LIPITOR) 20 MG TABLET    TAKE 1 TABLET BY MOUTH EVERY MONDAY, WEDNESDAY AND FRIDAY   CARVEDILOL  (COREG ) 12.5 MG TABLET    TAKE 1 TABLET BY MOUTH TWICE  DAILY WITH MEALS   CHOLECALCIFEROL  (VITAMIN D3) 50 MCG (2000 UT) CAPSULE    Take 1 capsule by mouth daily.   DAPAGLIFLOZIN PROPANEDIOL (FARXIGA) 5 MG TABS TABLET       DOXAZOSIN  (CARDURA ) 2 MG TABLET    TAKE 1 TABLET BY MOUTH DAILY   DULAGLUTIDE (TRULICITY) 0.75 MG/0.5ML SOPN    Inject 0.75 mg into the skin once a week.   FEXOFENADINE (ALLEGRA) 180 MG TABLET    Take 180 mg by mouth daily as needed (allergies.).    HYDROCHLOROTHIAZIDE  (HYDRODIURIL ) 25 MG TABLET    TAKE 1 TABLET BY MOUTH DAILY   INSULIN  LISPRO PROTAMINE-LISPRO (HUMALOG  50/50 MIX) (50-50) 100 UNIT/ML SUSP INJECTION    Inject 10-15 Units into the skin See admin instructions. Inject 25 units subcutaneously with breakfast and 22 units with supper   INSULIN  SYRINGE-NEEDLE U-100 (EASY TOUCH INSULIN  SYRINGE) 30G X 5/16 0.5 ML MISC    USE 3 TIMES DAILY   LOSARTAN  (COZAAR ) 100 MG TABLET    TAKE 1 TABLET BY MOUTH DAILY   METFORMIN  (GLUCOPHAGE -XR) 500 MG 24 HR TABLET    Take 1,000 mg by mouth 2 (two) times daily with a meal.    MICONAZOLE  NITRATE 2 % AERP    Spray between toes once daily.  Modified Medications   No medications on file  Discontinued Medications   No medications on file    Physical Exam: There were no vitals filed for this visit. There is no height or weight on file to calculate BMI. BP Readings from Last 3 Encounters:  05/26/23 122/60  05/26/23 122/60  12/16/22 133/76   Wt Readings from Last 3 Encounters:  11/16/23 212 lb (96.2 kg)  05/26/23 212 lb (96.2 kg)  05/26/23 212 lb 9.6 oz (96.4 kg)    Physical Exam Constitutional:      Appearance: Normal appearance.  HENT:     Head: Normocephalic and atraumatic.  Cardiovascular:     Rate and Rhythm: Normal rate and  regular rhythm.     Pulses: Normal pulses.     Heart sounds: Normal heart sounds.  Pulmonary:     Effort: No respiratory distress.     Breath sounds: No stridor. No wheezing or rales.  Abdominal:     General: Bowel sounds are normal. There is no distension.     Palpations: Abdomen is soft.     Tenderness: There is no abdominal tenderness. There is no right CVA tenderness or guarding.  Musculoskeletal:        General: No swelling.  Neurological:     Mental Status: He is alert. Mental status is at baseline.  Sensory: No sensory deficit.     Motor: No weakness.     Labs reviewed: Basic Metabolic Panel: No results for input(s): NA, K, CL, CO2, GLUCOSE, BUN, CREATININE, CALCIUM , MG, PHOS, TSH in the last 8760 hours. Liver Function Tests: No results for input(s): AST, ALT, ALKPHOS, BILITOT, PROT, ALBUMIN in the last 8760 hours. No results for input(s): LIPASE, AMYLASE in the last 8760 hours. No results for input(s): AMMONIA in the last 8760 hours. CBC: No results for input(s): WBC, NEUTROABS, HGB, HCT, MCV, PLT in the last 8760 hours. Lipid Panel: No results for input(s): CHOL, HDL, LDLCALC, TRIG, CHOLHDL, LDLDIRECT in the last 8760 hours. TSH: No results for input(s): TSH in the last 8760 hours. A1C: Lab Results  Component Value Date   HGBA1C 7.4 (H) 05/26/2023    Assessment and Plan Assessment & Plan   1. Stage 3a chronic kidney disease (HCC) (Primary)  Cr 1.43 had recent labs on 05/26/24  Avoid nephrotoxic meds   2. Primary hypertension  Cont with the same Avoid salty foods    Type 2 diabetes mellitus with other circulatory complications (HCC)  A1c 7.8  Monitor for hypoglycemia - insulin  lispro protamine-lispro (HUMALOG  50/50 MIX) (50-50) 100 UNIT/ML SUSP injection; Inject 0.14 mLs (14 Units total) into the skin See admin instructions. 14 units twice daily  Dispense: 10 mL; Refill: 0   Flu  vaccine need   - Flu vaccine HIGH DOSE PF(Fluzone Trivalent)   Generalized OA  Take tylenol  prn for pain  Cont with regular exercises 5 times / week for 30 min

## 2024-06-13 NOTE — Patient Instructions (Addendum)
 [facility] at [facility] Address: [address] [place], [place] [address] Phone: [phone]

## 2024-07-31 ENCOUNTER — Encounter: Payer: Self-pay | Admitting: Podiatry

## 2024-07-31 ENCOUNTER — Ambulatory Visit: Admitting: Podiatry

## 2024-07-31 DIAGNOSIS — Z794 Long term (current) use of insulin: Secondary | ICD-10-CM

## 2024-07-31 DIAGNOSIS — E119 Type 2 diabetes mellitus without complications: Secondary | ICD-10-CM

## 2024-07-31 DIAGNOSIS — M79674 Pain in right toe(s): Secondary | ICD-10-CM | POA: Diagnosis not present

## 2024-07-31 DIAGNOSIS — B351 Tinea unguium: Secondary | ICD-10-CM | POA: Diagnosis not present

## 2024-07-31 DIAGNOSIS — M79675 Pain in left toe(s): Secondary | ICD-10-CM

## 2024-07-31 NOTE — Progress Notes (Signed)
This patient returns to my office for at risk foot care.  This patient requires this care by a professional since this patient will be at risk due to having diabetes and CKD.  This patient is unable to cut nails himself since the patient cannot reach his nails.These nails are painful walking and wearing shoes.  This patient presents for at risk foot care today.  General Appearance  Alert, conversant and in no acute stress.  Vascular  Dorsalis pedis and posterior tibial  pulses are weakly palpable  bilaterally.  Capillary return is within normal limits  bilaterally. Temperature is within normal limits  bilaterally.  Neurologic  Senn-Weinstein monofilament wire test within normal limits  bilaterally. Muscle power within normal limits bilaterally.  Nails Thick disfigured discolored nails with subungual debris  from hallux to fifth toes bilaterally. No evidence of bacterial infection or drainage bilaterally.  Orthopedic  No limitations of motion  feet .  No crepitus or effusions noted.  No bony pathology or digital deformities noted.  Skin  normotropic skin with no porokeratosis noted bilaterally.  No signs of infections or ulcers noted.     Onychomycosis  Pain in right toes  Pain in left toes  Consent was obtained for treatment procedures.   Mechanical debridement of nails 1-5  bilaterally performed with a nail nipper.  Filed with dremel without incident.    Return office visit    9 weeks                  Told patient to return for periodic foot care and evaluation due to potential at risk complications.   Gardiner Barefoot DPM

## 2024-08-01 ENCOUNTER — Ambulatory Visit: Admitting: Podiatry

## 2024-08-25 ENCOUNTER — Other Ambulatory Visit: Payer: Self-pay | Admitting: Sports Medicine

## 2024-08-25 DIAGNOSIS — E1159 Type 2 diabetes mellitus with other circulatory complications: Secondary | ICD-10-CM

## 2024-09-01 ENCOUNTER — Other Ambulatory Visit: Payer: Self-pay | Admitting: Internal Medicine

## 2024-09-01 NOTE — Patient Instructions (Signed)

## 2024-09-04 ENCOUNTER — Ambulatory Visit: Admitting: Sports Medicine

## 2024-09-04 ENCOUNTER — Encounter: Payer: Self-pay | Admitting: Sports Medicine

## 2024-09-04 VITALS — BP 147/82 | HR 70 | Temp 97.7°F | Ht 75.0 in | Wt 207.8 lb

## 2024-09-04 DIAGNOSIS — E569 Vitamin deficiency, unspecified: Secondary | ICD-10-CM

## 2024-09-04 DIAGNOSIS — Z8673 Personal history of transient ischemic attack (TIA), and cerebral infarction without residual deficits: Secondary | ICD-10-CM

## 2024-09-04 DIAGNOSIS — N1831 Chronic kidney disease, stage 3a: Secondary | ICD-10-CM

## 2024-09-04 DIAGNOSIS — E1159 Type 2 diabetes mellitus with other circulatory complications: Secondary | ICD-10-CM | POA: Diagnosis not present

## 2024-09-04 DIAGNOSIS — I10 Essential (primary) hypertension: Secondary | ICD-10-CM

## 2024-09-04 DIAGNOSIS — Z7984 Long term (current) use of oral hypoglycemic drugs: Secondary | ICD-10-CM

## 2024-09-04 DIAGNOSIS — E113313 Type 2 diabetes mellitus with moderate nonproliferative diabetic retinopathy with macular edema, bilateral: Secondary | ICD-10-CM

## 2024-09-04 LAB — POCT GLYCOSYLATED HEMOGLOBIN (HGB A1C)
HbA1c POC (<> result, manual entry): 7.8 %
HbA1c, POC (controlled diabetic range): 7.8 % — AB (ref 0.0–7.0)
HbA1c, POC (prediabetic range): 7.8 % — AB (ref 5.7–6.4)
Hemoglobin A1C: 7.8 % — AB (ref 4.0–5.6)

## 2024-09-04 MED ORDER — ATORVASTATIN CALCIUM 20 MG PO TABS: 20.0000 mg | ORAL_TABLET | Freq: Every day | ORAL | 2 refills | Status: AC

## 2024-09-04 MED ORDER — AMLODIPINE BESYLATE 10 MG PO TABS: 10.0000 mg | ORAL_TABLET | Freq: Every day | ORAL | 1 refills | Status: AC

## 2024-09-04 NOTE — Assessment & Plan Note (Addendum)
 Lab Results  Component Value Date   HGBA1C 7.8 (A) 09/04/2024   HGBA1C 7.8 09/04/2024   HGBA1C 7.8 (A) 09/04/2024   HGBA1C 7.8 (A) 09/04/2024   Exercise regularly Monitor for hypoglycemia Cont with metformin , jardiance, Trulicity, humalog  Orders:   POCT glycosylated hemoglobin (Hb A1C)   Lipid Profile; Future

## 2024-09-04 NOTE — Assessment & Plan Note (Signed)
 Follow up with ophthalomology

## 2024-09-04 NOTE — Progress Notes (Signed)
 "  Careteam: Patient Care Team: Sherlynn Madden, MD as PCP - General (Internal Medicine) Lonni Slain, MD as PCP - Cardiology (Cardiology) Caudill, Charmaine POUR, [facility] (Inactive) (Pharmacist) [facility] (Ophthalmology)  Allergies[1]  Chief Complaint  Patient presents with   Establish Care    Transfer of care and 3 month follow up.    Discussed the use of AI scribe software for clinical note transcription with the patient, who gave verbal consent to proceed.  History of Present Illness  Marcus Johnston is a 89 year old male with a history of stroke and diabetes who presents with concerns about medication management and recent cold symptoms.  He has been experiencing symptoms of a cold since the holidays, including a sore throat, cough, and congestion. These symptoms have been improving with medication, and he no longer has a sore throat, with only occasional cough and no longer producing phlegm. No fever, chills, or breathing problems.  He is currently taking atorvastatin  every other day (Monday, Wednesday, and Friday) but is unsure about the appropriate dosage. He also takes aspirin . He has  h/o  chronic right thalamic lacunar infarct. No recent falls, memory issues, or mood disturbances.  He manages his diabetes with insulin  (14 units twice a day), Trulicity once a week, and Jardiance three times a week due to previous side effects when taken daily. He also takes metformin . His A1c is reported to be 7.8, which has been fluctuating recently. No depression, anxiety, or memory issues.  He has a history of high cholesterol and is concerned about his cholesterol levels, although recent tests have not shown concerning numbers.  He takes multiple medications for blood pressure management, including Coreg , hydrochlorothiazide , losartan , and amlodipine , which he takes twice a day. He also takes doxazosin  for urinary stream issues, which is effective. He  experiences lightheadedness upon standing in the morning, which resolves after sitting for a short period. He drinks about two glasses of water with each meal, noting that one of his blood pressure medications is a diuretic. No urinary burning, blood in urine or stool, or joint pains. He reports occasional leg pain at night but none during the day. He experiences increased urination frequency but only wakes once at night to urinate.  He has a history of pneumonia vaccination but reports an adverse reaction leading to hospitalization, and does not want to take it. He is active, going to the [facility] three times a week, and lives independently with his spouse.    Review of Systems:  Review of Systems  Constitutional:  Negative for chills and fever.  HENT:  Negative for congestion and sore throat.   Eyes:  Negative for double vision.  Respiratory:  Negative for cough, sputum production and shortness of breath.   Cardiovascular:  Negative for chest pain, palpitations and leg swelling.  Gastrointestinal:  Negative for abdominal pain, heartburn and nausea.  Genitourinary:  Negative for dysuria, frequency and hematuria.  Musculoskeletal:  Negative for falls and myalgias.  Neurological:  Negative for dizziness, sensory change and focal weakness.   Negative unless indicated in HPI.   Patient Active Problem List   Diagnosis Date Noted   Muscle strain 05/26/2023   Acne comedone 05/26/2023   Moderate nonproliferative diabetic retinopathy of both eyes with macular edema associated with type 2 diabetes mellitus (HCC) 11/02/2022   Chronic kidney disease, stage 3 unspecified (HCC) 09/02/2021   Hyperglycemia due to type 2 diabetes mellitus (HCC) 09/02/2021   Long term (current) use of insulin  (  HCC) 09/02/2021   Obesity 10/21/2020   Personal history of malignant neoplasm of prostate 10/21/2020   Pure hypercholesterolemia 12/26/2019   Malignant HTN with heart disease, w/o CHF, with chronic kidney disease  12/19/2019   Hypertensive crisis 12/18/2019   Diabetic retinopathy (HCC)    Hypotension due to drugs 09/26/2019   Groin swelling 05/17/2019   Bruit (arterial) 05/17/2019   SVT (supraventricular tachycardia) 05/08/2019   Paroxysmal atrial tachycardia 05/02/2019   Bradycardia 04/30/2019   Syncope 04/30/2019   Syncope and collapse 04/29/2019   Leg edema 10/17/2018   Type 2 diabetes mellitus with other circulatory complications (HCC) 09/12/2018   Hyperglycemia    Essential hypertension 11/12/2016   Dyslipidemia 11/12/2016   DM (diabetes mellitus) (HCC) 11/12/2016   Renal insufficiency 11/12/2016   Past Medical History:  Diagnosis Date   Atrial fibrillation (HCC)    Atypical atrial flutter (HCC) 10/17/2018   Diabetes mellitus without complication (HCC)    Diabetic retinopathy (HCC)    NPDR OU   High cholesterol    History of prostate cancer    HTN (hypertension)    Hypertensive retinopathy    OU   Renal insufficiency    Shingles    Past Surgical History:  Procedure Laterality Date   ATRIAL TACH ABLATION N/A 05/08/2019   Procedure: ATRIAL TACH ABLATION;  Surgeon: Waddell Danelle ORN, MD;  Location: [facility] INV[facility] CV [facility];  Service: Cardiovascular;  Laterality: N/A;   CATARACT EXTRACTION Bilateral    EYE SURGERY Bilateral    Cat Sx   prostate implant     Social History[2] Family History  Problem Relation Age of Onset   Cancer Mother    Allergies[3]  Medications: Patient's Medications  New Prescriptions   AMLODIPINE  (NORVASC ) 10 MG TABLET    Take 1 tablet (10 mg total) by mouth daily.  Previous Medications   ACETAMINOPHEN  (TYLENOL ) 325 MG TABLET    Take 650 mg by mouth every 6 (six) hours as needed for mild pain (pain score 1-3) or fever.   ACETAMINOPHEN  (TYLENOL ) 500 MG TABLET    Take 500 mg by mouth every 6 (six) hours as needed.   ASPIRIN  81 MG CHEWABLE TABLET       CARVEDILOL  (COREG ) 12.5 MG TABLET    TAKE 1 TABLET BY MOUTH TWICE  DAILY WITH MEALS   CHOLECALCIFEROL   (VITAMIN D3) 50 MCG (2000 UT) CAPSULE    Take 1 capsule by mouth daily.   DOXAZOSIN  (CARDURA ) 2 MG TABLET    TAKE 1 TABLET BY MOUTH DAILY   DULAGLUTIDE (TRULICITY) 0.75 MG/0.5ML SOPN    Inject 0.75 mg into the skin once a week.   EMBECTA PEN NEEDLE NANO 2 GEN 32G X 4 MM MISC       FEXOFENADINE (ALLEGRA) 180 MG TABLET    Take 180 mg by mouth daily as needed (allergies.).    HYDROCHLOROTHIAZIDE  (HYDRODIURIL ) 25 MG TABLET    TAKE 1 TABLET BY MOUTH DAILY   INSULIN  LISPRO (HUMALOG  KWIKPEN) 100 UNIT/ML KWIKPEN    Inject 14 Units into the skin in the morning and at bedtime. Inject 14 units subcutaneously twice daily.   INSULIN  LISPRO PROT & LISPRO (HUMALOG  MIX 50/50 KWIKPEN) (50-50) 100 UNIT/ML KWIKPEN    INJECT 14 UNITS SUBCUTANEOUSLY TWICE DAILY   JARDIANCE 10 MG TABS TABLET    1 tablet Orally Three days a week; Duration: 90 days   LOSARTAN  (COZAAR ) 100 MG TABLET    TAKE 1 TABLET BY MOUTH DAILY   METFORMIN  (GLUCOPHAGE -XR) 500  MG 24 HR TABLET    Take 1,000 mg by mouth 2 (two) times daily with a meal.   Modified Medications   Modified Medication Previous Medication   ATORVASTATIN  (LIPITOR) 20 MG TABLET atorvastatin  (LIPITOR) 20 MG tablet      Take 1 tablet (20 mg total) by mouth at bedtime.    TAKE 1 TABLET BY MOUTH EVERY MONDAY, WEDNESDAY AND FRIDAY  Discontinued Medications   AMLODIPINE  (NORVASC ) 5 MG TABLET    TAKE 1 TABLET BY MOUTH TWICE  DAILY    Physical Exam: Vitals:   09/04/24 0949 09/04/24 0955  BP: (!) 160/80 (!) 147/82  Pulse: 70   Temp: 97.7 F (36.5 C)   TempSrc: Oral   SpO2: 98%   Weight: 207 lb 12.8 oz (94.3 kg)   Height: 6' 3 (1.905 m)    Body mass index is 25.97 kg/m. BP Readings from Last 3 Encounters:  09/04/24 (!) 147/82  06/13/24 (!) 142/80  05/26/23 122/60   Wt Readings from Last 3 Encounters:  09/04/24 207 lb 12.8 oz (94.3 kg)  06/13/24 214 lb 12.8 oz (97.4 kg)  11/16/23 212 lb (96.2 kg)    Physical Exam Constitutional:      Appearance: Normal  appearance.  HENT:     Head: Normocephalic and atraumatic.  Cardiovascular:     Rate and Rhythm: Normal rate and regular rhythm.     Pulses: Normal pulses.     Heart sounds: Normal heart sounds.  Pulmonary:     Effort: No respiratory distress.     Breath sounds: No stridor. No wheezing or rales.  Abdominal:     General: Bowel sounds are normal. There is no distension.     Palpations: Abdomen is soft.     Tenderness: There is no abdominal tenderness. There is no guarding.  Musculoskeletal:        General: No swelling.  Neurological:     Mental Status: He is alert. Mental status is at baseline.     Sensory: No sensory deficit.     Motor: No weakness.     Labs reviewed: Basic Metabolic Panel: No results for input(s): NA, K, CL, CO2, GLUCOSE, BUN, CREATININE, CALCIUM , MG, PHOS, TSH in the last 8760 hours. Liver Function Tests: No results for input(s): AST, ALT, ALKPHOS, BILITOT, PROT, ALBUMIN in the last 8760 hours. No results for input(s): LIPASE, AMYLASE in the last 8760 hours. No results for input(s): AMMONIA in the last 8760 hours. CBC: No results for input(s): WBC, NEUTROABS, HGB, HCT, MCV, PLT in the last 8760 hours. Lipid Panel: No results for input(s): CHOL, HDL, LDLCALC, TRIG, CHOLHDL, LDLDIRECT in the last 8760 hours. TSH: No results for input(s): TSH in the last 8760 hours. A1C: Lab Results  Component Value Date   HGBA1C 7.8 (A) 09/04/2024   HGBA1C 7.8 09/04/2024   HGBA1C 7.8 (A) 09/04/2024   HGBA1C 7.8 (A) 09/04/2024    Assessment & Plan Type 2 diabetes mellitus with other circulatory complications (HCC) Lab Results  Component Value Date   HGBA1C 7.8 (A) 09/04/2024   HGBA1C 7.8 09/04/2024   HGBA1C 7.8 (A) 09/04/2024   HGBA1C 7.8 (A) 09/04/2024   Exercise regularly Monitor for hypoglycemia Cont with metformin , jardiance, Trulicity, humalog  Orders:   POCT glycosylated hemoglobin (Hb A1C)    Lipid Profile; Future  Primary hypertension Repeat bp improved Monitor bp daily and keep a log Avoid salty foods Exercise regularly  Orders:   CBC with Differential/Platelet; Future   Basic Metabolic Panel (BMET);  Future   amLODipine  (NORVASC ) 10 MG tablet; Take 1 tablet (10 mg total) by mouth daily.  H/O: CVA (cerebrovascular accident) Cont with lipitor, aspirin  Orders:   Lipid Profile; Future  Vitamin deficiency  Orders:   VITAMIN D  25 Hydroxy (Vit-D Deficiency, Fractures); Future  Stage 3a chronic kidney disease (HCC) Avoid nephrotoxic meds    Moderate nonproliferative diabetic retinopathy of both eyes with macular edema associated with type 2 diabetes mellitus (HCC) Follow up with ophthalomology     Return in about 3 months (around 12/03/2024).BETHA Jackalyn Blazing     [1]  Allergies Allergen Reactions   Dapagliflozin Other (See Comments)   Isosorb Dinitrate-Hydralazine  Other (See Comments)    Severe hypotension episode after administering one dose of Bidil 20-37.5 mg on 09/25/2019.   Pneumococcal Vac Polyvalent Other (See Comments)    Other Reaction(s): hospitalized   Statins Other (See Comments)    Leg pain  [2]  Social History Tobacco Use   Smoking status: Never   Smokeless tobacco: Never  Vaping Use   Vaping status: Never Used  Substance Use Topics   Alcohol use: No   Drug use: No  [3]  Allergies Allergen Reactions   Dapagliflozin Other (See Comments)   Isosorb Dinitrate-Hydralazine  Other (See Comments)    Severe hypotension episode after administering one dose of Bidil 20-37.5 mg on 09/25/2019.   Pneumococcal Vac Polyvalent Other (See Comments)    Other Reaction(s): hospitalized   Statins Other (See Comments)    Leg pain   "

## 2024-09-04 NOTE — Assessment & Plan Note (Addendum)
 Avoid nephrotoxic meds

## 2024-09-06 ENCOUNTER — Other Ambulatory Visit

## 2024-09-06 DIAGNOSIS — E569 Vitamin deficiency, unspecified: Secondary | ICD-10-CM | POA: Diagnosis not present

## 2024-09-06 DIAGNOSIS — Z8673 Personal history of transient ischemic attack (TIA), and cerebral infarction without residual deficits: Secondary | ICD-10-CM

## 2024-09-06 DIAGNOSIS — E1159 Type 2 diabetes mellitus with other circulatory complications: Secondary | ICD-10-CM | POA: Diagnosis not present

## 2024-09-06 DIAGNOSIS — I10 Essential (primary) hypertension: Secondary | ICD-10-CM | POA: Diagnosis not present

## 2024-09-06 LAB — CBC WITH DIFFERENTIAL/PLATELET
Basophils Absolute: 0 K/uL (ref 0.0–0.1)
Basophils Relative: 0.4 % (ref 0.0–3.0)
Eosinophils Absolute: 0.1 K/uL (ref 0.0–0.7)
Eosinophils Relative: 2.6 % (ref 0.0–5.0)
HCT: 41.7 % (ref 39.0–52.0)
Hemoglobin: 13.6 g/dL (ref 13.0–17.0)
Lymphocytes Relative: 44.9 % (ref 12.0–46.0)
Lymphs Abs: 1.6 K/uL (ref 0.7–4.0)
MCHC: 32.7 g/dL (ref 30.0–36.0)
MCV: 84.1 fl (ref 78.0–100.0)
Monocytes Absolute: 0.4 K/uL (ref 0.1–1.0)
Monocytes Relative: 11.2 % (ref 3.0–12.0)
Neutro Abs: 1.5 K/uL (ref 1.4–7.7)
Neutrophils Relative %: 40.9 % — ABNORMAL LOW (ref 43.0–77.0)
Platelets: 186 K/uL (ref 150.0–400.0)
RBC: 4.96 Mil/uL (ref 4.22–5.81)
RDW: 15.6 % — ABNORMAL HIGH (ref 11.5–15.5)
WBC: 3.7 K/uL — ABNORMAL LOW (ref 4.0–10.5)

## 2024-09-06 LAB — BASIC METABOLIC PANEL WITH GFR
BUN: 25 mg/dL — ABNORMAL HIGH (ref 6–23)
CO2: 30 meq/L (ref 19–32)
Calcium: 9.7 mg/dL (ref 8.4–10.5)
Chloride: 103 meq/L (ref 96–112)
Creatinine, Ser: 1.4 mg/dL (ref 0.40–1.50)
GFR: 43.98 mL/min — ABNORMAL LOW
Glucose, Bld: 141 mg/dL — ABNORMAL HIGH (ref 70–99)
Potassium: 4.8 meq/L (ref 3.5–5.1)
Sodium: 139 meq/L (ref 135–145)

## 2024-09-06 LAB — LIPID PANEL
Cholesterol: 121 mg/dL (ref 28–200)
HDL: 64.8 mg/dL
LDL Cholesterol: 47 mg/dL (ref 10–99)
NonHDL: 55.79
Total CHOL/HDL Ratio: 2
Triglycerides: 44 mg/dL (ref 10.0–149.0)
VLDL: 8.8 mg/dL (ref 0.0–40.0)

## 2024-09-06 LAB — VITAMIN D 25 HYDROXY (VIT D DEFICIENCY, FRACTURES): VITD: 85.07 ng/mL (ref 30.00–100.00)

## 2024-09-06 NOTE — Addendum Note (Signed)
 Addended by: SEBASTIAN IP on: 09/06/2024 08:45 AM   Modules accepted: Orders

## 2024-09-07 ENCOUNTER — Ambulatory Visit: Payer: Self-pay | Admitting: Sports Medicine

## 2024-09-20 ENCOUNTER — Ambulatory Visit

## 2024-09-20 VITALS — Ht 75.0 in | Wt 207.0 lb

## 2024-09-20 DIAGNOSIS — Z0000 Encounter for general adult medical examination without abnormal findings: Secondary | ICD-10-CM

## 2024-09-20 NOTE — Progress Notes (Signed)
 "  Chief Complaint  Patient presents with   Medicare Wellness     Subjective:   Marcus Johnston is a 89 y.o. male who presents for a [employer] Visit.  Visit info / Clinical Intake: Medicare Wellness Visit Type:: Subsequent Annual Wellness Visit Persons participating in visit and providing information:: patient Medicare Wellness Visit Mode:: Video Since this visit was completed virtually, some vitals may be partially provided or unavailable. Missing vitals are due to the limitations of the virtual format.: Unable to obtain vitals - no equipment If Telephone or Video please confirm:: I connected with patient using audio/video enable telemedicine. I verified patient identity with two identifiers, discussed telehealth limitations, and patient agreed to proceed. Patient Location:: home Provider Location:: Office Interpreter Needed?: No Pre-visit prep was completed: yes AWV questionnaire completed by patient prior to visit?: yes Date:: 09/20/24 Living arrangements:: (Patient-Rptd) lives with spouse/significant other Patient's Overall Health Status Rating: (Patient-Rptd) very good Typical amount of pain: (Patient-Rptd) some Does pain affect daily life?: (Patient-Rptd) no Are you currently prescribed opioids?: no  Dietary Habits and Nutritional Risks How many meals a day?: (Patient-Rptd) 3 Eats fruit and vegetables daily?: (Patient-Rptd) yes Most meals are obtained by: (Patient-Rptd) preparing own meals In the last 2 weeks, have you had any of the following?: none Diabetic:: (!) yes Any non-healing wounds?: no How often do you check your BS?: 2 Would you like to be referred to a Nutritionist or for Diabetic Management? : no  Functional Status Activities of Daily Living (to include ambulation/medication): (Patient-Rptd) Independent Ambulation: Independent with device- listed below Home Assistive Devices/Equipment: Eyeglasses Medication Administration: (Patient-Rptd)  Independent Home Management (perform basic housework or laundry): (Patient-Rptd) Independent Manage your own finances?: (Patient-Rptd) yes Primary transportation is: (Patient-Rptd) driving Concerns about vision?: (Patient-Rptd) no *vision screening is required for WTM* Concerns about hearing?: (Patient-Rptd) no  Fall Screening Falls in the past year?: (Patient-Rptd) 1 Number of falls in past year: (Patient-Rptd) 0 Was there an injury with Fall?: (Patient-Rptd) 0 Fall Risk Category Calculator: (Patient-Rptd) 1 Patient Fall Risk Level: (Patient-Rptd) Low Fall Risk  Fall Risk Patient at Risk for Falls Due to: No Fall Risks Fall risk Follow up: Falls evaluation completed; Falls prevention discussed  Home and Transportation Safety: All rugs have non-skid backing?: (Patient-Rptd) yes All stairs or steps have railings?: (Patient-Rptd) yes Grab bars in the bathtub or shower?: (Patient-Rptd) yes Have non-skid surface in bathtub or shower?: (Patient-Rptd) yes Good home lighting?: (Patient-Rptd) yes Regular seat belt use?: (Patient-Rptd) yes Hospital stays in the last year:: (Patient-Rptd) no  Cognitive Assessment Difficulty concentrating, remembering, or making decisions? : (Patient-Rptd) yes Will 6CIT or Mini Cog be Completed: yes What year is it?: 0 points What month is it?: 0 points Give patient an address phrase to remember (5 components): [address] [place] what time is it?: 0 points Count backwards from 20 to 1: 0 points Say the months of the year in reverse: 0 points Repeat the address phrase from earlier: 2 points 6 CIT Score: 2 points  Advance Directives (For Healthcare) Does Patient Have a Medical Advance Directive?: No  Reviewed/Updated  Reviewed/Updated: Reviewed All (Medical, Surgical, Family, Medications, Allergies, Care Teams, Patient Goals)    Allergies (verified) Dapagliflozin, Isosorb dinitrate-hydralazine , Pneumococcal vac polyvalent, and Statins    Current Medications (verified) Outpatient Encounter Medications as of 09/20/2024  Medication Sig   acetaminophen  (TYLENOL ) 500 MG tablet Take 500 mg by mouth every 6 (six) hours as needed.   amLODipine  (NORVASC ) 10  MG tablet Take 1 tablet (10 mg total) by mouth daily.   aspirin  81 MG chewable tablet    atorvastatin  (LIPITOR) 20 MG tablet Take 1 tablet (20 mg total) by mouth at bedtime.   carvedilol  (COREG ) 12.5 MG tablet TAKE 1 TABLET BY MOUTH TWICE  DAILY WITH MEALS   doxazosin  (CARDURA ) 2 MG tablet TAKE 1 TABLET BY MOUTH DAILY   Dulaglutide (TRULICITY) 0.75 MG/0.5ML SOPN Inject 0.75 mg into the skin once a week.   EMBECTA PEN NEEDLE NANO 2 GEN 32G X 4 MM MISC    fexofenadine (ALLEGRA) 180 MG tablet Take 180 mg by mouth daily as needed (allergies.).    hydrochlorothiazide  (HYDRODIURIL ) 25 MG tablet TAKE 1 TABLET BY MOUTH DAILY   insulin  lispro (HUMALOG  KWIKPEN) 100 UNIT/ML KwikPen Inject 14 Units into the skin in the morning and at bedtime. Inject 14 units subcutaneously twice daily.   Insulin  Lispro Prot & Lispro (HUMALOG  MIX 50/50 KWIKPEN) (50-50) 100 UNIT/ML Kwikpen INJECT 14 UNITS SUBCUTANEOUSLY TWICE DAILY   JARDIANCE 10 MG TABS tablet 1 tablet Orally Three days a week; Duration: 90 days   losartan  (COZAAR ) 100 MG tablet TAKE 1 TABLET BY MOUTH DAILY   metFORMIN  (GLUCOPHAGE -XR) 500 MG 24 hr tablet Take 1,000 mg by mouth 2 (two) times daily with a meal.    acetaminophen  (TYLENOL ) 325 MG tablet Take 650 mg by mouth every 6 (six) hours as needed for mild pain (pain score 1-3) or fever.   Cholecalciferol  (VITAMIN D3) 50 MCG (2000 UT) capsule Take 1 capsule by mouth daily.   No facility-administered encounter medications on file as of 09/20/2024.    History: Past Medical History:  Diagnosis Date   Atrial fibrillation (HCC)    Atypical atrial flutter (HCC) 10/17/2018   Diabetes mellitus without complication (HCC)    Diabetic retinopathy (HCC)    NPDR OU   High cholesterol    History  of prostate cancer    HTN (hypertension)    Hypertensive retinopathy    OU   Renal insufficiency    Shingles    Past Surgical History:  Procedure Laterality Date   ATRIAL TACH ABLATION N/A 05/08/2019   Procedure: ATRIAL TACH ABLATION;  Surgeon: Waddell Danelle ORN, MD;  Location: [facility];  Service: Cardiovascular;  Laterality: N/A;   CATARACT EXTRACTION Bilateral    EYE SURGERY Bilateral    Cat Sx   prostate implant     Family History  Problem Relation Age of Onset   Cancer Mother    Social History   Occupational History   Occupation: retired  Tobacco Use   Smoking status: Never   Smokeless tobacco: Never  Vaping Use   Vaping status: Never Used  Substance and Sexual Activity   Alcohol use: No   Drug use: No   Sexual activity: Yes   Tobacco Counseling Counseling given: Not Answered  SDOH Screenings   Food Insecurity: Unknown (05/23/2024)  Housing: Unknown (05/23/2024)  Transportation Needs: No Transportation Needs (05/23/2024)  Utilities: Not At Risk (05/26/2023)  Alcohol Screen: Low Risk (05/26/2023)  Depression (PHQ2-9): Low Risk (09/20/2024)  Financial Resource Strain: Low Risk (05/23/2024)  Physical Activity: Sufficiently Active (09/20/2024)  Social Connections: Moderately Isolated (09/20/2024)  Stress: No Stress Concern Present (09/20/2024)  Tobacco Use: Low Risk (09/20/2024)  Health Literacy: Adequate Health Literacy (09/20/2024)   See flowsheets for full screening details  Depression Screen PHQ 2 & 9 Depression Scale- Over the past 2 weeks, how often have you been bothered by  any of the following problems? Little interest or pleasure in doing things: 0 Feeling down, depressed, or hopeless (PHQ Adolescent also includes...irritable): 0 PHQ-2 Total Score: 0 Trouble falling or staying asleep, or sleeping too much: 0 Feeling tired or having little energy: 0 Poor appetite or overeating (PHQ Adolescent also includes...weight loss): 0 Feeling bad about yourself - or  that you are a failure or have let yourself or your family down: 0 Trouble concentrating on things, such as reading the newspaper or watching television (PHQ Adolescent also includes...like school work): 0 Moving or speaking so slowly that other people could have noticed. Or the opposite - being so fidgety or restless that you have been moving around a lot more than usual: 0 Thoughts that you would be better off dead, or of hurting yourself in some way: 0 PHQ-9 Total Score: 0 If you checked off any problems, how difficult have these problems made it for you to do your work, take care of things at home, or get along with other people?: Not difficult at all     Goals Addressed             This Visit's Progress    Patient Stated   On track    05/26/2023, stay healthy   Same goal/2026             Objective:    Today's Vitals   09/20/24 1409  Weight: 207 lb (93.9 kg)  Height: 6' 3 (1.905 m)   Body mass index is 25.87 kg/m.  Hearing/Vision screen Hearing Screening - Comments:: Denies hearing difficulties   Vision Screening - Comments:: Wears eyeglasses/Dr. Ernestine Immunizations and Health Maintenance Health Maintenance  Topic Date Due   Zoster Vaccines- Shingrix (1 of 2) Never done   OPHTHALMOLOGY EXAM  10/18/2024 (Originally 11/02/2023)   Pneumococcal Vaccine: 50+ Years (1 of 2 - PCV) 09/04/2025 (Originally 04/22/1950)   COVID-19 Vaccine (5 - 2025-26 season) 09/27/2025 (Originally 04/17/2024)   FOOT EXAM  01/23/2025   HEMOGLOBIN A1C  03/04/2025   Medicare Annual Wellness (AWV)  09/20/2025   Influenza Vaccine  Completed   Meningococcal B Vaccine  Aged Out   DTaP/Tdap/Td  Discontinued        Assessment/Plan:  This is a routine wellness examination for Marcus Johnston.  Patient Care Team: Sherlynn Madden, MD as PCP - General (Internal Medicine) Lonni Slain, MD as PCP - Cardiology (Cardiology) Caudill, Charmaine POUR, [facility] (Inactive) (Pharmacist) [facility] (Ophthalmology)  I have personally reviewed and noted the following in the patients chart:   Medical and social history Use of alcohol, tobacco or illicit drugs  Current medications and supplements including opioid prescriptions. Functional ability and status Nutritional status Physical activity Advanced directives List of other physicians Hospitalizations, surgeries, and ER visits in previous 12 months Vitals Screenings to include cognitive, depression, and falls Referrals and appointments  No orders of the defined types were placed in this encounter.  In addition, I have reviewed and discussed with patient certain preventive protocols, quality metrics, and best practice recommendations. A written personalized care plan for preventive services as well as general preventive health recommendations were provided to patient.   Samora Jernberg L Mao Lockner, CMA   09/20/2024   Return in 1 year (on 09/20/2025).  After Visit Summary: (MyChart) Due to this being a telephonic visit, the after visit summary with patients personalized plan was offered to patient via MyChart   Nurse Notes: Vaccines not given: Pneumonia and the Shingrix vaccine declined today Already  had diabetic eye exam - will request records.  Patient stated that he has lost sense of taste x 6 months and wanted his provider to be aware of this.   "

## 2024-09-20 NOTE — Patient Instructions (Signed)
 Marcus Johnston,  Thank you for taking the time for your Medicare Wellness Visit. I appreciate your continued commitment to your health goals. Please review the care plan we discussed, and feel free to reach out if I can assist you further.  Please note that Annual Wellness Visits do not include a physical exam. Some assessments may be limited, especially if the visit was conducted virtually. If needed, we may recommend an in-person follow-up with your provider.  Ongoing Care Seeing your primary care provider every 3 to 6 months helps us  monitor your health and provide consistent, personalized care. Remember to get eye exam results sent over to provider after your eye appointment.  Keep up the good work.  Referrals If a referral was made during today's visit and you haven't received any updates within two weeks, please contact the referred provider directly to check on the status.  Recommended Screenings:  Health Maintenance  Topic Date Due   Zoster (Shingles) Vaccine (1 of 2) Never done   Eye exam for diabetics  10/18/2024*   Pneumococcal Vaccine for age over 81 (1 of 2 - PCV) 09/04/2025*   COVID-19 Vaccine (5 - 2025-26 season) 09/27/2025*   Complete foot exam   01/23/2025   Hemoglobin A1C  03/04/2025   Medicare Annual Wellness Visit  09/20/2025   Flu Shot  Completed   Meningitis B Vaccine  Aged Out   DTaP/Tdap/Td vaccine  Discontinued  *Topic was postponed. The date shown is not the original due date.       09/20/2024    1:25 PM  Advanced Directives  Does Patient Have a Medical Advance Directive? No    Vision: Annual vision screenings are recommended for early detection of glaucoma, cataracts, and diabetic retinopathy. These exams can also reveal signs of chronic conditions such as diabetes and high blood pressure.  Dental: Annual dental screenings help detect early signs of oral cancer, gum disease, and other conditions linked to overall health, including heart disease and  diabetes.  Please see the attached documents for additional preventive care recommendations.

## 2024-10-02 ENCOUNTER — Ambulatory Visit: Admitting: Podiatry

## 2024-10-04 ENCOUNTER — Ambulatory Visit: Admitting: Podiatry

## 2024-11-01 ENCOUNTER — Ambulatory Visit: Admitting: Podiatry

## 2024-12-06 ENCOUNTER — Ambulatory Visit: Admitting: Sports Medicine
